# Patient Record
Sex: Female | Born: 1955 | Race: White | Hispanic: No | Smoking: Former smoker
Health system: Southern US, Community
[De-identification: ages and names within clinical notes are randomized; demographics above are authoritative.]

## PROBLEM LIST (undated history)

## (undated) DIAGNOSIS — R7303 Prediabetes: Secondary | ICD-10-CM

## (undated) DIAGNOSIS — L309 Dermatitis, unspecified: Secondary | ICD-10-CM

## (undated) DIAGNOSIS — D510 Vitamin B12 deficiency anemia due to intrinsic factor deficiency: Secondary | ICD-10-CM

## (undated) DIAGNOSIS — Z87448 Personal history of other diseases of urinary system: Secondary | ICD-10-CM

## (undated) DIAGNOSIS — R5382 Chronic fatigue, unspecified: Secondary | ICD-10-CM

## (undated) DIAGNOSIS — G8929 Other chronic pain: Secondary | ICD-10-CM

## (undated) DIAGNOSIS — K219 Gastro-esophageal reflux disease without esophagitis: Secondary | ICD-10-CM

## (undated) DIAGNOSIS — K5730 Diverticulosis of large intestine without perforation or abscess without bleeding: Secondary | ICD-10-CM

## (undated) DIAGNOSIS — M4722 Other spondylosis with radiculopathy, cervical region: Secondary | ICD-10-CM

## (undated) DIAGNOSIS — I10 Essential (primary) hypertension: Secondary | ICD-10-CM

## (undated) DIAGNOSIS — Z803 Family history of malignant neoplasm of breast: Secondary | ICD-10-CM

## (undated) DIAGNOSIS — Z8673 Personal history of transient ischemic attack (TIA), and cerebral infarction without residual deficits: Secondary | ICD-10-CM

## (undated) DIAGNOSIS — Z8719 Personal history of other diseases of the digestive system: Secondary | ICD-10-CM

## (undated) DIAGNOSIS — Z8489 Family history of other specified conditions: Secondary | ICD-10-CM

## (undated) DIAGNOSIS — M797 Fibromyalgia: Secondary | ICD-10-CM

## (undated) DIAGNOSIS — G4733 Obstructive sleep apnea (adult) (pediatric): Secondary | ICD-10-CM

## (undated) DIAGNOSIS — M51379 Other intervertebral disc degeneration, lumbosacral region without mention of lumbar back pain or lower extremity pain: Secondary | ICD-10-CM

## (undated) DIAGNOSIS — R519 Headache, unspecified: Secondary | ICD-10-CM

## (undated) DIAGNOSIS — S46219A Strain of muscle, fascia and tendon of other parts of biceps, unspecified arm, initial encounter: Secondary | ICD-10-CM

## (undated) DIAGNOSIS — M75101 Unspecified rotator cuff tear or rupture of right shoulder, not specified as traumatic: Secondary | ICD-10-CM

## (undated) DIAGNOSIS — M1990 Unspecified osteoarthritis, unspecified site: Secondary | ICD-10-CM

## (undated) DIAGNOSIS — F339 Major depressive disorder, recurrent, unspecified: Secondary | ICD-10-CM

## (undated) DIAGNOSIS — K589 Irritable bowel syndrome without diarrhea: Secondary | ICD-10-CM

## (undated) DIAGNOSIS — G43019 Migraine without aura, intractable, without status migrainosus: Secondary | ICD-10-CM

## (undated) DIAGNOSIS — M5137 Other intervertebral disc degeneration, lumbosacral region: Secondary | ICD-10-CM

## (undated) DIAGNOSIS — E782 Mixed hyperlipidemia: Secondary | ICD-10-CM

## (undated) DIAGNOSIS — M5030 Other cervical disc degeneration, unspecified cervical region: Secondary | ICD-10-CM

## (undated) DIAGNOSIS — F419 Anxiety disorder, unspecified: Secondary | ICD-10-CM

## (undated) DIAGNOSIS — M47817 Spondylosis without myelopathy or radiculopathy, lumbosacral region: Secondary | ICD-10-CM

## (undated) DIAGNOSIS — R51 Headache: Secondary | ICD-10-CM

## (undated) DIAGNOSIS — J309 Allergic rhinitis, unspecified: Secondary | ICD-10-CM

## (undated) HISTORY — DX: Family history of malignant neoplasm of breast: Z80.3

## (undated) HISTORY — DX: Migraine without aura, intractable, without status migrainosus: G43.019

## (undated) HISTORY — DX: Fibromyalgia: M79.7

## (undated) HISTORY — DX: Essential (primary) hypertension: I10

## (undated) HISTORY — PX: BREAST SURGERY: SHX581

## (undated) HISTORY — PX: RADIAL KERATOTOMY: SHX217

## (undated) HISTORY — PX: COLONOSCOPY: SHX174

## (undated) HISTORY — PX: DILATATION & CURRETTAGE/HYSTEROSCOPY WITH RESECTOCOPE: SHX5572

## (undated) HISTORY — PX: RADIAL OPTIC NEUROTOMY: SHX2282

---

## 1999-12-04 ENCOUNTER — Other Ambulatory Visit: Admission: RE | Admit: 1999-12-04 | Discharge: 1999-12-04 | Payer: Self-pay | Admitting: *Deleted

## 2000-02-04 ENCOUNTER — Other Ambulatory Visit: Admission: RE | Admit: 2000-02-04 | Discharge: 2000-02-04 | Payer: Self-pay | Admitting: *Deleted

## 2001-04-22 ENCOUNTER — Other Ambulatory Visit: Admission: RE | Admit: 2001-04-22 | Discharge: 2001-04-22 | Payer: Self-pay | Admitting: *Deleted

## 2002-05-25 ENCOUNTER — Other Ambulatory Visit: Admission: RE | Admit: 2002-05-25 | Discharge: 2002-05-25 | Payer: Self-pay | Admitting: *Deleted

## 2002-08-07 ENCOUNTER — Ambulatory Visit (HOSPITAL_BASED_OUTPATIENT_CLINIC_OR_DEPARTMENT_OTHER): Admission: RE | Admit: 2002-08-07 | Discharge: 2002-08-07 | Payer: Self-pay | Admitting: Internal Medicine

## 2003-07-20 ENCOUNTER — Other Ambulatory Visit: Admission: RE | Admit: 2003-07-20 | Discharge: 2003-07-20 | Payer: Self-pay | Admitting: *Deleted

## 2003-10-21 ENCOUNTER — Encounter: Admission: RE | Admit: 2003-10-21 | Discharge: 2003-10-21 | Payer: Self-pay | Admitting: Neurosurgery

## 2004-08-29 ENCOUNTER — Encounter: Payer: Self-pay | Admitting: Internal Medicine

## 2004-10-09 ENCOUNTER — Ambulatory Visit: Payer: Self-pay | Admitting: Internal Medicine

## 2004-10-10 ENCOUNTER — Ambulatory Visit: Payer: Self-pay | Admitting: Internal Medicine

## 2004-10-16 ENCOUNTER — Ambulatory Visit: Payer: Self-pay | Admitting: Internal Medicine

## 2004-10-29 ENCOUNTER — Ambulatory Visit: Payer: Self-pay | Admitting: Internal Medicine

## 2005-05-30 ENCOUNTER — Encounter: Payer: Self-pay | Admitting: Internal Medicine

## 2005-06-06 ENCOUNTER — Ambulatory Visit: Payer: Self-pay | Admitting: Internal Medicine

## 2005-07-15 ENCOUNTER — Ambulatory Visit: Payer: Self-pay | Admitting: Internal Medicine

## 2005-07-31 ENCOUNTER — Ambulatory Visit: Payer: Self-pay | Admitting: Internal Medicine

## 2005-09-29 DIAGNOSIS — Z87448 Personal history of other diseases of urinary system: Secondary | ICD-10-CM

## 2005-09-29 HISTORY — DX: Personal history of other diseases of urinary system: Z87.448

## 2005-10-10 ENCOUNTER — Inpatient Hospital Stay (HOSPITAL_COMMUNITY): Admission: EM | Admit: 2005-10-10 | Discharge: 2005-10-14 | Payer: Self-pay | Admitting: Emergency Medicine

## 2005-10-13 ENCOUNTER — Ambulatory Visit: Payer: Self-pay | Admitting: Internal Medicine

## 2005-10-22 ENCOUNTER — Ambulatory Visit: Payer: Self-pay | Admitting: Internal Medicine

## 2005-11-01 ENCOUNTER — Ambulatory Visit: Payer: Self-pay | Admitting: Internal Medicine

## 2005-11-11 ENCOUNTER — Encounter: Payer: Self-pay | Admitting: Internal Medicine

## 2005-12-12 ENCOUNTER — Ambulatory Visit: Payer: Self-pay | Admitting: Internal Medicine

## 2005-12-17 ENCOUNTER — Encounter: Payer: Self-pay | Admitting: Internal Medicine

## 2006-01-20 ENCOUNTER — Ambulatory Visit: Payer: Self-pay | Admitting: Internal Medicine

## 2006-01-23 ENCOUNTER — Ambulatory Visit: Payer: Self-pay | Admitting: Internal Medicine

## 2006-02-13 ENCOUNTER — Ambulatory Visit: Payer: Self-pay | Admitting: Internal Medicine

## 2006-03-19 ENCOUNTER — Ambulatory Visit: Payer: Self-pay | Admitting: Internal Medicine

## 2006-04-16 ENCOUNTER — Ambulatory Visit: Payer: Self-pay | Admitting: Internal Medicine

## 2006-06-03 ENCOUNTER — Ambulatory Visit: Payer: Self-pay | Admitting: Internal Medicine

## 2006-06-09 ENCOUNTER — Ambulatory Visit: Payer: Self-pay | Admitting: Internal Medicine

## 2006-06-11 ENCOUNTER — Ambulatory Visit: Payer: Self-pay | Admitting: Cardiology

## 2006-06-19 ENCOUNTER — Ambulatory Visit: Payer: Self-pay | Admitting: Internal Medicine

## 2006-07-24 ENCOUNTER — Ambulatory Visit: Payer: Self-pay | Admitting: Internal Medicine

## 2006-07-24 LAB — CONVERTED CEMR LAB
BUN: 8 mg/dL (ref 6–23)
CO2: 31 meq/L (ref 19–32)
Calcium: 9.6 mg/dL (ref 8.4–10.5)
Chloride: 96 meq/L (ref 96–112)
Creatinine, Ser: 0.8 mg/dL (ref 0.4–1.2)
GFR calc non Af Amer: 80 mL/min
Glomerular Filtration Rate, Af Am: 97 mL/min/{1.73_m2}
Glucose, Bld: 92 mg/dL (ref 70–99)
Potassium: 4.1 meq/L (ref 3.5–5.1)
Sodium: 135 meq/L (ref 135–145)

## 2006-08-28 ENCOUNTER — Ambulatory Visit: Payer: Self-pay | Admitting: Internal Medicine

## 2006-08-28 LAB — CONVERTED CEMR LAB
BUN: 16 mg/dL (ref 6–23)
Basophils Absolute: 0 10*3/uL (ref 0.0–0.1)
Basophils Relative: 0.3 % (ref 0.0–1.0)
CO2: 33 meq/L — ABNORMAL HIGH (ref 19–32)
Calcium: 10.8 mg/dL — ABNORMAL HIGH (ref 8.4–10.5)
Chloride: 97 meq/L (ref 96–112)
Creatinine, Ser: 1 mg/dL (ref 0.4–1.2)
Eosinophil percent: 0.8 % (ref 0.0–5.0)
GFR calc non Af Amer: 62 mL/min
Glomerular Filtration Rate, Af Am: 75 mL/min/{1.73_m2}
Glucose, Bld: 76 mg/dL (ref 70–99)
HCT: 40.2 % (ref 36.0–46.0)
Hemoglobin: 13.7 g/dL (ref 12.0–15.0)
Lymphocytes Relative: 32.2 % (ref 12.0–46.0)
MCHC: 34 g/dL (ref 30.0–36.0)
MCV: 94.5 fL (ref 78.0–100.0)
Monocytes Absolute: 0.5 10*3/uL (ref 0.2–0.7)
Monocytes Relative: 7.8 % (ref 3.0–11.0)
Neutro Abs: 4.1 10*3/uL (ref 1.4–7.7)
Neutrophils Relative %: 58.9 % (ref 43.0–77.0)
Platelets: 454 10*3/uL — ABNORMAL HIGH (ref 150–400)
Potassium: 4.5 meq/L (ref 3.5–5.1)
RBC: 4.25 M/uL (ref 3.87–5.11)
RDW: 13 % (ref 11.5–14.6)
Sodium: 141 meq/L (ref 135–145)
WBC: 6.9 10*3/uL (ref 4.5–10.5)

## 2006-10-30 ENCOUNTER — Ambulatory Visit: Payer: Self-pay | Admitting: Internal Medicine

## 2006-10-30 LAB — CONVERTED CEMR LAB
BUN: 13 mg/dL (ref 6–23)
Basophils Absolute: 0 10*3/uL (ref 0.0–0.1)
Basophils Relative: 1 % (ref 0–1)
Creatinine, Ser: 0.75 mg/dL (ref 0.40–1.20)
Eosinophils Absolute: 0.1 10*3/uL (ref 0.0–0.7)
Eosinophils Relative: 1 % (ref 0–5)
HCT: 38.4 % (ref 36.0–46.0)
Hemoglobin: 12.1 g/dL (ref 12.0–15.0)
Lymphocytes Relative: 34 % (ref 12–46)
Lymphs Abs: 2.3 10*3/uL (ref 0.7–3.3)
MCHC: 31.5 g/dL (ref 30.0–36.0)
MCV: 95.5 fL (ref 78.0–100.0)
Monocytes Absolute: 0.5 10*3/uL (ref 0.2–0.7)
Monocytes Relative: 7 % (ref 3–11)
Neutro Abs: 3.8 10*3/uL (ref 1.7–7.7)
Neutrophils Relative %: 57 % (ref 43–77)
Platelets: 370 10*3/uL (ref 150–400)
Potassium: 4.9 meq/L (ref 3.5–5.3)
RBC: 4.02 M/uL (ref 3.87–5.11)
RDW: 13.5 % (ref 11.5–14.0)
Sodium: 141 meq/L (ref 135–145)
TSH: 1.682 microintl units/mL (ref 0.350–5.50)
WBC: 6.7 10*3/uL (ref 4.0–10.5)

## 2007-01-11 ENCOUNTER — Ambulatory Visit: Payer: Self-pay | Admitting: Internal Medicine

## 2007-03-18 ENCOUNTER — Ambulatory Visit: Payer: Self-pay | Admitting: Internal Medicine

## 2007-03-22 ENCOUNTER — Encounter: Admission: RE | Admit: 2007-03-22 | Discharge: 2007-03-22 | Payer: Self-pay | Admitting: Internal Medicine

## 2007-03-29 DIAGNOSIS — IMO0001 Reserved for inherently not codable concepts without codable children: Secondary | ICD-10-CM

## 2007-03-29 DIAGNOSIS — M797 Fibromyalgia: Secondary | ICD-10-CM

## 2007-03-29 DIAGNOSIS — J309 Allergic rhinitis, unspecified: Secondary | ICD-10-CM

## 2007-03-29 HISTORY — DX: Fibromyalgia: M79.7

## 2007-03-29 HISTORY — DX: Reserved for inherently not codable concepts without codable children: IMO0001

## 2007-03-29 HISTORY — DX: Allergic rhinitis, unspecified: J30.9

## 2007-05-18 ENCOUNTER — Ambulatory Visit: Payer: Self-pay | Admitting: Internal Medicine

## 2007-05-18 DIAGNOSIS — M5030 Other cervical disc degeneration, unspecified cervical region: Secondary | ICD-10-CM

## 2007-05-18 DIAGNOSIS — D518 Other vitamin B12 deficiency anemias: Secondary | ICD-10-CM

## 2007-05-18 HISTORY — DX: Other vitamin B12 deficiency anemias: D51.8

## 2007-05-18 HISTORY — DX: Other cervical disc degeneration, unspecified cervical region: M50.30

## 2007-05-18 LAB — CONVERTED CEMR LAB
Basophils Absolute: 0 10*3/uL (ref 0.0–0.1)
Basophils Relative: 0.2 % (ref 0.0–1.0)
Eosinophils Absolute: 0.2 10*3/uL (ref 0.0–0.6)
Eosinophils Relative: 2.6 % (ref 0.0–5.0)
HCT: 36.1 % (ref 36.0–46.0)
Hemoglobin: 12.5 g/dL (ref 12.0–15.0)
INR: 0.9 (ref 0.0–1.5)
Lymphocytes Relative: 30.8 % (ref 12.0–46.0)
MCHC: 34.7 g/dL (ref 30.0–36.0)
MCV: 95 fL (ref 78.0–100.0)
Monocytes Absolute: 0.4 10*3/uL (ref 0.2–0.7)
Monocytes Relative: 6.2 % (ref 3.0–11.0)
Neutro Abs: 4.4 10*3/uL (ref 1.4–7.7)
Neutrophils Relative %: 60.2 % (ref 43.0–77.0)
Platelets: 377 10*3/uL (ref 150–400)
Prothrombin Time: 12.5 s (ref 11.6–15.2)
RBC: 3.8 M/uL — ABNORMAL LOW (ref 3.87–5.11)
RDW: 13 % (ref 11.5–14.6)
WBC: 7.2 10*3/uL (ref 4.5–10.5)
aPTT: 27 s (ref 24–37)

## 2007-05-19 ENCOUNTER — Encounter: Payer: Self-pay | Admitting: Internal Medicine

## 2007-05-21 ENCOUNTER — Encounter: Payer: Self-pay | Admitting: Internal Medicine

## 2007-06-10 ENCOUNTER — Telehealth (INDEPENDENT_AMBULATORY_CARE_PROVIDER_SITE_OTHER): Payer: Self-pay | Admitting: *Deleted

## 2007-07-19 ENCOUNTER — Telehealth: Payer: Self-pay | Admitting: Internal Medicine

## 2007-07-20 ENCOUNTER — Encounter: Payer: Self-pay | Admitting: Internal Medicine

## 2007-08-31 ENCOUNTER — Encounter: Payer: Self-pay | Admitting: Internal Medicine

## 2007-09-01 ENCOUNTER — Encounter: Payer: Self-pay | Admitting: Internal Medicine

## 2007-09-14 ENCOUNTER — Ambulatory Visit: Payer: Self-pay | Admitting: Internal Medicine

## 2007-09-14 DIAGNOSIS — G4730 Sleep apnea, unspecified: Secondary | ICD-10-CM

## 2007-09-14 DIAGNOSIS — I10 Essential (primary) hypertension: Secondary | ICD-10-CM | POA: Insufficient documentation

## 2007-09-14 DIAGNOSIS — G4700 Insomnia, unspecified: Secondary | ICD-10-CM | POA: Insufficient documentation

## 2007-09-14 HISTORY — DX: Essential (primary) hypertension: I10

## 2007-09-14 LAB — CONVERTED CEMR LAB
BUN: 11 mg/dL (ref 6–23)
Basophils Absolute: 0 10*3/uL (ref 0.0–0.1)
Basophils Relative: 0.7 % (ref 0.0–1.0)
CO2: 32 meq/L (ref 19–32)
Calcium: 11.7 mg/dL — ABNORMAL HIGH (ref 8.4–10.5)
Chloride: 97 meq/L (ref 96–112)
Cholesterol: 217 mg/dL (ref 0–200)
Creatinine, Ser: 0.7 mg/dL (ref 0.4–1.2)
Direct LDL: 122.8 mg/dL
Eosinophils Absolute: 0.2 10*3/uL (ref 0.0–0.6)
Eosinophils Relative: 3 % (ref 0.0–5.0)
Folate: 18.1 ng/mL
GFR calc Af Amer: 113 mL/min
GFR calc non Af Amer: 93 mL/min
Glucose, Bld: 93 mg/dL (ref 70–99)
HCT: 37.4 % (ref 36.0–46.0)
HDL: 71.7 mg/dL (ref >39.0)
Hemoglobin: 12.9 g/dL (ref 12.0–15.0)
Lymphocytes Relative: 40.8 % (ref 12.0–46.0)
MCHC: 34.4 g/dL (ref 30.0–36.0)
MCV: 94.8 fL (ref 78.0–100.0)
Monocytes Absolute: 0.5 10*3/uL (ref 0.2–0.7)
Monocytes Relative: 7.4 % (ref 3.0–11.0)
Neutro Abs: 3.5 10*3/uL (ref 1.4–7.7)
Neutrophils Relative %: 48.1 % (ref 43.0–77.0)
Platelets: 380 10*3/uL (ref 150–400)
Potassium: 5 meq/L (ref 3.5–5.1)
RBC: 3.94 M/uL (ref 3.87–5.11)
RDW: 12.5 % (ref 11.5–14.6)
Sodium: 140 meq/L (ref 135–145)
Total CHOL/HDL Ratio: 3
Triglycerides: 201 mg/dL (ref 0–149)
VLDL: 40 mg/dL (ref 0–40)
Vitamin B-12: 925 pg/mL — ABNORMAL HIGH (ref 211–911)
WBC: 7.1 10*3/uL (ref 4.5–10.5)

## 2007-11-03 ENCOUNTER — Telehealth: Payer: Self-pay | Admitting: Internal Medicine

## 2007-11-18 ENCOUNTER — Ambulatory Visit: Payer: Self-pay | Admitting: Internal Medicine

## 2007-11-18 DIAGNOSIS — E785 Hyperlipidemia, unspecified: Secondary | ICD-10-CM

## 2007-11-18 HISTORY — DX: Hypercalcemia: E83.52

## 2007-11-19 ENCOUNTER — Encounter: Payer: Self-pay | Admitting: Internal Medicine

## 2007-11-23 ENCOUNTER — Encounter: Payer: Self-pay | Admitting: Internal Medicine

## 2007-11-23 LAB — CONVERTED CEMR LAB
Albumin, U: DETECTED %
Alpha 1, Urine: DETECTED % — AB
Alpha 2, Urine: DETECTED % — AB
Beta, Urine: DETECTED % — AB
Free Kappa Lt Chains,Ur: 0.22 mg/dL (ref 0.04–1.51)
Free Lambda Lt Chains,Ur: <0.07 mg/dL (ref 0.08–1.01)
Gamma Globulin, Urine: DETECTED % — AB
Time: 24
Total Protein, Urine-Ur/day: 93 mg/24hr (ref 10–140)
Volume, Urine: 3000 mL

## 2007-11-25 ENCOUNTER — Encounter: Payer: Self-pay | Admitting: Internal Medicine

## 2007-11-25 LAB — CONVERTED CEMR LAB
Albumin ELP: 63.3 % (ref 55.8–66.1)
Alpha-1-Globulin: 3.1 % (ref 2.9–4.9)
Alpha-2-Globulin: 10.4 % (ref 7.1–11.8)
Beta Globulin: 6.2 % (ref 4.7–7.2)
Calcium, Total (PTH): 10.8 mg/dL — ABNORMAL HIGH (ref 8.4–10.5)
Cortisol, Plasma: 11.5 ug/dL
Gamma Globulin: 12.9 % (ref 11.1–18.8)
PTH: 46.7 pg/mL (ref 14.0–72.0)
Total Protein, Serum Electrophoresis: 8.4 g/dL — ABNORMAL HIGH (ref 6.0–8.3)

## 2007-12-16 ENCOUNTER — Encounter: Admission: RE | Admit: 2007-12-16 | Discharge: 2007-12-16 | Payer: Self-pay | Admitting: Internal Medicine

## 2007-12-21 ENCOUNTER — Telehealth: Payer: Self-pay | Admitting: Internal Medicine

## 2007-12-21 ENCOUNTER — Ambulatory Visit: Payer: Self-pay | Admitting: Internal Medicine

## 2007-12-23 ENCOUNTER — Ambulatory Visit: Payer: Self-pay | Admitting: Internal Medicine

## 2007-12-23 DIAGNOSIS — R809 Proteinuria, unspecified: Secondary | ICD-10-CM | POA: Insufficient documentation

## 2007-12-23 HISTORY — DX: Proteinuria, unspecified: R80.9

## 2007-12-23 LAB — CONVERTED CEMR LAB
Albumin ELP: 61.4 % (ref 55.8–66.1)
Alpha-1-Globulin: 3.3 % (ref 2.9–4.9)
Alpha-2-Globulin: 11.2 % (ref 7.1–11.8)
Beta Globulin: 6.2 % (ref 4.7–7.2)
Calcium: 9.8 mg/dL (ref 8.4–10.5)
Gamma Globulin: 12.6 % (ref 11.1–18.8)
Total Protein, Serum Electrophoresis: 7.5 g/dL (ref 6.0–8.3)

## 2007-12-27 ENCOUNTER — Encounter: Payer: Self-pay | Admitting: Internal Medicine

## 2007-12-27 LAB — CONVERTED CEMR LAB: Protein, Ur: 210 mg/24hr — ABNORMAL HIGH (ref 50–100)

## 2008-01-04 ENCOUNTER — Telehealth: Payer: Self-pay | Admitting: *Deleted

## 2008-01-06 ENCOUNTER — Ambulatory Visit: Payer: Self-pay | Admitting: Internal Medicine

## 2008-01-06 DIAGNOSIS — E1165 Type 2 diabetes mellitus with hyperglycemia: Secondary | ICD-10-CM

## 2008-02-03 ENCOUNTER — Ambulatory Visit: Payer: Self-pay | Admitting: Internal Medicine

## 2008-02-03 DIAGNOSIS — J45901 Unspecified asthma with (acute) exacerbation: Secondary | ICD-10-CM

## 2008-02-03 LAB — CONVERTED CEMR LAB
BUN: 7 mg/dL (ref 6–23)
CO2: 31 meq/L (ref 19–32)
Calcium: 9.8 mg/dL (ref 8.4–10.5)
Chloride: 108 meq/L (ref 96–112)
Creatinine, Ser: 0.7 mg/dL (ref 0.4–1.2)
GFR calc Af Amer: 113 mL/min
GFR calc non Af Amer: 93 mL/min
Glucose, Bld: 82 mg/dL (ref 70–99)
Hgb A1c MFr Bld: 5.4 % (ref 4.6–6.0)
IgE (Immunoglobulin E), Serum: 53.3 [IU]/mL
Potassium: 4.2 meq/L (ref 3.5–5.1)
Sodium: 142 meq/L (ref 135–145)

## 2008-02-10 ENCOUNTER — Telehealth: Payer: Self-pay | Admitting: *Deleted

## 2008-02-23 ENCOUNTER — Encounter: Payer: Self-pay | Admitting: Internal Medicine

## 2008-02-23 ENCOUNTER — Ambulatory Visit (HOSPITAL_BASED_OUTPATIENT_CLINIC_OR_DEPARTMENT_OTHER): Admission: RE | Admit: 2008-02-23 | Discharge: 2008-02-23 | Payer: Self-pay | Admitting: Internal Medicine

## 2008-03-02 ENCOUNTER — Ambulatory Visit: Payer: Self-pay | Admitting: Internal Medicine

## 2008-03-02 DIAGNOSIS — R0789 Other chest pain: Secondary | ICD-10-CM

## 2008-03-09 ENCOUNTER — Ambulatory Visit: Payer: Self-pay | Admitting: Pulmonary Disease

## 2008-03-14 ENCOUNTER — Ambulatory Visit: Payer: Self-pay

## 2008-03-14 ENCOUNTER — Encounter: Payer: Self-pay | Admitting: Internal Medicine

## 2008-03-14 ENCOUNTER — Telehealth: Payer: Self-pay | Admitting: *Deleted

## 2008-03-17 ENCOUNTER — Encounter: Payer: Self-pay | Admitting: Internal Medicine

## 2008-03-17 ENCOUNTER — Telehealth: Payer: Self-pay | Admitting: Internal Medicine

## 2008-03-29 ENCOUNTER — Ambulatory Visit: Payer: Self-pay | Admitting: Internal Medicine

## 2008-04-03 ENCOUNTER — Encounter: Payer: Self-pay | Admitting: Internal Medicine

## 2008-04-03 ENCOUNTER — Telehealth: Payer: Self-pay | Admitting: Internal Medicine

## 2008-04-28 ENCOUNTER — Ambulatory Visit: Payer: Self-pay | Admitting: Internal Medicine

## 2008-05-08 ENCOUNTER — Telehealth: Payer: Self-pay | Admitting: Internal Medicine

## 2008-05-26 ENCOUNTER — Ambulatory Visit: Payer: Self-pay | Admitting: Internal Medicine

## 2008-05-26 DIAGNOSIS — M545 Low back pain, unspecified: Secondary | ICD-10-CM

## 2008-05-26 HISTORY — DX: Low back pain, unspecified: M54.50

## 2008-07-04 ENCOUNTER — Ambulatory Visit: Payer: Self-pay | Admitting: Internal Medicine

## 2008-07-04 DIAGNOSIS — M25559 Pain in unspecified hip: Secondary | ICD-10-CM

## 2008-07-04 DIAGNOSIS — R5383 Other fatigue: Secondary | ICD-10-CM

## 2008-07-04 DIAGNOSIS — R5381 Other malaise: Secondary | ICD-10-CM | POA: Insufficient documentation

## 2008-07-04 HISTORY — DX: Pain in unspecified hip: M25.559

## 2008-07-04 HISTORY — DX: Other malaise: R53.81

## 2008-07-04 LAB — CONVERTED CEMR LAB
BUN: 12 mg/dL (ref 6–23)
Basophils Absolute: 0.1 10*3/uL (ref 0.0–0.1)
Basophils Relative: 1.6 % (ref 0.0–3.0)
Bilirubin Urine: NEGATIVE
CO2: 32 meq/L (ref 19–32)
Calcium: 10.3 mg/dL (ref 8.4–10.5)
Chloride: 106 meq/L (ref 96–112)
Creatinine, Ser: 0.8 mg/dL (ref 0.4–1.2)
Eosinophils Absolute: 0.1 10*3/uL (ref 0.0–0.7)
Eosinophils Relative: 1.7 % (ref 0.0–5.0)
GFR calc Af Amer: 96 mL/min
GFR calc non Af Amer: 80 mL/min
Glucose, Bld: 103 mg/dL — ABNORMAL HIGH (ref 70–99)
Glucose, Urine, Semiquant: NEGATIVE
HCT: 39.5 % (ref 36.0–46.0)
Hemoglobin: 13.5 g/dL (ref 12.0–15.0)
Ketones, urine, test strip: NEGATIVE
Lymphocytes Relative: 35.6 % (ref 12.0–46.0)
MCHC: 34.2 g/dL (ref 30.0–36.0)
MCV: 94.5 fL (ref 78.0–100.0)
Monocytes Absolute: 0.4 10*3/uL (ref 0.1–1.0)
Monocytes Relative: 5.5 % (ref 3.0–12.0)
Neutro Abs: 3.8 10*3/uL (ref 1.4–7.7)
Neutrophils Relative %: 55.6 % (ref 43.0–77.0)
Nitrite: NEGATIVE
Platelets: 386 10*3/uL (ref 150–400)
Potassium: 4.6 meq/L (ref 3.5–5.1)
RBC: 4.19 M/uL (ref 3.87–5.11)
RDW: 13.4 % (ref 11.5–14.6)
Sodium: 147 meq/L — ABNORMAL HIGH (ref 135–145)
Specific Gravity, Urine: 1.015
TSH: 1.29 microintl units/mL (ref 0.35–5.50)
Urobilinogen, UA: 0.2
WBC Urine, dipstick: NEGATIVE
WBC: 6.8 10*3/uL (ref 4.5–10.5)
pH: 6.5

## 2008-08-02 ENCOUNTER — Ambulatory Visit: Payer: Self-pay | Admitting: Internal Medicine

## 2008-08-02 DIAGNOSIS — J679 Hypersensitivity pneumonitis due to unspecified organic dust: Secondary | ICD-10-CM | POA: Insufficient documentation

## 2008-08-02 LAB — CONVERTED CEMR LAB: IgE (Immunoglobulin E), Serum: 59.5 intl units/mL (ref 0.0–180.0)

## 2008-08-03 ENCOUNTER — Telehealth: Payer: Self-pay | Admitting: Internal Medicine

## 2008-08-11 ENCOUNTER — Telehealth: Payer: Self-pay | Admitting: Internal Medicine

## 2008-08-30 ENCOUNTER — Ambulatory Visit: Payer: Self-pay | Admitting: Internal Medicine

## 2008-08-30 DIAGNOSIS — B379 Candidiasis, unspecified: Secondary | ICD-10-CM | POA: Insufficient documentation

## 2008-08-30 LAB — CONVERTED CEMR LAB: Hgb A1c MFr Bld: 5.6 % (ref 4.6–6.0)

## 2008-09-07 ENCOUNTER — Telehealth: Payer: Self-pay | Admitting: Internal Medicine

## 2008-09-18 ENCOUNTER — Ambulatory Visit: Payer: Self-pay | Admitting: Internal Medicine

## 2008-09-18 DIAGNOSIS — H6120 Impacted cerumen, unspecified ear: Secondary | ICD-10-CM | POA: Insufficient documentation

## 2008-10-16 ENCOUNTER — Ambulatory Visit: Payer: Self-pay | Admitting: Internal Medicine

## 2008-11-22 ENCOUNTER — Ambulatory Visit: Payer: Self-pay | Admitting: Internal Medicine

## 2008-11-23 ENCOUNTER — Encounter: Payer: Self-pay | Admitting: Internal Medicine

## 2008-11-23 LAB — CONVERTED CEMR LAB
Calcium, Total (PTH): 10.4 mg/dL (ref 8.4–10.5)
Calcium: 10.4 mg/dL (ref 8.4–10.5)
PTH: 40.6 pg/mL (ref 14.0–72.0)
Phosphorus: 3.8 mg/dL (ref 2.3–4.6)
Vit D, 25-Hydroxy: 68 ng/mL (ref 30–89)

## 2008-12-20 ENCOUNTER — Ambulatory Visit: Payer: Self-pay | Admitting: Internal Medicine

## 2008-12-20 DIAGNOSIS — M6281 Muscle weakness (generalized): Secondary | ICD-10-CM | POA: Insufficient documentation

## 2008-12-20 HISTORY — DX: Muscle weakness (generalized): M62.81

## 2009-01-24 ENCOUNTER — Ambulatory Visit: Payer: Self-pay | Admitting: Internal Medicine

## 2009-01-24 LAB — CONVERTED CEMR LAB
BUN: 15 mg/dL (ref 6–23)
CO2: 33 meq/L — ABNORMAL HIGH (ref 19–32)
Calcium: 10.2 mg/dL (ref 8.4–10.5)
Chloride: 104 meq/L (ref 96–112)
Creatinine, Ser: 0.7 mg/dL (ref 0.4–1.2)
GFR calc non Af Amer: 92.82 mL/min (ref >60)
Glucose, Bld: 69 mg/dL — ABNORMAL LOW (ref 70–99)
Hgb A1c MFr Bld: 5.8 % (ref 4.6–6.5)
Potassium: 4.6 meq/L (ref 3.5–5.1)
Sodium: 143 meq/L (ref 135–145)

## 2009-02-06 ENCOUNTER — Encounter: Admission: RE | Admit: 2009-02-06 | Discharge: 2009-02-06 | Payer: Self-pay | Admitting: Internal Medicine

## 2009-02-07 DIAGNOSIS — R9089 Other abnormal findings on diagnostic imaging of central nervous system: Secondary | ICD-10-CM | POA: Insufficient documentation

## 2009-02-07 HISTORY — DX: Other abnormal findings on diagnostic imaging of central nervous system: R90.89

## 2009-03-05 ENCOUNTER — Encounter: Payer: Self-pay | Admitting: Internal Medicine

## 2009-03-06 ENCOUNTER — Encounter: Payer: Self-pay | Admitting: Internal Medicine

## 2009-03-12 ENCOUNTER — Encounter: Payer: Self-pay | Admitting: Internal Medicine

## 2009-03-13 ENCOUNTER — Ambulatory Visit: Payer: Self-pay | Admitting: Internal Medicine

## 2009-03-13 DIAGNOSIS — R55 Syncope and collapse: Secondary | ICD-10-CM

## 2009-03-13 HISTORY — DX: Syncope and collapse: R55

## 2009-03-13 LAB — CONVERTED CEMR LAB
Free T4: 0.7 ng/dL (ref 0.6–1.6)
T3, Free: 3.6 pg/mL (ref 2.3–4.2)
TSH: 0.18 microintl units/mL — ABNORMAL LOW (ref 0.35–5.50)

## 2009-04-13 ENCOUNTER — Ambulatory Visit: Payer: Self-pay | Admitting: Internal Medicine

## 2009-04-13 DIAGNOSIS — F458 Other somatoform disorders: Secondary | ICD-10-CM

## 2009-04-13 DIAGNOSIS — D1779 Benign lipomatous neoplasm of other sites: Secondary | ICD-10-CM | POA: Insufficient documentation

## 2009-04-13 HISTORY — DX: Other somatoform disorders: F45.8

## 2009-04-13 HISTORY — DX: Benign lipomatous neoplasm of other sites: D17.79

## 2009-04-18 ENCOUNTER — Encounter: Payer: Self-pay | Admitting: Internal Medicine

## 2009-04-19 ENCOUNTER — Encounter: Admission: RE | Admit: 2009-04-19 | Discharge: 2009-05-22 | Payer: Self-pay | Admitting: Internal Medicine

## 2009-04-20 ENCOUNTER — Encounter: Payer: Self-pay | Admitting: Internal Medicine

## 2009-05-16 ENCOUNTER — Ambulatory Visit: Payer: Self-pay | Admitting: Internal Medicine

## 2009-05-16 DIAGNOSIS — E039 Hypothyroidism, unspecified: Secondary | ICD-10-CM

## 2009-05-16 HISTORY — DX: Hypothyroidism, unspecified: E03.9

## 2009-05-16 LAB — CONVERTED CEMR LAB
Free T4: 0.5 ng/dL — ABNORMAL LOW (ref 0.6–1.6)
T3, Free: 2.4 pg/mL (ref 2.3–4.2)
TSH: 2.77 microintl units/mL (ref 0.35–5.50)

## 2009-05-22 ENCOUNTER — Encounter: Payer: Self-pay | Admitting: Internal Medicine

## 2009-05-22 ENCOUNTER — Telehealth: Payer: Self-pay | Admitting: Internal Medicine

## 2009-06-15 ENCOUNTER — Telehealth: Payer: Self-pay | Admitting: *Deleted

## 2009-06-20 IMAGING — NM NM TUMOR LOCAL/TRACER DISTR WITH SPECT
1 series · 6 of 6 positions shown · non-contrast
Comparison: None.

CLINICAL DATA: Elevated parathyroid hormone.  Evaluate for parathyroid adenoma. 
 NM PARATHYROID SCINTIGRAPHY AND SPECT IMAGING:
TECHNIQUE: Following intravenous administration of radiopharmaceutical, early and 2-hour delayed planar images were obtained in the anterior projection.  Delayed triplanar SPECT images were also obtained at 2 hours.  
 Radiopharmaceutical:  24.8 mCi 3c-00m sestamibi IV

[(hospital) non circ ect combin · 3.19mm/px · 6 of 120 frames shown]
[frame 11/120  full-range]
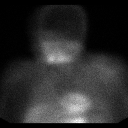
[frame 31/120  full-range]
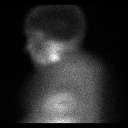
[frame 51/120  full-range]
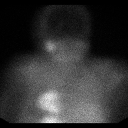
[frame 71/120  full-range]
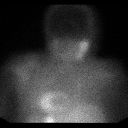
[frame 91/120  full-range]
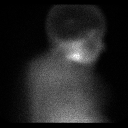
[frame 111/120  full-range]
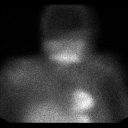

[6 of 6 positions shown; findings below may reference images not displayed]

FINDINGS: No abnormal uptake at either delayed or SPECT imaging to suggest parathyroid adenoma.
IMPRESSION: No evidence of parathyroid adenoma.

## 2009-06-21 ENCOUNTER — Ambulatory Visit: Payer: Self-pay | Admitting: Internal Medicine

## 2009-06-27 ENCOUNTER — Ambulatory Visit: Payer: Self-pay | Admitting: Internal Medicine

## 2009-06-27 DIAGNOSIS — N3010 Interstitial cystitis (chronic) without hematuria: Secondary | ICD-10-CM

## 2009-06-27 HISTORY — DX: Interstitial cystitis (chronic) without hematuria: N30.10

## 2009-06-28 ENCOUNTER — Telehealth: Payer: Self-pay | Admitting: Internal Medicine

## 2009-07-02 ENCOUNTER — Telehealth: Payer: Self-pay | Admitting: Internal Medicine

## 2009-07-03 ENCOUNTER — Encounter: Payer: Self-pay | Admitting: Internal Medicine

## 2009-07-04 ENCOUNTER — Telehealth: Payer: Self-pay | Admitting: Internal Medicine

## 2009-07-05 ENCOUNTER — Telehealth: Payer: Self-pay | Admitting: Internal Medicine

## 2009-07-23 ENCOUNTER — Encounter (INDEPENDENT_AMBULATORY_CARE_PROVIDER_SITE_OTHER): Payer: Self-pay | Admitting: *Deleted

## 2009-07-25 ENCOUNTER — Encounter (INDEPENDENT_AMBULATORY_CARE_PROVIDER_SITE_OTHER): Payer: Self-pay | Admitting: *Deleted

## 2009-08-01 ENCOUNTER — Encounter (INDEPENDENT_AMBULATORY_CARE_PROVIDER_SITE_OTHER): Payer: Self-pay | Admitting: *Deleted

## 2009-08-09 ENCOUNTER — Encounter (INDEPENDENT_AMBULATORY_CARE_PROVIDER_SITE_OTHER): Payer: Self-pay | Admitting: *Deleted

## 2009-08-14 ENCOUNTER — Ambulatory Visit: Payer: Self-pay | Admitting: Internal Medicine

## 2009-08-14 DIAGNOSIS — K589 Irritable bowel syndrome without diarrhea: Secondary | ICD-10-CM

## 2009-08-14 HISTORY — DX: Irritable bowel syndrome, unspecified: K58.9

## 2009-09-25 ENCOUNTER — Ambulatory Visit: Payer: Self-pay | Admitting: Internal Medicine

## 2009-09-27 ENCOUNTER — Ambulatory Visit: Payer: Self-pay | Admitting: Internal Medicine

## 2009-11-05 ENCOUNTER — Ambulatory Visit: Payer: Self-pay | Admitting: Internal Medicine

## 2009-11-05 LAB — CONVERTED CEMR LAB: Hgb A1c MFr Bld: 5.7 % (ref 4.6–6.5)

## 2009-11-07 ENCOUNTER — Encounter: Payer: Self-pay | Admitting: Internal Medicine

## 2009-11-07 ENCOUNTER — Encounter: Admission: RE | Admit: 2009-11-07 | Discharge: 2009-12-20 | Payer: Self-pay | Admitting: Internal Medicine

## 2009-11-12 ENCOUNTER — Telehealth: Payer: Self-pay | Admitting: Internal Medicine

## 2009-11-19 ENCOUNTER — Telehealth: Payer: Self-pay | Admitting: Internal Medicine

## 2009-11-26 ENCOUNTER — Encounter: Payer: Self-pay | Admitting: Internal Medicine

## 2009-12-05 ENCOUNTER — Encounter: Payer: Self-pay | Admitting: Internal Medicine

## 2009-12-07 ENCOUNTER — Encounter: Payer: Self-pay | Admitting: Internal Medicine

## 2009-12-14 ENCOUNTER — Telehealth: Payer: Self-pay | Admitting: Internal Medicine

## 2009-12-31 ENCOUNTER — Ambulatory Visit: Payer: Self-pay | Admitting: Family Medicine

## 2010-01-01 ENCOUNTER — Encounter: Payer: Self-pay | Admitting: Internal Medicine

## 2010-01-08 ENCOUNTER — Ambulatory Visit: Payer: Self-pay | Admitting: Internal Medicine

## 2010-01-08 LAB — CONVERTED CEMR LAB
Bilirubin Urine: NEGATIVE
Blood in Urine, dipstick: NEGATIVE
Calcium: 10 mg/dL (ref 8.4–10.5)
Cholesterol, target level: 200 mg/dL
Free T4: 0.8 ng/dL (ref 0.6–1.6)
Glucose, Urine, Semiquant: NEGATIVE
HDL goal, serum: 40 mg/dL
Ketones, urine, test strip: NEGATIVE
LDL Goal: 100 mg/dL
Magnesium: 2.1 mg/dL (ref 1.5–2.5)
Nitrite: NEGATIVE
Phosphorus: 4.3 mg/dL (ref 2.3–4.6)
Specific Gravity, Urine: 1.02
T3, Free: 2.8 pg/mL (ref 2.3–4.2)
TSH: 1.12 microintl units/mL (ref 0.35–5.50)
Urobilinogen, UA: 0.2
WBC Urine, dipstick: NEGATIVE
pH: 7

## 2010-03-04 ENCOUNTER — Encounter (INDEPENDENT_AMBULATORY_CARE_PROVIDER_SITE_OTHER): Payer: Self-pay | Admitting: Family Medicine

## 2010-03-04 ENCOUNTER — Ambulatory Visit: Payer: Self-pay | Admitting: Internal Medicine

## 2010-03-04 LAB — CONVERTED CEMR LAB
Basophils Absolute: 0.1 10*3/uL (ref 0.0–0.1)
Lymphocytes Relative: 32 % (ref 12–46)
Lymphs Abs: 2.4 10*3/uL (ref 0.7–4.0)
Neutro Abs: 4.3 10*3/uL (ref 1.7–7.7)
Neutrophils Relative %: 56 % (ref 43–77)
Platelets: 405 10*3/uL — ABNORMAL HIGH (ref 150–400)
RDW: 14.5 % (ref 11.5–15.5)
WBC: 7.6 10*3/uL (ref 4.0–10.5)

## 2010-03-12 ENCOUNTER — Ambulatory Visit: Payer: Self-pay | Admitting: Internal Medicine

## 2010-03-15 ENCOUNTER — Ambulatory Visit: Payer: Self-pay | Admitting: Internal Medicine

## 2010-03-21 ENCOUNTER — Ambulatory Visit: Payer: Self-pay | Admitting: Internal Medicine

## 2010-03-22 ENCOUNTER — Encounter: Admission: RE | Admit: 2010-03-22 | Discharge: 2010-03-22 | Payer: Self-pay | Admitting: Internal Medicine

## 2010-04-08 ENCOUNTER — Encounter: Payer: Self-pay | Admitting: Internal Medicine

## 2010-04-17 ENCOUNTER — Encounter: Payer: Self-pay | Admitting: Internal Medicine

## 2010-04-25 ENCOUNTER — Encounter: Payer: Self-pay | Admitting: Internal Medicine

## 2010-05-03 ENCOUNTER — Telehealth (INDEPENDENT_AMBULATORY_CARE_PROVIDER_SITE_OTHER): Payer: Self-pay | Admitting: *Deleted

## 2010-05-23 ENCOUNTER — Encounter: Payer: Self-pay | Admitting: Internal Medicine

## 2010-05-28 ENCOUNTER — Ambulatory Visit: Payer: Self-pay | Admitting: Internal Medicine

## 2010-05-28 DIAGNOSIS — N760 Acute vaginitis: Secondary | ICD-10-CM | POA: Insufficient documentation

## 2010-06-24 ENCOUNTER — Telehealth: Payer: Self-pay | Admitting: Internal Medicine

## 2010-07-25 ENCOUNTER — Telehealth: Payer: Self-pay | Admitting: Internal Medicine

## 2010-08-13 ENCOUNTER — Emergency Department (HOSPITAL_COMMUNITY): Admission: EM | Admit: 2010-08-13 | Discharge: 2010-08-13 | Payer: Self-pay | Admitting: Emergency Medicine

## 2010-08-14 ENCOUNTER — Telehealth (INDEPENDENT_AMBULATORY_CARE_PROVIDER_SITE_OTHER): Payer: Self-pay | Admitting: *Deleted

## 2010-08-16 ENCOUNTER — Ambulatory Visit: Payer: Self-pay | Admitting: Family Medicine

## 2010-08-16 ENCOUNTER — Inpatient Hospital Stay (HOSPITAL_COMMUNITY): Admission: EM | Admit: 2010-08-16 | Discharge: 2010-08-22 | Payer: Self-pay | Admitting: Internal Medicine

## 2010-08-16 ENCOUNTER — Ambulatory Visit: Payer: Self-pay | Admitting: Cardiology

## 2010-08-16 DIAGNOSIS — R1032 Left lower quadrant pain: Secondary | ICD-10-CM

## 2010-08-16 DIAGNOSIS — Z8719 Personal history of other diseases of the digestive system: Secondary | ICD-10-CM

## 2010-08-16 HISTORY — DX: Personal history of other diseases of the digestive system: Z87.19

## 2010-08-16 LAB — CONVERTED CEMR LAB
BUN: 8 mg/dL (ref 6–23)
Basophils Relative: 0.4 % (ref 0.0–3.0)
Chloride: 97 meq/L (ref 96–112)
Eosinophils Absolute: 0.1 10*3/uL (ref 0.0–0.7)
Eosinophils Relative: 1.3 % (ref 0.0–5.0)
GFR calc non Af Amer: 95.42 mL/min (ref >60)
Glucose, Bld: 93 mg/dL (ref 70–99)
HCT: 35.1 % — ABNORMAL LOW (ref 36.0–46.0)
Lymphocytes Relative: 21.5 % (ref 12.0–46.0)
MCV: 94.3 fL (ref 78.0–100.0)
Monocytes Absolute: 0.5 10*3/uL (ref 0.1–1.0)
Neutrophils Relative %: 70.5 % (ref 43.0–77.0)
Platelets: 426 10*3/uL — ABNORMAL HIGH (ref 150.0–400.0)
Potassium: 4.1 meq/L (ref 3.5–5.1)
RBC: 3.72 M/uL — ABNORMAL LOW (ref 3.87–5.11)
RDW: 13 % (ref 11.5–14.6)
Sodium: 137 meq/L (ref 135–145)
WBC: 7.7 10*3/uL (ref 4.5–10.5)

## 2010-08-27 ENCOUNTER — Ambulatory Visit: Payer: Self-pay | Admitting: Internal Medicine

## 2010-08-27 DIAGNOSIS — K5732 Diverticulitis of large intestine without perforation or abscess without bleeding: Secondary | ICD-10-CM

## 2010-08-27 LAB — CONVERTED CEMR LAB
Basophils Relative: 0.7 % (ref 0.0–3.0)
CO2: 32 meq/L (ref 19–32)
Chloride: 99 meq/L (ref 96–112)
Creatinine, Ser: 0.6 mg/dL (ref 0.4–1.2)
Eosinophils Absolute: 0.1 10*3/uL (ref 0.0–0.7)
Eosinophils Relative: 1.3 % (ref 0.0–5.0)
HCT: 37.2 % (ref 36.0–46.0)
Hgb A1c MFr Bld: 5.6 % (ref 4.6–6.5)
Lymphs Abs: 2.1 10*3/uL (ref 0.7–4.0)
MCHC: 33.8 g/dL (ref 30.0–36.0)
MCV: 94.3 fL (ref 78.0–100.0)
Monocytes Absolute: 0.5 10*3/uL (ref 0.1–1.0)
Neutro Abs: 5.6 10*3/uL (ref 1.4–7.7)
Potassium: 5 meq/L (ref 3.5–5.1)
RBC: 3.94 M/uL (ref 3.87–5.11)
WBC: 8.4 10*3/uL (ref 4.5–10.5)

## 2010-09-04 ENCOUNTER — Telehealth: Payer: Self-pay | Admitting: Internal Medicine

## 2010-09-06 ENCOUNTER — Ambulatory Visit: Payer: Self-pay | Admitting: Internal Medicine

## 2010-10-04 ENCOUNTER — Ambulatory Visit
Admission: RE | Admit: 2010-10-04 | Discharge: 2010-10-04 | Payer: Self-pay | Source: Home / Self Care | Attending: Internal Medicine | Admitting: Internal Medicine

## 2010-10-04 ENCOUNTER — Encounter: Payer: Self-pay | Admitting: Internal Medicine

## 2010-10-04 DIAGNOSIS — K5901 Slow transit constipation: Secondary | ICD-10-CM

## 2010-10-04 HISTORY — DX: Slow transit constipation: K59.01

## 2010-10-04 LAB — CONVERTED CEMR LAB
AST: 32 units/L (ref 0–37)
Albumin: 4.9 g/dL (ref 3.5–5.2)
Basophils Absolute: 0 10*3/uL (ref 0.0–0.1)
Basophils Relative: 1 % (ref 0–1)
Bilirubin, Direct: 0.1 mg/dL (ref 0.0–0.3)
Eosinophils Absolute: 0.1 10*3/uL (ref 0.0–0.7)
Eosinophils Relative: 1 % (ref 0–5)
HCT: 40.4 % (ref 36.0–46.0)
MCV: 93.5 fL (ref 78.0–100.0)
Neutrophils Relative %: 56 % (ref 43–77)
Platelets: 358 10*3/uL (ref 150–400)
RDW: 14 % (ref 11.5–15.5)
Total Bilirubin: 0.3 mg/dL (ref 0.3–1.2)
WBC: 8.7 10*3/uL (ref 4.0–10.5)

## 2010-10-11 ENCOUNTER — Encounter: Payer: Self-pay | Admitting: Internal Medicine

## 2010-10-20 ENCOUNTER — Encounter: Payer: Self-pay | Admitting: Internal Medicine

## 2010-10-29 NOTE — Assessment & Plan Note (Signed)
Summary: 2 MONTH ROV/NJR   Vital Signs:  Patient profile:   55 year old female Height:      60 inches Weight:      170 pounds BMI:     33.32 Temp:     98.2 degrees F oral Pulse rate:   76 / minute Resp:     14 per minute BP sitting:   144 / 90  (left arm)  Vitals Entered By: Willy Eddy, LPN (January 08, 2010 2:50 PM)  Nutrition Counseling: Patient's BMI is greater than 25 and therefore counseled on weight management options. CC: roa, Lipid Management, Hypertension Management   CC:  roa, Lipid Management, and Hypertension Management.  History of Present Illness: increased fatigue and hypersonolence has been in PT and this has helped mobility she is followed for Fibromyalgia and chronic fatique persistant UTI symptoms   Hypertension History:      She complains of peripheral edema and neurologic problems, but denies headache, chest pain, palpitations, dyspnea with exertion, orthopnea, PND, visual symptoms, syncope, and side effects from treatment.        Positive major cardiovascular risk factors include diabetes, hyperlipidemia, and hypertension.  Negative major cardiovascular risk factors include female age less than 43 years old and non-tobacco-user status.        Further assessment for target organ damage reveals no history of ASHD, stroke/TIA, or peripheral vascular disease.    Lipid Management History:      Positive NCEP/ATP III risk factors include diabetes and hypertension.  Negative NCEP/ATP III risk factors include female age less than 60 years old, HDL cholesterol greater than 60, non-tobacco-user status, no ASHD (atherosclerotic heart disease), no prior stroke/TIA, no peripheral vascular disease, and no history of aortic aneurysm.      Preventive Screening-Counseling & Management  Alcohol-Tobacco     Smoking Status: quit  Problems Prior to Update: 1)  Cystitis, Acute, Recurrent  (ICD-595.0) 2)  Acute Frontal Sinusitis  (ICD-461.1) 3)  Cystitis, Chronic  Interstitial  (ICD-595.1) 4)  Unspecified Hypothyroidism  (ICD-244.9) 5)  Lipoma of Other Specified Sites  (ICD-214.8) 6)  Bruxism  (ICD-306.8) 7)  Syncope  (ICD-780.2) 8)  Mri, Brain, Abnormal  (ICD-794.09) 9)  Muscle Weakness (GENERALIZED)  (ICD-728.87) 10)  Cerumen Impaction, Bilateral  (ICD-380.4) 11)  Candidiasis of Unspecified Site  (ICD-112.9) 12)  Unspecified Allergic Alveolitis and Pneumonitis  (ICD-495.9) 13)  Hip Pain, Left, Chronic  (ICD-719.45) 14)  Malaise and Fatigue  (ICD-780.79) 15)  Irritable Bowel Syndrome  (ICD-564.1) 16)  Low Back Pain  (ICD-724.2) 17)  Chest Pain, Atypical  (ICD-786.59) 18)  Family History of Cad Female 1st Degree Relative <50  (ICD-V17.3) 19)  Family History of Cad Female 1st Degree Relative <60  (ICD-V16.49) 20)  Extrinsic Asthma, With Exacerbation  (ICD-493.02) 21)  Diabetes Mellitus, Type II, Uncontrolled  (ICD-250.02) 22)  Proteinuria  (ICD-791.0) 23)  Hyperlipidemia  (ICD-272.4) 24)  Hypercalcemia  (ICD-275.42) 25)  Insomnia With Sleep Apnea Unspecified  (ICD-780.51) 26)  Hypertension  (ICD-401.9) 27)  Anemia, B12 Deficiency  (ICD-281.1) 28)  Degenerative Disc Disease, 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.4) 29)  Depression  (ICD-311) 30)  Fibromyalgia  (ICD-729.1) 31)  Allergic Rhinitis  (ICD-477.9)  Current Problems (verified): 1)  Cystitis, Acute, Recurrent  (ICD-595.0) 2)  Acute Frontal Sinusitis  (ICD-461.1) 3)  Cystitis, Chronic Interstitial  (ICD-595.1) 4)  Unspecified Hypothyroidism  (ICD-244.9) 5)  Lipoma of Other Specified Sites  (ICD-214.8) 6)  Bruxism  (ICD-306.8) 7)  Syncope  (ICD-780.2) 8)  Mri,  Brain, Abnormal  (ICD-794.09) 9)  Muscle Weakness (GENERALIZED)  (ICD-728.87) 10)  Cerumen Impaction, Bilateral  (ICD-380.4) 11)  Candidiasis of Unspecified Site  (ICD-112.9) 12)  Unspecified Allergic Alveolitis and Pneumonitis  (ICD-495.9) 13)  Hip Pain, Left, Chronic  (ICD-719.45) 14)  Malaise and Fatigue  (ICD-780.79) 15)  Irritable  Bowel Syndrome  (ICD-564.1) 16)  Low Back Pain  (ICD-724.2) 17)  Chest Pain, Atypical  (ICD-786.59) 18)  Family History of Cad Female 1st Degree Relative <50  (ICD-V17.3) 19)  Family History of Cad Female 1st Degree Relative <60  (ICD-V16.49) 20)  Extrinsic Asthma, With Exacerbation  (ICD-493.02) 21)  Diabetes Mellitus, Type II, Uncontrolled  (ICD-250.02) 22)  Proteinuria  (ICD-791.0) 23)  Hyperlipidemia  (ICD-272.4) 24)  Hypercalcemia  (ICD-275.42) 25)  Insomnia With Sleep Apnea Unspecified  (ICD-780.51) 26)  Hypertension  (ICD-401.9) 27)  Anemia, B12 Deficiency  (ICD-281.1) 28)  Degenerative Disc Disease, 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.4) 29)  Depression  (ICD-311) 30)  Fibromyalgia  (ICD-729.1) 31)  Allergic Rhinitis  (ICD-477.9)  Medications Prior to Update: 1)  Tizanidine Hcl 4 Mg Tabs (Tizanidine Hcl) .... One By Mouth Two Times A Day For Muscle Spasm and Pain 2)  Furosemide 40 Mg Tabs (Furosemide) .... 1/2 By Mouth Daily 3)  Nadolol 40 Mg Tabs (Nadolol) .... Once Daily 4)  Klor-Con 8 Meq  Tbcr (Potassium Chloride) .... Once Daily 5)  Ambien 10 Mg  Tabs (Zolpidem Tartrate) .... As Needed 6)  Alprazolam 0.5 Mg  Tabs (Alprazolam) .... Every 8 Hrs Prn 7)  Omeprazole 40 Mg Cpdr (Omeprazole) .Marland Kitchen.. 1 Once Daily 8)  Singulair 10 Mg  Tabs (Montelukast Sodium) .Marland Kitchen.. 1 Once Daily 9)  Citalopram Hydrobromide 40 Mg Tabs (Citalopram Hydrobromide) .... One By Mouth Daily 10)  Naltrexone Hcl 4.5 Mg Tabs (Naltrexone Hcl) .... One By Mouth Daily Per Dr Alessandra Bevels 11)  Symbicort 160-4.5 Mcg/act Aero (Budesonide-Formoterol Fumarate) .... Two Puf By Mouth Two Times A Day 12)  Rhinocort Aqua 32 Mcg/act Susp (Budesonide) .... Use As Directed 13)  Chlorzoxazone 500 Mg Tabs (Chlorzoxazone) .... One By Mouth Two Times A Day Prn 14)  Astelin 137 Mcg/spray Soln (Azelastine Hcl) .... Use As Directed 15)  Vitamin D 1000 Unit Caps (Cholecalciferol) .Marland Kitchen.. 1 Once Daily 16)  Cytomel 25 Mcg Tabs (Liothyronine Sodium) .Marland Kitchen.. 1  Once Daily 17)  Nuvigil 150 Mg Tabs (Armodafinil) .... One By Mouth Daily 18)  Im Injections-D12- 19)  Zyrtec Allergy 10 Mg Caps (Cetirizine Hcl) .... One By Mouth At Bed Time 20)  Budeprion Sr 150 Mg Xr12h-Tab (Bupropion Hcl) .... One By Mouth Daily 21)  Macrodantin 100 Mg Caps (Nitrofurantoin Macrocrystal) .... One By Mouth Daily 22)  Levaquin 500 Mg Tabs (Levofloxacin) .Marland Kitchen.. 1 Once Daily For 5 Days 23)  Diflucan 150 Mg Tabs (Fluconazole) .... One By Mouth Now, and Repeat in 5 Days 24)  Cipro 500 Mg Tabs (Ciprofloxacin Hcl) .... One By Mouth Two Times A Day X 5 Days 25)  Fluconazole 150 Mg Tabs (Fluconazole) .... One By Mouth Now and Repeat in 5 Days.  Current Medications (verified): 1)  Tizanidine Hcl 4 Mg Tabs (Tizanidine Hcl) .... One By Mouth Two Times A Day For Muscle Spasm and Pain 2)  Furosemide 40 Mg Tabs (Furosemide) .... 1/2 By Mouth Daily At T Imes 3)  Nadolol 40 Mg Tabs (Nadolol) .... Once Daily 4)  Klor-Con 8 Meq  Tbcr (Potassium Chloride) .... Once Daily 5)  Ambien 10 Mg  Tabs (Zolpidem Tartrate) .... As Needed  6)  Alprazolam 0.5 Mg  Tabs (Alprazolam) .... Every 8 Hrs Prn 7)  Omeprazole 40 Mg Cpdr (Omeprazole) .Marland Kitchen.. 1 Once Daily At Times 8)  Singulair 10 Mg  Tabs (Montelukast Sodium) .Marland Kitchen.. 1 Once Daily 9)  Citalopram Hydrobromide 40 Mg Tabs (Citalopram Hydrobromide) .... One By Mouth Daily 10)  Symbicort 160-4.5 Mcg/act Aero (Budesonide-Formoterol Fumarate) .... Two Puf By Mouth Two Times A Day 11)  Rhinocort Aqua 32 Mcg/act Susp (Budesonide) .... Use As Directed 12)  Chlorzoxazone 500 Mg Tabs (Chlorzoxazone) .... One By Mouth Two Times A Day Prn 13)  Astelin 137 Mcg/spray Soln (Azelastine Hcl) .... Use As Directed 14)  Vitamin D 1000 Unit Caps (Cholecalciferol) .Marland Kitchen.. 1 Once Daily 15)  Cytomel 25 Mcg Tabs (Liothyronine Sodium) .Marland Kitchen.. 1 Once Daily 16)  Nuvigil 150 Mg Tabs (Armodafinil) .... 1/2 Tab At Times 17)  Im Injections-D12- 18)  Zyrtec Allergy 10 Mg Caps (Cetirizine Hcl)  .... One By Mouth At Bed Time 19)  Budeprion Sr 150 Mg Xr12h-Tab (Bupropion Hcl) .... One By Mouth Daily 20)  Macrodantin 100 Mg Caps (Nitrofurantoin Macrocrystal) .... One By Mouth Daily 21)  Allegra 180 Mg Tabs (Fexofenadine Hcl) .Marland Kitchen.. 1 Once Daily 22)  Fagifilm .... Use As Directed 23)  Camila 0.35 Mg Tabs (Norethindrone) .... Use As Directed 24)  Zantac 150 Mg Tabs (Ranitidine Hcl) .... Once Daily As Needed 25)  Lactaid 3000 Unit Tabs (Lactase) .... Once Daily As Needed 26)  Claritin 10 Mg Tabs (Loratadine) .... Once Daily As Needed 27)  Imodium Multi-Symptom Relief 2-125 Mg Tabs (Loperamide-Simethicone) .... As Needed 28)  Xifaxan 550 Mg Tabs (Rifaximin) .Marland Kitchen.. 1 Two Times A Day  Allergies (verified): 1)  ! Codeine 2)  ! Vicodin 3)  ! Tetracycline  Past History:  Family History: Last updated: 03/02/2008 brother and mother Family History of CAD Female 1st degree relative <60 Family History of CAD Female 1st degree relative <50  Social History: Last updated: 09/14/2007 Retired Divorced Former Smoker Drug use-no Regular exercise-no  Risk Factors: Exercise: no (09/14/2007)  Risk Factors: Smoking Status: quit (01/08/2010)  Past medical, surgical, family and social histories (including risk factors) reviewed, and no changes noted (except as noted below).  Past Medical History: Reviewed history from 05/26/2008 and no changes required. Allergic rhinitis Depression fibromyalgia Hypertension Hyperlipidemia Low back pain  Past Surgical History: Reviewed history from 05/26/2008 and no changes required. Denies surgical history  Family History: Reviewed history from 03/02/2008 and no changes required. brother and mother Family History of CAD Female 1st degree relative <60 Family History of CAD Female 1st degree relative <50  Social History: Reviewed history from 09/14/2007 and no changes required. Retired Divorced Former Smoker Drug use-no Regular  exercise-no  Review of Systems  The patient denies anorexia, fever, weight loss, weight gain, vision loss, decreased hearing, hoarseness, chest pain, syncope, dyspnea on exertion, peripheral edema, prolonged cough, headaches, hemoptysis, abdominal pain, melena, hematochezia, severe indigestion/heartburn, hematuria, incontinence, genital sores, muscle weakness, suspicious skin lesions, transient blindness, difficulty walking, depression, unusual weight change, abnormal bleeding, enlarged lymph nodes, angioedema, and breast masses.    Physical Exam  General:  alert and overweight-appearing.   Head:  normocephalic and atraumatic.   Eyes:  pupils equal and pupils round.   Neck:  No deformities, masses, or tenderness noted. Lungs:  normal respiratory effort and no dullness.   Heart:  normal rate and regular rhythm.   Abdomen:  soft, non-tender, and normal bowel sounds.   Msk:  decreased ROM, joint  tenderness, and joint swelling.   Extremities:  1+ left pedal edema and 1+ right pedal edema.   Neurologic:  alert & oriented X3.     Impression & Recommendations:  Problem # 1:  DIABETES MELLITUS, TYPE II, UNCONTROLLED (ICD-250.02) good A1c Labs Reviewed: Creat: 0.7 (01/24/2009)    Reviewed HgBA1c results: 5.7 (11/05/2009)  5.8 (01/24/2009)  Problem # 2:  CYSTITIS, ACUTE, RECURRENT (ICD-595.0)  still has hematuria occasionlly and last culture was normal the possibility of IC since the culture was negative dr Marcy Siren Sallyanne Kuster) The following medications were removed from the medication list:    Levaquin 500 Mg Tabs (Levofloxacin) .Marland Kitchen... 1 once daily for 5 days    Cipro 500 Mg Tabs (Ciprofloxacin hcl) ..... One by mouth two times a day x 5 days Her updated medication list for this problem includes:    Macrodantin 100 Mg Caps (Nitrofurantoin macrocrystal) ..... One by mouth daily    Xifaxan 550 Mg Tabs (Rifaximin) .Marland Kitchen... 1 two times a day  Encouraged to push clear liquids, get enough rest, and  take acetaminophen as needed. To be seen in 10 days if no improvement, sooner if worse.  Orders: UA Dipstick w/o Micro (automated)  (81003) T-Culture, Urine (29562-13086)  Problem # 3:  IRRITABLE BOWEL SYNDROME (ICD-564.1)  THE gi DOC HAS PLACED HER ON XIFAXAN two times a day FOR POSSIBLE ANTIBIOTIC ASSOCIATED DIARRHEA  Problem # 4:  MALAISE AND FATIGUE (ICD-780.79) INCREASED FATIGUE  Problem # 5:  INSOMNIA WITH SLEEP APNEA UNSPECIFIED (ICD-780.51) NEEDS NUVIGIL MORE CONSISTANTLY  Problem # 6:  FIBROMYALGIA (ICD-729.1) Assessment: Deteriorated  Her updated medication list for this problem includes:    Tizanidine Hcl 4 Mg Tabs (Tizanidine hcl) ..... One by mouth two times a day for muscle spasm and pain    Chlorzoxazone 500 Mg Tabs (Chlorzoxazone) ..... One by mouth two times a day prn  Complete Medication List: 1)  Tizanidine Hcl 4 Mg Tabs (Tizanidine hcl) .... One by mouth two times a day for muscle spasm and pain 2)  Furosemide 40 Mg Tabs (Furosemide) .... 1/2 by mouth daily at t imes 3)  Nadolol 40 Mg Tabs (Nadolol) .... Once daily 4)  Klor-con 8 Meq Tbcr (Potassium chloride) .... Once daily 5)  Ambien 10 Mg Tabs (Zolpidem tartrate) .... As needed 6)  Alprazolam 0.5 Mg Tabs (Alprazolam) .... Every 8 hrs prn 7)  Omeprazole 40 Mg Cpdr (Omeprazole) .Marland Kitchen.. 1 once daily at times 8)  Singulair 10 Mg Tabs (Montelukast sodium) .Marland Kitchen.. 1 once daily 9)  Citalopram Hydrobromide 40 Mg Tabs (Citalopram hydrobromide) .... One by mouth daily 10)  Symbicort 160-4.5 Mcg/act Aero (Budesonide-formoterol fumarate) .... Two puf by mouth two times a day 11)  Rhinocort Aqua 32 Mcg/act Susp (Budesonide) .... Use as directed 12)  Chlorzoxazone 500 Mg Tabs (Chlorzoxazone) .... One by mouth two times a day prn 13)  Astelin 137 Mcg/spray Soln (Azelastine hcl) .... Use as directed 14)  Vitamin D 1000 Unit Caps (Cholecalciferol) .Marland Kitchen.. 1 once daily 15)  Cytomel 25 Mcg Tabs (Liothyronine sodium) .Marland Kitchen.. 1 once  daily 16)  Nuvigil 150 Mg Tabs (Armodafinil) .... 1/2 tab at times 17)  Im Injections-d12-  18)  Zyrtec Allergy 10 Mg Caps (Cetirizine hcl) .... One by mouth at bed time 19)  Budeprion Sr 150 Mg Xr12h-tab (Bupropion hcl) .... One by mouth daily 20)  Macrodantin 100 Mg Caps (Nitrofurantoin macrocrystal) .... One by mouth daily 21)  Allegra 180 Mg Tabs (Fexofenadine hcl) .Marland Kitchen.. 1 once  daily 22)  Fagifilm  .... Use as directed 23)  Camila 0.35 Mg Tabs (Norethindrone) .... Use as directed 24)  Zantac 150 Mg Tabs (Ranitidine hcl) .... Once daily as needed 25)  Lactaid 3000 Unit Tabs (Lactase) .... Once daily as needed 26)  Claritin 10 Mg Tabs (Loratadine) .... Once daily as needed 27)  Imodium Multi-symptom Relief 2-125 Mg Tabs (Loperamide-simethicone) .... As needed 28)  Xifaxan 550 Mg Tabs (Rifaximin) .Marland Kitchen.. 1 two times a day  Other Orders: Venipuncture (16109) TLB-TSH (Thyroid Stimulating Hormone) (84443-TSH) TLB-Calcium (82310-CA) TLB-Phosphorus (84100-PHOS) TLB-Magnesium (Mg) (83735-MG) TLB-T4 (Thyrox), Free [phone]) TLB-T3, Free (Triiodothyronine) (84481-T3FREE)  Hypertension Assessment/Plan:      The patient's hypertensive risk group is category C: Target organ damage and/or diabetes.  Today's blood pressure is 144/90.  Her blood pressure goal is < 125/75.  Lipid Assessment/Plan:      Based on NCEP/ATP III, the patient's risk factor category is "history of diabetes".  The patient's lipid goals are as follows: Total cholesterol goal is 200; LDL cholesterol goal is 100; HDL cholesterol goal is 40; Triglyceride goal is 150.  Her LDL cholesterol goal has been met.    Patient Instructions: 1)  Please schedule a follow-up appointment in 2 months.  Laboratory Results   Urine Tests  Date/Time Recieved: January 08, 2010 4:52 PM  Date/Time Reported: January 08, 2010 4:52 PM   Routine Urinalysis   Color: yellow Appearance: Clear Glucose: negative   (Normal Range: Negative) Bilirubin:  negative   (Normal Range: Negative) Ketone: negative   (Normal Range: Negative) Spec. Gravity: 1.020   (Normal Range: 1.003-1.035) Blood: negative   (Normal Range: Negative) pH: 7.0   (Normal Range: 5.0-8.0) Protein: 1+   (Normal Range: Negative) Urobilinogen: 0.2   (Normal Range: 0-1) Nitrite: negative   (Normal Range: Negative) Leukocyte Esterace: negative   (Normal Range: Negative)    Comments: Wynona Canes, CMA  January 08, 2010 4:52 PM      Appended Document: Orders Update     Clinical Lists Changes  Orders: Added new Referral order of Physical Therapy Referral (PT) - Signed

## 2010-10-29 NOTE — Progress Notes (Signed)
Summary: Pt req work in ov with Dr Lovell Sheehan asap for Diverticulitis  Phone Note Call from Patient Call back at [facility] Phone [phone]   Caller: Patient Summary of Call: Pt went to ER on Tuesday 08/13/10 and was dx with Diverticulitis. Pt has ov sch for 08/28/10 with Dr Lovell Sheehan and is req to come in to see him asap.  Does not want to see another doctor.  Initial call taken by: Lucy Antigua,  August 14, 2010 9:58 AM  Follow-up for Phone Call        we have no openings- if she feels like she needs to see s omeone earlier, she will need to see a partner Follow-up by: Willy Eddy, LPN,  August 14, 2010 10:07 AM  Additional Follow-up for Phone Call Additional follow up Details #1::        scheduled with Dr. Caryl Never this week.  Pt requested Friday. Additional Follow-up by: Lynann Beaver CMA AAMA,  August 14, 2010 10:16 AM

## 2010-10-29 NOTE — Progress Notes (Signed)
Summary: dizzy  Phone Note Call from Patient   Caller: Pt walks in Call For: Stacie Glaze MD Summary of Call: Pt walks in stating she is dizzy and wants her BP checked. BP 120/72 Pulse 64 Per Dr. Lovell Sheehan, drink one Ensure daily and push H20 ...Marland KitchenMarland KitchenMarland Kitchenmay be a little dehydrated.   Explained to pt about how to get up from supine position to avoid dizzness, and what to do when she feels the dizzines coming on. Initial call taken by: Lynann Beaver CMA AAMA,  September 04, 2010 3:59 PM  Follow-up for Phone Call        dr Lovell Sheehan is aware Follow-up by: Willy Eddy, LPN,  September 04, 2010 4:25 PM

## 2010-10-29 NOTE — Progress Notes (Signed)
Summary: test strips  Phone Note Call from Patient   Caller: Patient Call For: Stacie Glaze MD Summary of Call: pt needs test strips for free style freedom lite call into [facility] [phone] Initial call taken by: Heron Sabins,  July 25, 2010 3:16 PM    New/Updated Medications: FREESTYLE LITE TEST  STRP (GLUCOSE BLOOD) Use as directed Prescriptions: FREESTYLE LITE TEST  STRP (GLUCOSE BLOOD) Use as directed  #100 x 3   Entered by:   Willy Eddy, LPN   Authorized by:   Stacie Glaze MD   Signed by:   Willy Eddy, LPN on 16/06/9603   Method used:   Electronically to        [facility] [phone]* (retail)       [address]       [place], [place]  [address]       Ph: [phone] or [phone]       Fax: [phone]   RxID:   [id number]

## 2010-10-29 NOTE — Assessment & Plan Note (Signed)
Summary: 2 MONTH ROA//LH/PT RSC/CJR   Vital Signs:  Patient profile:   55 year old female Height:      60 inches Weight:      168 pounds BMI:     32.93 Temp:     98.2 degrees F oral Pulse rate:   76 / minute Resp:     14 per minute BP sitting:   150 / 90  (left arm)  Vitals Entered By: Willy Eddy, LPN (November 05, 2009 2:23 PM) CC: roa   CC:  roa.  History of Present Illness: Has reduced the furosemide has had increased GI "gas" and has been taking increased amounts of lactaid and gax-x the pt has increased mucles pain and would [facility] a referral to PT for joint mobilizations and strengthening Pt has symptoms of UTI with frequency and blood seen in her urine   Preventive Screening-Counseling & Management  Alcohol-Tobacco     Smoking Status: quit  Current Problems (verified): 1)  Acute Frontal Sinusitis  (ICD-461.1) 2)  Cystitis, Chronic Interstitial  (ICD-595.1) 3)  Unspecified Hypothyroidism  (ICD-244.9) 4)  Lipoma of Other Specified Sites  (ICD-214.8) 5)  Bruxism  (ICD-306.8) 6)  Syncope  (ICD-780.2) 7)  Mri, Brain, Abnormal  (ICD-794.09) 8)  Muscle Weakness (GENERALIZED)  (ICD-728.87) 9)  Cerumen Impaction, Bilateral  (ICD-380.4) 10)  Candidiasis of Unspecified Site  (ICD-112.9) 11)  Unspecified Allergic Alveolitis and Pneumonitis  (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) 12)  Hip Pain, Left, Chronic  (ICD-719.45) 13)  Malaise and Fatigue  (ICD-780.79) 14)  Irritable Bowel Syndrome  (ICD-564.1) 15)  Low Back Pain  (ICD-724.2) 16)  Chest Pain, Atypical  (ICD-786.59) 17)  Family History of Cad Female 1st Degree Relative <50  (ICD-V17.3) 18)  Family History of Cad Female 1st Degree Relative <60  (ICD-V16.49) 19)  Extrinsic Asthma, With Exacerbation  (ICD-493.02) 20)  Diabetes Mellitus, Type II, Uncontrolled  (ICD-250.02) 21)  Proteinuria  (ICD-791.0) 22)  Hyperlipidemia  (ICD-272.4) 23)  Hypercalcemia  (ICD-275.42) 24)  Insomnia With Sleep Apnea Unspecified  (ICD-780.51) 25)   Hypertension  (ICD-401.9) 26)  Anemia, B12 Deficiency  (ICD-281.1) 27)  Degenerative Disc Disease, 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.4) 28)  Depression  (ICD-311) 29)  Fibromyalgia  (ICD-729.1) 30)  Allergic Rhinitis  (ICD-477.9)  Allergies: 1)  ! Codeine 2)  ! Vicodin 3)  ! Tetracycline  Past History:  Family History: Last updated: 03/02/2008 brother and mother Family History of CAD Female 1st degree relative <60 Family History of CAD Female 1st degree relative <50  Social History: Last updated: 09/14/2007 Retired Divorced Former Smoker Drug use-no Regular exercise-no  Risk Factors: Exercise: no (09/14/2007)  Risk Factors: Smoking Status: quit (11/05/2009)  Past medical, surgical, family and social histories (including risk factors) reviewed, and no changes noted (except as noted below).  Past Medical History: Reviewed history from 05/26/2008 and no changes required. Allergic rhinitis Depression fibromyalgia Hypertension Hyperlipidemia Low back pain  Past Surgical History: Reviewed history from 05/26/2008 and no changes required. Denies surgical history  Family History: Reviewed history from 03/02/2008 and no changes required. brother and mother Family History of CAD Female 1st degree relative <60 Family History of CAD Female 1st degree relative <50  Social History: Reviewed history from 09/14/2007 and no changes required. Retired Divorced Former Smoker Drug use-no Regular exercise-no  Review of Systems  The patient denies anorexia, fever, weight loss, weight gain, vision loss, decreased hearing, hoarseness, chest pain, syncope, dyspnea on exertion, peripheral edema, prolonged cough, headaches, hemoptysis, abdominal pain, melena, hematochezia,  severe indigestion/heartburn, hematuria, incontinence, genital sores, muscle weakness, suspicious skin lesions, transient blindness, difficulty walking, depression, unusual weight change, abnormal bleeding, enlarged lymph  nodes, angioedema, and breast masses.    Physical Exam  General:  alert and overweight-appearing.   Head:  normocephalic and atraumatic.   Eyes:  pupils equal and pupils round.   Ears:  R ear normal and L ear normal.   Nose:  no external deformity and no nasal discharge.   Mouth:  pharynx pink and moist and no exudates.   Neck:  No deformities, masses, or tenderness noted. Lungs:  normal respiratory effort and no dullness.   Heart:  normal rate and regular rhythm.   Abdomen:  soft, non-tender, and normal bowel sounds.   Msk:  decreased ROM, joint tenderness, and joint swelling.   Extremities:  trace left pedal edema and trace right pedal edema.   Neurologic:  alert & oriented X3 and cranial nerves II-XII intact.     Impression & Recommendations:  Problem # 1:  CYSTITIS, ACUTE, RECURRENT (ICD-595.0)  The following medications were removed from the medication list:    Azithromycin 250 Mg Tabs (Azithromycin) .Marland Kitchen..Marland Kitchen Two by mouth now and the one by mouth daily for 4 days Her updated medication list for this problem includes:    Macrodantin 100 Mg Caps (Nitrofurantoin macrocrystal) ..... One by mouth daily macrobid was out for 10 days  Encouraged to push clear liquids, get enough rest, and take acetaminophen as needed. To be seen in 10 days if no improvement, sooner if worse.  Orders: Specimen Handling (16109) TLB-Udip w/ Micro (81001-URINE) T-Urine Culture (Spectrum Order) [phone])  Problem # 2:  DIABETES MELLITUS, TYPE II, UNCONTROLLED (ICD-250.02)  Labs Reviewed: Creat: 0.7 (01/24/2009)    Reviewed HgBA1c results: 5.8 (01/24/2009)  5.6 (08/30/2008)  Orders: TLB-A1C / Hgb A1C (Glycohemoglobin) (83036-A1C)  Problem # 3:  FIBROMYALGIA (ICD-729.1)  increased back and hip pain Her updated medication list for this problem includes:    Tizanidine Hcl 4 Mg Tabs (Tizanidine hcl) ..... One by mouth two times a day for muscle spasm and pain    Chlorzoxazone 500 Mg Tabs  (Chlorzoxazone) ..... One by mouth two times a day prn  Orders: Physical Therapy Referral (PT)  Problem # 4:  HIP PAIN, LEFT, CHRONIC (ICD-719.45) Informed consen obtained and then the joint was prepped in a sterile manor and 40 mg depo and 1/2 cc 1% lidocaine injected into the synovial space. After care discussed. Pt tolerated procedure well.  Her updated medication list for this problem includes:    Tizanidine Hcl 4 Mg Tabs (Tizanidine hcl) ..... One by mouth two times a day for muscle spasm and pain    Chlorzoxazone 500 Mg Tabs (Chlorzoxazone) ..... One by mouth two times a day prn  Orders: Physical Therapy Referral (PT) Joint Aspirate / Injection, Large (20610) Depo- Medrol 40mg  (J1030)  Discussed use of medications, application of heat or cold, and exercises.   Complete Medication List: 1)  Tizanidine Hcl 4 Mg Tabs (Tizanidine hcl) .... One by mouth two times a day for muscle spasm and pain 2)  Furosemide 40 Mg Tabs (Furosemide) .... 1/2 by mouth daily 3)  Nadolol 40 Mg Tabs (Nadolol) .... Once daily 4)  Klor-con 8 Meq Tbcr (Potassium chloride) .... Once daily 5)  Ambien 10 Mg Tabs (Zolpidem tartrate) .... As needed 6)  Alprazolam 0.5 Mg Tabs (Alprazolam) .... Every 8 hrs prn 7)  Omeprazole 40 Mg Cpdr (Omeprazole) .Marland Kitchen.. 1 once daily 8)  Singulair 10 Mg Tabs (Montelukast sodium) .Marland Kitchen.. 1 once daily 9)  Citalopram Hydrobromide 40 Mg Tabs (Citalopram hydrobromide) .... One by mouth daily 10)  Naltrexone Hcl 4.5 Mg Tabs (naltrexone Hcl)  .... One by mouth daily per dr Alessandra Bevels 11)  Symbicort 160-4.5 Mcg/act Aero (Budesonide-formoterol fumarate) .... Two puf by mouth two times a day 12)  Rhinocort Aqua 32 Mcg/act Susp (Budesonide) .... Use as directed 13)  Chlorzoxazone 500 Mg Tabs (Chlorzoxazone) .... One by mouth two times a day prn 14)  Astelin 137 Mcg/spray Soln (Azelastine hcl) .... Use as directed 15)  Vitamin D 1000 Unit Caps (Cholecalciferol) .Marland Kitchen.. 1 once daily 16)  Cytomel 25  Mcg Tabs (Liothyronine sodium) .Marland Kitchen.. 1 once daily 17)  Nuvigil 150 Mg Tabs (Armodafinil) .... One by mouth daily 18)  Im Injections-d12-  19)  Zyrtec Allergy 10 Mg Caps (Cetirizine hcl) .... One by mouth at bed time 20)  Budeprion Sr 150 Mg Xr12h-tab (Bupropion hcl) .... One by mouth daily 21)  Macrodantin 100 Mg Caps (Nitrofurantoin macrocrystal) .... One by mouth daily  Other Orders: Vit B12 1000 mcg (J3420) Admin of Therapeutic Inj  intramuscular or subcutaneous (16109)  Patient Instructions: 1)  Please schedule a follow-up appointment in 2 months.   Medication Administration  Injection # 1:    Medication: Vit B12 1000 mcg    Diagnosis: ANEMIA, B12 DEFICIENCY (ICD-281.1)    Route: IM    Site: L deltoid    Exp Date: 05/30/2011    Lot #: [id number]    Mfr: [employer]    Patient tolerated injection without complications    Given by: Willy Eddy, LPN (November 05, 2009 2:39 PM)  Orders Added: 1)  Vit B12 1000 mcg [J3420] 2)  Admin of Therapeutic Inj  intramuscular or subcutaneous [96372] 3)  Specimen Handling [99000] 4)  TLB-Udip w/ Micro [81001-URINE] 5)  T-Urine Culture (Spectrum Order) [[id number]] 6)  TLB-A1C / Hgb A1C (Glycohemoglobin) [83036-A1C] 7)  Physical Therapy Referral [PT] 8)  Est. Patient Level IV [82956] 9)  Joint Aspirate / Injection, Large [20610] 10)  Depo- Medrol 40mg  [J1030]   Appended Document: 2 MONTH ROA//LH/PT RSC/CJR  Laboratory Results   Urine Tests    Routine Urinalysis   Color: yellow Appearance: Clear Glucose: negative   (Normal Range: Negative) Bilirubin: negative   (Normal Range: Negative) Ketone: negative   (Normal Range: Negative) Spec. Gravity: 1.025   (Normal Range: 1.003-1.035) Blood: negative   (Normal Range: Negative) pH: 6.0   (Normal Range: 5.0-8.0) Protein: 1+   (Normal Range: Negative) Urobilinogen: 0.2   (Normal Range: 0-1) Nitrite: negative   (Normal Range: Negative) Leukocyte Esterace: negative   (Normal  Range: Negative)    Comments: Rita Ohara  November 05, 2009 3:36 PM

## 2010-10-29 NOTE — Progress Notes (Signed)
Summary: flagy refill  Phone Note Call from Patient Call back at Home Phone [phone] Call back at [phone] till in the am.     Caller: vm Summary of Call: Med for bacterial vaginitis - Flagyl.  Still having same symptoms.  Request refill [facility]. Initial call taken by: Rudy Jew, RN,  June 24, 2010 9:54 AM  Follow-up for Phone Call        per dr Lovell Sheehan may have flagyl 500 1 two times a day for 7 days Follow-up by: Willy Eddy, LPN,  June 24, 2010 11:45 AM  Additional Follow-up for Phone Call Additional follow up Details #1::        Phone Call Completed Additional Follow-up by: Rudy Jew, RN,  June 24, 2010 12:52 PM    New/Updated Medications: FLAGYL 500 MG TABS (METRONIDAZOLE) One two times a day for 7 days Prescriptions: FLAGYL 500 MG TABS (METRONIDAZOLE) One two times a day for 7 days  #14 x 0   Entered by:   Rudy Jew, RN   Authorized by:   Stacie Glaze MD   Signed by:   Rudy Jew, RN on 06/24/2010   Method used:   Electronically to        [facility]  [address] [phone] (retail)       [address]       [place], [place]  [address]       Ph: [phone] or [phone]       Fax: [phone]   RxID:   [id number]

## 2010-10-29 NOTE — Assessment & Plan Note (Signed)
Summary: 2 month fup//ccm   Vital Signs:  Patient profile:   55 year old female Height:      60 inches Weight:      174 pounds BMI:     34.10 Temp:     98.2 degrees F oral Resp:     14 per minute BP sitting:   142 / 84  (left arm)  Vitals Entered By: Willy Eddy, LPN (March 12, 2010 2:47 PM) CC: roa, Hypertension Management   CC:  roa and Hypertension Management.  History of Present Illness: Fibromyagial pain is increased Hx of  back pain and thoracic pain ad left hip pain    Asthma History    Initial Asthma Severity Rating:    Age range: 12+ years    Symptoms: 0-2 days/week    Nighttime Awakenings: 0-2/month    Interferes w/ normal activity: some limitations    Asthma Severity Assessment: Moderate Persistent  Hypertension History:      She denies headache, chest pain, palpitations, dyspnea with exertion, orthopnea, PND, peripheral edema, visual symptoms, neurologic problems, syncope, and side effects from treatment.        Positive major cardiovascular risk factors include diabetes, hyperlipidemia, and hypertension.  Negative major cardiovascular risk factors include female age less than 73 years old and non-tobacco-user status.        Further assessment for target organ damage reveals no history of ASHD, stroke/TIA, or peripheral vascular disease.      Preventive Screening-Counseling & Management  Alcohol-Tobacco     Smoking Status: quit  Problems Prior to Update: 1)  Cystitis, Acute, Recurrent  (ICD-595.0) 2)  Acute Frontal Sinusitis  (ICD-461.1) 3)  Cystitis, Chronic Interstitial  (ICD-595.1) 4)  Unspecified Hypothyroidism  (ICD-244.9) 5)  Lipoma of Other Specified Sites  (ICD-214.8) 6)  Bruxism  (ICD-306.8) 7)  Syncope  (ICD-780.2) 8)  Mri, Brain, Abnormal  (ICD-794.09) 9)  Muscle Weakness (GENERALIZED)  (ICD-728.87) 10)  Cerumen Impaction, Bilateral  (ICD-380.4) 11)  Candidiasis of Unspecified Site  (ICD-112.9) 12)  Unspecified Allergic Alveolitis  and Pneumonitis  (ICD-495.9) 13)  Hip Pain, Left, Chronic  (ICD-719.45) 14)  Malaise and Fatigue  (ICD-780.79) 15)  Irritable Bowel Syndrome  (ICD-564.1) 16)  Low Back Pain  (ICD-724.2) 17)  Chest Pain, Atypical  (ICD-786.59) 18)  Family History of Cad Female 1st Degree Relative <50  (ICD-V17.3) 19)  Family History of Cad Female 1st Degree Relative <60  (ICD-V16.49) 20)  Extrinsic Asthma, With Exacerbation  (ICD-493.02) 21)  Diabetes Mellitus, Type II, Uncontrolled  (ICD-250.02) 22)  Proteinuria  (ICD-791.0) 23)  Hyperlipidemia  (ICD-272.4) 24)  Hypercalcemia  (ICD-275.42) 25)  Insomnia With Sleep Apnea Unspecified  (ICD-780.51) 26)  Hypertension  (ICD-401.9) 27)  Anemia, B12 Deficiency  (ICD-281.1) 28)  Degenerative Disc Disease, 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.4) 29)  Depression  (ICD-311) 30)  Fibromyalgia  (ICD-729.1) 31)  Allergic Rhinitis  (ICD-477.9)  Current Problems (verified): 1)  Cystitis, Acute, Recurrent  (ICD-595.0) 2)  Acute Frontal Sinusitis  (ICD-461.1) 3)  Cystitis, Chronic Interstitial  (ICD-595.1) 4)  Unspecified Hypothyroidism  (ICD-244.9) 5)  Lipoma of Other Specified Sites  (ICD-214.8) 6)  Bruxism  (ICD-306.8) 7)  Syncope  (ICD-780.2) 8)  Mri, Brain, Abnormal  (ICD-794.09) 9)  Muscle Weakness (GENERALIZED)  (ICD-728.87) 10)  Cerumen Impaction, Bilateral  (ICD-380.4) 11)  Candidiasis of Unspecified Site  (ICD-112.9) 12)  Unspecified Allergic Alveolitis and Pneumonitis  (ICD-495.9) 13)  Hip Pain, Left, Chronic  (ICD-719.45) 14)  Malaise and Fatigue  (ICD-780.79) 15)  Irritable Bowel Syndrome  (ICD-564.1) 16)  Low Back Pain  (ICD-724.2) 17)  Chest Pain, Atypical  (ICD-786.59) 18)  Family History of Cad Female 1st Degree Relative <50  (ICD-V17.3) 19)  Family History of Cad Female 1st Degree Relative <60  (ICD-V16.49) 20)  Extrinsic Asthma, With Exacerbation  (ICD-493.02) 21)  Diabetes Mellitus, Type II, Uncontrolled  (ICD-250.02) 22)  Proteinuria  (ICD-791.0) 23)   Hyperlipidemia  (ICD-272.4) 24)  Hypercalcemia  (ICD-275.42) 25)  Insomnia With Sleep Apnea Unspecified  (ICD-780.51) 26)  Hypertension  (ICD-401.9) 27)  Anemia, B12 Deficiency  (ICD-281.1) 28)  Degenerative Disc Disease, 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.4) 29)  Depression  (ICD-311) 30)  Fibromyalgia  (ICD-729.1) 31)  Allergic Rhinitis  (ICD-477.9)  Medications Prior to Update: 1)  Tizanidine Hcl 4 Mg Tabs (Tizanidine Hcl) .... One By Mouth Two Times A Day For Muscle Spasm and Pain 2)  Furosemide 40 Mg Tabs (Furosemide) .... 1/2 By Mouth Daily At T Imes 3)  Nadolol 40 Mg Tabs (Nadolol) .... Once Daily 4)  Klor-Con 8 Meq  Tbcr (Potassium Chloride) .... Once Daily 5)  Ambien 10 Mg  Tabs (Zolpidem Tartrate) .... As Needed 6)  Alprazolam 0.5 Mg  Tabs (Alprazolam) .... Every 8 Hrs Prn 7)  Omeprazole 40 Mg Cpdr (Omeprazole) .Marland Kitchen.. 1 Once Daily At Times 8)  Singulair 10 Mg  Tabs (Montelukast Sodium) .Marland Kitchen.. 1 Once Daily 9)  Citalopram Hydrobromide 40 Mg Tabs (Citalopram Hydrobromide) .... One By Mouth Daily 10)  Symbicort 160-4.5 Mcg/act Aero (Budesonide-Formoterol Fumarate) .... Two Puf By Mouth Two Times A Day 11)  Rhinocort Aqua 32 Mcg/act Susp (Budesonide) .... Use As Directed 12)  Chlorzoxazone 500 Mg Tabs (Chlorzoxazone) .... One By Mouth Two Times A Day Prn 13)  Astelin 137 Mcg/spray Soln (Azelastine Hcl) .... Use As Directed 14)  Vitamin D 1000 Unit Caps (Cholecalciferol) .Marland Kitchen.. 1 Once Daily 15)  Cytomel 25 Mcg Tabs (Liothyronine Sodium) .Marland Kitchen.. 1 Once Daily 16)  Nuvigil 150 Mg Tabs (Armodafinil) .... 1/2 Tab At Times 17)  Im Injections-D12- 18)  Zyrtec Allergy 10 Mg Caps (Cetirizine Hcl) .... One By Mouth At Bed Time 19)  Budeprion Sr 150 Mg Xr12h-Tab (Bupropion Hcl) .... One By Mouth Daily 20)  Macrodantin 100 Mg Caps (Nitrofurantoin Macrocrystal) .... One By Mouth Daily 21)  Allegra 180 Mg Tabs (Fexofenadine Hcl) .Marland Kitchen.. 1 Once Daily 22)  Fagifilm .... Use As Directed 23)  Camila 0.35 Mg Tabs  (Norethindrone) .... Use As Directed 24)  Zantac 150 Mg Tabs (Ranitidine Hcl) .... Once Daily As Needed 25)  Lactaid 3000 Unit Tabs (Lactase) .... Once Daily As Needed 26)  Claritin 10 Mg Tabs (Loratadine) .... Once Daily As Needed 27)  Imodium Multi-Symptom Relief 2-125 Mg Tabs (Loperamide-Simethicone) .... As Needed 28)  Xifaxan 550 Mg Tabs (Rifaximin) .Marland Kitchen.. 1 Two Times A Day  Current Medications (verified): 1)  Tizanidine Hcl 4 Mg Tabs (Tizanidine Hcl) .... One By Mouth Two Times A Day For Muscle Spasm and Pain 2)  Furosemide 40 Mg Tabs (Furosemide) .... 1/2 By Mouth Daily At T Imes 3)  Nadolol 40 Mg Tabs (Nadolol) .... Once Daily 4)  Klor-Con 8 Meq  Tbcr (Potassium Chloride) .... Once Daily 5)  Ambien 10 Mg  Tabs (Zolpidem Tartrate) .... As Needed 6)  Alprazolam 0.5 Mg  Tabs (Alprazolam) .... Every 8 Hrs Prn 7)  Omeprazole 40 Mg Cpdr (Omeprazole) .Marland Kitchen.. 1 Once Daily At Times 8)  Singulair 10 Mg  Tabs (Montelukast Sodium) .Marland Kitchen.. 1 Once Daily  9)  Citalopram Hydrobromide 40 Mg Tabs (Citalopram Hydrobromide) .... One By Mouth Daily 10)  Symbicort 160-4.5 Mcg/act Aero (Budesonide-Formoterol Fumarate) .... Two Puf By Mouth Two Times A Day 11)  Rhinocort Aqua 32 Mcg/act Susp (Budesonide) .... Use As Directed 12)  Chlorzoxazone 500 Mg Tabs (Chlorzoxazone) .... One By Mouth Two Times A Day Prn 13)  Astelin 137 Mcg/spray Soln (Azelastine Hcl) .... Use As Directed 14)  Vitamin D 1000 Unit Caps (Cholecalciferol) .Marland Kitchen.. 1 Once Daily 15)  Cytomel 25 Mcg Tabs (Liothyronine Sodium) .Marland Kitchen.. 1 Once Daily 16)  Nuvigil 150 Mg Tabs (Armodafinil) .... 1/2 Tab At Times 17)  Im Injections-D12- 18)  Zyrtec Allergy 10 Mg Caps (Cetirizine Hcl) .... One By Mouth At Bed Time 19)  Budeprion Sr 150 Mg Xr12h-Tab (Bupropion Hcl) .... One By Mouth Daily 20)  Macrodantin 100 Mg Caps (Nitrofurantoin Macrocrystal) .... One By Mouth Daily 21)  Allegra 180 Mg Tabs (Fexofenadine Hcl) .Marland Kitchen.. 1 Once Daily 22)  Fagifilm .... Use As  Directed 23)  Camila 0.35 Mg Tabs (Norethindrone) .... Use As Directed 24)  Zantac 150 Mg Tabs (Ranitidine Hcl) .... Once Daily As Needed 25)  Lactaid 3000 Unit Tabs (Lactase) .... Once Daily As Needed 26)  Claritin 10 Mg Tabs (Loratadine) .... Once Daily As Needed 27)  Imodium Multi-Symptom Relief 2-125 Mg Tabs (Loperamide-Simethicone) .... As Needed  Allergies (verified): 1)  ! Codeine 2)  ! Vicodin 3)  ! Tetracycline  Past History:  Family History: Last updated: 03/02/2008 brother and mother Family History of CAD Female 1st degree relative <60 Family History of CAD Female 1st degree relative <50  Social History: Last updated: 09/14/2007 Retired Divorced Former Smoker Drug use-no Regular exercise-no  Risk Factors: Exercise: no (09/14/2007)  Risk Factors: Smoking Status: quit (03/12/2010)  Past medical, surgical, family and social histories (including risk factors) reviewed, and no changes noted (except as noted below).  Past Medical History: Reviewed history from 05/26/2008 and no changes required. Allergic rhinitis Depression fibromyalgia Hypertension Hyperlipidemia Low back pain  Past Surgical History: Reviewed history from 05/26/2008 and no changes required. Denies surgical history  Family History: Reviewed history from 03/02/2008 and no changes required. brother and mother Family History of CAD Female 1st degree relative <60 Family History of CAD Female 1st degree relative <50  Social History: Reviewed history from 09/14/2007 and no changes required. Retired Divorced Former Smoker Drug use-no Regular exercise-no  Review of Systems       The patient complains of dyspnea on exertion, peripheral edema, headaches, and abdominal pain.  The patient denies anorexia, fever, weight loss, weight gain, vision loss, decreased hearing, hoarseness, chest pain, syncope, prolonged cough, hemoptysis, melena, hematochezia, severe indigestion/heartburn, hematuria,  incontinence, genital sores, muscle weakness, suspicious skin lesions, transient blindness, difficulty walking, depression, unusual weight change, abnormal bleeding, enlarged lymph nodes, angioedema, and breast masses.    Physical Exam  General:  alert and overweight-appearing.   Head:  normocephalic and atraumatic.   Eyes:  pupils equal and pupils round.   Ears:  R ear normal and L ear normal.   Nose:  no external deformity and no nasal discharge.   Mouth:  pharynx pink and moist and no exudates.   Neck:  No deformities, masses, or tenderness noted. Lungs:  normal respiratory effort and no dullness.   Heart:  normal rate and regular rhythm.   Abdomen:  soft, non-tender, and normal bowel sounds.   Msk:  decreased ROM, joint tenderness, and joint swelling.   Extremities:  1+ left pedal edema and 1+ right pedal edema.   Neurologic:  alert & oriented X3.     Impression & Recommendations:  Problem # 1:  HIP PAIN, LEFT, CHRONIC (ICD-719.45)  Her updated medication list for this problem includes:    Tizanidine Hcl 4 Mg Tabs (Tizanidine hcl) ..... One by mouth two times a day for muscle spasm and pain    Chlorzoxazone 500 Mg Tabs (Chlorzoxazone) ..... One by mouth two times a day prn  Discussed use of medications, application of heat or cold, and exercises.   Orders: T-Hip Comp Left Min 2-views (73510TC) Physical Therapy Referral (PT)  Problem # 2:  FIBROMYALGIA (ICD-729.1)  Her updated medication list for this problem includes:    Tizanidine Hcl 4 Mg Tabs (Tizanidine hcl) ..... One by mouth two times a day for muscle spasm and pain    Chlorzoxazone 500 Mg Tabs (Chlorzoxazone) ..... One by mouth two times a day prn  Orders: T-Thoracic Spine 2 Views [id number]) Physical Therapy Referral (PT)  Problem # 3:  LOW BACK PAIN (ICD-724.2)  Her updated medication list for this problem includes:    Tizanidine Hcl 4 Mg Tabs (Tizanidine hcl) ..... One by mouth two times a day for muscle  spasm and pain    Chlorzoxazone 500 Mg Tabs (Chlorzoxazone) ..... One by mouth two times a day prn  Orders: T-Lumbar Spine Comp w/Bend View [phone]) Physical Therapy Referral (PT)  Discussed use of moist heat or ice, modified activities, medications, and stretching/strengthening exercises. Back care instructions given. To be seen in 2 weeks if no improvement; sooner if worsening of symptoms.   Problem # 4:  MUSCLE WEAKNESS (GENERALIZED) (ICD-728.87) hx of normal  EEG and EMgs and NCV see prior consult from [facility]  Complete Medication List: 1)  Tizanidine Hcl 4 Mg Tabs (Tizanidine hcl) .... One by mouth two times a day for muscle spasm and pain 2)  Furosemide 40 Mg Tabs (Furosemide) .... 1/2 by mouth daily at t imes 3)  Nadolol 40 Mg Tabs (Nadolol) .... Once daily 4)  Klor-con 8 Meq Tbcr (Potassium chloride) .... Once daily 5)  Ambien 10 Mg Tabs (Zolpidem tartrate) .... As needed 6)  Alprazolam 0.5 Mg Tabs (Alprazolam) .... Every 8 hrs prn 7)  Omeprazole 40 Mg Cpdr (Omeprazole) .Marland Kitchen.. 1 once daily at times 8)  Singulair 10 Mg Tabs (Montelukast sodium) .Marland Kitchen.. 1 once daily 9)  Citalopram Hydrobromide 40 Mg Tabs (Citalopram hydrobromide) .... One by mouth daily 10)  Symbicort 160-4.5 Mcg/act Aero (Budesonide-formoterol fumarate) .... Two puf by mouth two times a day 11)  Rhinocort Aqua 32 Mcg/act Susp (Budesonide) .... Use as directed 12)  Chlorzoxazone 500 Mg Tabs (Chlorzoxazone) .... One by mouth two times a day prn 13)  Astelin 137 Mcg/spray Soln (Azelastine hcl) .... Use as directed 14)  Vitamin D 1000 Unit Caps (Cholecalciferol) .Marland Kitchen.. 1 once daily 15)  Cytomel 25 Mcg Tabs (Liothyronine sodium) .Marland Kitchen.. 1 once daily 16)  Nuvigil 150 Mg Tabs (Armodafinil) .... 1/2 tab at times 17)  Im Injections-d12-  18)  Zyrtec Allergy 10 Mg Caps (Cetirizine hcl) .... One by mouth at bed time 19)  Budeprion Sr 150 Mg Xr12h-tab (Bupropion hcl) .... One by mouth daily 20)  Macrodantin 100 Mg Caps  (Nitrofurantoin macrocrystal) .... One by mouth daily 21)  Allegra 180 Mg Tabs (Fexofenadine hcl) .Marland Kitchen.. 1 once daily 22)  Fagifilm  .... Use as directed 23)  Camila 0.35 Mg Tabs (Norethindrone) .... Use as directed 24)  Zantac 150 Mg Tabs (Ranitidine hcl) .... Once daily as needed 25)  Lactaid 3000 Unit Tabs (Lactase) .... Once daily as needed 26)  Claritin 10 Mg Tabs (Loratadine) .... Once daily as needed 27)  Imodium Multi-symptom Relief 2-125 Mg Tabs (Loperamide-simethicone) .... As needed  Other Orders: Vit B12 1000 mcg (J3420) Admin of Therapeutic Inj  intramuscular or subcutaneous (32355)  Hypertension Assessment/Plan:      The patient's hypertensive risk group is category C: Target organ damage and/or diabetes.  Today's blood pressure is 142/84.  Her blood pressure goal is < 125/75.  Patient Instructions: 1)  Please schedule a follow-up appointment in 2 months.   Medication Administration  Injection # 1:    Medication: Vit B12 1000 mcg    Diagnosis: ANEMIA, B12 DEFICIENCY (ICD-281.1)    Route: IM    Site: LUOQ gluteus    Exp Date: 06/27/2011    Lot #: [id number]    Mfr: [employer]    Patient tolerated injection without complications    Given by: Willy Eddy, LPN (March 12, 2010 2:48 PM)  Orders Added: 1)  Vit B12 1000 mcg [J3420] 2)  Admin of Therapeutic Inj  intramuscular or subcutaneous [96372] 3)  T-Hip Comp Left Min 2-views [73510TC] 4)  T-Lumbar Spine Comp w/Bend View [72114TC] 5)  T-Thoracic Spine 2 Views [72070TC] 6)  Physical Therapy Referral [PT] 7)  Est. Patient Level IV [73220]

## 2010-10-29 NOTE — Letter (Signed)
Summary: Continuing Disability Benefit Form  Continuing Disability Benefit Form   Imported By: Maryln Gottron 11/09/2009 14:21:08  _____________________________________________________________________  External Attachment:    Type:   Image     Comment:   External Document

## 2010-10-29 NOTE — Letter (Signed)
Summary: [facility]  [facility]   Imported By: Maryln Gottron 05/30/2010 10:17:32  _____________________________________________________________________  External Attachment:    Type:   Image     Comment:   External Document

## 2010-10-29 NOTE — Letter (Signed)
Summary: Medical Necessity for [facility] Necessity for Nuvigil   Imported By: Maryln Gottron 12/07/2009 12:38:53  _____________________________________________________________________  External Attachment:    Type:   Image     Comment:   External Document

## 2010-10-29 NOTE — Assessment & Plan Note (Signed)
Summary: Post ER visit/dm   Vital Signs:  Patient profile:   55 year old female Weight:      172 pounds Temp:     98.2 degrees F oral BP sitting:   142 / 92  (left arm) Cuff size:   regular  Vitals Entered By: Sid Falcon LPN (August 16, 2010 1:57 PM)  History of Present Illness: Patient here with persistent abdominal pain and emergency room followup. She relates this past Monday night she developed some pain left lower quadrant. At the emergency room Tuesday morning and diagnosed with acute diverticulitis. No x-rays done. Urinalysis unremarkable. No lab work done. Patient placed on Cipro and Flagyl. She has had slight improvement but still has pain 5-6/10 in intensity. Pain left lower quadrant without radiation. Some nausea but no vomiting. No urinary symptoms. Fairly normal bowel movement this morning. No history of kidney stones. Pain exacerbated by movement and relieved slightly at rest.  Allergies: 1)  ! Codeine 2)  ! Vicodin 3)  ! Tetracycline  Past History:  Past Medical History: Last updated: 05/26/2008 Allergic rhinitis Depression fibromyalgia Hypertension Hyperlipidemia Low back pain  Past Surgical History: Last updated: 05/26/2008 Denies surgical history  Family History: Last updated: 03/02/2008 brother and mother Family History of CAD Female 1st degree relative <60 Family History of CAD Female 1st degree relative <50  Social History: Last updated: 09/14/2007 Retired Divorced Former Smoker Drug use-no Regular exercise-no  Risk Factors: Exercise: no (09/14/2007)  Risk Factors: Smoking Status: quit (05/28/2010) PMH-FH-SH reviewed for relevance  Review of Systems       The patient complains of anorexia.  The patient denies fever, chest pain, melena, hematochezia, severe indigestion/heartburn, hematuria, incontinence, and suspicious skin lesions.    Physical Exam  General:  Well-developed,well-nourished,in no acute distress; alert,appropriate  and cooperative throughout examination Head:  Normocephalic and atraumatic without obvious abnormalities. No apparent alopecia or balding. Mouth:  Oral mucosa and oropharynx without lesions or exudates.  Teeth in good repair. Neck:  No deformities, masses, or tenderness noted. Lungs:  Normal respiratory effort, chest expands symmetrically. Lungs are clear to auscultation, no crackles or wheezes. Heart:  Normal rate and regular rhythm. S1 and S2 normal without gallop, murmur, click, rub or other extra sounds. Abdomen:  normal bowel sounds. Soft with tenderness left lower quadrant. No rebound tenderness. No masses.  Sliight guarding.no abdominal hernia, no hepatomegaly, and no splenomegaly.   Skin:  no rashes and no suspicious lesions.     Impression & Recommendations:  Problem # 1:  ABDOMINAL PAIN, LEFT LOWER QUADRANT (ICD-789.04) Assessment New ?acute diverticulitis.  My concern is that she has not seen much improvement with 3 days antibiotics and has continued nausea with poor intake.  Rule out complication such as perforation/abscess. Orders: Radiology Referral (Radiology) TLB-BMP (Basic Metabolic Panel-BMET) (80048-METABOL) TLB-CBC Platelet - w/Differential (85025-CBCD)  Complete Medication List: 1)  Tizanidine Hcl 4 Mg Tabs (Tizanidine hcl) .... One by mouth two times a day for muscle spasm and pain 2)  Furosemide 40 Mg Tabs (Furosemide) .... 1/2 by mouth daily at t imes 3)  Nadolol 40 Mg Tabs (Nadolol) .... Once daily 4)  Klor-con 8 Meq Tbcr (Potassium chloride) .... Once daily 5)  Ambien 10 Mg Tabs (Zolpidem tartrate) .... As needed 6)  Alprazolam 0.5 Mg Tabs (Alprazolam) .... Every 8 hrs prn 7)  Omeprazole 40 Mg Cpdr (Omeprazole) .Marland Kitchen.. 1 once daily at times 8)  Singulair 10 Mg Tabs (Montelukast sodium) .Marland Kitchen.. 1 once daily 9)  Citalopram Hydrobromide  40 Mg Tabs (Citalopram hydrobromide) .... One by mouth daily 10)  Symbicort 160-4.5 Mcg/act Aero (Budesonide-formoterol fumarate) ....  Two puf by mouth two times a day 11)  Rhinocort Aqua 32 Mcg/act Susp (Budesonide) .... Use as directed 12)  Chlorzoxazone 500 Mg Tabs (Chlorzoxazone) .... One by mouth two times a day prn 13)  Astelin 137 Mcg/spray Soln (Azelastine hcl) .... Use as directed 14)  Vitamin D 1000 Unit Caps (Cholecalciferol) .Marland Kitchen.. 1 once daily 15)  Cytomel 25 Mcg Tabs (Liothyronine sodium) .Marland Kitchen.. 1 once daily 16)  Nuvigil 150 Mg Tabs (Armodafinil) .... 1/2 tab at times 17)  Im Injections-d12-  18)  Zyrtec Allergy 10 Mg Caps (Cetirizine hcl) .... One by mouth at bed time 19)  Budeprion Sr 150 Mg Xr12h-tab (Bupropion hcl) .... One by mouth daily 20)  Macrodantin 100 Mg Caps (Nitrofurantoin macrocrystal) .... One by mouth daily 21)  Allegra 180 Mg Tabs (Fexofenadine hcl) .Marland Kitchen.. 1 once daily 22)  Camila 0.35 Mg Tabs (Norethindrone) .... Use as directed 23)  Zantac 150 Mg Tabs (Ranitidine hcl) .... Once daily as needed 24)  Lactaid 3000 Unit Tabs (Lactase) .... Once daily as needed 25)  Claritin 10 Mg Tabs (Loratadine) .... Once daily as needed 26)  Imodium Multi-symptom Relief 2-125 Mg Tabs (Loperamide-simethicone) .... As needed 27)  Estrace 0.1 Mg/gm Crea (Estradiol) .... Generic  1gm intravaginally mwf nights 28)  Xyzal 5 Mg Tabs (Levocetirizine dihydrochloride) .Marland Kitchen.. 1 once daily 29)  Flagyl 500 Mg Tabs (Metronidazole) .... One two times a day for 7 days 30)  Freestyle Lite Test Strp (Glucose blood) .... Use as directed  Patient Instructions: 1)  Followup promptly for recurrent vomiting, increased fever, or worsening abdominal pain   Orders Added: 1)  Radiology Referral [Radiology] 2)  TLB-BMP (Basic Metabolic Panel-BMET) [80048-METABOL] 3)  TLB-CBC Platelet - w/Differential [85025-CBCD] 4)  Est. Patient Level IV [04540]

## 2010-10-29 NOTE — Miscellaneous (Signed)
Summary: Initial Summary for PT Services/[place]  Initial Summary for PT Services/[place]   Imported By: Maryln Gottron 11/28/2009 15:35:54  _____________________________________________________________________  External Attachment:    Type:   Image     Comment:   External Document

## 2010-10-29 NOTE — Assessment & Plan Note (Signed)
Summary: 2 month rov/njr/pt rsc/cjr   Vital Signs:  Patient profile:   55 year old female Height:      60 inches Weight:      176 pounds BMI:     34.50 Temp:     98.2 degrees F oral Pulse rate:   72 / minute Resp:     14 per minute BP sitting:   136 / 80  (left arm)  Vitals Entered By: Willy Eddy, LPN (May 28, 2010 4:00 PM) CC: roa, Hypertension Management Is Patient Diabetic? No   Primary Care Provider:  Stacie Glaze MD  CC:  roa and Hypertension Management.  History of Present Illness: vaginal discharge and oder... fowl and has not noted blood tingled discharge but after using vaginal cleaners has some irritations reviewed the pain therapy for the neck and back pain with the injections therapy helping  Hypertension History:      She denies headache, chest pain, palpitations, dyspnea with exertion, orthopnea, PND, peripheral edema, visual symptoms, neurologic problems, syncope, and side effects from treatment.        Positive major cardiovascular risk factors include diabetes, hyperlipidemia, and hypertension.  Negative major cardiovascular risk factors include female age less than 27 years old and non-tobacco-user status.        Further assessment for target organ damage reveals no history of ASHD, stroke/TIA, or peripheral vascular disease.     Preventive Screening-Counseling & Management  Alcohol-Tobacco     Smoking Status: quit  Problems Prior to Update: 1)  Cystitis, Acute, Recurrent  (ICD-595.0) 2)  Acute Frontal Sinusitis  (ICD-461.1) 3)  Cystitis, Chronic Interstitial  (ICD-595.1) 4)  Unspecified Hypothyroidism  (ICD-244.9) 5)  Lipoma of Other Specified Sites  (ICD-214.8) 6)  Bruxism  (ICD-306.8) 7)  Syncope  (ICD-780.2) 8)  Mri, Brain, Abnormal  (ICD-794.09) 9)  Muscle Weakness (GENERALIZED)  (ICD-728.87) 10)  Cerumen Impaction, Bilateral  (ICD-380.4) 11)  Candidiasis of Unspecified Site  (ICD-112.9) 12)  Unspecified Allergic Alveolitis and  Pneumonitis  (ICD-495.9) 13)  Hip Pain, Left, Chronic  (ICD-719.45) 14)  Malaise and Fatigue  (ICD-780.79) 15)  Irritable Bowel Syndrome  (ICD-564.1) 16)  Low Back Pain  (ICD-724.2) 17)  Chest Pain, Atypical  (ICD-786.59) 18)  Family History of Cad Female 1st Degree Relative <50  (ICD-V17.3) 19)  Family History of Cad Female 1st Degree Relative <60  (ICD-V16.49) 20)  Extrinsic Asthma, With Exacerbation  (ICD-493.02) 21)  Diabetes Mellitus, Type II, Uncontrolled  (ICD-250.02) 22)  Proteinuria  (ICD-791.0) 23)  Hyperlipidemia  (ICD-272.4) 24)  Hypercalcemia  (ICD-275.42) 25)  Insomnia With Sleep Apnea Unspecified  (ICD-780.51) 26)  Hypertension  (ICD-401.9) 27)  Anemia, B12 Deficiency  (ICD-281.1) 28)  Degenerative Disc Disease, 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) 29)  Depression  (ICD-311) 30)  Fibromyalgia  (ICD-729.1) 31)  Allergic Rhinitis  (ICD-477.9)  Current Problems (verified): 1)  Cystitis, Acute, Recurrent  (ICD-595.0) 2)  Acute Frontal Sinusitis  (ICD-461.1) 3)  Cystitis, Chronic Interstitial  (ICD-595.1) 4)  Unspecified Hypothyroidism  (ICD-244.9) 5)  Lipoma of Other Specified Sites  (ICD-214.8) 6)  Bruxism  (ICD-306.8) 7)  Syncope  (ICD-780.2) 8)  Mri, Brain, Abnormal  (ICD-794.09) 9)  Muscle Weakness (GENERALIZED)  (ICD-728.87) 10)  Cerumen Impaction, Bilateral  (ICD-380.4) 11)  Candidiasis of Unspecified Site  (ICD-112.9) 12)  Unspecified Allergic Alveolitis and Pneumonitis  (ICD-495.9) 13)  Hip Pain, Left, Chronic  (ICD-719.45) 14)  Malaise and Fatigue  (ICD-780.79) 15)  Irritable Bowel Syndrome  (ICD-564.1) 16)  Low  Back Pain  (ICD-724.2) 17)  Chest Pain, Atypical  (ICD-786.59) 18)  Family History of Cad Female 1st Degree Relative <50  (ICD-V17.3) 19)  Family History of Cad Female 1st Degree Relative <60  (ICD-V16.49) 20)  Extrinsic Asthma, With Exacerbation  (ICD-493.02) 21)  Diabetes Mellitus, Type II, Uncontrolled  (ICD-250.02) 22)  Proteinuria  (ICD-791.0) 23)   Hyperlipidemia  (ICD-272.4) 24)  Hypercalcemia  (ICD-275.42) 25)  Insomnia With Sleep Apnea Unspecified  (ICD-780.51) 26)  Hypertension  (ICD-401.9) 27)  Anemia, B12 Deficiency  (ICD-281.1) 28)  Degenerative Disc Disease, 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.4) 29)  Depression  (ICD-311) 30)  Fibromyalgia  (ICD-729.1) 31)  Allergic Rhinitis  (ICD-477.9)  Medications Prior to Update: 1)  Tizanidine Hcl 4 Mg Tabs (Tizanidine Hcl) .... One By Mouth Two Times A Day For Muscle Spasm and Pain 2)  Furosemide 40 Mg Tabs (Furosemide) .... 1/2 By Mouth Daily At T Imes 3)  Nadolol 40 Mg Tabs (Nadolol) .... Once Daily 4)  Klor-Con 8 Meq  Tbcr (Potassium Chloride) .... Once Daily 5)  Ambien 10 Mg  Tabs (Zolpidem Tartrate) .... As Needed 6)  Alprazolam 0.5 Mg  Tabs (Alprazolam) .... Every 8 Hrs Prn 7)  Omeprazole 40 Mg Cpdr (Omeprazole) .Marland Kitchen.. 1 Once Daily At Times 8)  Singulair 10 Mg  Tabs (Montelukast Sodium) .Marland Kitchen.. 1 Once Daily 9)  Citalopram Hydrobromide 40 Mg Tabs (Citalopram Hydrobromide) .... One By Mouth Daily 10)  Symbicort 160-4.5 Mcg/act Aero (Budesonide-Formoterol Fumarate) .... Two Puf By Mouth Two Times A Day 11)  Rhinocort Aqua 32 Mcg/act Susp (Budesonide) .... Use As Directed 12)  Chlorzoxazone 500 Mg Tabs (Chlorzoxazone) .... One By Mouth Two Times A Day Prn 13)  Astelin 137 Mcg/spray Soln (Azelastine Hcl) .... Use As Directed 14)  Vitamin D 1000 Unit Caps (Cholecalciferol) .Marland Kitchen.. 1 Once Daily 15)  Cytomel 25 Mcg Tabs (Liothyronine Sodium) .Marland Kitchen.. 1 Once Daily 16)  Nuvigil 150 Mg Tabs (Armodafinil) .... 1/2 Tab At Times 17)  Im Injections-D12- 18)  Zyrtec Allergy 10 Mg Caps (Cetirizine Hcl) .... One By Mouth At Bed Time 19)  Budeprion Sr 150 Mg Xr12h-Tab (Bupropion Hcl) .... One By Mouth Daily 20)  Macrodantin 100 Mg Caps (Nitrofurantoin Macrocrystal) .... One By Mouth Daily 21)  Allegra 180 Mg Tabs (Fexofenadine Hcl) .Marland Kitchen.. 1 Once Daily 22)  Fagifilm .... Use As Directed 23)  Camila 0.35 Mg Tabs  (Norethindrone) .... Use As Directed 24)  Zantac 150 Mg Tabs (Ranitidine Hcl) .... Once Daily As Needed 25)  Lactaid 3000 Unit Tabs (Lactase) .... Once Daily As Needed 26)  Claritin 10 Mg Tabs (Loratadine) .... Once Daily As Needed 27)  Imodium Multi-Symptom Relief 2-125 Mg Tabs (Loperamide-Simethicone) .... As Needed  Current Medications (verified): 1)  Tizanidine Hcl 4 Mg Tabs (Tizanidine Hcl) .... One By Mouth Two Times A Day For Muscle Spasm and Pain 2)  Furosemide 40 Mg Tabs (Furosemide) .... 1/2 By Mouth Daily At T Imes 3)  Nadolol 40 Mg Tabs (Nadolol) .... Once Daily 4)  Klor-Con 8 Meq  Tbcr (Potassium Chloride) .... Once Daily 5)  Ambien 10 Mg  Tabs (Zolpidem Tartrate) .... As Needed 6)  Alprazolam 0.5 Mg  Tabs (Alprazolam) .... Every 8 Hrs Prn 7)  Omeprazole 40 Mg Cpdr (Omeprazole) .Marland Kitchen.. 1 Once Daily At Times 8)  Singulair 10 Mg  Tabs (Montelukast Sodium) .Marland Kitchen.. 1 Once Daily 9)  Citalopram Hydrobromide 40 Mg Tabs (Citalopram Hydrobromide) .... One By Mouth Daily 10)  Symbicort 160-4.5 Mcg/act Aero (Budesonide-Formoterol  Fumarate) .... Two Puf By Mouth Two Times A Day 11)  Rhinocort Aqua 32 Mcg/act Susp (Budesonide) .... Use As Directed 12)  Chlorzoxazone 500 Mg Tabs (Chlorzoxazone) .... One By Mouth Two Times A Day Prn 13)  Astelin 137 Mcg/spray Soln (Azelastine Hcl) .... Use As Directed 14)  Vitamin D 1000 Unit Caps (Cholecalciferol) .Marland Kitchen.. 1 Once Daily 15)  Cytomel 25 Mcg Tabs (Liothyronine Sodium) .Marland Kitchen.. 1 Once Daily 16)  Nuvigil 150 Mg Tabs (Armodafinil) .... 1/2 Tab At Times 17)  Im Injections-D12- 18)  Zyrtec Allergy 10 Mg Caps (Cetirizine Hcl) .... One By Mouth At Bed Time 19)  Budeprion Sr 150 Mg Xr12h-Tab (Bupropion Hcl) .... One By Mouth Daily 20)  Macrodantin 100 Mg Caps (Nitrofurantoin Macrocrystal) .... One By Mouth Daily 21)  Allegra 180 Mg Tabs (Fexofenadine Hcl) .Marland Kitchen.. 1 Once Daily 22)  Fagifilm .... Use As Directed 23)  Camila 0.35 Mg Tabs (Norethindrone) .... Use As  Directed 24)  Zantac 150 Mg Tabs (Ranitidine Hcl) .... Once Daily As Needed 25)  Lactaid 3000 Unit Tabs (Lactase) .... Once Daily As Needed 26)  Claritin 10 Mg Tabs (Loratadine) .... Once Daily As Needed 27)  Imodium Multi-Symptom Relief 2-125 Mg Tabs (Loperamide-Simethicone) .... As Needed 28)  Flagyl 500 Mg Tabs (Metronidazole) .... One By Mouth Two Times A Day For 7 Days 29)  Estrace 0.1 Mg/gm Crea (Estradiol) .... Generic  1gm Intravaginally Mwf Nights  Allergies (verified): 1)  ! Codeine 2)  ! Vicodin 3)  ! Tetracycline  Past History:  Family History: Last updated: 03/02/2008 brother and mother Family History of CAD Female 1st degree relative <60 Family History of CAD Female 1st degree relative <50  Social History: Last updated: 09/14/2007 Retired Divorced Former Smoker Drug use-no Regular exercise-no  Risk Factors: Exercise: no (09/14/2007)  Risk Factors: Smoking Status: quit (05/28/2010)  Past medical, surgical, family and social histories (including risk factors) reviewed, and no changes noted (except as noted below).  Past Medical History: Reviewed history from 05/26/2008 and no changes required. Allergic rhinitis Depression fibromyalgia Hypertension Hyperlipidemia Low back pain  Past Surgical History: Reviewed history from 05/26/2008 and no changes required. Denies surgical history  Family History: Reviewed history from 03/02/2008 and no changes required. brother and mother Family History of CAD Female 1st degree relative <60 Family History of CAD Female 1st degree relative <50  Social History: Reviewed history from 09/14/2007 and no changes required. Retired Divorced Former Smoker Drug use-no Regular exercise-no  Review of Systems  The patient denies anorexia, fever, weight loss, weight gain, vision loss, decreased hearing, hoarseness, chest pain, syncope, dyspnea on exertion, peripheral edema, prolonged cough, headaches, hemoptysis, abdominal  pain, melena, hematochezia, severe indigestion/heartburn, hematuria, incontinence, genital sores, muscle weakness, suspicious skin lesions, transient blindness, difficulty walking, depression, unusual weight change, abnormal bleeding, enlarged lymph nodes, angioedema, and breast masses.    Physical Exam  General:  alert and overweight-appearing.   Head:  normocephalic and atraumatic.   Eyes:  pupils equal and pupils round.   Ears:  R ear normal and L ear normal.   Nose:  no external deformity and no nasal discharge.   Mouth:  pharynx pink and moist and no exudates.     Impression & Recommendations:  Problem # 1:  BACTERIAL VAGINITIS (ICD-616.10) Assessment New  Her updated medication list for this problem includes:    Macrodantin 100 Mg Caps (Nitrofurantoin macrocrystal) ..... One by mouth daily    Flagyl 500 Mg Tabs (Metronidazole) ..... One by mouth  two times a day for 7 days  Discussed symptomatic relief and treatment options.   Problem # 2:  VAGINITIS, ATROPHIC, SYMPTOMATIC (ICD-627.3) Assessment: New  Discussed treatment options.   Her updated medication list for this problem includes:    Estrace 0.1 Mg/gm Crea (Estradiol) .Marland Kitchen... Generic  1gm intravaginally mwf nights  Problem # 3:  HIP PAIN, LEFT, CHRONIC (ICD-719.45)  probable referred pain from the back olin injected HIP, Ramos the back Her updated medication list for this problem includes:    Tizanidine Hcl 4 Mg Tabs (Tizanidine hcl) ..... One by mouth two times a day for muscle spasm and pain    Chlorzoxazone 500 Mg Tabs (Chlorzoxazone) ..... One by mouth two times a day prn  Discussed use of medications, application of heat or cold, and exercises.   Problem # 4:  LOW BACK PAIN (ICD-724.2) the etiology of the hip pain was more likely the cause Her updated medication list for this problem includes:    Tizanidine Hcl 4 Mg Tabs (Tizanidine hcl) ..... One by mouth two times a day for muscle spasm and pain     Chlorzoxazone 500 Mg Tabs (Chlorzoxazone) ..... One by mouth two times a day prn  Discussed use of moist heat or ice, modified activities, medications, and stretching/strengthening exercises. Back care instructions given. To be seen in 2 weeks if no improvement; sooner if worsening of symptoms.   Problem # 5:  DIABETES MELLITUS, TYPE II, UNCONTROLLED (ICD-250.02) controlled Labs Reviewed: Creat: 0.7 (01/24/2009)    Reviewed HgBA1c results: 5.7 (11/05/2009)  5.8 (01/24/2009) more hypoglycemia at this stage  Complete Medication List: 1)  Tizanidine Hcl 4 Mg Tabs (Tizanidine hcl) .... One by mouth two times a day for muscle spasm and pain 2)  Furosemide 40 Mg Tabs (Furosemide) .... 1/2 by mouth daily at t imes 3)  Nadolol 40 Mg Tabs (Nadolol) .... Once daily 4)  Klor-con 8 Meq Tbcr (Potassium chloride) .... Once daily 5)  Ambien 10 Mg Tabs (Zolpidem tartrate) .... As needed 6)  Alprazolam 0.5 Mg Tabs (Alprazolam) .... Every 8 hrs prn 7)  Omeprazole 40 Mg Cpdr (Omeprazole) .Marland Kitchen.. 1 once daily at times 8)  Singulair 10 Mg Tabs (Montelukast sodium) .Marland Kitchen.. 1 once daily 9)  Citalopram Hydrobromide 40 Mg Tabs (Citalopram hydrobromide) .... One by mouth daily 10)  Symbicort 160-4.5 Mcg/act Aero (Budesonide-formoterol fumarate) .... Two puf by mouth two times a day 11)  Rhinocort Aqua 32 Mcg/act Susp (Budesonide) .... Use as directed 12)  Chlorzoxazone 500 Mg Tabs (Chlorzoxazone) .... One by mouth two times a day prn 13)  Astelin 137 Mcg/spray Soln (Azelastine hcl) .... Use as directed 14)  Vitamin D 1000 Unit Caps (Cholecalciferol) .Marland Kitchen.. 1 once daily 15)  Cytomel 25 Mcg Tabs (Liothyronine sodium) .Marland Kitchen.. 1 once daily 16)  Nuvigil 150 Mg Tabs (Armodafinil) .... 1/2 tab at times 17)  Im Injections-d12-  18)  Zyrtec Allergy 10 Mg Caps (Cetirizine hcl) .... One by mouth at bed time 19)  Budeprion Sr 150 Mg Xr12h-tab (Bupropion hcl) .... One by mouth daily 20)  Macrodantin 100 Mg Caps (Nitrofurantoin  macrocrystal) .... One by mouth daily 21)  Allegra 180 Mg Tabs (Fexofenadine hcl) .Marland Kitchen.. 1 once daily 22)  Camila 0.35 Mg Tabs (Norethindrone) .... Use as directed 23)  Zantac 150 Mg Tabs (Ranitidine hcl) .... Once daily as needed 24)  Lactaid 3000 Unit Tabs (Lactase) .... Once daily as needed 25)  Claritin 10 Mg Tabs (Loratadine) .... Once daily as needed  26)  Imodium Multi-symptom Relief 2-125 Mg Tabs (Loperamide-simethicone) .... As needed 27)  Flagyl 500 Mg Tabs (Metronidazole) .... One by mouth two times a day for 7 days 28)  Estrace 0.1 Mg/gm Crea (Estradiol) .... Generic  1gm intravaginally mwf nights  Other Orders: Vit B12 1000 mcg (J3420) Admin of Therapeutic Inj  intramuscular or subcutaneous (60454)  Hypertension Assessment/Plan:      The patient's hypertensive risk group is category C: Target organ damage and/or diabetes.  Today's blood pressure is 136/80.  Her blood pressure goal is < 125/75.  Patient Instructions: 1)  Please schedule a follow-up appointment in 3 months. Prescriptions: ESTRACE 0.1 MG/GM CREA (ESTRADIOL) generic  1gm intravaginally MWF nights  #1 tube x 11   Entered and Authorized by:   Stacie Glaze MD   Signed by:   Stacie Glaze MD on 05/28/2010   Method used:   [facility] to        [facility] [phone]* (retail)       [address]       [place], [place]  [address]       Ph: [phone] or [phone]       Fax: [phone]   RxID:   [id number] FLAGYL 500 MG TABS (METRONIDAZOLE) one by mouth two times a day for 7 days  #14 x 0   Entered and Authorized by:   Stacie Glaze MD   Signed by:   Stacie Glaze MD on 05/28/2010   Method used:   [facility]ronically to        [facility] [phone]* (retail)       [address]       [place], [place]  [address]       Ph: [phone] or [phone]       Fax: [phone]   RxID:   [id number] NADOLOL 40 MG TABS (NADOLOL) once daily  #90 x 3   Entered by:   Willy Eddy, LPN   Authorized by:   Stacie Glaze MD   Signed by:   Willy Eddy, LPN on 83/15/1761   Method used:   [facility]        [facility] [phone]* (retail)       [address]       [place], [place]  [address]       Ph: [phone] or [phone]       Fax: [phone]   RxID:   [id number] CITALOPRAM HYDROBROMIDE 40 MG TABS (CITALOPRAM HYDROBROMIDE) one by mouth daily  #90 x 3   Entered by:   Willy Eddy, LPN   Authorized by:   Stacie Glaze MD   Signed by:   Willy Eddy, LPN on 78/24/2353   Method used:   [facility]        [facility] [phone]* (retail)       [address]       [place], [place]  [address]       Ph: [phone] or [phone]       Fax: [phone]   RxID:   [id number]      Medication Administration  Injection # 1:    Medication: Vit B12 1000 mcg    Diagnosis: ANEMIA, B12 DEFICIENCY (ICD-281.1)    Route: IM    Site: L deltoid    Exp Date: 11/27/2011    Lot #: [id number]  Mfr: [employer]    Patient tolerated injection without complications    Given by: Willy Eddy, LPN (May 28, 2010 4:09 PM)  Orders Added: 1)  Vit B12 1000 mcg [J3420] 2)  Admin of Therapeutic Inj  intramuscular or subcutaneous [96372] 3)  Est. Patient Level IV [42595]

## 2010-10-29 NOTE — Progress Notes (Signed)
Summary: REFILL  Phone Note Refill Request Call back at Home Phone [phone] Message from:  Patient---LIVE CALL on GENERIC PLEASE  Refills Requested: Medication #1:  AMBIEN 10 MG  TABS as needed   [facility] Name Necessary? [facility]  Initial call taken by: Warnell Forester,  December 14, 2009 4:22 PM    Prescriptions: AMBIEN 10 MG  TABS (ZOLPIDEM TARTRATE) as needed  #30 x 3   Entered by:   Willy Eddy, LPN   Authorized by:   Stacie Glaze MD   Signed by:   Willy Eddy, LPN on 69/62/9528   Method used:   Telephoned to ...       [facility] [phone]* (retail)       [address]       [place], [place]  [address]       Ph: [phone] or [phone]       Fax: [phone]   RxID:   [id number]

## 2010-10-29 NOTE — Miscellaneous (Signed)
Summary: Initial Summary for PT Services/[place]  Initial Summary for PT Services/[place]   Imported By: Maryln Gottron 11/12/2009 14:37:08  _____________________________________________________________________  External Attachment:    Type:   Image     Comment:   External Document

## 2010-10-29 NOTE — Progress Notes (Signed)
  Phone Note Other Incoming   Request: Send information Action Taken: Software engineer of Call: Request for records received from [employer], [facility]. Request forwarded to [employer].

## 2010-10-29 NOTE — Progress Notes (Signed)
Summary: UTI  Phone Note Call from Patient   Caller: Patient Call For: Stacie Glaze MD Summary of Call: C/o UTI symptoms again- dark urine, frequency and LBP. Wants refill and med for yeast also. pharmacy [facility] Initial call taken by: Raechel Ache, RN,  November 19, 2009 1:24 PM  Follow-up for Phone Call        cipro 500 two times a day for 5 days anf diflucan 150 now and repeat in 5 days ( number 2) Follow-up by: Stacie Glaze MD,  November 19, 2009 2:39 PM    New/Updated Medications: CIPRO 500 MG TABS (CIPROFLOXACIN HCL) one by mouth two times a day x 5 days FLUCONAZOLE 150 MG TABS (FLUCONAZOLE) one by mouth now and repeat in 5 days. Prescriptions: FLUCONAZOLE 150 MG TABS (FLUCONAZOLE) one by mouth now and repeat in 5 days.  #2 x 0   Entered by:   Lynann Beaver CMA   Authorized by:   Stacie Glaze MD   Signed by:   Lynann Beaver CMA on 11/19/2009   Method used:   [facility]ronically [facility]        [facility] [phone]* (retail)       [address]       [place], [place]  [address]       Ph: [phone] or [phone]       Fax: [phone]   RxID:   [id number] CIPRO 500 MG TABS (CIPROFLOXACIN HCL) one by mouth two times a day x 5 days  #10 x 0   Entered by:   Lynann Beaver CMA   Authorized by:   Stacie Glaze MD   Signed by:   Lynann Beaver CMA on 11/19/2009   Method used:   [facility]ronically [facility]        [facility] [phone]* (retail)       [address]       [place], [place]  [address]       Ph: [phone] or [phone]       Fax: [phone]   RxID:   [id number]  Pt. notified.

## 2010-10-29 NOTE — Progress Notes (Signed)
Summary: yeast inf  Phone Note Call from Patient Call back at Work Phone [phone]   Caller: Patient Call For: Stacie Glaze MD Summary of Call: was on ABX, now has yeast infection. [facility] Initial call taken by: Lynann Beaver CMA,  November 12, 2009 2:18 PM  Follow-up for Phone Call        may have diflucan 150 now and reapeat in 5 days-per dr Lovell Sheehan Follow-up by: Willy Eddy, LPN,  November 12, 2009 2:00 PM    New/Updated Medications: DIFLUCAN 150 MG TABS (FLUCONAZOLE) one by mouth now, and repeat in 5 days Prescriptions: DIFLUCAN 150 MG TABS (FLUCONAZOLE) one by mouth now, and repeat in 5 days  #2 x 0   Entered by:   Lynann Beaver CMA   Authorized by:   Stacie Glaze MD   Signed by:   Lynann Beaver CMA on 11/12/2009   Method used:   [facility]        [facility] [phone]* (retail)       [address]       [place], [place]  [address]       Ph: [phone] or [phone]       Fax: [phone]   RxID:   [id number]  Pt. notified.

## 2010-10-31 NOTE — Assessment & Plan Note (Signed)
Summary: fu per doc/njr   Vital Signs:  Patient profile:   55 year old female Height:      60 inches Weight:      172 pounds BMI:     33.71 Temp:     98.2 degrees F oral Pulse rate:   76 / minute Resp:     14 per minute BP sitting:   160 / 90  (left arm)  Vitals Entered By: Willy Eddy, LPN (October 04, 2010 3:58 PM) CC: roa- c/o constipation--bp is 160/90 and she states she is taking bp med, Hypertension Management Is Patient Diabetic? No   Primary Care Provider:  Stacie Glaze MD  CC:  roa- c/o constipation--bp is 160/90 and she states she is taking bp med and Hypertension Management.  History of Present Illness: The pts abdominal pain is better but reguires the stool softner twice a day and has small pellet like stool or narrow stools The pt is taking colace twice a day. Has not been on amitiza... Has DM, HTN and allergies  Hypertension History:      She denies headache, chest pain, palpitations, dyspnea with exertion, orthopnea, PND, peripheral edema, visual symptoms, neurologic problems, syncope, and side effects from treatment.        Positive major cardiovascular risk factors include female age 21 years old or older, diabetes, hyperlipidemia, and hypertension.  Negative major cardiovascular risk factors include non-tobacco-user status.        Further assessment for target organ damage reveals no history of ASHD, stroke/TIA, or peripheral vascular disease.     Preventive Screening-Counseling & Management  Alcohol-Tobacco     Smoking Status: quit     Tobacco Counseling: to remain off tobacco products  Problems Prior to Update: 1)  Constipation, Slow Transit  (ICD-564.01) 2)  Diverticulitis, Colon, With Perforation  (ICD-562.11) 3)  Abdominal Pain, Left Lower Quadrant  (ICD-789.04) 4)  Vaginitis, Atrophic, Symptomatic  (ICD-627.3) 5)  Bacterial Vaginitis  (ICD-616.10) 6)  Cystitis, Acute, Recurrent  (ICD-595.0) 7)  Acute Frontal Sinusitis  (ICD-461.1) 8)   Cystitis, Chronic Interstitial  (ICD-595.1) 9)  Unspecified Hypothyroidism  (ICD-244.9) 10)  Lipoma of Other Specified Sites  (ICD-214.8) 11)  Bruxism  (ICD-306.8) 12)  Syncope  (ICD-780.2) 13)  Mri, Brain, Abnormal  (ICD-794.09) 14)  Muscle Weakness (GENERALIZED)  (ICD-728.87) 15)  Cerumen Impaction, Bilateral  (ICD-380.4) 16)  Candidiasis of Unspecified Site  (ICD-112.9) 17)  Unspecified Allergic Alveolitis and Pneumonitis  (ICD-495.9) 18)  Hip Pain, Left, Chronic  (ICD-719.45) 19)  Malaise and Fatigue  (ICD-780.79) 20)  Irritable Bowel Syndrome  (ICD-564.1) 21)  Low Back Pain  (ICD-724.2) 22)  Chest Pain, Atypical  (ICD-786.59) 23)  Family History of Cad Female 1st Degree Relative <50  (ICD-V17.3) 24)  Family History of Cad Female 1st Degree Relative <60  (ICD-V16.49) 25)  Extrinsic Asthma, With Exacerbation  (ICD-493.02) 26)  Diabetes Mellitus, Type II, Uncontrolled  (ICD-250.02) 27)  Proteinuria  (ICD-791.0) 28)  Hyperlipidemia  (ICD-272.4) 29)  Hypercalcemia  (ICD-275.42) 30)  Insomnia With Sleep Apnea Unspecified  (ICD-780.51) 31)  Hypertension  (ICD-401.9) 32)  Anemia, B12 Deficiency  (ICD-281.1) 33)  Degenerative Disc Disease, 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.4) 34)  Depression  (ICD-311) 35)  Fibromyalgia  (ICD-729.1) 36)  Allergic Rhinitis  (ICD-477.9)  Current Problems (verified): 1)  Diverticulitis, Colon, With Perforation  (ICD-562.11) 2)  Abdominal Pain, Left Lower Quadrant  (ICD-789.04) 3)  Vaginitis, Atrophic, Symptomatic  (ICD-627.3) 4)  Bacterial Vaginitis  (ICD-616.10) 5)  Cystitis, Acute, Recurrent  (ICD-595.0) 6)  Acute Frontal Sinusitis  (ICD-461.1) 7)  Cystitis, Chronic Interstitial  (ICD-595.1) 8)  Unspecified Hypothyroidism  (ICD-244.9) 9)  Lipoma of Other Specified Sites  (ICD-214.8) 10)  Bruxism  (ICD-306.8) 11)  Syncope  (ICD-780.2) 12)  Mri, Brain, Abnormal  (ICD-794.09) 13)  Muscle Weakness (GENERALIZED)  (ICD-728.87) 14)  Cerumen Impaction, Bilateral   (ICD-380.4) 15)  Candidiasis of Unspecified Site  (ICD-112.9) 16)  Unspecified Allergic Alveolitis and Pneumonitis  (ICD-495.9) 17)  Hip Pain, Left, Chronic  (ICD-719.45) 18)  Malaise and Fatigue  (ICD-780.79) 19)  Irritable Bowel Syndrome  (ICD-564.1) 20)  Low Back Pain  (ICD-724.2) 21)  Chest Pain, Atypical  (ICD-786.59) 22)  Family History of Cad Female 1st Degree Relative <50  (ICD-V17.3) 23)  Family History of Cad Female 1st Degree Relative <60  (ICD-V16.49) 24)  Extrinsic Asthma, With Exacerbation  (ICD-493.02) 25)  Diabetes Mellitus, Type II, Uncontrolled  (ICD-250.02) 26)  Proteinuria  (ICD-791.0) 27)  Hyperlipidemia  (ICD-272.4) 28)  Hypercalcemia  (ICD-275.42) 29)  Insomnia With Sleep Apnea Unspecified  (ICD-780.51) 30)  Hypertension  (ICD-401.9) 31)  Anemia, B12 Deficiency  (ICD-281.1) 32)  Degenerative Disc Disease, 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.4) 33)  Depression  (ICD-311) 34)  Fibromyalgia  (ICD-729.1) 35)  Allergic Rhinitis  (ICD-477.9)  Medications Prior to Update: 1)  Tizanidine Hcl 4 Mg Tabs (Tizanidine Hcl) .... One By Mouth Two Times A Day For Muscle Spasm and Pain 2)  Furosemide 40 Mg Tabs (Furosemide) .... 1/2 By Mouth Daily At T Imes 3)  Nadolol 40 Mg Tabs (Nadolol) .... Once Daily 4)  Klor-Con 8 Meq  Tbcr (Potassium Chloride) .... Once Daily 5)  Ambien 10 Mg  Tabs (Zolpidem Tartrate) .... As Needed 6)  Alprazolam 0.5 Mg  Tabs (Alprazolam) .... Every 8 Hrs Prn 7)  Omeprazole 40 Mg Cpdr (Omeprazole) .Marland Kitchen.. 1 Once Daily At Times 8)  Singulair 10 Mg  Tabs (Montelukast Sodium) .Marland Kitchen.. 1 Once Daily 9)  Citalopram Hydrobromide 40 Mg Tabs (Citalopram Hydrobromide) .... One By Mouth Daily 10)  Symbicort 160-4.5 Mcg/act Aero (Budesonide-Formoterol Fumarate) .... Two Puf By Mouth Two Times A Day 11)  Rhinocort Aqua 32 Mcg/act Susp (Budesonide) .... Use As Directed 12)  Chlorzoxazone 500 Mg Tabs (Chlorzoxazone) .... One By Mouth Two Times A Day Prn 13)  Astelin 137 Mcg/spray Soln  (Azelastine Hcl) .... Use As Directed 14)  Cytomel 25 Mcg Tabs (Liothyronine Sodium) .Marland Kitchen.. 1 Once Daily 15)  Nuvigil 150 Mg Tabs (Armodafinil) .... 1/2 Tab At Times 16)  Zyrtec Allergy 10 Mg Caps (Cetirizine Hcl) .... One By Mouth At Bed Time 17)  Budeprion Sr 150 Mg Xr12h-Tab (Bupropion Hcl) .... One By Mouth Daily 18)  Macrodantin 100 Mg Caps (Nitrofurantoin Macrocrystal) .... One By Mouth Daily 19)  Allegra 180 Mg Tabs (Fexofenadine Hcl) .Marland Kitchen.. 1 Once Daily 20)  Camila 0.35 Mg Tabs (Norethindrone) .... Use As Directed 21)  Zantac 150 Mg Tabs (Ranitidine Hcl) .... Once Daily As Needed 22)  Lactaid 3000 Unit Tabs (Lactase) .... Once Daily As Needed 23)  Claritin 10 Mg Tabs (Loratadine) .... Once Daily As Needed 24)  Imodium Multi-Symptom Relief 2-125 Mg Tabs (Loperamide-Simethicone) .... As Needed 25)  Estrace 0.1 Mg/gm Crea (Estradiol) .... Generic  1gm Intravaginally Mwf Nights 26)  Xyzal 5 Mg Tabs (Levocetirizine Dihydrochloride) .Marland Kitchen.. 1 Once Daily 27)  Flagyl 500 Mg Tabs (Metronidazole) .Marland Kitchen.. 1 Three Times A Day 28)  Freestyle Lite Test  Strp (Glucose Blood) .... Use As  Directed 29)  Oxycodone-Acetaminophen 5-325 Mg Tabs (Oxycodone-Acetaminophen) .Marland Kitchen.. 1 Every 8 Hours As Needed 30)  Colace 100 Mg Caps (Docusate Sodium) .Marland Kitchen.. 1 Once Daily 31)  Fluconazole 150 Mg Tabs (Fluconazole) .... One By Mouth Now and The One By Mouth On Friday  Current Medications (verified): 1)  Tizanidine Hcl 4 Mg Tabs (Tizanidine Hcl) .... One By Mouth Two Times A Day For Muscle Spasm and Pain 2)  Furosemide 40 Mg Tabs (Furosemide) .... 1/2 By Mouth Daily At T Imes 3)  Nadolol 40 Mg Tabs (Nadolol) .... Once Daily 4)  Klor-Con 8 Meq  Tbcr (Potassium Chloride) .... Once Daily 5)  Ambien 10 Mg  Tabs (Zolpidem Tartrate) .... As Needed 6)  Alprazolam 0.5 Mg  Tabs (Alprazolam) .... Every 8 Hrs Prn 7)  Omeprazole 40 Mg Cpdr (Omeprazole) .Marland Kitchen.. 1 Once Daily At Times 8)  Singulair 10 Mg  Tabs (Montelukast Sodium) .Marland Kitchen.. 1 Once  Daily 9)  Citalopram Hydrobromide 40 Mg Tabs (Citalopram Hydrobromide) .... One By Mouth Daily 10)  Symbicort 160-4.5 Mcg/act Aero (Budesonide-Formoterol Fumarate) .... Two Puf By Mouth Two Times A Day 11)  Rhinocort Aqua 32 Mcg/act Susp (Budesonide) .... Use As Directed 12)  Chlorzoxazone 500 Mg Tabs (Chlorzoxazone) .... One By Mouth Two Times A Day Prn 13)  Astelin 137 Mcg/spray Soln (Azelastine Hcl) .... Use As Directed 14)  Cytomel 25 Mcg Tabs (Liothyronine Sodium) .Marland Kitchen.. 1 Once Daily 15)  Zyrtec Allergy 10 Mg Caps (Cetirizine Hcl) .... One By Mouth At Bed Time 16)  Budeprion Sr 150 Mg Xr12h-Tab (Bupropion Hcl) .... One By Mouth Daily 17)  Macrodantin 100 Mg Caps (Nitrofurantoin Macrocrystal) .... One By Mouth Daily 18)  Allegra 180 Mg Tabs (Fexofenadine Hcl) .Marland Kitchen.. 1 Once Daily 19)  Camila 0.35 Mg Tabs (Norethindrone) .... Use As Directed 20)  Zantac 150 Mg Tabs (Ranitidine Hcl) .... Once Daily As Needed 21)  Lactaid 3000 Unit Tabs (Lactase) .... Once Daily As Needed 22)  Claritin 10 Mg Tabs (Loratadine) .... Once Daily As Needed 23)  Imodium Multi-Symptom Relief 2-125 Mg Tabs (Loperamide-Simethicone) .... As Needed 24)  Estrace 0.1 Mg/gm Crea (Estradiol) .... Generic  1gm Intravaginally Mwf Nights 25)  Xyzal 5 Mg Tabs (Levocetirizine Dihydrochloride) .Marland Kitchen.. 1 Once Daily 26)  Freestyle Lite Test  Strp (Glucose Blood) .... Use As Directed 27)  Amitiza 24 Mcg Caps (Lubiprostone) .... One By Mouth Two Times A Day As Neede To Keep Stools Soft 28)  Tramadol Hcl 50 Mg Tabs (Tramadol Hcl) .... One By Mouth Q 4-6 Hours As Needed Pain  Allergies (verified): 1)  ! Codeine 2)  ! Vicodin 3)  ! Tetracycline  Past History:  Family History: Last updated: 03/02/2008 brother and mother Family History of CAD Female 1st degree relative <60 Family History of CAD Female 1st degree relative <50  Social History: Last updated: 09/14/2007 Retired Divorced Former Smoker Drug use-no Regular  exercise-no  Risk Factors: Exercise: no (09/14/2007)  Risk Factors: Smoking Status: quit (10/04/2010)  Past medical, surgical, family and social histories (including risk factors) reviewed, and no changes noted (except as noted below).  Past Medical History: Reviewed history from 05/26/2008 and no changes required. Allergic rhinitis Depression fibromyalgia Hypertension Hyperlipidemia Low back pain  Past Surgical History: Reviewed history from 05/26/2008 and no changes required. Denies surgical history  Family History: Reviewed history from 03/02/2008 and no changes required. brother and mother Family History of CAD Female 1st degree relative <60 Family History of CAD Female 1st degree relative <50  Social History: Reviewed history from 09/14/2007 and no changes required. Retired Divorced Former Smoker Drug use-no Regular exercise-no  Review of Systems       The patient complains of hoarseness, prolonged cough, and abdominal pain.  The patient denies anorexia, fever, weight loss, weight gain, vision loss, decreased hearing, chest pain, syncope, dyspnea on exertion, peripheral edema, headaches, hemoptysis, melena, hematochezia, severe indigestion/heartburn, hematuria, incontinence, genital sores, muscle weakness, suspicious skin lesions, transient blindness, difficulty walking, depression, unusual weight change, abnormal bleeding, enlarged lymph nodes, angioedema, and breast masses.    Physical Exam  General:  well-developed, well-nourished, and underweight appearing.   Head:  Normocephalic and atraumatic without obvious abnormalities. No apparent alopecia or balding. Eyes:  pupils equal and pupils round.   Ears:  R ear normal and L ear normal.   Nose:  no external deformity and no nasal discharge.   Neck:  No deformities, masses, or tenderness noted. Lungs:  Normal respiratory effort, chest expands symmetrically. Lungs are clear to auscultation, no crackles or  wheezes. Heart:  Normal rate and regular rhythm. S1 and S2 normal without gallop, murmur, click, rub or other extra sounds. Msk:  decreased ROM, joint tenderness, and joint swelling.   Extremities:  1+ left pedal edema and 1+ right pedal edema.   Neurologic:  alert & oriented X3.     Impression & Recommendations:  Problem # 1:  CONSTIPATION, SLOW TRANSIT (ICD-564.01) amitiza to replace the colace The following medications were removed from the medication list:    Colace 100 Mg Caps (Docusate sodium) .Marland Kitchen... 1 once daily  Discussed dietary fiber measures and increased water intake.   Problem # 2:  LOW BACK PAIN (ICD-724.2) the pts has been out of the oxycodone  The following medications were removed from the medication list:    Oxycodone-acetaminophen 5-325 Mg Tabs (Oxycodone-acetaminophen) .Marland Kitchen... 1 every 8 hours as needed Her updated medication list for this problem includes:    Tizanidine Hcl 4 Mg Tabs (Tizanidine hcl) ..... One by mouth two times a day for muscle spasm and pain    Chlorzoxazone 500 Mg Tabs (Chlorzoxazone) ..... One by mouth two times a day prn    Tramadol Hcl 50 Mg Tabs (Tramadol hcl) ..... One by mouth q 4-6 hours as needed pain  Discussed use of moist heat or ice, modified activities, medications, and stretching/strengthening exercises. Back care instructions given. To be seen in 2 weeks if no improvement; sooner if worsening of symptoms.   Problem # 3:  HYPERTENSION (ICD-401.9)  Her updated medication list for this problem includes:    Furosemide 40 Mg Tabs (Furosemide) .Marland Kitchen... 1/2 by mouth daily at t imes    Nadolol 40 Mg Tabs (Nadolol) ..... Once daily  BP today: 160/90 Prior BP: 160/80 (08/27/2010)  Prior 10 Yr Risk Heart Disease: Not enough information (11/22/2008)  Labs Reviewed: K+: 5.0 (08/27/2010) Creat: : 0.6 (08/27/2010)   Chol: 217 (09/14/2007)   HDL: 71.7 (09/14/2007)   LDL: DEL (09/14/2007)   TG: 201 (09/14/2007)  Problem # 4:  FIBROMYALGIA  (ICD-729.1) Assessment: Deteriorated  the pt has polypharmacy and multiple evaluations but has not see a rhematologist I would recomment we seen an opinion for ther pain and to either confirm or rule out the diagnosis The following medications were removed from the medication list:    Oxycodone-acetaminophen 5-325 Mg Tabs (Oxycodone-acetaminophen) .Marland Kitchen... 1 every 8 hours as needed Her updated medication list for this problem includes:    Tizanidine Hcl 4 Mg Tabs (Tizanidine hcl) .Marland KitchenMarland KitchenMarland KitchenMarland Kitchen  One by mouth two times a day for muscle spasm and pain    Chlorzoxazone 500 Mg Tabs (Chlorzoxazone) ..... One by mouth two times a day prn    Tramadol Hcl 50 Mg Tabs (Tramadol hcl) ..... One by mouth q 4-6 hours as needed pain  Orders: Rheumatology Referral (Rheumatology)  Problem # 5:  ABDOMINAL PAIN, LEFT LOWER QUADRANT (ICD-789.04)  with question of perforation CT showed improvement in dec with IBS and diverticulosis as diagnosis would not repeat now... unless WBC elevated  Her updated medication list for this problem includes:    Amitiza 24 Mcg Caps (Lubiprostone) ..... One by mouth two times a day as neede to keep stools soft  Discussed symptom control with the patient.   Orders: TLB-CBC Platelet - w/Differential (85025-CBCD) TLB-Hepatic/Liver Function Pnl (80076-HEPATIC) Venipuncture (16109) Specimen Handling (60454)  Complete Medication List: 1)  Tizanidine Hcl 4 Mg Tabs (Tizanidine hcl) .... One by mouth two times a day for muscle spasm and pain 2)  Furosemide 40 Mg Tabs (Furosemide) .... 1/2 by mouth daily at t imes 3)  Nadolol 40 Mg Tabs (Nadolol) .... Once daily 4)  Klor-con 8 Meq Tbcr (Potassium chloride) .... Once daily 5)  Ambien 10 Mg Tabs (Zolpidem tartrate) .... As needed 6)  Alprazolam 0.5 Mg Tabs (Alprazolam) .... Every 8 hrs prn 7)  Omeprazole 40 Mg Cpdr (Omeprazole) .Marland Kitchen.. 1 once daily at times 8)  Singulair 10 Mg Tabs (Montelukast sodium) .Marland Kitchen.. 1 once daily 9)  Citalopram  Hydrobromide 40 Mg Tabs (Citalopram hydrobromide) .... One by mouth daily 10)  Symbicort 160-4.5 Mcg/act Aero (Budesonide-formoterol fumarate) .... Two puf by mouth two times a day 11)  Rhinocort Aqua 32 Mcg/act Susp (Budesonide) .... Use as directed 12)  Chlorzoxazone 500 Mg Tabs (Chlorzoxazone) .... One by mouth two times a day prn 13)  Astelin 137 Mcg/spray Soln (Azelastine hcl) .... Use as directed 14)  Cytomel 25 Mcg Tabs (Liothyronine sodium) .Marland Kitchen.. 1 once daily 15)  Zyrtec Allergy 10 Mg Caps (Cetirizine hcl) .... One by mouth at bed time 16)  Budeprion Sr 150 Mg Xr12h-tab (Bupropion hcl) .... One by mouth daily 17)  Macrodantin 100 Mg Caps (Nitrofurantoin macrocrystal) .... One by mouth daily 18)  Allegra 180 Mg Tabs (Fexofenadine hcl) .Marland Kitchen.. 1 once daily 19)  Camila 0.35 Mg Tabs (Norethindrone) .... Use as directed 20)  Zantac 150 Mg Tabs (Ranitidine hcl) .... Once daily as needed 21)  Lactaid 3000 Unit Tabs (Lactase) .... Once daily as needed 22)  Claritin 10 Mg Tabs (Loratadine) .... Once daily as needed 23)  Imodium Multi-symptom Relief 2-125 Mg Tabs (Loperamide-simethicone) .... As needed 24)  Estrace 0.1 Mg/gm Crea (Estradiol) .... Generic  1gm intravaginally mwf nights 25)  Xyzal 5 Mg Tabs (Levocetirizine dihydrochloride) .Marland Kitchen.. 1 once daily 26)  Freestyle Lite Test Strp (Glucose blood) .... Use as directed 27)  Amitiza 24 Mcg Caps (Lubiprostone) .... One by mouth two times a day as neede to keep stools soft 28)  Tramadol Hcl 50 Mg Tabs (Tramadol hcl) .... One by mouth q 4-6 hours as needed pain  Other Orders: Vit B12 1000 mcg (J3420) Admin of Therapeutic Inj  intramuscular or subcutaneous (09811)  Hypertension Assessment/Plan:      The patient's hypertensive risk group is category C: Target organ damage and/or diabetes.  Today's blood pressure is 160/90.  Her blood pressure goal is < 125/75.   Patient Instructions: 1)  Please schedule a follow-up appointment in 2 months. 2)   trial  of tramadol for pain 4 times a day 3)  stay off the providgil 4)  take the amitiza Prescriptions: TRAMADOL HCL 50 MG TABS (TRAMADOL HCL) one by mouth q 4-6 hours as needed pain  #90 x 3   Entered and Authorized by:   Stacie Glaze MD   Signed by:   Stacie Glaze MD on 10/04/2010   Method used:   Electronically to        [facility] [address] [phone]* (retail)       [address]       [place], [place]  [address]       Ph: [phone] or [phone]       Fax: [phone]   RxID:   [id number] AMITIZA 24 MCG CAPS (LUBIPROSTONE) one by mouth two times a day as neede to keep stools soft  #60 x 5   Entered and Authorized by:   Stacie Glaze MD   Signed by:   Stacie Glaze MD on 10/04/2010   Method used:   Electronically to        [facility] [address] [phone]* (retail)       [address]       [place], [place]  [address]       Ph: [phone] or [phone]       Fax: [phone]   RxID:   [id number]    Medication Administration  Injection # 1:    Medication: Vit B12 1000 mcg    Diagnosis: ANEMIA, B12 DEFICIENCY (ICD-281.1)    Route: IM    Site: L deltoid    Exp Date: 10/29/2011    Lot #: [id number]    Mfr: [employer]    Patient tolerated injection without complications    Given by: Willy Eddy, LPN (October 04, 2010 5:16 PM)  Orders Added: 1)  Est. Patient Level IV [76283] 2)  TLB-CBC Platelet - w/Differential [85025-CBCD] 3)  TLB-Hepatic/Liver Function Pnl [80076-HEPATIC] 4)  Venipuncture [15176] 5)  Specimen Handling [99000] 6)  Vit B12 1000 mcg [J3420] 7)  Admin of Therapeutic Inj  intramuscular or subcutaneous [96372] 8)  Rheumatology Referral [Rheumatology]

## 2010-10-31 NOTE — Assessment & Plan Note (Signed)
Summary: 3 month fup//ccm   Vital Signs:  Patient profile:   55 year old female Height:      60 inches Weight:      172 pounds BMI:     33.71 Temp:     98.3 degrees F oral Pulse rate:   76 / minute Resp:     14 per minute BP sitting:   160 / 80  (left arm)  Vitals Entered By: Willy Eddy, LPN (August 27, 2010 3:13 PM) CC: [facility], Hypertension Management Is Patient Diabetic? No   Primary Care Provider:  Stacie Glaze MD  CC:  [facility] and Hypert[facility] Management.  History of Present Illness: she was hospitalized for microperferation of an infected diverticuli Dr Lavera Guise saw her in the hospital No abcess seen, surgery consultation and follow up CT was to be scheduled in two weeks ( CT prior) with Dr Janee Morn ( [facility]) Asthma stable HTN  moderately elevated blood pressure  Hypertension History:      She denies headache, chest pain, palpitations, dyspnea with exertion, orthopnea, PND, peripheral edema, visual symptoms, neurologic problems, syncope, and side effects from treatment.        Positive major cardiovascular risk factors include female age 93 years old or older, diabetes, hyperlipidemia, and hypertension.  Negative major cardiovascular risk factors include non-tobacco-user status.        Further assessment for target organ damage reveals no history of ASHD, stroke/TIA, or peripheral vascular disease.     Preventive Screening-Counseling & Management  Alcohol-Tobacco     Smoking Status: quit     Tobacco Counseling: to remain off tobacco products  Problems Prior to Update: 1)  Diverticulitis, Colon, With Perforation  (ICD-562.11) 2)  Abdominal Pain, Left Lower Quadrant  (ICD-789.04) 3)  Vaginitis, Atrophic, Symptomatic  (ICD-627.3) 4)  Bacterial Vaginitis  (ICD-616.10) 5)  Cystitis, Acute, Recurrent  (ICD-595.0) 6)  Acute Frontal Sinusitis  (ICD-461.1) 7)  Cystitis, Chronic Interstitial  (ICD-595.1) 8)  Unspecified Hypothyroidism   (ICD-244.9) 9)  Lipoma of Other Specified Sites  (ICD-214.8) 10)  Bruxism  (ICD-306.8) 11)  Syncope  (ICD-780.2) 12)  Mri, Brain, Abnormal  (ICD-794.09) 13)  Muscle Weakness (GENERALIZED)  (ICD-728.87) 14)  Cerumen Impaction, Bilateral  (ICD-380.4) 15)  Candidiasis of Unspecified Site  (ICD-112.9) 16)  Unspecified Allergic Alveolitis and Pneumonitis  (ICD-495.9) 17)  Hip Pain, Left, Chronic  (ICD-719.45) 18)  Malaise and Fatigue  (ICD-780.79) 19)  Irritable Bowel Syndrome  (ICD-564.1) 20)  Low Back Pain  (ICD-724.2) 21)  Chest Pain, Atypical  (ICD-786.59) 22)  Family History of Cad Female 1st Degree Relative <50  (ICD-V17.3) 23)  Family History of Cad Female 1st Degree Relative <60  (ICD-V16.49) 24)  Extrinsic Asthma, With Exacerbation  (ICD-493.02) 25)  Diabetes Mellitus, Type II, Uncontrolled  (ICD-250.02) 26)  Proteinuria  (ICD-791.0) 27)  Hyperlipidemia  (ICD-272.4) 28)  Hypercalcemia  (ICD-275.42) 29)  Insomnia With Sleep Apnea Unspecified  (ICD-780.51) 30)  Hypertension  (ICD-401.9) 31)  Anemia, B12 Deficiency  (ICD-281.1) 32)  Degenerative Disc Disease, 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.4) 33)  Depression  (ICD-311) 34)  Fibromyalgia  (ICD-729.1) 35)  Allergic Rhinitis  (ICD-477.9)  Current Problems (verified): 1)  Abdominal Pain, Left Lower Quadrant  (ICD-789.04) 2)  Vaginitis, Atrophic, Symptomatic  (ICD-627.3) 3)  Bacterial Vaginitis  (ICD-616.10) 4)  Cystitis, Acute, Recurrent  (ICD-595.0) 5)  Acute Frontal Sinusitis  (ICD-461.1) 6)  Cystitis, Chronic Interstitial  (ICD-595.1) 7)  Unspecified Hypothyroidism  (ICD-244.9) 8)  Lipoma of  Other Specified Sites  (ICD-214.8) 9)  Bruxism  (ICD-306.8) 10)  Syncope  (ICD-780.2) 11)  Mri, Brain, Abnormal  (ICD-794.09) 12)  Muscle Weakness (GENERALIZED)  (ICD-728.87) 13)  Cerumen Impaction, Bilateral  (ICD-380.4) 14)  Candidiasis of Unspecified Site  (ICD-112.9) 15)  Unspecified Allergic Alveolitis and Pneumonitis  (ICD-495.9) 16)   Hip Pain, Left, Chronic  (ICD-719.45) 17)  Malaise and Fatigue  (ICD-780.79) 18)  Irritable Bowel Syndrome  (ICD-564.1) 19)  Low Back Pain  (ICD-724.2) 20)  Chest Pain, Atypical  (ICD-786.59) 21)  Family History of Cad Female 1st Degree Relative <50  (ICD-V17.3) 22)  Family History of Cad Female 1st Degree Relative <60  (ICD-V16.49) 23)  Extrinsic Asthma, With Exacerbation  (ICD-493.02) 24)  Diabetes Mellitus, Type II, Uncontrolled  (ICD-250.02) 25)  Proteinuria  (ICD-791.0) 26)  Hyperlipidemia  (ICD-272.4) 27)  Hypercalcemia  (ICD-275.42) 28)  Insomnia With Sleep Apnea Unspecified  (ICD-780.51) 29)  Hypertension  (ICD-401.9) 30)  Anemia, B12 Deficiency  (ICD-281.1) 31)  Degenerative Disc Disease, 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.4) 32)  Depression  (ICD-311) 33)  Fibromyalgia  (ICD-729.1) 34)  Allergic Rhinitis  (ICD-477.9)  Medications Prior to Update: 1)  Tizanidine Hcl 4 Mg Tabs (Tizanidine Hcl) .... One By Mouth Two Times A Day For Muscle Spasm and Pain 2)  Furosemide 40 Mg Tabs (Furosemide) .... 1/2 By Mouth Daily At T Imes 3)  Nadolol 40 Mg Tabs (Nadolol) .... Once Daily 4)  Klor-Con 8 Meq  Tbcr (Potassium Chloride) .... Once Daily 5)  Ambien 10 Mg  Tabs (Zolpidem Tartrate) .... As Needed 6)  Alprazolam 0.5 Mg  Tabs (Alprazolam) .... Every 8 Hrs Prn 7)  Omeprazole 40 Mg Cpdr (Omeprazole) .Marland Kitchen.. 1 Once Daily At Times 8)  Singulair 10 Mg  Tabs (Montelukast Sodium) .Marland Kitchen.. 1 Once Daily 9)  Citalopram Hydrobromide 40 Mg Tabs (Citalopram Hydrobromide) .... One By Mouth Daily 10)  Symbicort 160-4.5 Mcg/act Aero (Budesonide-Formoterol Fumarate) .... Two Puf By Mouth Two Times A Day 11)  Rhinocort Aqua 32 Mcg/act Susp (Budesonide) .... Use As Directed 12)  Chlorzoxazone 500 Mg Tabs (Chlorzoxazone) .... One By Mouth Two Times A Day Prn 13)  Astelin 137 Mcg/spray Soln (Azelastine Hcl) .... Use As Directed 14)  Vitamin D 1000 Unit Caps (Cholecalciferol) .Marland Kitchen.. 1 Once Daily 15)  Cytomel 25 Mcg Tabs  (Liothyronine Sodium) .Marland Kitchen.. 1 Once Daily 16)  Nuvigil 150 Mg Tabs (Armodafinil) .... 1/2 Tab At Times 17)  Im Injections-D12- 18)  Zyrtec Allergy 10 Mg Caps (Cetirizine Hcl) .... One By Mouth At Bed Time 19)  Budeprion Sr 150 Mg Xr12h-Tab (Bupropion Hcl) .... One By Mouth Daily 20)  Macrodantin 100 Mg Caps (Nitrofurantoin Macrocrystal) .... One By Mouth Daily 21)  Allegra 180 Mg Tabs (Fexofenadine Hcl) .Marland Kitchen.. 1 Once Daily 22)  Camila 0.35 Mg Tabs (Norethindrone) .... Use As Directed 23)  Zantac 150 Mg Tabs (Ranitidine Hcl) .... Once Daily As Needed 24)  Lactaid 3000 Unit Tabs (Lactase) .... Once Daily As Needed 25)  Claritin 10 Mg Tabs (Loratadine) .... Once Daily As Needed 26)  Imodium Multi-Symptom Relief 2-125 Mg Tabs (Loperamide-Simethicone) .... As Needed 27)  Estrace 0.1 Mg/gm Crea (Estradiol) .... Generic  1gm Intravaginally Mwf Nights 28)  Xyzal 5 Mg Tabs (Levocetirizine Dihydrochloride) .Marland Kitchen.. 1 Once Daily 29)  Flagyl 500 Mg Tabs (Metronidazole) .... One Two Times A Day For 7 Days 30)  Freestyle Lite Test  Strp (Glucose Blood) .... Use As Directed  Current Medications (verified): 1)  Tizanidine Hcl 4  Mg Tabs (Tizanidine Hcl) .... One By Mouth Two Times A Day For Muscle Spasm and Pain 2)  Furosemide 40 Mg Tabs (Furosemide) .... 1/2 By Mouth Daily At T Imes 3)  Nadolol 40 Mg Tabs (Nadolol) .... Once Daily 4)  Klor-Con 8 Meq  Tbcr (Potassium Chloride) .... Once Daily 5)  Ambien 10 Mg  Tabs (Zolpidem Tartrate) .... As Needed 6)  Alprazolam 0.5 Mg  Tabs (Alprazolam) .... Every 8 Hrs Prn 7)  Omeprazole 40 Mg Cpdr (Omeprazole) .Marland Kitchen.. 1 Once Daily At Times 8)  Singulair 10 Mg  Tabs (Montelukast Sodium) .Marland Kitchen.. 1 Once Daily 9)  Citalopram Hydrobromide 40 Mg Tabs (Citalopram Hydrobromide) .... One By Mouth Daily 10)  Symbicort 160-4.5 Mcg/act Aero (Budesonide-Formoterol Fumarate) .... Two Puf By Mouth Two Times A Day 11)  Rhinocort Aqua 32 Mcg/act Susp (Budesonide) .... Use As Directed 12)   Chlorzoxazone 500 Mg Tabs (Chlorzoxazone) .... One By Mouth Two Times A Day Prn 13)  Astelin 137 Mcg/spray Soln (Azelastine Hcl) .... Use As Directed 14)  Cytomel 25 Mcg Tabs (Liothyronine Sodium) .Marland Kitchen.. 1 Once Daily 15)  Nuvigil 150 Mg Tabs (Armodafinil) .... 1/2 Tab At Times 16)  Zyrtec Allergy 10 Mg Caps (Cetirizine Hcl) .... One By Mouth At Bed Time 17)  Budeprion Sr 150 Mg Xr12h-Tab (Bupropion Hcl) .... One By Mouth Daily 18)  Macrodantin 100 Mg Caps (Nitrofurantoin Macrocrystal) .... One By Mouth Daily 19)  Allegra 180 Mg Tabs (Fexofenadine Hcl) .Marland Kitchen.. 1 Once Daily 20)  Camila 0.35 Mg Tabs (Norethindrone) .... Use As Directed 21)  Zantac 150 Mg Tabs (Ranitidine Hcl) .... Once Daily As Needed 22)  Lactaid 3000 Unit Tabs (Lactase) .... Once Daily As Needed 23)  Claritin 10 Mg Tabs (Loratadine) .... Once Daily As Needed 24)  Imodium Multi-Symptom Relief 2-125 Mg Tabs (Loperamide-Simethicone) .... As Needed 25)  Estrace 0.1 Mg/gm Crea (Estradiol) .... Generic  1gm Intravaginally Mwf Nights 26)  Xyzal 5 Mg Tabs (Levocetirizine Dihydrochloride) .Marland Kitchen.. 1 Once Daily 27)  Flagyl 500 Mg Tabs (Metronidazole) .Marland Kitchen.. 1 Three Times A Day 28)  Freestyle Lite Test  Strp (Glucose Blood) .... Use As Directed 29)  Oxycodone-Acetaminophen 5-325 Mg Tabs (Oxycodone-Acetaminophen) .Marland Kitchen.. 1 Every 8 Hours As Needed 30)  Colace 100 Mg Caps (Docusate Sodium) .Marland Kitchen.. 1 Once Daily 31)  Fluconazole 150 Mg Tabs (Fluconazole) .... One By Mouth Now and The One By Mouth On Friday  Allergies (verified): 1)  ! Codeine 2)  ! Vicodin 3)  ! Tetracycline  Past History:  Family History: Last updated: 03/02/2008 brother and mother Family History of CAD Female 1st degree relative <60 Family History of CAD Female 1st degree relative <50  Social History: Last updated: 09/14/2007 Retired Divorced Former Smoker Drug use-no Regular exercise-no  Risk Factors: Exercise: no (09/14/2007)  Risk Factors: Smoking Status: quit  (08/27/2010)  Past medical, surgical, family and social histories (including risk factors) reviewed, and no changes noted (except as noted below).  Past Medical History: Reviewed history from 05/26/2008 and no changes required. Allergic rhinitis Depression fibromyalgia Hypertension Hyperlipidemia Low back pain  Past Surgical History: Reviewed history from 05/26/2008 and no changes required. Denies surgical history  Family History: Reviewed history from 03/02/2008 and no changes required. brother and mother Family History of CAD Female 1st degree relative <60 Family History of CAD Female 1st degree relative <50  Social History: Reviewed history from 09/14/2007 and no changes required. Retired Divorced Former Smoker Drug use-no Regular exercise-no  Review of Systems  The patient  denies anorexia, fever, weight loss, weight gain, vision loss, decreased hearing, hoarseness, chest pain, syncope, dyspnea on exertion, peripheral edema, prolonged cough, headaches, hemoptysis, abdominal pain, melena, hematochezia, severe indigestion/heartburn, hematuria, incontinence, genital sores, muscle weakness, suspicious skin lesions, transient blindness, difficulty walking, depression, unusual weight change, abnormal bleeding, enlarged lymph nodes, angioedema, and breast masses.    Physical Exam  General:  Well-developed,well-nourished,in no acute distress; alert,appropriate and cooperative throughout examination Head:  Normocephalic and atraumatic without obvious abnormalities. No apparent alopecia or balding. Eyes:  pupils equal and pupils round.   Ears:  R ear normal and L ear normal.   Nose:  no external deformity and no nasal discharge.   Mouth:  Oral mucosa and oropharynx without lesions or exudates.  Teeth in good repair. Neck:  No deformities, masses, or tenderness noted. Lungs:  Normal respiratory effort, chest expands symmetrically. Lungs are clear to auscultation, no crackles or  wheezes. Heart:  Normal rate and regular rhythm. S1 and S2 normal without gallop, murmur, click, rub or other extra sounds. Abdomen:  soft, normal bowel sounds, and no masses.     Impression & Recommendations:  Problem # 1:  DIVERTICULITIS, COLON, WITH PERFORATION (ICD-562.11) Assessment Improved still on antibiotics until friday Dec 2 CT showed microperforations without abcess and was hospitalized fro IV antibiotic and observations surgical consultation recomened repeat CT in tow weeks  Labs Reviewed: Hgb: 12.0 (08/16/2010)   Hct: 35.1 (08/16/2010)   WBC: 7.7 (08/16/2010)  Orders: Venipuncture (04540) Specimen Handling (98119) TLB-CBC Platelet - w/Differential (85025-CBCD) Radiology Referral (Radiology)  Problem # 2:  CYSTITIS, ACUTE, RECURRENT (ICD-595.0) Assessment: Improved  The following medications were removed from the medication list:    Macrodantin 100 Mg Caps (Nitrofurantoin macrocrystal) .Marland Kitchen... 1 once daily continues Her updated medication list for this problem includes:    Macrodantin 100 Mg Caps (Nitrofurantoin macrocrystal) ..... One by mouth daily    Flagyl 500 Mg Tabs (Metronidazole) .Marland Kitchen... 1 three times a day  Encouraged to push clear liquids, get enough rest, and take acetaminophen as needed. To be seen in 10 days if no improvement, sooner if worse.  Problem # 3:  DIABETES MELLITUS, TYPE II, UNCONTROLLED (ICD-250.02) Assessment: Unchanged  Labs Reviewed: Creat: 0.7 (08/16/2010)    Reviewed HgBA1c results: 5.7 (11/05/2009)  5.8 (01/24/2009)  Orders: TLB-A1C / Hgb A1C (Glycohemoglobin) (83036-A1C) TLB-BMP (Basic Metabolic Panel-BMET) (80048-METABOL)  Problem # 4:  CANDIDIASIS OF UNSPECIFIED SITE (ICD-112.9)  Complete Medication List: 1)  Tizanidine Hcl 4 Mg Tabs (Tizanidine hcl) .... One by mouth two times a day for muscle spasm and pain 2)  Furosemide 40 Mg Tabs (Furosemide) .... 1/2 by mouth daily at t imes 3)  Nadolol 40 Mg Tabs (Nadolol) .... Once  daily 4)  Klor-con 8 Meq Tbcr (Potassium chloride) .... Once daily 5)  Ambien 10 Mg Tabs (Zolpidem tartrate) .... As needed 6)  Alprazolam 0.5 Mg Tabs (Alprazolam) .... Every 8 hrs prn 7)  Omeprazole 40 Mg Cpdr (Omeprazole) .Marland Kitchen.. 1 once daily at times 8)  Singulair 10 Mg Tabs (Montelukast sodium) .Marland Kitchen.. 1 once daily 9)  Citalopram Hydrobromide 40 Mg Tabs (Citalopram hydrobromide) .... One by mouth daily 10)  Symbicort 160-4.5 Mcg/act Aero (Budesonide-formoterol fumarate) .... Two puf by mouth two times a day 11)  Rhinocort Aqua 32 Mcg/act Susp (Budesonide) .... Use as directed 12)  Chlorzoxazone 500 Mg Tabs (Chlorzoxazone) .... One by mouth two times a day prn 13)  Astelin 137 Mcg/spray Soln (Azelastine hcl) .... Use as directed 14)  Cytomel  25 Mcg Tabs (Liothyronine sodium) .Marland Kitchen.. 1 once daily 15)  Nuvigil 150 Mg Tabs (Armodafinil) .... 1/2 tab at times 16)  Zyrtec Allergy 10 Mg Caps (Cetirizine hcl) .... One by mouth at bed time 17)  Budeprion Sr 150 Mg Xr12h-tab (Bupropion hcl) .... One by mouth daily 18)  Macrodantin 100 Mg Caps (Nitrofurantoin macrocrystal) .... One by mouth daily 19)  Allegra 180 Mg Tabs (Fexofenadine hcl) .Marland Kitchen.. 1 once daily 20)  Camila 0.35 Mg Tabs (Norethindrone) .... Use as directed 21)  Zantac 150 Mg Tabs (Ranitidine hcl) .... Once daily as needed 22)  Lactaid 3000 Unit Tabs (Lactase) .... Once daily as needed 23)  Claritin 10 Mg Tabs (Loratadine) .... Once daily as needed 24)  Imodium Multi-symptom Relief 2-125 Mg Tabs (Loperamide-simethicone) .... As needed 25)  Estrace 0.1 Mg/gm Crea (Estradiol) .... Generic  1gm intravaginally mwf nights 26)  Xyzal 5 Mg Tabs (Levocetirizine dihydrochloride) .Marland Kitchen.. 1 once daily 27)  Flagyl 500 Mg Tabs (Metronidazole) .Marland Kitchen.. 1 three times a day 28)  Freestyle Lite Test Strp (Glucose blood) .... Use as directed 29)  Oxycodone-acetaminophen 5-325 Mg Tabs (Oxycodone-acetaminophen) .Marland Kitchen.. 1 every 8 hours as needed 30)  Colace 100 Mg Caps  (Docusate sodium) .Marland Kitchen.. 1 once daily 31)  Fluconazole 150 Mg Tabs (Fluconazole) .... One by mouth now and the one by mouth on friday  Hypertension Assessment/Plan:      The patient's hypertensive risk group is category C: Target organ damage and/or diabetes.  Today's blood pressure is 160/80.  Her blood pressure goal is < 125/75.  Patient Instructions: 1)  Please schedule a follow-up appointment in 1 month. Prescriptions: FLUCONAZOLE 150 MG TABS (FLUCONAZOLE) one by mouth now and the one by mouth on friday  #2 x 0   Entered and Authorized by:   Stacie Glaze MD   Signed by:   Stacie Glaze MD on 08/27/2010   Method used:   Electronically to        [facility]  [address] [phone]* (retail)       [address]       [place], [place]  [address]       Ph: [phone] or [phone]       Fax: [phone]   RxID:   [id number] MACRODANTIN 100 MG CAPS (NITROFURANTOIN MACROCRYSTAL) one by mouth daily  #30 x 11   Entered and Authorized by:   Stacie Glaze MD   Signed by:   Stacie Glaze MD on 08/27/2010   Method used:   Electronically to        [facility] [address] [phone]* (retail)       [address]       [place], [place]  [address]       Ph: [phone] or [phone]       Fax: [phone]   RxID:   [id number]    Orders Added: 1)  Venipuncture [42706] 2)  Specimen Handling [99000] 3)  TLB-A1C / Hgb A1C (Glycohemoglobin) [83036-A1C] 4)  TLB-BMP (Basic Metabolic Panel-BMET) [80048-METABOL] 5)  TLB-CBC Platelet - w/Differential [85025-CBCD] 6)  Radiology Referral [Radiology] 7)  Est. Patient Level IV [23762]

## 2010-10-31 NOTE — Medication Information (Signed)
Summary: PA and Approval for Amitiza  PA and Approval for Amitiza   Imported By: Maryln Gottron 10/15/2010 15:18:27  _____________________________________________________________________  External Attachment:    Type:   Image     Comment:   External Document

## 2010-11-01 NOTE — Miscellaneous (Signed)
Summary: Renewal for PT Services/[place]  Renewal for PT Services/[place]   Imported By: Maryln Gottron 12/12/2009 10:22:42  _____________________________________________________________________  External Attachment:    Type:   Image     Comment:   External Document

## 2010-11-01 NOTE — Letter (Signed)
Summary: [facility]  [facility]   Imported By: Sherian Rein 06/05/2010 13:27:38  _____________________________________________________________________  External Attachment:    Type:   Image     Comment:   External Document

## 2010-11-01 NOTE — Op Note (Signed)
Summary: [facility]  [facility]   Imported By: Sherian Rein 06/05/2010 13:26:36  _____________________________________________________________________  External Attachment:    Type:   Image     Comment:   External Document

## 2010-11-08 ENCOUNTER — Inpatient Hospital Stay (INDEPENDENT_AMBULATORY_CARE_PROVIDER_SITE_OTHER)
Admission: RE | Admit: 2010-11-08 | Discharge: 2010-11-08 | Disposition: A | Discharge status: Home / Self Care | Payer: 59 | Source: Ambulatory Visit | Attending: Family Medicine | Admitting: Family Medicine

## 2010-11-08 ENCOUNTER — Emergency Department (HOSPITAL_COMMUNITY): Payer: 59

## 2010-11-08 ENCOUNTER — Telehealth: Payer: Self-pay | Admitting: *Deleted

## 2010-11-08 ENCOUNTER — Emergency Department (HOSPITAL_COMMUNITY)
Admission: EM | Admit: 2010-11-08 | Discharge: 2010-11-09 | Disposition: A | Discharge status: Home / Self Care | Payer: 59 | Attending: Emergency Medicine | Admitting: Emergency Medicine

## 2010-11-08 DIAGNOSIS — Z79899 Other long term (current) drug therapy: Secondary | ICD-10-CM | POA: Insufficient documentation

## 2010-11-08 DIAGNOSIS — R5382 Chronic fatigue, unspecified: Secondary | ICD-10-CM | POA: Insufficient documentation

## 2010-11-08 DIAGNOSIS — R141 Gas pain: Secondary | ICD-10-CM | POA: Insufficient documentation

## 2010-11-08 DIAGNOSIS — I10 Essential (primary) hypertension: Secondary | ICD-10-CM | POA: Insufficient documentation

## 2010-11-08 DIAGNOSIS — R142 Eructation: Secondary | ICD-10-CM | POA: Insufficient documentation

## 2010-11-08 DIAGNOSIS — H5710 Ocular pain, unspecified eye: Secondary | ICD-10-CM | POA: Insufficient documentation

## 2010-11-08 DIAGNOSIS — G9332 Myalgic encephalomyelitis/chronic fatigue syndrome: Secondary | ICD-10-CM | POA: Insufficient documentation

## 2010-11-08 DIAGNOSIS — R1032 Left lower quadrant pain: Secondary | ICD-10-CM

## 2010-11-08 DIAGNOSIS — H53149 Visual discomfort, unspecified: Secondary | ICD-10-CM | POA: Insufficient documentation

## 2010-11-08 DIAGNOSIS — M1990 Unspecified osteoarthritis, unspecified site: Secondary | ICD-10-CM | POA: Insufficient documentation

## 2010-11-08 DIAGNOSIS — E119 Type 2 diabetes mellitus without complications: Secondary | ICD-10-CM | POA: Insufficient documentation

## 2010-11-08 DIAGNOSIS — IMO0001 Reserved for inherently not codable concepts without codable children: Secondary | ICD-10-CM | POA: Insufficient documentation

## 2010-11-08 DIAGNOSIS — R51 Headache: Secondary | ICD-10-CM | POA: Insufficient documentation

## 2010-11-08 DIAGNOSIS — R109 Unspecified abdominal pain: Secondary | ICD-10-CM | POA: Insufficient documentation

## 2010-11-08 DIAGNOSIS — R110 Nausea: Secondary | ICD-10-CM | POA: Insufficient documentation

## 2010-11-08 DIAGNOSIS — K5712 Diverticulitis of small intestine without perforation or abscess without bleeding: Secondary | ICD-10-CM

## 2010-11-08 DIAGNOSIS — K5732 Diverticulitis of large intestine without perforation or abscess without bleeding: Secondary | ICD-10-CM

## 2010-11-08 LAB — COMPREHENSIVE METABOLIC PANEL
ALT: 22 U/L (ref 0–35)
AST: 22 U/L (ref 0–37)
Albumin: 4.3 g/dL (ref 3.5–5.2)
Calcium: 9.9 mg/dL (ref 8.4–10.5)
Chloride: 101 mEq/L (ref 96–112)
Creatinine, Ser: 0.63 mg/dL (ref 0.4–1.2)
GFR calc Af Amer: >60 mL/min (ref >60)
Sodium: 138 mEq/L (ref 135–145)

## 2010-11-08 LAB — URINALYSIS, ROUTINE W REFLEX MICROSCOPIC
Hgb urine dipstick: NEGATIVE
Specific Gravity, Urine: 1.005 (ref 1.005–1.030)
Urine Glucose, Fasting: NEGATIVE mg/dL

## 2010-11-08 LAB — URINE MICROSCOPIC-ADD ON

## 2010-11-08 LAB — CBC
MCH: 29.4 pg (ref 26.0–34.0)
Platelets: 319 10*3/uL (ref 150–400)
RBC: 4.25 MIL/uL (ref 3.87–5.11)

## 2010-11-08 LAB — DIFFERENTIAL
Basophils Absolute: 0 10*3/uL (ref 0.0–0.1)
Eosinophils Relative: 1 % (ref 0–5)
Lymphocytes Relative: 30 % (ref 12–46)
Lymphs Abs: 2.3 10*3/uL (ref 0.7–4.0)
Neutro Abs: 4.7 10*3/uL (ref 1.7–7.7)
Neutrophils Relative %: 63 % (ref 43–77)

## 2010-11-08 LAB — GLUCOSE, CAPILLARY: Glucose-Capillary: 75 mg/dL (ref 70–99)

## 2010-11-08 MED ORDER — CIPROFLOXACIN HCL 500 MG PO TABS: 500.0000 mg | ORAL_TABLET | Freq: Two times a day (BID) | ORAL | Status: AC

## 2010-11-08 MED ORDER — METRONIDAZOLE 250 MG PO TABS: 250.0000 mg | ORAL_TABLET | Freq: Three times a day (TID) | ORAL | Status: AC

## 2010-11-08 NOTE — Telephone Encounter (Signed)
Pt was in the hospital in November with Diverticulitis.  She is now having symptoms of swollen abdomen, diarrhea,  No fever or nausea.  Taking Amitiza every night .  Most BMs are watery and diarrhea.

## 2010-11-08 NOTE — Telephone Encounter (Signed)
Pt notified to pick up meds

## 2010-11-08 NOTE — Telephone Encounter (Signed)
:   Cipro 500 twice a day for 7 days and Flagyl 250 4 times a day for 7 days if the symptoms worsen she should present over the weekend that the emergency room.   Meds have been sent to the pharmacy of record

## 2010-11-29 ENCOUNTER — Encounter: Payer: Self-pay | Admitting: Internal Medicine

## 2010-12-02 ENCOUNTER — Ambulatory Visit: Payer: 59 | Admitting: Internal Medicine

## 2010-12-02 ENCOUNTER — Encounter: Payer: Self-pay | Admitting: Internal Medicine

## 2010-12-02 VITALS — BP 122/74 | HR 64 | Temp 98.5°F | Wt 169.0 lb

## 2010-12-02 DIAGNOSIS — M6281 Muscle weakness (generalized): Secondary | ICD-10-CM

## 2010-12-02 DIAGNOSIS — F339 Major depressive disorder, recurrent, unspecified: Secondary | ICD-10-CM

## 2010-12-02 DIAGNOSIS — E538 Deficiency of other specified B group vitamins: Secondary | ICD-10-CM

## 2010-12-02 DIAGNOSIS — D518 Other vitamin B12 deficiency anemias: Secondary | ICD-10-CM

## 2010-12-02 HISTORY — DX: Major depressive disorder, recurrent, unspecified: F33.9

## 2010-12-02 MED ORDER — VILAZODONE HCL 40 MG PO TABS: 1.0000 | ORAL_TABLET | Freq: Every day | ORAL | Status: DC

## 2010-12-02 MED ORDER — CYANOCOBALAMIN 1000 MCG/ML IJ SOLN
1000.0000 ug | INTRAMUSCULAR | Status: DC
  Administered 2010-12-02: 1000 ug via INTRAMUSCULAR

## 2010-12-02 NOTE — Assessment & Plan Note (Signed)
Long standing resistant depression Vibryd trial   Stop the citalopram, go to every other day on the welbutrin for one week then off tritrate to 40 over three weeks

## 2010-12-02 NOTE — Patient Instructions (Addendum)
Stop the citalopram today start the viibryd  10 mg daily in the morning for one week during the first week you may take the Wellbutrin every other day but after the first week stop the Wellbutrin and go to the 20 mg of viibryd.  Stop nuvigil.  after the third week increase the dose to 40 mg we will see you back

## 2010-12-02 NOTE — Progress Notes (Signed)
  Subjective:    Patient ID: Kristin Pope, female    DOB: 1956/09/25, 55 y.o.   MRN: [id number]  HPI In creased fibromyalgia pain. Having severe cramps in the night and was seen in the ER and found to have low potassium. Increased pain in fronts of the legs. Feels very depressed  But is not suicidal.     Review of Systems     Objective:   Physical Exam        Assessment & Plan:

## 2010-12-03 LAB — BASIC METABOLIC PANEL
Calcium: 9.9 mg/dL (ref 8.4–10.5)
GFR: 96.96 mL/min (ref >60.00)
Sodium: 138 mEq/L (ref 135–145)

## 2010-12-10 LAB — CBC
HCT: 28.8 % — ABNORMAL LOW (ref 36.0–46.0)
HCT: 30.9 % — ABNORMAL LOW (ref 36.0–46.0)
HCT: 32.8 % — ABNORMAL LOW (ref 36.0–46.0)
HCT: 33.4 % — ABNORMAL LOW (ref 36.0–46.0)
Hemoglobin: 10.1 g/dL — ABNORMAL LOW (ref 12.0–15.0)
Hemoglobin: 9.4 g/dL — ABNORMAL LOW (ref 12.0–15.0)
MCH: 30.1 pg (ref 26.0–34.0)
MCH: 30.8 pg (ref 26.0–34.0)
MCH: 31.2 pg (ref 26.0–34.0)
MCHC: 32.6 g/dL (ref 30.0–36.0)
MCHC: 32.7 g/dL (ref 30.0–36.0)
MCHC: 33.2 g/dL (ref 30.0–36.0)
MCV: 95.4 fL (ref 78.0–100.0)
MCV: 96.2 fL (ref 78.0–100.0)
MCV: 96.2 fL (ref 78.0–100.0)
Platelets: 412 10*3/uL — ABNORMAL HIGH (ref 150–400)
Platelets: 413 10*3/uL — ABNORMAL HIGH (ref 150–400)
Platelets: 414 10*3/uL — ABNORMAL HIGH (ref 150–400)
Platelets: 453 10*3/uL — ABNORMAL HIGH (ref 150–400)
RBC: 3.17 MIL/uL — ABNORMAL LOW (ref 3.87–5.11)
RBC: 3.19 MIL/uL — ABNORMAL LOW (ref 3.87–5.11)
RBC: 3.24 MIL/uL — ABNORMAL LOW (ref 3.87–5.11)
RBC: 3.41 MIL/uL — ABNORMAL LOW (ref 3.87–5.11)
RDW: 13 % (ref 11.5–15.5)
RDW: 13.1 % (ref 11.5–15.5)
RDW: 13.3 % (ref 11.5–15.5)
WBC: 7.3 10*3/uL (ref 4.0–10.5)
WBC: 7.7 10*3/uL (ref 4.0–10.5)
WBC: 7.9 10*3/uL (ref 4.0–10.5)
WBC: 7.9 10*3/uL (ref 4.0–10.5)

## 2010-12-10 LAB — BASIC METABOLIC PANEL
BUN: <1 mg/dL — ABNORMAL LOW (ref 6–23)
BUN: <1 mg/dL — ABNORMAL LOW (ref 6–23)
CO2: 26 mEq/L (ref 19–32)
CO2: 28 mEq/L (ref 19–32)
CO2: 30 mEq/L (ref 19–32)
Calcium: 8.7 mg/dL (ref 8.4–10.5)
Calcium: 8.8 mg/dL (ref 8.4–10.5)
Calcium: 8.8 mg/dL (ref 8.4–10.5)
Chloride: 105 mEq/L (ref 96–112)
Chloride: 108 mEq/L (ref 96–112)
Creatinine, Ser: 0.67 mg/dL (ref 0.4–1.2)
Creatinine, Ser: 0.68 mg/dL (ref 0.4–1.2)
GFR calc Af Amer: >60 mL/min (ref >60)
GFR calc Af Amer: >60 mL/min (ref >60)
GFR calc Af Amer: >60 mL/min (ref >60)
GFR calc non Af Amer: >60 mL/min (ref >60)
GFR calc non Af Amer: >60 mL/min (ref >60)
Glucose, Bld: 131 mg/dL — ABNORMAL HIGH (ref 70–99)
Glucose, Bld: 92 mg/dL (ref 70–99)
Potassium: 2.7 mEq/L — CL (ref 3.5–5.1)
Potassium: 3.5 mEq/L (ref 3.5–5.1)
Potassium: 3.7 mEq/L (ref 3.5–5.1)
Sodium: 139 mEq/L (ref 135–145)
Sodium: 140 mEq/L (ref 135–145)
Sodium: 141 mEq/L (ref 135–145)
Sodium: 142 mEq/L (ref 135–145)
Sodium: 142 mEq/L (ref 135–145)

## 2010-12-10 LAB — GLUCOSE, CAPILLARY
Glucose-Capillary: 101 mg/dL — ABNORMAL HIGH (ref 70–99)
Glucose-Capillary: 108 mg/dL — ABNORMAL HIGH (ref 70–99)
Glucose-Capillary: 113 mg/dL — ABNORMAL HIGH (ref 70–99)
Glucose-Capillary: 122 mg/dL — ABNORMAL HIGH (ref 70–99)
Glucose-Capillary: 127 mg/dL — ABNORMAL HIGH (ref 70–99)
Glucose-Capillary: 127 mg/dL — ABNORMAL HIGH (ref 70–99)
Glucose-Capillary: 129 mg/dL — ABNORMAL HIGH (ref 70–99)
Glucose-Capillary: 146 mg/dL — ABNORMAL HIGH (ref 70–99)
Glucose-Capillary: 69 mg/dL — ABNORMAL LOW (ref 70–99)
Glucose-Capillary: 75 mg/dL (ref 70–99)
Glucose-Capillary: 79 mg/dL (ref 70–99)
Glucose-Capillary: 84 mg/dL (ref 70–99)
Glucose-Capillary: 85 mg/dL (ref 70–99)
Glucose-Capillary: 94 mg/dL (ref 70–99)

## 2010-12-10 LAB — URINALYSIS, ROUTINE W REFLEX MICROSCOPIC
Bilirubin Urine: NEGATIVE
Glucose, UA: NEGATIVE mg/dL
Hgb urine dipstick: NEGATIVE
Ketones, ur: 15 mg/dL — AB
Leukocytes, UA: NEGATIVE
Nitrite: NEGATIVE
Protein, ur: NEGATIVE mg/dL
Specific Gravity, Urine: 1.016 (ref 1.005–1.030)
Urobilinogen, UA: 1 mg/dL (ref 0.0–1.0)
pH: 7 (ref 5.0–8.0)

## 2010-12-10 LAB — TYPE AND SCREEN: Antibody Screen: NEGATIVE

## 2010-12-10 LAB — ABO/RH: ABO/RH(D): A NEG

## 2010-12-10 LAB — URINE MICROSCOPIC-ADD ON

## 2011-01-08 ENCOUNTER — Ambulatory Visit: Payer: 59 | Admitting: Internal Medicine

## 2011-01-10 ENCOUNTER — Ambulatory Visit: Payer: 59 | Admitting: Internal Medicine

## 2011-01-20 ENCOUNTER — Telehealth: Payer: Self-pay | Admitting: *Deleted

## 2011-01-20 NOTE — Telephone Encounter (Signed)
Next appt is not until the 83 th of May, and wants to take a small portion of a pill of Nuvigil. She is fatigued and having difficulty functioning.  Has GYN surgery coming up next week.  She is off the Buproprion, Citalapram and Nuvigil now.

## 2011-01-21 ENCOUNTER — Ambulatory Visit (HOSPITAL_BASED_OUTPATIENT_CLINIC_OR_DEPARTMENT_OTHER): Payer: 59 | Admitting: Licensed Clinical Social Worker

## 2011-01-21 DIAGNOSIS — F332 Major depressive disorder, recurrent severe without psychotic features: Secondary | ICD-10-CM

## 2011-01-21 NOTE — Telephone Encounter (Signed)
Per dr Turner Daniels does not want her to take that before surgery and pt informed

## 2011-01-24 ENCOUNTER — Encounter (HOSPITAL_COMMUNITY)
Admission: RE | Admit: 2011-01-24 | Discharge: 2011-01-24 | Disposition: A | Discharge status: Home / Self Care | Payer: 59 | Source: Ambulatory Visit | Attending: Obstetrics & Gynecology | Admitting: Obstetrics & Gynecology

## 2011-01-24 LAB — CBC
Platelets: 290 10*3/uL (ref 150–400)
RDW: 13.8 % (ref 11.5–15.5)
WBC: 7.2 10*3/uL (ref 4.0–10.5)

## 2011-01-24 LAB — COMPREHENSIVE METABOLIC PANEL
ALT: 14 U/L (ref 0–35)
Albumin: 4 g/dL (ref 3.5–5.2)
Alkaline Phosphatase: 76 U/L (ref 39–117)
Potassium: 3.8 mEq/L (ref 3.5–5.1)
Sodium: 139 mEq/L (ref 135–145)
Total Protein: 6.9 g/dL (ref 6.0–8.3)

## 2011-01-27 ENCOUNTER — Telehealth: Payer: Self-pay | Admitting: *Deleted

## 2011-01-27 MED ORDER — AZITHROMYCIN 250 MG PO TABS: 250.0000 mg | ORAL_TABLET | Freq: Every day | ORAL | Status: AC

## 2011-01-27 NOTE — Telephone Encounter (Signed)
Pt. Notified.

## 2011-01-27 NOTE — Telephone Encounter (Signed)
Pt. Is having hysteroscopy, D &C, and removal of polyps Wed.  Has developed a sinus infection and the GYN MD advised she call Dr. Lovell Sheehan and get an antibiotic.

## 2011-01-27 NOTE — Telephone Encounter (Signed)
Per dr jenkins-may have z pack 

## 2011-01-31 ENCOUNTER — Ambulatory Visit (INDEPENDENT_AMBULATORY_CARE_PROVIDER_SITE_OTHER): Payer: 59 | Admitting: Internal Medicine

## 2011-01-31 DIAGNOSIS — E538 Deficiency of other specified B group vitamins: Secondary | ICD-10-CM

## 2011-01-31 MED ORDER — CYANOCOBALAMIN 1000 MCG/ML IJ SOLN
1000.0000 ug | Freq: Once | INTRAMUSCULAR | Status: AC
  Administered 2011-01-31: 1000 ug via INTRAMUSCULAR

## 2011-02-03 ENCOUNTER — Ambulatory Visit (HOSPITAL_COMMUNITY)
Admission: RE | Admit: 2011-02-03 | Discharge: 2011-02-03 | Disposition: A | Discharge status: Home / Self Care | Payer: 59 | Source: Ambulatory Visit | Attending: Obstetrics & Gynecology | Admitting: Obstetrics & Gynecology

## 2011-02-03 ENCOUNTER — Other Ambulatory Visit: Payer: Self-pay | Admitting: Obstetrics & Gynecology

## 2011-02-03 DIAGNOSIS — N950 Postmenopausal bleeding: Secondary | ICD-10-CM | POA: Insufficient documentation

## 2011-02-03 DIAGNOSIS — Z01812 Encounter for preprocedural laboratory examination: Secondary | ICD-10-CM | POA: Insufficient documentation

## 2011-02-03 DIAGNOSIS — N840 Polyp of corpus uteri: Secondary | ICD-10-CM | POA: Insufficient documentation

## 2011-02-03 DIAGNOSIS — Z01818 Encounter for other preprocedural examination: Secondary | ICD-10-CM | POA: Insufficient documentation

## 2011-02-03 LAB — URINALYSIS, DIPSTICK ONLY
Bilirubin Urine: NEGATIVE
Glucose, UA: NEGATIVE mg/dL
Ketones, ur: NEGATIVE mg/dL
Leukocytes, UA: NEGATIVE
pH: 5.5 (ref 5.0–8.0)

## 2011-02-07 ENCOUNTER — Encounter (HOSPITAL_COMMUNITY): Payer: 59 | Admitting: Licensed Clinical Social Worker

## 2011-02-11 NOTE — Procedures (Signed)
NAMESINCLAIRE, Kristin Pope                ACCOUNT NO.:  [id number]   MEDICAL RECORD NO.:  [id number]          PATIENT TYPE:  OUT   LOCATION:  SLEEP CENTER                 FACILITY[facility]  [facility]   PHYSICIAN:  Barbaraann Share, MD,FCCPDATE OF BIRTH:  04/09[birth year]1955   DATE OF STUDY:  02/23/2008                            NOCTURNAL POLYSOMNOGRAM   REFERRING PHYSICIAN:  Stacie Glaze, MD   INDICATION FOR THE STUDY:  Hypersomnia with sleep apnea.   EPWORTH SCORE:  13.   SLEEP ARCHITECTURE:  Patient had a total sleep time of 171 minutes with  no slow-wave sleep or REM noted.  Sleep onset latency was mildly  prolonged at 31 minutes, and sleep efficiency was very poor at 47%.   RESPIRATORY DATA:  The patient was found to have 3 apneas and 35  hypopneas for an apnea-hypopnea index of 13 events per hour.  She was  also noted to have 16 respiratory effort-related arousals, giving her a  respiratory disturbance index of 19 events per hour.  The events were  positional and there was moderate snoring noted throughout.   OXYGEN DATA:  There was O2 desaturation as low as 86% with the patient's  obstructive events.   CARDIAC DATA:  No clinically significant arrhythmias were noted.   MOVEMENT-PARASOMNIA:  Patient was found to have 20 leg jerks but none  with arousal or awakening.  No abnormal behaviors were noted.   IMPRESSION/RECOMMENDATION:  Mild obstructive sleep apnea/hypopnea  syndrome with an apnea-hypopnea index of 13 events/hour, and a  respiratory disturbance index of 19 events per hour.  There was O2  desaturation as low as 86%.  It should be noted the patient never  achieved slow-wave sleep or REM, and therefore her degree of sleep apnea  may be underestimated.  Treatment for this degree of sleep apnea can  include weight loss alone if applicable, upper airway surgery, oral  appliance, and also CPAP.  Clinical correlation is suggested.      Barbaraann Share, MD,FCCP  Diplomate, American Board of  Sleep  Medicine  Electronically Signed    [facility]  D:  03/09/2008 09:01:42  T:  03/09/2008 09:58:47  Job:  [id number]

## 2011-02-12 ENCOUNTER — Other Ambulatory Visit: Payer: Self-pay | Admitting: Internal Medicine

## 2011-02-12 ENCOUNTER — Ambulatory Visit (INDEPENDENT_AMBULATORY_CARE_PROVIDER_SITE_OTHER): Payer: 59 | Admitting: Internal Medicine

## 2011-02-12 ENCOUNTER — Encounter: Payer: Self-pay | Admitting: Internal Medicine

## 2011-02-12 DIAGNOSIS — K5901 Slow transit constipation: Secondary | ICD-10-CM

## 2011-02-12 DIAGNOSIS — G4730 Sleep apnea, unspecified: Secondary | ICD-10-CM

## 2011-02-12 DIAGNOSIS — N3010 Interstitial cystitis (chronic) without hematuria: Secondary | ICD-10-CM

## 2011-02-12 DIAGNOSIS — IMO0001 Reserved for inherently not codable concepts without codable children: Secondary | ICD-10-CM

## 2011-02-12 DIAGNOSIS — F339 Major depressive disorder, recurrent, unspecified: Secondary | ICD-10-CM

## 2011-02-12 MED ORDER — POTASSIUM CHLORIDE ER 8 MEQ PO TBCR: 8.0000 meq | EXTENDED_RELEASE_TABLET | Freq: Every day | ORAL | Status: DC

## 2011-02-12 MED ORDER — NADOLOL 40 MG PO TABS: 40.0000 mg | ORAL_TABLET | Freq: Every day | ORAL | Status: DC

## 2011-02-12 MED ORDER — CHLORZOXAZONE 500 MG PO TABS: 500.0000 mg | ORAL_TABLET | Freq: Two times a day (BID) | ORAL | Status: DC | PRN

## 2011-02-12 MED ORDER — OMEPRAZOLE 20 MG PO CPDR: 20.0000 mg | DELAYED_RELEASE_CAPSULE | Freq: Every day | ORAL | Status: DC

## 2011-02-12 MED ORDER — ZOLPIDEM TARTRATE 10 MG PO TABS: 10.0000 mg | ORAL_TABLET | Freq: Every evening | ORAL | Status: DC | PRN

## 2011-02-12 MED ORDER — ALPRAZOLAM 0.5 MG PO TABS: 0.5000 mg | ORAL_TABLET | Freq: Three times a day (TID) | ORAL | Status: DC | PRN

## 2011-02-12 MED ORDER — TIZANIDINE HCL 4 MG PO TABS: 4.0000 mg | ORAL_TABLET | Freq: Two times a day (BID) | ORAL | Status: DC | PRN

## 2011-02-12 MED ORDER — VILAZODONE HCL 40 MG PO TABS: 1.0000 | ORAL_TABLET | Freq: Two times a day (BID) | ORAL | Status: DC

## 2011-02-12 MED ORDER — NITROFURANTOIN MACROCRYSTAL 100 MG PO CAPS: 100.0000 mg | ORAL_CAPSULE | Freq: Every day | ORAL | Status: DC

## 2011-02-12 NOTE — Progress Notes (Signed)
  Subjective:    Patient ID: Kristin Pope, female    DOB: 08-30-1956, 55 y.o.   MRN: [id number]  HPI The pt has been on Viibryd with good mood elevation with less energy and mild persistent anxiety She has a history of fibromyalgia with chronic musculoskeletal pain.  She has a history of asthma with possible hypersensitivity reaction to fungus.  She has seen Dr. Alessandra Bevels  in the past for allergy testing, fungal antibodies and multiple homeopathic assessments She has healed a sleep study in the past due to inability to fall asleep possibly due to all of her medications but she certainly meets risk criteria for sleep apnea and obstructive to refer her to Dr. Carney Harder for evaluate   Review of Systems  Constitutional: Positive for activity change, appetite change and fatigue.  HENT: Positive for ear pain, congestion, rhinorrhea and postnasal drip.   Eyes: Negative.   Respiratory: Positive for shortness of breath. Negative for wheezing and stridor.   Cardiovascular: Negative.   Gastrointestinal: Negative.   Genitourinary: Positive for frequency and menstrual problem.  Musculoskeletal: Positive for myalgias, back pain and joint swelling.  Neurological: Positive for weakness.   Past Medical History  Diagnosis Date  . Allergy   . Depression   . Fibromyalgia   . Hypertension   . Hyperlipidemia   . Low back pain    No past surgical history on file.  reports that she quit smoking about 26 years ago. She does not have any smokeless tobacco history on file. She reports that she does not drink alcohol or use illicit drugs. family history includes Brain cancer in her paternal grandfather; Breast cancer in her maternal aunt, maternal grandmother, and sister; Coronary artery disease in an unspecified family member; Heart attack in her mother; Heart disease in her brother and mother; Lung cancer in her sister; and Stroke in her maternal grandfather. Allergies  Allergen Reactions  . Codeine       REACTION: Insomnia  . Hydrocodone-Acetaminophen     REACTION: Nausea Vomiting Fluid retention  . Tetracycline     REACTION: Nausea Vomiting       Objective:   Physical Exam    Blood pressure 130/74, pulse 76, temperature 98.2 F (36.8 C), resp. rate 16, height 4\' 11"  (1.499 m), weight 169 lb (76.658 kg). Patient is an obese white female in no apparent distress blood pressure stable HEENT shows arcus senilis but pupils equal round reactive to light and accommodation neck is supple lung fields are clear to auscultation and percussion with slight end expiratory wheezing that could be upper respiratory and etiology.  Heart examination showed regular rate with a 1/6 systolic murmur abdomen was soft protuberant with tenderness in midepigastrium.  Extremities examination of the trace edema neurological examination she was alert oriented x3 equal grips    Assessment & Plan:  Increase the viibryd to 40 BID for severe depression and fibromyalgia monitor in 23 months.  She can eliminate as many medications as possible I recommend that she stay off hormone therapy at this time we'll refer to Dr. Marcelyn Bruins for sleep apnea or

## 2011-02-14 NOTE — H&P (Signed)
Kristin Pope, Kristin Pope                ACCOUNT NO.:  [id number]   MEDICAL RECORD NO.:  [id number]          PATIENT TYPE:  INP   LOCATION:  1843                         FACILITY:  [facility]   PHYSICIAN:  Corwin Levins, M.D. LHCDATE OF BIRTH:  September 18, 1956   DATE OF ADMISSION:  10/09/2005  DATE OF DISCHARGE:                                HISTORY & PHYSICAL   CHIEF COMPLAINT:  Fever and urinary tract infection symptoms for the last  several days.   HISTORY OF PRESENT ILLNESS:  Kristin Pope is a 55 year old white female with  increasing urinary symptoms for the last week, starting very minor, but then  becoming much more difficult over the last several days and finally at about  5:30 p.m. on October 09, 2005, had marked difficulty with hesitancy, urinary  color change, diffuse abdominal pain, fever, chills, flank pain, nausea and  vomiting.  There is also increasing abdominal distention and bloating, to  where she feels like she is constipated, but glycerine p.o. is no help.  She  was brought by EMS to the emergency room where the initial evaluation shows  an abnormal urinalysis including elevated white blood cells.  She is  therefore for admission for probable pyelonephritis treatment, as well as  other evaluations.   PAST MEDICAL HISTORY:  1.  History of C-spine DDD/spondylosis.  2.  Hypercholesterolemia.  3.  Chronic fatigue with fibromyalgia.  4.  Hypertension.  5.  Obesity.   PAST SURGICAL HISTORY:  None.   ALLERGIES:  IBUPROFEN, CODEINE, TETRACYCLINE.   CURRENT MEDICATIONS:  1.  Bupropion ER 20 mg b.i.d.  2.  Ambien 10 mg q.h.s.  3.  Lexapro 10 mg p.o. daily.  4.  Micardis 40 mg p.o. daily.  5.  Xanax 0.5 mg t.i.d. p.r.n.  6.  Flexeril 10 mg q.h.s.  7.  Cymbalta 60 mg p.o. daily.  8.  Camilla 0.35, one p.o. daily.  9.  Corzide 40/5 mg, one p.o. daily.  10. Crestor unclear dose, one daily.  11. Parafon Forte 500 mg t.i.d.   SOCIAL HISTORY:  Alcohol:  Occasional.  Tobacco:   None.  Lives alone.   FAMILY HISTORY:  Heart disease and breast cancer.   REVIEW OF SYSTEMS:  Otherwise noncontributory except that she feels that she  is constipated as above.  She also mentions some increased uterine thickness  in October 2006, per GYN.  Told to follow up after her next menstrual  period, but has not had menses since that time.   PHYSICAL EXAMINATION:  GENERAL:  She is mildly ill-appearing.  VITAL SIGNS:  Temperature 102 degrees, heart rate 92, respirations 18, blood  pressure 117/60, O2 saturation 98%.  HEENT:  Sclerae clear.  Tympanic membranes clear.  Pharynx benign.  NECK:  No lymphadenopathy, jugular venous distention or thyromegaly.  CHEST:  No rales or rhonchi.  CARDIOVASCULAR:  A regular rate and rhythm.  ABDOMEN:  Soft, diffusely mildly tender.  Positive bilateral flank  tenderness.  No organomegaly, no masses.  EXTREMITIES:  No edema.   LABORATORY DATA:  Urinalysis was too numerous to count white blood  cells,  many bacteria, positive leukocyte esterase and proteinuria.  Hemoglobin  11.6, white blood cell count 16.3 with a left shift.  Sodium 120, potassium  3.1, chloride 87, bicarbonate 25, BUN 26, creatinine 2.5, glucose 114.   ASSESSMENT/PLAN:  1.  Acute pyelonephritis:  Admit for blood and urine cultures, IV Cipro, and      follow clinically.  2.  Abdominal pain and distention, probably ileus related to number one:      Will check abdominal films and rule out obstruction.  3.  Hyponatremia, secondary to number one, most likely with p.o.  problems,      possibly exacerbated by Corzide.  Will probably be __________.  4.  Hypokalemia, most likely secondary to nausea and vomiting:  Will replace      IV.  5.  Renal insufficiency, overall moderate and acute, most likely secondary      to number one:  Treatment as above.  Follow labs.  6.  Hypertension:  Continue home medications.  7.  Hypercholesteremia:  Continue home medications.  8.  Fibromyalgia:   Continue home medications.  9.  Patient's report of increased uterine thickness recently:  Will check a      trans-vaginal ultrasound.  Most likely needs a GYN followup s an      outpatient.  10. Anxiety, moderate to severe:  Continue medications as needed.           ______________________________  Corwin Levins, M.D. [facility]     JWJ/MEDQ  D:  10/10/2005  T:  10/10/2005  Job:  [id number]   cc:   Stacie Glaze, M.D. [facility]  [address] [place]  [place] [address]

## 2011-02-14 NOTE — Discharge Summary (Signed)
Kristin Pope, Kristin Pope                ACCOUNT NO.:  [id number]   MEDICAL RECORD NO.:  [id number]          PATIENT TYPE:  INP   LOCATION:  5727                         FACILITY:  [facility]   PHYSICIAN:  Rene Paci, M.D. LHCDATE OF BIRTH:  [birth year]   DATE OF ADMISSION:  10/09/2005  DATE OF DISCHARGE:  10/14/2005                                 DISCHARGE SUMMARY   DISCHARGE DIAGNOSES:  1.  Urinary tract infection/pyelonephritis with Escherichia coli bacteremia.  2.  Hypertension.  3.  Mild ileus.  4.  Mild hyponatremia.  5.  Hypokalemia.  6.  Anemia.   HISTORY OF PRESENT ILLNESS:  The patient is a 55 year old female who was  admitted with increasing urinary symptoms for last week prior to admission.  The patient noted increasing abdominal distension and bloating and was  brought to the emergency room by EMS.  Admitted for probable pyelonephritis.   PAST MEDICAL HISTORY:  1.  History of C-spine degenerative disc disease/spondylosis.  2.  Hypercholesterolemia.  3.  Chronic fatigue with fibromyalgia.  4.  Hypertension.  5.  Obesity.   HISTORY OF COURSE AND HOSPITALIZATION:  1.  UTI/pyelonephritis with E. coli bacteremia.  The patient was admitted      and underwent urine culture.  Urine culture grew E. coli which was Cipro      sensitive.  In addition, blood cultures grew E. coli as well.  The      patient was maintained on IV Cipro which was changed to p.o. Cipro.  She      is currently afebrile.  2.  Mild ileus.  The patient was noted to have abdominal distension and KUB      was performed which showed mild ileus but no obstruction or free air.      The patient was placed on clear liquids and her diet was advanced.  The      patient is currently tolerating p.o.  3.  Mild hyponatremia.  Plan to hold hydrochlorothiazide secondary to      hyponatremia until follow up with Dr. Darryll Capers.  Other BP      medications as prior to admission.  4.  Anemia.  The patient was noted to  have stable hemoglobin and hematocrit      which was normocytic.  Hemoglobin is 9.6, hematocrit 28.  The patient      will need outpatient further evaluation for anemia.   MEDICATIONS AT DISCHARGE:  1.  Chlorzoxazone 500 mg p.o. t.i.d.  2.  Lexapro 20 mg p.o. daily.  3.  Cipro 500 mg p.o. b.i.d. for seven additional days.  4.  Alprazolam 0.5 mg one to two tabs p.o. daily.  5.  Corzide 40/5 one tab p.o. daily.  6.  Ambien 10 mg one-half tab p.o. at bedtime.  7.  Bupropion ER 200 mg p.o. b.i.d.  8.  Cyclobenzaprine 10 mg p.o. daily.  9.  Ibuprofen 800 mg p.o. as needed.  10. Camilla 0.35 mg p.o. daily.  11. Micardis 40 mg p.o. daily.   LABORATORIES AT DISCHARGE:  Sodium 130, potassium 3.6, BUN 10, creatinine  1.1.  Hemoglobin 9.6, hematocrit.   FOLLOWUP:  The patient is instructed to follow up with Dr. Darryll Capers in 1-  2 weeks.  She is also instructed to call Dr. Lovell Sheehan should she develop  blood in the urine, nausea, vomiting or fever over 101.      Melissa S. Peggyann Juba, NP      Rene Paci, M.D. [facility]  Electronically Signed    MSO/MEDQ  D:  10/14/2005  T:  10/14/2005  Job:  [id number]   cc:   Stacie Glaze, M.D. [facility]  [address] [place]  [place] [address]

## 2011-02-20 ENCOUNTER — Encounter: Payer: Self-pay | Admitting: Pulmonary Disease

## 2011-02-26 ENCOUNTER — Ambulatory Visit (HOSPITAL_COMMUNITY): Payer: 59 | Admitting: Physician Assistant

## 2011-02-26 DIAGNOSIS — F339 Major depressive disorder, recurrent, unspecified: Secondary | ICD-10-CM

## 2011-02-26 NOTE — Op Note (Signed)
  NAMELANDI, BISCARDI               ACCOUNT NO.:  [id number]  MEDICAL RECORD NO.:  [id number]           PATIENT TYPE:  O  LOCATION:  [facility]                          FACILITY:  [facility]  PHYSICIAN:  Genia Del, M.D.DATE OF BIRTH:  1956-04-01  DATE OF PROCEDURE: DATE OF DISCHARGE:                              OPERATIVE REPORT   PREOPERATIVE DIAGNOSIS:  Postmenopausal bleeding with endometrial polyp on endometrial biopsy.  POSTOPERATIVE DIAGNOSIS:  Postmenopausal bleeding with endometrial polyp on endometrial biopsy with small polyp close to right ostium.  PROCEDURE:  Hysteroscopy, resection, dilatation, and curettage.  SURGEON:  Genia Del, MD  ASSISTANTS:  None.  ANESTHESIOLOGIST:  Brayton Caves, MD  PROCEDURE IN DETAIL:  Under general anesthesia with laryngeal mask, the patient is in lithotomy position.  She was prepped with Betadine on the suprapubic vulvar and vaginal areas and draped as usual.  The bladderwas catheterized.  We performed a vaginal exam revealing a retroverted uterus, normal volume, no adnexal mass.  The speculum was introduced in the vagina.  The anterior lip of the cervix was grasped with a tenaculum.  A paracervical block was done with Nesacaine 1%, a total of 20 mL at 4 and 8 o'clock.  We dilated the cervix with Hegar dilators up to #23 without difficulty.  The hysterometry is 8 cm.  We then proceeded with a diagnostic hysteroscopy revealing a very small less than 1 cm polyp at the right ostium on the posterior wall.  We visualized both ostia and no other pathology was noted inside the uterus.  We removed the diagnostic hysteroscope and continue with an endometrial curettage. We used a sharp curette and curettaged all intrauterine walls going more specifically towards the right ostium.  We then verified again with the diagnostic hysteroscope and noted that the polyp was still present.  We tried with a clamp and then with the small endocervical  rectangular curette, but the small polyp resisted removal.  We therefore decided to proceed with an operative hysteroscope for resection with single loop. We dilated the cervix further up to a #33 without difficulty.  We then introduced the resectoscope, resect the small polyp, and remove the specimen.  We sent the curettage and resection specimen together.  We have good hemostasis.  We remove all instruments.  The patient received Ancef 1 g IV before induction.  The fluid deficit was 310 mL.  The estimated blood loss was minimal.  No complications occurred, and the patient was brought to recovery room in good stable status.     Genia Del, M.D.     ML/MEDQ  D:  02/03/2011  T:  02/04/2011  Job:  [id number]  Electronically Signed by Genia Del M.D. on 02/26/2011 05:04:19 PM

## 2011-03-03 ENCOUNTER — Encounter: Payer: Self-pay | Admitting: Pulmonary Disease

## 2011-03-03 ENCOUNTER — Ambulatory Visit (INDEPENDENT_AMBULATORY_CARE_PROVIDER_SITE_OTHER): Payer: 59 | Admitting: Pulmonary Disease

## 2011-03-03 DIAGNOSIS — G4733 Obstructive sleep apnea (adult) (pediatric): Secondary | ICD-10-CM

## 2011-03-03 DIAGNOSIS — G4700 Insomnia, unspecified: Secondary | ICD-10-CM

## 2011-03-03 DIAGNOSIS — G4721 Circadian rhythm sleep disorder, delayed sleep phase type: Secondary | ICD-10-CM

## 2011-03-03 HISTORY — DX: Insomnia, unspecified: G47.00

## 2011-03-03 HISTORY — DX: Circadian rhythm sleep disorder, delayed sleep phase type: G47.21

## 2011-03-03 HISTORY — DX: Obstructive sleep apnea (adult) (pediatric): G47.33

## 2011-03-03 NOTE — Progress Notes (Signed)
  Subjective:    Patient ID: Kristin Pope, female    DOB: 04-29-1956, 55 y.o.   MRN: [id number]  HPI The pt is a 54y/o female who I have been asked to see for multiple sleep issues.  She has a diagnosis of mild osa in 2009, with AHI 13/hr.  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  She typically goes to bed btw 2-4am, and takes [place] and "muscle relaxant" to help with sleep.  She has frequent awakenings during the night, and gets up btw 4-7pm to start her day.  She does not feel rested upon arising.  She admits that she does snore, but unsure if having apneas.  She complains of sleepiness during her waking hours, but also takes meds that can cause this.  She notes difficulty getting back to sleep once she awakens due to "mind racing".    She also admits to multiple issues with chronic pain which interfere with sleep.  Her epworth score today is high at 17.   Review of Systems  Constitutional: Positive for appetite change. Negative for fever and unexpected weight change.  HENT: Positive for congestion and dental problem. Negative for ear pain, nosebleeds, sore throat, rhinorrhea, sneezing, trouble swallowing, postnasal drip and sinus pressure.   Eyes: Negative for redness and itching.  Respiratory: Positive for shortness of breath. Negative for cough, chest tightness and wheezing.   Cardiovascular: Negative for palpitations and leg swelling.  Gastrointestinal: Positive for abdominal pain. Negative for nausea and vomiting.  Genitourinary: Negative for dysuria.  Musculoskeletal: Positive for joint swelling.  Skin: Negative for rash.  Neurological: Negative for headaches.  Hematological: Does not bruise/bleed easily.  Psychiatric/Behavioral: Positive for dysphoric mood. The patient is nervous/anxious.        Objective:   Physical Exam Constitutional:  Well developed, no acute distress  HENT:  Nares patent without discharge  Oropharynx  without exudate, palate and uvula are moderately elongated.   Eyes:  Perrla, eomi, no scleral icterus  Neck:  No JVD, no TMG  Cardiovascular:  Normal rate, regular rhythm, no rubs or gallops.  No murmurs        Intact distal pulses  Pulmonary :  Normal breath sounds, no stridor or respiratory distress   No rales, rhonchi, or wheezing  Abdominal:  Soft, nondistended, bowel sounds present.  No tenderness noted.   Musculoskeletal:  +1 lower extremity edema noted.  Lymph Nodes:  No cervical lymphadenopathy noted  Skin:  No cyanosis noted  Neurologic:  Appears sleepy, ?slurred speech, moves all 4 extremities without obvious deficit.         Assessment & Plan:

## 2011-03-03 NOTE — Patient Instructions (Signed)
Will work on establishing more of a normal sleep schedule first, then work on insomnia, then see if sleep apnea is really an issue for you.  Establish a bedtime of 2am and you must get up at 7am every am without fail.  Please expose yourself as much as possible to morning sunlight.  No napping during day of any kind.  After doing this for a week, would advance bedtime to 130am, then to 1am a week after that. Do not stay in bed if you cannot initiate sleep within .  Leave bedroom and go to family room to watch tv or read under low lights.  No eating or drinking.  No computers after 10pm at night. Take melatonin 1mg  each night at 10pm Do not ever watch tv in bed or read in bed. followup with me in 4 weeks.

## 2011-03-07 ENCOUNTER — Encounter (HOSPITAL_COMMUNITY): Payer: 59 | Admitting: Licensed Clinical Social Worker

## 2011-03-10 ENCOUNTER — Other Ambulatory Visit: Payer: Self-pay | Admitting: Internal Medicine

## 2011-03-13 ENCOUNTER — Encounter: Payer: Self-pay | Admitting: Pulmonary Disease

## 2011-03-13 NOTE — Assessment & Plan Note (Addendum)
The pt either truly has delayed sleep phase syndrome that dates back to childhood and part of her genetic makeup, or has self induced sleep schedule issues over the many yrs that she is unwilling or unable to change.  I have explained that her sleep hours are unnatural, and often associated with fragmentation and awakenings.  I also think her meds and ?chronic pain are contributing to her sleepiness and sleep disruption as well.  Successful treatment of this will require great sacrifices on her part, and I have outlined some behavioral techniques that she needs to work on.  I also suspect there is underlying psychopathology underlying her  sleep issues as well, and would recommend referral to a behavioral specialist.

## 2011-03-13 NOTE — Assessment & Plan Note (Signed)
On top of all of her other sleep issues, she also has insomnia.  She has difficulty initiating sleep once she awakens during the day.  A lot of this is related to the time of day she is trying to sleep, but she also notes "unable to turn off brain".  I think she needs to address her sleep schedule problem first, then see if her insomnia improves.  I think she is going to need treatment with a behavioral specialist of some type in order to get thru this.

## 2011-03-13 NOTE — Assessment & Plan Note (Signed)
The pt has a history of mild osa, and may have worsened over the years since??  Regardless, this is the least of her sleep issues currently.  She will not be able to tolerate cpap or a dental appliance until she is able to get control of her sleep hygiene, sleep schecdule, and insomnia.  I have encouraged her to work aggressively on weight loss.

## 2011-03-19 ENCOUNTER — Encounter (HOSPITAL_COMMUNITY): Payer: 59 | Admitting: Psychiatry

## 2011-03-20 ENCOUNTER — Encounter (INDEPENDENT_AMBULATORY_CARE_PROVIDER_SITE_OTHER): Payer: 59 | Admitting: Physician Assistant

## 2011-03-20 DIAGNOSIS — F339 Major depressive disorder, recurrent, unspecified: Secondary | ICD-10-CM

## 2011-03-21 ENCOUNTER — Encounter: Payer: Self-pay | Admitting: Pulmonary Disease

## 2011-03-24 ENCOUNTER — Ambulatory Visit: Payer: 59 | Admitting: Pulmonary Disease

## 2011-03-25 ENCOUNTER — Encounter (HOSPITAL_BASED_OUTPATIENT_CLINIC_OR_DEPARTMENT_OTHER): Payer: 59 | Admitting: Licensed Clinical Social Worker

## 2011-03-25 DIAGNOSIS — F332 Major depressive disorder, recurrent severe without psychotic features: Secondary | ICD-10-CM

## 2011-03-31 ENCOUNTER — Encounter (HOSPITAL_COMMUNITY): Payer: 59 | Admitting: Licensed Clinical Social Worker

## 2011-04-04 ENCOUNTER — Ambulatory Visit (INDEPENDENT_AMBULATORY_CARE_PROVIDER_SITE_OTHER): Payer: 59 | Admitting: Internal Medicine

## 2011-04-04 ENCOUNTER — Encounter: Payer: Self-pay | Admitting: Internal Medicine

## 2011-04-04 VITALS — BP 150/90 | HR 88 | Temp 98.2°F | Resp 16 | Ht 59.0 in | Wt 170.0 lb

## 2011-04-04 DIAGNOSIS — M797 Fibromyalgia: Secondary | ICD-10-CM

## 2011-04-04 DIAGNOSIS — D518 Other vitamin B12 deficiency anemias: Secondary | ICD-10-CM

## 2011-04-04 DIAGNOSIS — IMO0001 Reserved for inherently not codable concepts without codable children: Secondary | ICD-10-CM

## 2011-04-04 DIAGNOSIS — R928 Other abnormal and inconclusive findings on diagnostic imaging of breast: Secondary | ICD-10-CM

## 2011-04-04 DIAGNOSIS — D519 Vitamin B12 deficiency anemia, unspecified: Secondary | ICD-10-CM

## 2011-04-04 MED ORDER — CYANOCOBALAMIN 1000 MCG/ML IJ SOLN: 1000.0000 ug | Freq: Once | INTRAMUSCULAR | Status: DC

## 2011-04-04 MED ORDER — NADOLOL 40 MG PO TABS: 40.0000 mg | ORAL_TABLET | Freq: Every day | ORAL | Status: DC

## 2011-04-04 MED ORDER — CYANOCOBALAMIN 1000 MCG/ML IJ SOLN
1000.0000 ug | Freq: Once | INTRAMUSCULAR | Status: AC
  Administered 2011-04-04: 1000 ug via INTRAMUSCULAR

## 2011-04-04 MED ORDER — POTASSIUM CHLORIDE ER 8 MEQ PO TBCR: 8.0000 meq | EXTENDED_RELEASE_TABLET | Freq: Every day | ORAL | Status: DC

## 2011-04-04 NOTE — Progress Notes (Signed)
Subjective:    Patient ID: Kristin Pope, female    DOB: 10-22-1955, 55 y.o.   MRN: [id number]  HPIPt   See post ER visit 2/10 for colitis Had Admission in Nov 2011 for colitis with perforation and had frequent pain in Feb The pt has noted improvement The pts fibromyalgia is stable but still as functioning issues The viibryd has helped The pt is due mamography    Review of Systems  Constitutional: Positive for fever and fatigue. Negative for activity change and appetite change.  HENT: Negative for ear pain, congestion, neck pain, postnasal drip and sinus pressure.   Eyes: Negative for redness and visual disturbance.  Respiratory: Negative for cough, shortness of breath and wheezing.   Gastrointestinal: Positive for abdominal pain and abdominal distention.  Genitourinary: Negative for dysuria, frequency and menstrual problem.  Musculoskeletal: Positive for back pain. Negative for myalgias, joint swelling and arthralgias.  Skin: Negative for rash and wound.  Neurological: Negative for dizziness, weakness and headaches.  Hematological: Negative for adenopathy. Does not bruise/bleed easily.  Psychiatric/Behavioral: Negative for sleep disturbance and decreased concentration.   Past Medical History  Diagnosis Date  . Allergy   . Depression   . Fibromyalgia   . Hypertension   . Hyperlipidemia   . Low back pain    Past Surgical History  Procedure Date  . Hysteroscopy 01/2011    reports that she quit smoking about 26 years ago. She does not have any smokeless tobacco history on file. She reports that she does not drink alcohol or use illicit drugs. family history includes Asthma in her mother; Brain cancer in her paternal grandfather; Breast cancer in her maternal aunt, maternal grandmother, and sister; Coronary artery disease in her brother; Heart attack in her mother; Heart disease in her mother; Lung cancer in her paternal grandfather and sister; Rheum arthritis in her maternal  grandmother; and Stroke in her maternal grandfather. Allergies  Allergen Reactions  . Codeine     REACTION: Insomnia  . Hydrocodone-Acetaminophen     REACTION: Nausea Vomiting Fluid retention  . Tetracycline     REACTION: Nausea Vomiting       Objective:   Physical Exam  Constitutional: She is oriented to person, place, and time. She appears well-developed and well-nourished. No distress.  HENT:  Head: Normocephalic and atraumatic.  Right Ear: External ear normal.  Left Ear: External ear normal.  Nose: Nose normal.  Mouth/Throat: Oropharynx is clear and moist.  Eyes: Conjunctivae and EOM are normal. Pupils are equal, round, and reactive to light.  Neck: Normal range of motion. Neck supple. No JVD present. No tracheal deviation present. No thyromegaly present.  Cardiovascular: Normal rate, regular rhythm, normal heart sounds and intact distal pulses.   No murmur heard. Pulmonary/Chest: Effort normal and breath sounds normal. She has no wheezes. She exhibits no tenderness.  Abdominal: She exhibits no mass. There is tenderness. There is no rebound and no guarding.  Musculoskeletal: Normal range of motion. She exhibits no edema and no tenderness.  Lymphadenopathy:    She has no cervical adenopathy.  Neurological: She is alert and oriented to person, place, and time. She has normal reflexes. No cranial nerve deficit.  Skin: Skin is warm and dry. She is not diaphoretic.  Psychiatric: She has a normal mood and affect. Her behavior is normal.          Assessment & Plan:  Patient is due to breast specific gamma imaging and an order will be placed for the  [facility].  Fibromyalgia is stable with decreased functioning levels The viibryd has helped mood    hypertension stable No evidence of recurrent colitis

## 2011-04-08 ENCOUNTER — Ambulatory Visit (HOSPITAL_COMMUNITY): Payer: 59 | Admitting: Physician Assistant

## 2011-04-08 ENCOUNTER — Encounter (HOSPITAL_COMMUNITY): Payer: 59 | Admitting: Physician Assistant

## 2011-04-09 ENCOUNTER — Ambulatory Visit: Payer: 59 | Admitting: Internal Medicine

## 2011-04-10 ENCOUNTER — Other Ambulatory Visit: Payer: Self-pay | Admitting: *Deleted

## 2011-04-10 MED ORDER — ALPRAZOLAM 0.5 MG PO TABS: 0.5000 mg | ORAL_TABLET | Freq: Three times a day (TID) | ORAL | Status: DC | PRN

## 2011-04-11 ENCOUNTER — Other Ambulatory Visit: Payer: Self-pay | Admitting: Internal Medicine

## 2011-04-11 DIAGNOSIS — M549 Dorsalgia, unspecified: Secondary | ICD-10-CM

## 2011-04-16 ENCOUNTER — Encounter (HOSPITAL_COMMUNITY): Payer: 59 | Admitting: Licensed Clinical Social Worker

## 2011-04-18 ENCOUNTER — Encounter (HOSPITAL_BASED_OUTPATIENT_CLINIC_OR_DEPARTMENT_OTHER): Payer: 59 | Admitting: Licensed Clinical Social Worker

## 2011-04-18 DIAGNOSIS — F332 Major depressive disorder, recurrent severe without psychotic features: Secondary | ICD-10-CM

## 2011-04-30 ENCOUNTER — Encounter: Payer: Self-pay | Admitting: Internal Medicine

## 2011-05-02 ENCOUNTER — Encounter: Payer: Self-pay | Admitting: Internal Medicine

## 2011-05-08 ENCOUNTER — Telehealth: Payer: Self-pay | Admitting: Internal Medicine

## 2011-05-08 NOTE — Telephone Encounter (Signed)
Pt is requesting a UA. She is coming in tomorrow 05/09/11 at 12:00 for a b12 and was interested in having it done then.

## 2011-05-08 NOTE — Telephone Encounter (Signed)
That will be fine. 

## 2011-05-09 ENCOUNTER — Ambulatory Visit (INDEPENDENT_AMBULATORY_CARE_PROVIDER_SITE_OTHER): Payer: 59 | Admitting: Internal Medicine

## 2011-05-09 ENCOUNTER — Other Ambulatory Visit: Payer: 59

## 2011-05-09 ENCOUNTER — Other Ambulatory Visit: Payer: 59 | Admitting: Internal Medicine

## 2011-05-09 DIAGNOSIS — D519 Vitamin B12 deficiency anemia, unspecified: Secondary | ICD-10-CM

## 2011-05-09 DIAGNOSIS — N390 Urinary tract infection, site not specified: Secondary | ICD-10-CM

## 2011-05-09 DIAGNOSIS — D518 Other vitamin B12 deficiency anemias: Secondary | ICD-10-CM

## 2011-05-09 LAB — POCT URINALYSIS DIPSTICK
Ketones, UA: NEGATIVE
Spec Grav, UA: 1.015

## 2011-05-09 MED ORDER — CYANOCOBALAMIN 1000 MCG/ML IJ SOLN
1000.0000 ug | INTRAMUSCULAR | Status: DC
  Administered 2011-05-09: 1000 ug via INTRAMUSCULAR

## 2011-05-11 LAB — URINE CULTURE

## 2011-06-11 ENCOUNTER — Telehealth: Payer: Self-pay | Admitting: Internal Medicine

## 2011-06-11 ENCOUNTER — Encounter: Payer: Self-pay | Admitting: Internal Medicine

## 2011-06-11 ENCOUNTER — Ambulatory Visit (INDEPENDENT_AMBULATORY_CARE_PROVIDER_SITE_OTHER): Payer: 59 | Admitting: Internal Medicine

## 2011-06-11 VITALS — BP 140/90 | HR 76 | Temp 98.2°F | Resp 16 | Ht 59.0 in | Wt 172.0 lb

## 2011-06-11 DIAGNOSIS — D518 Other vitamin B12 deficiency anemias: Secondary | ICD-10-CM

## 2011-06-11 DIAGNOSIS — IMO0001 Reserved for inherently not codable concepts without codable children: Secondary | ICD-10-CM

## 2011-06-11 DIAGNOSIS — D519 Vitamin B12 deficiency anemia, unspecified: Secondary | ICD-10-CM

## 2011-06-11 DIAGNOSIS — M797 Fibromyalgia: Secondary | ICD-10-CM

## 2011-06-11 DIAGNOSIS — F329 Major depressive disorder, single episode, unspecified: Secondary | ICD-10-CM

## 2011-06-11 MED ORDER — CYANOCOBALAMIN 1000 MCG/ML IJ SOLN
1000.0000 ug | INTRAMUSCULAR | Status: DC
  Administered 2011-06-11: 1000 ug via INTRAMUSCULAR

## 2011-06-11 MED ORDER — L-METHYLFOLATE 7.5 MG PO TABS: 1.0000 | ORAL_TABLET | Freq: Every day | ORAL | Status: DC

## 2011-06-11 MED ORDER — ESTAZOLAM 2 MG PO TABS: 2.0000 mg | ORAL_TABLET | Freq: Every day | ORAL | Status: DC

## 2011-06-11 NOTE — Telephone Encounter (Signed)
Just left ov- dr Lovell Sheehan can discuss at next [facility]

## 2011-06-11 NOTE — Telephone Encounter (Signed)
Pt had questions re: Sensa and any of the other weight loss meds. Pt is req that Dr Lovell Sheehan research this and let her know what he thinks about them and if she would be a good candidate for any of them.

## 2011-06-29 NOTE — Progress Notes (Signed)
  Subjective:    Patient ID: Kristin Pope, female    DOB: [birth year], 55 y.o.   MRN: [id number]  HPI  This is one of many visits for this complicated 55 year old white female with hypertension hyperlipidemia gastroesophageal reflux depression probable fibromyalgia type pain.  She states her functioning status has not been well she's had increased pain decreased ability to concentrate.  She has moderate to severe muscle ache and pain. She has not been able to function or to find employment she remains functionally disabled. Diabetes is uncontrolled and her CBGs have ranged from 120-140 Her blood pressure is stable  Review of Systems  Constitutional: Negative for activity change, appetite change and fatigue.  HENT: Negative for ear pain, congestion, neck pain, postnasal drip and sinus pressure.   Eyes: Negative for redness and visual disturbance.  Respiratory: Negative for cough, shortness of breath and wheezing.   Gastrointestinal: Negative for abdominal pain and abdominal distention.  Genitourinary: Negative for dysuria, frequency and menstrual problem.  Musculoskeletal: Negative for myalgias, joint swelling and arthralgias.  Skin: Negative for rash and wound.  Neurological: Negative for dizziness, weakness and headaches.  Hematological: Negative for adenopathy. Does not bruise/bleed easily.  Psychiatric/Behavioral: Negative for sleep disturbance and decreased concentration.       Objective:   Physical Exam  Nursing note and vitals reviewed. Constitutional: She appears distressed.       In moderate distress  HENT:  Head: Normocephalic and atraumatic.  Eyes: Conjunctivae are normal. Pupils are equal, round, and reactive to light.  Neck: Normal range of motion. Neck supple.  Cardiovascular: Normal rate and regular rhythm.   Pulmonary/Chest: Effort normal and breath sounds normal.  Abdominal: Soft. Bowel sounds are normal.  Musculoskeletal: She exhibits edema and tenderness.       Trace edema of lower extremities with pressure point tenderness in all 9. Areas Functional assessment moderate to severe functional impairment          Assessment & Plan:  Continued moderate to severe functional impairment from her fibromyalgia other complicating disease conditions of diabetes poorly controlled hypertension hyperlipidemia and gastroesophageal reflux Review of medications refill as needed samples of her antidepressant were given she has reponded to this partially s but still has some functional depression as well

## 2011-06-30 ENCOUNTER — Telehealth: Payer: Self-pay | Admitting: Internal Medicine

## 2011-06-30 NOTE — Telephone Encounter (Signed)
Generic Prosom 2 mg  was sent elactronically to pharmacy. It is a class 2 drug and it must be faxed back to [facility]. Thanks.

## 2011-06-30 NOTE — Telephone Encounter (Signed)
Called into pharmacy

## 2011-07-24 ENCOUNTER — Telehealth: Payer: Self-pay

## 2011-07-24 NOTE — Telephone Encounter (Signed)
Pt called and stated that this time last year she was in the hospital for peritonitis and diverticulitis.  Pt states she is having bowel problems as well as she has some BP concerns and kidney concerns.  Pt states she has a follow up appt scheduled for Dec. 12, 2012 but would like to know if she could be worked in sooner. Pls advise.

## 2011-07-24 NOTE — Telephone Encounter (Signed)
She can have on of the open visit on 11-12 thanks -will you please call and schedule?

## 2011-08-11 ENCOUNTER — Other Ambulatory Visit: Payer: Self-pay | Admitting: Internal Medicine

## 2011-08-11 ENCOUNTER — Encounter: Payer: Self-pay | Admitting: Internal Medicine

## 2011-08-11 ENCOUNTER — Ambulatory Visit (INDEPENDENT_AMBULATORY_CARE_PROVIDER_SITE_OTHER): Payer: 59 | Admitting: Internal Medicine

## 2011-08-11 VITALS — BP 144/80 | HR 80 | Temp 98.1°F | Resp 16 | Ht 59.0 in | Wt 174.0 lb

## 2011-08-11 DIAGNOSIS — F988 Other specified behavioral and emotional disorders with onset usually occurring in childhood and adolescence: Secondary | ICD-10-CM

## 2011-08-11 DIAGNOSIS — D518 Other vitamin B12 deficiency anemias: Secondary | ICD-10-CM

## 2011-08-11 DIAGNOSIS — R5381 Other malaise: Secondary | ICD-10-CM

## 2011-08-11 DIAGNOSIS — R5383 Other fatigue: Secondary | ICD-10-CM

## 2011-08-11 DIAGNOSIS — F339 Major depressive disorder, recurrent, unspecified: Secondary | ICD-10-CM

## 2011-08-11 DIAGNOSIS — F19982 Other psychoactive substance use, unspecified with psychoactive substance-induced sleep disorder: Secondary | ICD-10-CM

## 2011-08-11 DIAGNOSIS — E039 Hypothyroidism, unspecified: Secondary | ICD-10-CM

## 2011-08-11 DIAGNOSIS — Z23 Encounter for immunization: Secondary | ICD-10-CM

## 2011-08-11 DIAGNOSIS — D519 Vitamin B12 deficiency anemia, unspecified: Secondary | ICD-10-CM

## 2011-08-11 MED ORDER — CYANOCOBALAMIN 1000 MCG/ML IJ SOLN: 1000.0000 ug | Freq: Once | INTRAMUSCULAR | Status: DC

## 2011-08-11 MED ORDER — AMPHETAMINE-DEXTROAMPHETAMINE 20 MG PO TABS: 20.0000 mg | ORAL_TABLET | Freq: Every day | ORAL | Status: DC

## 2011-08-11 NOTE — Patient Instructions (Signed)
Take the adderal in the morning and stop if you feel to agitated or angry

## 2011-08-11 NOTE — Progress Notes (Signed)
Subjective:    Patient ID: Kristin Pope, female    DOB: Jul 01, 1956, 55 y.o.   MRN: [id number]  HPI hypersonolent Patient complains of excessive daytime somnolence with a history of fibromyalgia moderate obesity and no history of sleep apnea although she stays alone and has no one to observe the night she snores or not ... She has not had a sleep study She is on multiple medications that may cause central apnea as well as has the physiology for obstructive apnea. Her fibromyalgia is stable but is still rated as severe in terms of its effect on her daily functioning  Review of Systems  Constitutional: Positive for activity change. Negative for appetite change and fatigue.  HENT: Positive for neck stiffness. Negative for ear pain, congestion, neck pain, postnasal drip and sinus pressure.   Eyes: Negative for redness and visual disturbance.  Respiratory: Negative for cough, shortness of breath and wheezing.   Gastrointestinal: Negative for abdominal pain and abdominal distention.  Genitourinary: Negative for dysuria, frequency and menstrual problem.  Musculoskeletal: Positive for myalgias, back pain, joint swelling, arthralgias and gait problem.  Skin: Negative for rash and wound.  Neurological: Negative for dizziness, weakness and headaches.  Hematological: Negative for adenopathy. Does not bruise/bleed easily.  Psychiatric/Behavioral: Negative for sleep disturbance and decreased concentration.   Past Medical History  Diagnosis Date  . Allergy   . Depression   . Fibromyalgia   . Hypertension   . Hyperlipidemia   . Low back pain    Past Surgical History  Procedure Date  . Hysteroscopy 01/2011    reports that she quit smoking about 26 years ago. She does not have any smokeless tobacco history on file. She reports that she does not drink alcohol or use illicit drugs. family history includes Asthma in her mother; Brain cancer in her paternal grandfather; Breast cancer in her  maternal aunt, maternal grandmother, and sister; Coronary artery disease in her brother; Heart attack in her mother; Heart disease in her mother; Lung cancer in her paternal grandfather and sister; Rheum arthritis in her maternal grandmother; and Stroke in her maternal grandfather. Allergies  Allergen Reactions  . Codeine     REACTION: Insomnia  . Hydrocodone-Acetaminophen     REACTION: Nausea Vomiting Fluid retention  . Tetracycline     REACTION: Nausea Vomiting        Objective:   Physical Exam  Nursing note and vitals reviewed. Constitutional: She is oriented to person, place, and time. She appears well-developed and well-nourished. No distress.  HENT:  Head: Normocephalic and atraumatic.  Right Ear: External ear normal.  Left Ear: External ear normal.  Nose: Nose normal.  Mouth/Throat: Oropharynx is clear and moist.  Eyes: Conjunctivae and EOM are normal. Pupils are equal, round, and reactive to light.  Neck: Normal range of motion. Neck supple. No JVD present. No tracheal deviation present. No thyromegaly present.  Cardiovascular: Normal rate, regular rhythm, normal heart sounds and intact distal pulses.   No murmur heard. Pulmonary/Chest: Effort normal and breath sounds normal. She has no wheezes. She exhibits no tenderness.  Abdominal: Soft. Bowel sounds are normal.  Musculoskeletal: She exhibits edema and tenderness.  Lymphadenopathy:    She has no cervical adenopathy.  Neurological: She is alert and oriented to person, place, and time. She has normal reflexes. No cranial nerve deficit.  Skin: Skin is warm and dry. She is not diaphoretic.  Psychiatric: She has a normal mood and affect. Her behavior is normal.  Assessment & Plan:  Add ADD drug for the hypersonolence Refer for sleep study Continued pain management for fibromyalgia pain Urge weight loss Stable hypertension

## 2011-08-13 ENCOUNTER — Other Ambulatory Visit: Payer: Self-pay | Admitting: *Deleted

## 2011-08-13 MED ORDER — ALPRAZOLAM 0.5 MG PO TABS: 0.5000 mg | ORAL_TABLET | Freq: Three times a day (TID) | ORAL | Status: DC | PRN

## 2011-08-15 ENCOUNTER — Other Ambulatory Visit: Payer: Self-pay | Admitting: *Deleted

## 2011-09-10 ENCOUNTER — Encounter: Payer: Self-pay | Admitting: Internal Medicine

## 2011-09-10 ENCOUNTER — Other Ambulatory Visit: Payer: Self-pay | Admitting: Internal Medicine

## 2011-09-10 ENCOUNTER — Ambulatory Visit (INDEPENDENT_AMBULATORY_CARE_PROVIDER_SITE_OTHER): Payer: 59 | Admitting: Internal Medicine

## 2011-09-10 VITALS — BP 130/80 | HR 76 | Temp 98.3°F | Resp 16 | Ht 59.0 in | Wt 178.0 lb

## 2011-09-10 DIAGNOSIS — IMO0001 Reserved for inherently not codable concepts without codable children: Secondary | ICD-10-CM

## 2011-09-10 DIAGNOSIS — N390 Urinary tract infection, site not specified: Secondary | ICD-10-CM

## 2011-09-10 DIAGNOSIS — F329 Major depressive disorder, single episode, unspecified: Secondary | ICD-10-CM

## 2011-09-10 DIAGNOSIS — M797 Fibromyalgia: Secondary | ICD-10-CM

## 2011-09-10 DIAGNOSIS — D518 Other vitamin B12 deficiency anemias: Secondary | ICD-10-CM

## 2011-09-10 DIAGNOSIS — D519 Vitamin B12 deficiency anemia, unspecified: Secondary | ICD-10-CM

## 2011-09-10 DIAGNOSIS — M62838 Other muscle spasm: Secondary | ICD-10-CM

## 2011-09-10 MED ORDER — DULOXETINE HCL 60 MG PO CPEP: 60.0000 mg | ORAL_CAPSULE | Freq: Every day | ORAL | Status: DC

## 2011-09-10 MED ORDER — CYANOCOBALAMIN 1000 MCG/ML IJ SOLN
1000.0000 ug | INTRAMUSCULAR | Status: AC
  Administered 2011-09-10: 1000 ug via INTRAMUSCULAR

## 2011-09-10 MED ORDER — CHLORZOXAZONE 500 MG PO TABS: 500.0000 mg | ORAL_TABLET | Freq: Four times a day (QID) | ORAL | Status: DC | PRN

## 2011-09-10 MED ORDER — NITROFURANTOIN MACROCRYSTAL 100 MG PO CAPS: 100.0000 mg | ORAL_CAPSULE | Freq: Every day | ORAL | Status: DC

## 2011-09-10 MED ORDER — L-METHYLFOLATE-B6-B12 3-35-2 MG PO TABS: 1.0000 | ORAL_TABLET | Freq: Every day | ORAL | Status: DC

## 2011-09-10 NOTE — Patient Instructions (Addendum)
The patient is instructed to continue all medications as prescribed. Schedule followup with check out clerk upon leaving the clinic Stop the viibryd End start Cymbalta 60 mg one by mouth daily Do not start the Adderall until after you've been on Cymbalta for at least 4 weeks.

## 2011-09-10 NOTE — Progress Notes (Signed)
Subjective:    Patient ID: Kristin Pope, female    DOB: 06/14/1956, 55 y.o.   MRN: [id number]  HPI Increased anxiety and anger with the use of the viibryd Hx of fibromyalgia and depression Has used Cymbalta, pristiq and Wellbutrin and celexa together. She states that she has increased stress because her mother has been diagnosed with breast mass. She also continues to have significant back pain She denies any suicidal or homicidal ideation. GERD and constipation are stable with multiple medications     Review of Systems  Constitutional: Negative for activity change, appetite change and fatigue.  HENT: Negative for ear pain, congestion, neck pain, postnasal drip and sinus pressure.   Eyes: Negative for redness and visual disturbance.  Respiratory: Negative for cough, shortness of breath and wheezing.   Gastrointestinal: Negative for abdominal pain and abdominal distention.  Genitourinary: Negative for dysuria, frequency and menstrual problem.  Musculoskeletal: Negative for myalgias, joint swelling and arthralgias.  Skin: Negative for rash and wound.  Neurological: Negative for dizziness, weakness and headaches.  Hematological: Negative for adenopathy. Does not bruise/bleed easily.  Psychiatric/Behavioral: Negative for sleep disturbance and decreased concentration.   Past Medical History  Diagnosis Date  . Allergy   . Depression   . Fibromyalgia   . Hypertension   . Hyperlipidemia   . Low back pain     History   Social History  . Marital Status: Divorced    Spouse Name: N/A    Number of Children: N/A  . Years of Education: N/A   Occupational History  . retired    Social History Main Topics  . Smoking status: Former Smoker    Quit date: 09/29/1984  . Smokeless tobacco: Not on file  . Alcohol Use: No  . Drug Use: No  . Sexually Active: Not Currently   Other Topics Concern  . Not on file   Social History Narrative  . No narrative on file    Past Surgical  History  Procedure Date  . Hysteroscopy 01/2011    Family History  Problem Relation Age of Onset  . Coronary artery disease Brother   . Heart disease Mother   . Heart attack Mother   . Breast cancer Sister   . Lung cancer Sister   . Breast cancer Maternal Aunt   . Breast cancer Maternal Grandmother   . Stroke Maternal Grandfather   . Brain cancer Paternal Grandfather   . Asthma Mother   . Lung cancer Paternal Grandfather   . Rheum arthritis Maternal Grandmother     Allergies  Allergen Reactions  . Codeine     REACTION: Insomnia  . Hydrocodone-Acetaminophen     REACTION: Nausea Vomiting Fluid retention  . Tetracycline     REACTION: Nausea Vomiting    Current Outpatient Prescriptions on File Prior to Visit  Medication Sig Dispense Refill  . ALPRAZolam (XANAX) 0.5 MG tablet Take 1 tablet (0.5 mg total) by mouth every 8 (eight) hours as needed.  90 tablet  3  . amphetamine-dextroamphetamine (ADDERALL, 20MG ,) 20 MG tablet Take 1 tablet (20 mg total) by mouth daily.  30 tablet  0  . azelastine (ASTELIN) 137 MCG/SPRAY nasal spray Place 1 spray into the nose 2 (two) times daily as needed. Use in each nostril as directed      . budesonide-formoterol (SYMBICORT) 160-4.5 MCG/ACT inhaler Inhale 2 puffs into the lungs 2 (two) times daily as needed.       . cetirizine (ZYRTEC) 10 MG tablet Take 10  mg by mouth daily as needed.       . fexofenadine (ALLEGRA) 180 MG tablet Take 180 mg by mouth daily as needed.       . furosemide (LASIX) 40 MG tablet 1/2 daily prn       . glucose blood (FREESTYLE LITE) test strip 1 each by Other route as needed. Use as instructed       . lactase (LACTAID) 3000 UNITS tablet Take 1 tablet by mouth daily as needed.        Marland Kitchen levocetirizine (XYZAL) 5 MG tablet Take 5 mg by mouth daily.        Marland Kitchen loperamide (IMODIUM) 2 MG capsule Take 2 mg by mouth as needed.        . loratadine (CLARITIN) 10 MG tablet Take 10 mg by mouth daily as needed.       . lubiprostone  (AMITIZA) 24 MCG capsule Take 24 mcg by mouth 2 (two) times daily as needed.        . montelukast (SINGULAIR) 10 MG tablet Take 10 mg by mouth daily as needed.       . nadolol (CORGARD) 40 MG tablet Take 1 tablet (40 mg total) by mouth daily.  90 tablet  3  . omeprazole (PRILOSEC) 20 MG capsule Take 1 capsule (20 mg total) by mouth daily.  90 capsule  3  . potassium chloride (KLOR-CON) 8 MEQ tablet Take 1 tablet (8 mEq total) by mouth daily.  90 tablet  1  . ranitidine (ZANTAC) 150 MG tablet Take 150 mg by mouth daily as needed.         Current Facility-Administered Medications on File Prior to Visit  Medication Dose Route Frequency Provider Last Rate Last Dose  . cyanocobalamin ((VITAMIN B-12)) injection 1,000 mcg  1,000 mcg Intramuscular Once Carrie Mew        BP 130/80  Pulse 76  Temp 98.3 F (36.8 C)  Resp 16  Ht 4\' 11"  (1.499 m)  Wt 178 lb (80.74 kg)  BMI 35.95 kg/m2       Objective:   Physical Exam  Nursing note and vitals reviewed. Constitutional: She is oriented to person, place, and time. She appears well-developed and well-nourished. No distress.  HENT:  Head: Normocephalic and atraumatic.  Right Ear: External ear normal.  Left Ear: External ear normal.  Nose: Nose normal.  Mouth/Throat: Oropharynx is clear and moist.  Eyes: Conjunctivae and EOM are normal. Pupils are equal, round, and reactive to light.  Neck: Normal range of motion. Neck supple. No JVD present. No tracheal deviation present. No thyromegaly present.  Cardiovascular: Normal rate, regular rhythm, normal heart sounds and intact distal pulses.   No murmur heard. Pulmonary/Chest: Effort normal and breath sounds normal. She has no wheezes. She exhibits no tenderness.  Abdominal: Soft. Bowel sounds are normal.  Musculoskeletal: Normal range of motion. She exhibits no edema and no tenderness.  Lymphadenopathy:    She has no cervical adenopathy.  Neurological: She is alert and oriented to person,  place, and time. She has normal reflexes. No cranial nerve deficit.  Skin: Skin is warm and dry. She is not diaphoretic.  Psychiatric:       Tearful and agitated          Assessment & Plan:  THE PT HAS FIBROMYALGIA AND CHRONIC PAIN OR STABLE ON HER CURRENT MEDICATIONS.  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 We discussed changing her medication  to Cymbalta and believes that she should immediately stop the glyburide the tumor and the teeth.  Her chronic pain is stable on her current medications the resultant constipation for the use of pain medications is treated by the combination of fiber and amitiza  We discussed multiple muscle relaxants and decided that the Parafon Forte would be the only muscle relaxants that she uses.  We discussed her chronic insomnia and did not wish to change medications unclear we saw the effect of the Cymbalta

## 2011-10-10 ENCOUNTER — Telehealth: Payer: Self-pay | Admitting: *Deleted

## 2011-10-10 NOTE — Telephone Encounter (Signed)
This came in at 4pm on friday

## 2011-10-10 NOTE — Telephone Encounter (Signed)
Pt thinks the Cymbalta is making her muscles stiff and sore.  Wants samples of a new antidepressant to try.  Also, thinks frequent UTIs are coming from the Cymbalta.

## 2011-10-10 NOTE — Telephone Encounter (Signed)
Please advise 

## 2011-10-13 NOTE — Telephone Encounter (Signed)
LMTCB

## 2011-10-13 NOTE — Telephone Encounter (Signed)
ok 

## 2011-10-13 NOTE — Telephone Encounter (Signed)
Doubtfull that these represent side effects from the cymbalta. Tell her to give it more time most of these medications take 6 weeks or more to reach full effectiveness

## 2011-10-16 ENCOUNTER — Telehealth: Payer: Self-pay | Admitting: *Deleted

## 2011-10-16 NOTE — Telephone Encounter (Signed)
Pt was advised to give the Cymbalta about 6 weeks before making decisions on remaining on it.

## 2011-10-27 ENCOUNTER — Other Ambulatory Visit: Payer: Self-pay | Admitting: *Deleted

## 2011-10-27 MED ORDER — ESTAZOLAM 2 MG PO TABS: 2.0000 mg | ORAL_TABLET | Freq: Every day | ORAL | Status: DC

## 2011-11-12 ENCOUNTER — Ambulatory Visit (INDEPENDENT_AMBULATORY_CARE_PROVIDER_SITE_OTHER): Payer: 59 | Admitting: Internal Medicine

## 2011-11-12 ENCOUNTER — Encounter: Payer: Self-pay | Admitting: Internal Medicine

## 2011-11-12 VITALS — BP 140/80 | HR 72 | Temp 98.2°F | Resp 16 | Ht 59.0 in | Wt 166.0 lb

## 2011-11-12 DIAGNOSIS — M6281 Muscle weakness (generalized): Secondary | ICD-10-CM

## 2011-11-12 DIAGNOSIS — E162 Hypoglycemia, unspecified: Secondary | ICD-10-CM

## 2011-11-12 DIAGNOSIS — IMO0001 Reserved for inherently not codable concepts without codable children: Secondary | ICD-10-CM

## 2011-11-12 DIAGNOSIS — I10 Essential (primary) hypertension: Secondary | ICD-10-CM

## 2011-11-12 DIAGNOSIS — R5383 Other fatigue: Secondary | ICD-10-CM

## 2011-11-12 DIAGNOSIS — E785 Hyperlipidemia, unspecified: Secondary | ICD-10-CM

## 2011-11-12 DIAGNOSIS — F339 Major depressive disorder, recurrent, unspecified: Secondary | ICD-10-CM

## 2011-11-12 DIAGNOSIS — R079 Chest pain, unspecified: Secondary | ICD-10-CM

## 2011-11-12 DIAGNOSIS — K5900 Constipation, unspecified: Secondary | ICD-10-CM

## 2011-11-12 DIAGNOSIS — R5381 Other malaise: Secondary | ICD-10-CM

## 2011-11-12 DIAGNOSIS — F419 Anxiety disorder, unspecified: Secondary | ICD-10-CM

## 2011-11-12 DIAGNOSIS — F411 Generalized anxiety disorder: Secondary | ICD-10-CM

## 2011-11-12 MED ORDER — DIAZEPAM 10 MG PO TABS: 10.0000 mg | ORAL_TABLET | Freq: Three times a day (TID) | ORAL | Status: AC | PRN

## 2011-11-12 MED ORDER — DEPLIN 7.5 MG PO TABS: 1.0000 | ORAL_TABLET | Freq: Every day | ORAL | Status: DC

## 2011-11-12 NOTE — Progress Notes (Signed)
Subjective:    Patient ID: Kristin Pope, female    DOB: 05/27/1956, 56 y.o.   MRN: [id number]  HPI Patient is a 56 year old female who presents for followup of hypertension hyperlipidemia depression and fibromyalgia she states that she has had a rough course over the past few months.  She has increasing somnolence has been spending greater than 12-14 hours in bed. She states that she has had increased headaches noted that her blood pressure seemed to be elevated and occasionally she has taken an extra blood pressure medicine to keep a blood pressure check she is felt increased sinus pressure earache sore throat and adenopathy.  She also complains of chest pain. Midsternal. It is not associated with shortness of breath. She is requesting increase parafon forte. She also reported increased pain in back, neck shouders and arms Increased pain with the reduction in musclerelaxants.       Review of Systems  Constitutional: Positive for activity change, appetite change and fatigue.  HENT: Positive for neck stiffness. Negative for ear pain, congestion, neck pain, postnasal drip and sinus pressure.   Eyes: Positive for redness. Negative for visual disturbance.  Respiratory: Positive for chest tightness. Negative for cough, shortness of breath and wheezing.   Cardiovascular: Positive for chest pain.  Gastrointestinal: Positive for nausea, constipation and abdominal distention. Negative for abdominal pain.  Genitourinary: Negative for dysuria, frequency and menstrual problem.  Musculoskeletal: Positive for myalgias, back pain, joint swelling and arthralgias.  Skin: Negative for rash and wound.  Neurological: Negative for dizziness, weakness and headaches.  Hematological: Negative for adenopathy. Does not bruise/bleed easily.  Psychiatric/Behavioral: Negative for sleep disturbance and decreased concentration.       Past Medical History  Diagnosis Date  . Allergy   . Depression   .  Fibromyalgia   . Hypertension   . Hyperlipidemia   . Low back pain     History   Social History  . Marital Status: Divorced    Spouse Name: N/A    Number of Children: N/A  . Years of Education: N/A   Occupational History  . retired    Social History Main Topics  . Smoking status: Former Smoker    Quit date: 09/29/1984  . Smokeless tobacco: Not on file  . Alcohol Use: No  . Drug Use: No  . Sexually Active: Not Currently   Other Topics Concern  . Not on file   Social History Narrative  . No narrative on file    Past Surgical History  Procedure Date  . Hysteroscopy 01/2011    Family History  Problem Relation Age of Onset  . Coronary artery disease Brother   . Heart disease Mother   . Heart attack Mother   . Breast cancer Sister   . Lung cancer Sister   . Breast cancer Maternal Aunt   . Breast cancer Maternal Grandmother   . Stroke Maternal Grandfather   . Brain cancer Paternal Grandfather   . Asthma Mother   . Lung cancer Paternal Grandfather   . Rheum arthritis Maternal Grandmother     Allergies  Allergen Reactions  . Codeine     REACTION: Insomnia  . Hydrocodone-Acetaminophen     REACTION: Nausea Vomiting Fluid retention  . Tetracycline     REACTION: Nausea Vomiting    Current Outpatient Prescriptions on File Prior to Visit  Medication Sig Dispense Refill  . ALPRAZolam (XANAX) 0.5 MG tablet Take 1 tablet (0.5 mg total) by mouth every 8 (eight) hours as  needed.  90 tablet  3  . amphetamine-dextroamphetamine (ADDERALL, 20MG ,) 20 MG tablet Take 1 tablet (20 mg total) by mouth daily.  30 tablet  0  . azelastine (ASTELIN) 137 MCG/SPRAY nasal spray Place 1 spray into the nose 2 (two) times daily as needed. Use in each nostril as directed      . budesonide-formoterol (SYMBICORT) 160-4.5 MCG/ACT inhaler Inhale 2 puffs into the lungs 2 (two) times daily as needed.       . cetirizine (ZYRTEC) 10 MG tablet Take 10 mg by mouth daily as needed.       .  chlorzoxazone (PARAFON) 500 MG tablet Take 1 tablet (500 mg total) by mouth 4 (four) times daily as needed for muscle spasms.  180 tablet  2  . DULoxetine (CYMBALTA) 60 MG capsule Take 1 capsule (60 mg total) by mouth daily.  30 capsule  2  . estazolam (PROSOM) 2 MG tablet Take 1 tablet (2 mg total) by mouth at bedtime.  30 tablet  3  . fexofenadine (ALLEGRA) 180 MG tablet Take 180 mg by mouth daily as needed.       . furosemide (LASIX) 40 MG tablet 1/2 daily prn       . glucose blood (FREESTYLE LITE) test strip 1 each by Other route as needed. Use as instructed       . l-methylfolate-B6-B12 (METANX) 3-35-2 MG TABS Take 1 tablet by mouth daily.  60 tablet  11  . lactase (LACTAID) 3000 UNITS tablet Take 1 tablet by mouth daily as needed.        Marland Kitchen levocetirizine (XYZAL) 5 MG tablet Take 5 mg by mouth daily.        Marland Kitchen loperamide (IMODIUM) 2 MG capsule Take 2 mg by mouth as needed.        . loratadine (CLARITIN) 10 MG tablet Take 10 mg by mouth daily as needed.       . lubiprostone (AMITIZA) 24 MCG capsule Take 24 mcg by mouth 2 (two) times daily as needed.        . montelukast (SINGULAIR) 10 MG tablet Take 10 mg by mouth daily as needed.       . nadolol (CORGARD) 40 MG tablet Take 1 tablet (40 mg total) by mouth daily.  90 tablet  3  . nitrofurantoin (MACRODANTIN) 100 MG capsule Take 1 capsule (100 mg total) by mouth at bedtime.  90 capsule  1  . omeprazole (PRILOSEC) 20 MG capsule Take 1 capsule (20 mg total) by mouth daily.  90 capsule  3  . potassium chloride (KLOR-CON) 8 MEQ tablet Take 1 tablet (8 mEq total) by mouth daily.  90 tablet  1  . ranitidine (ZANTAC) 150 MG tablet Take 150 mg by mouth daily as needed.        . Vilazodone HCl 40 MG TABS Take 1 tablet by mouth daily.         Current Facility-Administered Medications on File Prior to Visit  Medication Dose Route Frequency Provider Last Rate Last Dose  . cyanocobalamin ((VITAMIN B-12)) injection 1,000 mcg  1,000 mcg Intramuscular Once  Carrie Mew, MD      . cyanocobalamin ((VITAMIN B-12)) injection 1,000 mcg  1,000 mcg Intramuscular Q30 days Carrie Mew, MD   1,000 mcg at 09/10/11 1412    BP 140/80  Pulse 72  Temp 98.2 F (36.8 C)  Resp 16  Ht 4\' 11"  (1.499 m)  Wt 166 lb (75.297 kg)  BMI 33.53  kg/m2    Objective:   Physical Exam  Nursing note and vitals reviewed. Constitutional: She is oriented to person, place, and time. She appears well-developed and well-nourished. No distress.  HENT:  Head: Normocephalic and atraumatic.  Right Ear: External ear normal.  Left Ear: External ear normal.  Nose: Nose normal.  Mouth/Throat: Oropharynx is clear and moist.  Eyes: Conjunctivae and EOM are normal. Pupils are equal, round, and reactive to light.  Neck: Normal range of motion. Neck supple. No JVD present. No tracheal deviation present. No thyromegaly present.  Cardiovascular: Normal rate, regular rhythm, normal heart sounds and intact distal pulses.   No murmur heard. Pulmonary/Chest: Effort normal and breath sounds normal. She has no wheezes. She exhibits no tenderness.  Abdominal: Soft. Bowel sounds are normal.  Musculoskeletal: She exhibits edema and tenderness.       Right shoulder: She exhibits tenderness.       Left shoulder: She exhibits tenderness.       Right elbow: tenderness found.       Left elbow: tenderness found.       Right hip: She exhibits tenderness.       Left hip: She exhibits tenderness.       Right knee: tenderness found.       Left knee: tenderness found.  Lymphadenopathy:    She has no cervical adenopathy.  Neurological: She is alert and oriented to person, place, and time. She has normal reflexes. No cranial nerve deficit.  Skin: Skin is warm and dry. She is not diaphoretic.  Psychiatric: She has a normal mood and affect. Her behavior is normal.          Assessment & Plan:  Patient has chronic constipation for which we have prescribed, to use it she will take  that twice a day samples were given to a compliance.  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  Her fibromyalgia has also flared and we discussed her need to be active stretching and mobile.  For monitoring of health purposes we will get a CBC differential a basic metabolic panel and a lipid panel.  She will follow back in 2 months time

## 2011-11-12 NOTE — Patient Instructions (Signed)
The patient is instructed to continue all medications as prescribed. Schedule followup with check out clerk upon leaving the clinic  

## 2011-11-13 LAB — CARDIAC PANEL
CK-MB: 0.6 ng/mL (ref 0.3–4.0)
Total CK: 34 U/L (ref 7–177)

## 2011-11-13 LAB — LDL CHOLESTEROL, DIRECT: Direct LDL: 111 mg/dL

## 2011-11-13 LAB — HEMOGLOBIN A1C: Hgb A1c MFr Bld: 5.6 % (ref 4.6–6.5)

## 2011-11-13 LAB — LIPID PANEL: Cholesterol: 201 mg/dL — ABNORMAL HIGH (ref 0–200)

## 2011-12-29 ENCOUNTER — Encounter: Payer: Self-pay | Admitting: Internal Medicine

## 2011-12-29 ENCOUNTER — Ambulatory Visit (INDEPENDENT_AMBULATORY_CARE_PROVIDER_SITE_OTHER): Payer: Medicaid Other | Admitting: Internal Medicine

## 2011-12-29 VITALS — BP 154/90 | HR 72 | Temp 98.3°F | Resp 16 | Ht 59.0 in | Wt 171.0 lb

## 2011-12-29 DIAGNOSIS — M62838 Other muscle spasm: Secondary | ICD-10-CM

## 2011-12-29 DIAGNOSIS — R5383 Other fatigue: Secondary | ICD-10-CM

## 2011-12-29 DIAGNOSIS — F419 Anxiety disorder, unspecified: Secondary | ICD-10-CM

## 2011-12-29 DIAGNOSIS — IMO0001 Reserved for inherently not codable concepts without codable children: Secondary | ICD-10-CM

## 2011-12-29 DIAGNOSIS — M797 Fibromyalgia: Secondary | ICD-10-CM

## 2011-12-29 DIAGNOSIS — G4710 Hypersomnia, unspecified: Secondary | ICD-10-CM

## 2011-12-29 DIAGNOSIS — D519 Vitamin B12 deficiency anemia, unspecified: Secondary | ICD-10-CM

## 2011-12-29 DIAGNOSIS — F341 Dysthymic disorder: Secondary | ICD-10-CM

## 2011-12-29 DIAGNOSIS — D518 Other vitamin B12 deficiency anemias: Secondary | ICD-10-CM

## 2011-12-29 DIAGNOSIS — E538 Deficiency of other specified B group vitamins: Secondary | ICD-10-CM

## 2011-12-29 MED ORDER — BUSPIRONE HCL 10 MG PO TABS: 10.0000 mg | ORAL_TABLET | Freq: Three times a day (TID) | ORAL | Status: DC

## 2011-12-29 MED ORDER — FA-VITAMIN B-6-VITAMIN B-12 2.2-25-0.5 MG PO TABS: 1.0000 | ORAL_TABLET | Freq: Two times a day (BID) | ORAL | Status: DC

## 2011-12-29 MED ORDER — BACLOFEN 10 MG PO TABS: 10.0000 mg | ORAL_TABLET | Freq: Three times a day (TID) | ORAL | Status: DC

## 2011-12-29 MED ORDER — ARMODAFINIL 150 MG PO TABS: 150.0000 mg | ORAL_TABLET | Freq: Every day | ORAL | Status: DC

## 2011-12-29 NOTE — Progress Notes (Signed)
Subjective:    Patient ID: Kristin Pope, female    DOB: [birth year], 56 y.o.   MRN: [id number]  HPI  Changed the  Medications for the depression to the Cymbalta with poor control of the depression and the fibromyalgia pain. The pts muscle relaxant has been changed to Zanaflex which is not on the preferred list The pt's alprazolam was changed to the diazepam which is not on the medicaid list. The pt has been on the Zanaflex.Marland Kitchen and we changed to the parafon but it is not on the formulary and she has been out of this class of medications Has been  on diazepam without muscle relaxants  She has been on providgil in the past for excessive daytime somnolence. She sleeps 18 hours a day.  Review of Systems  Constitutional: Positive for fatigue. Negative for activity change and appetite change.  HENT: Negative for ear pain, congestion, neck pain, postnasal drip and sinus pressure.   Eyes: Negative for redness and visual disturbance.  Respiratory: Negative for cough, shortness of breath and wheezing.   Gastrointestinal: Negative for abdominal pain and abdominal distention.  Genitourinary: Negative for dysuria, frequency and menstrual problem.  Musculoskeletal: Positive for myalgias, back pain and arthralgias. Negative for joint swelling.  Skin: Negative for rash and wound.  Neurological: Positive for weakness. Negative for dizziness and headaches.  Hematological: Negative for adenopathy. Does not bruise/bleed easily.  Psychiatric/Behavioral: Positive for decreased concentration. Negative for sleep disturbance.   Past Medical History  Diagnosis Date  . Allergy   . Depression   . Fibromyalgia   . Hypertension   . Hyperlipidemia   . Low back pain     History   Social History  . Marital Status: Divorced    Spouse Name: N/A    Number of Children: N/A  . Years of Education: N/A   Occupational History  . retired    Social History Main Topics  . Smoking status: Former Smoker    Quit  date: 09/29/1984  . Smokeless tobacco: Not on file  . Alcohol Use: No  . Drug Use: No  . Sexually Active: Not Currently   Other Topics Concern  . Not on file   Social History Narrative  . No narrative on file    Past Surgical History  Procedure Date  . Hysteroscopy 01/2011    Family History  Problem Relation Age of Onset  . Coronary artery disease Brother   . Heart disease Mother   . Heart attack Mother   . Breast cancer Sister   . Lung cancer Sister   . Breast cancer Maternal Aunt   . Breast cancer Maternal Grandmother   . Stroke Maternal Grandfather   . Brain cancer Paternal Grandfather   . Asthma Mother   . Lung cancer Paternal Grandfather   . Rheum arthritis Maternal Grandmother     Allergies  Allergen Reactions  . Codeine     REACTION: Insomnia  . Hydrocodone-Acetaminophen     REACTION: Nausea Vomiting Fluid retention  . Tetracycline     REACTION: Nausea Vomiting    Current Outpatient Prescriptions on File Prior to Visit  Medication Sig Dispense Refill  . azelastine (ASTELIN) 137 MCG/SPRAY nasal spray Place 1 spray into the nose 2 (two) times daily as needed. Use in each nostril as directed      . budesonide-formoterol (SYMBICORT) 160-4.5 MCG/ACT inhaler Inhale 2 puffs into the lungs 2 (two) times daily as needed.       . cetirizine (ZYRTEC) 10  MG tablet Take 10 mg by mouth daily as needed.       . DULoxetine (CYMBALTA) 60 MG capsule Take 1 capsule (60 mg total) by mouth daily.  30 capsule  2  . estazolam (PROSOM) 2 MG tablet Take 1 tablet (2 mg total) by mouth at bedtime.  30 tablet  3  . fexofenadine (ALLEGRA) 180 MG tablet Take 180 mg by mouth daily as needed.       . furosemide (LASIX) 40 MG tablet 1/2 daily prn       . glucose blood (FREESTYLE LITE) test strip 1 each by Other route as needed. Use as instructed       . lactase (LACTAID) 3000 UNITS tablet Take 1 tablet by mouth daily as needed.        Marland Kitchen levocetirizine (XYZAL) 5 MG tablet Take 5 mg by  mouth daily.        Marland Kitchen loperamide (IMODIUM) 2 MG capsule Take 2 mg by mouth as needed.        . loratadine (CLARITIN) 10 MG tablet Take 10 mg by mouth daily as needed.       . lubiprostone (AMITIZA) 24 MCG capsule Take 24 mcg by mouth 2 (two) times daily as needed.        . montelukast (SINGULAIR) 10 MG tablet Take 10 mg by mouth daily as needed.       . nadolol (CORGARD) 40 MG tablet Take 1 tablet (40 mg total) by mouth daily.  90 tablet  3  . nitrofurantoin (MACRODANTIN) 100 MG capsule Take 1 capsule (100 mg total) by mouth at bedtime.  90 capsule  1  . omeprazole (PRILOSEC) 20 MG capsule Take 1 capsule (20 mg total) by mouth daily.  90 capsule  3  . potassium chloride (KLOR-CON) 8 MEQ tablet Take 1 tablet (8 mEq total) by mouth daily.  90 tablet  1  . ranitidine (ZANTAC) 150 MG tablet Take 150 mg by mouth daily as needed.        Marland Kitchen DISCONTD: estazolam (PROSOM) 2 MG tablet Take 1 tablet (2 mg total) by mouth at bedtime.  30 tablet  3  . amphetamine-dextroamphetamine (ADDERALL, 20MG ,) 20 MG tablet Take 1 tablet (20 mg total) by mouth daily.  30 tablet  0  . L-Methylfolate (DEPLIN) 7.5 MG TABS Take 1 tablet (7.5 mg total) by mouth daily.  30 each  6   Current Facility-Administered Medications on File Prior to Visit  Medication Dose Route Frequency Provider Last Rate Last Dose  . cyanocobalamin ((VITAMIN B-12)) injection 1,000 mcg  1,000 mcg Intramuscular Once Stacie Glaze, MD      . cyanocobalamin ((VITAMIN B-12)) injection 1,000 mcg  1,000 mcg Intramuscular Q30 days Stacie Glaze, MD   1,000 mcg at 09/10/11 1412    BP 154/90  Pulse 72  Temp 98.3 F (36.8 C)  Resp 16  Ht 4\' 11"  (1.499 m)  Wt 171 lb (77.565 kg)  BMI 34.54 kg/m2       Objective:   Physical Exam  Nursing note and vitals reviewed. Constitutional: She appears well-developed and well-nourished.  Eyes: Conjunctivae are normal. Pupils are equal, round, and reactive to light.  Musculoskeletal: Normal range of motion. She  exhibits edema and tenderness.          Assessment & Plan:  Excessive daytime somnolence. We will try nuvidgil  150 mg by mouth daily as a trial and continue the Cymbalta on the current dose schedule.  For anxiety we will use BuSpar We will avoid the use of the anxiety Olympics such as Xanax and Valium due to the exudative capacity.  For muscle relaxants we will use baclofen  Her current stage of fibromyalgia is moderate to severe with severe functional limitations due to the hypersomnolence that is complicated by her medication regime therefore we have made significant changes in her meds today

## 2011-12-29 NOTE — Patient Instructions (Addendum)
We will try individual for the daytime somnolence this replaces Adderall We will replace all muscle relaxants with baclofen We will replace all antianxiety medications With BuSpar  We will continue on the Cymbalta for now with samples

## 2011-12-30 MED ORDER — CYANOCOBALAMIN 1000 MCG/ML IJ SOLN: 1000.0000 ug | INTRAMUSCULAR | Status: AC

## 2011-12-30 NOTE — Progress Notes (Signed)
Addended by: Willy Eddy on: 12/30/2011 11:03 AM   Modules accepted: Orders

## 2012-01-05 ENCOUNTER — Telehealth: Payer: Self-pay | Admitting: Family Medicine

## 2012-01-05 NOTE — Telephone Encounter (Signed)
Tim [name], this pt just went on Medicaid. Used to be on [facility]. Not sure why girls at front did not catch this. Also not sure how long we've been seeing her with Medicaid. What shall we do now? It's not [place] [facility], but I don't show that she has any other type of insurance at all. Thanks!

## 2012-01-05 NOTE — Telephone Encounter (Signed)
Tried to talk with pt and her cell phone was not functioning properly- pt instructetd to call tomorrow- dont know who ordered injection

## 2012-01-05 NOTE — Telephone Encounter (Signed)
Pulled from Triage vmail. Patient has appt tomorrow for spinal injections. Medicaid will not pay without a referral. Please enter a referral. Also, pt has multiple questions about her Diazepam, and other meds. Please call. Thanks.

## 2012-01-06 ENCOUNTER — Telehealth: Payer: Self-pay | Admitting: *Deleted

## 2012-01-06 NOTE — Telephone Encounter (Signed)
Stop baclofen until Thursday but stay on buspar- call us Thursday and let us know if it helped

## 2012-01-06 NOTE — Telephone Encounter (Signed)
Bonnye, please call the pt back.

## 2012-01-06 NOTE — Telephone Encounter (Signed)
Pt informed

## 2012-01-06 NOTE — Telephone Encounter (Signed)
Pt states she was started on baclofen and buspar on 4-1--since then she states she is confused and has lost days and weeks- she states she discontinued it and sx are better, but now she is asking what she can take in itsplace

## 2012-01-08 ENCOUNTER — Telehealth: Payer: Self-pay | Admitting: *Deleted

## 2012-01-08 MED ORDER — TIZANIDINE HCL 2 MG PO CAPS: 2.0000 mg | ORAL_CAPSULE | Freq: Two times a day (BID) | ORAL | Status: DC

## 2012-01-08 NOTE — Telephone Encounter (Signed)
Pt informed--will [facility] to [facility] [place]

## 2012-01-08 NOTE — Telephone Encounter (Signed)
Pt called today and states she feels better since stopping the baclofen ,but now needs another muscle relaxer- per dr Lovell Sheehan may have tizanidine2 bid

## 2012-01-14 ENCOUNTER — Ambulatory Visit: Payer: 59 | Admitting: Internal Medicine

## 2012-01-20 ENCOUNTER — Encounter: Payer: Self-pay | Admitting: Internal Medicine

## 2012-01-20 ENCOUNTER — Ambulatory Visit (INDEPENDENT_AMBULATORY_CARE_PROVIDER_SITE_OTHER): Payer: Medicaid Other | Admitting: Internal Medicine

## 2012-01-20 VITALS — BP 140/80 | HR 72 | Temp 98.3°F | Resp 16 | Ht 59.0 in | Wt 172.0 lb

## 2012-01-20 DIAGNOSIS — E785 Hyperlipidemia, unspecified: Secondary | ICD-10-CM

## 2012-01-20 DIAGNOSIS — M62838 Other muscle spasm: Secondary | ICD-10-CM

## 2012-01-20 DIAGNOSIS — F341 Dysthymic disorder: Secondary | ICD-10-CM

## 2012-01-20 DIAGNOSIS — F329 Major depressive disorder, single episode, unspecified: Secondary | ICD-10-CM

## 2012-01-20 DIAGNOSIS — K589 Irritable bowel syndrome without diarrhea: Secondary | ICD-10-CM

## 2012-01-20 MED ORDER — OMEGA-3 FATTY ACIDS 1000 MG PO CAPS: 2.0000 g | ORAL_CAPSULE | Freq: Every day | ORAL | Status: AC

## 2012-01-20 MED ORDER — ESTAZOLAM 2 MG PO TABS: 2.0000 mg | ORAL_TABLET | Freq: Every day | ORAL | Status: DC

## 2012-01-20 MED ORDER — DULOXETINE HCL 60 MG PO CPEP: 60.0000 mg | ORAL_CAPSULE | Freq: Every day | ORAL | Status: DC

## 2012-01-20 MED ORDER — LUBIPROSTONE 24 MCG PO CAPS: 24.0000 ug | ORAL_CAPSULE | Freq: Every day | ORAL | Status: DC

## 2012-01-20 MED ORDER — BUSPIRONE HCL 15 MG PO TABS: 15.0000 mg | ORAL_TABLET | Freq: Three times a day (TID) | ORAL | Status: AC

## 2012-01-20 MED ORDER — TIZANIDINE HCL 4 MG PO TABS: 4.0000 mg | ORAL_TABLET | Freq: Three times a day (TID) | ORAL | Status: DC | PRN

## 2012-01-20 NOTE — Progress Notes (Signed)
Subjective:    Patient ID: Kristin Pope, female    DOB: 02-09-1956, 56 y.o.   MRN: [id number]  HPI Very concerned about transfer to another clinic due to her insurance change. Patient has recently been approved for Medicaid and will require her to temporarily changed clinics.  She is followed by office for multiple complex interactions and complaints including fibromyalgia hypertension hyperlipidemia depression and chronic pain.  We will assist her in her medication management and told to affect a transfer can occur and make sure that her records get sent to the office without delay.  We will always remain available for consultation services and when she becomes too old she will which certainly would offer our services again if desired.  Her active medical problems include 1 fibromyalgia with pain daytime hypersomnolence.  She is on a combination of medications for this including a muscle relaxants Cymbalta BuSpar.  2 irritable bowel syndrome for which she is more constipation prone and she is on amitiza and Imodium.  3 anxiety with depression for which she takes Cymbalta and BuSpar  4. insomnia for which she takes ProSom which is also part of the fibromyalgia syndrome  5 hypertension for which she takes Lasix and Corgard  6 chronic GERD for which she takes Prilosec alternating with Zantac 7 asthmatic bronchitis and chronic allergies for which she takes Singulair and uses a when necessary inhaler as well as Claritin. 8 degenerative disc disease for which she sees Dr. Ethelene Hal and Dr.  Shon Baton at [facility]    Review of Systems  Constitutional: Positive for fatigue. Negative for activity change and appetite change.  HENT: Negative for ear pain, congestion, neck pain, postnasal drip and sinus pressure.   Eyes: Negative for redness and visual disturbance.  Respiratory: Negative for cough, shortness of breath and wheezing.   Gastrointestinal: Negative for abdominal pain and  abdominal distention.  Genitourinary: Negative for dysuria, frequency and menstrual problem.  Musculoskeletal: Positive for myalgias, back pain and arthralgias. Negative for joint swelling.  Skin: Negative for rash and wound.  Neurological: Positive for weakness. Negative for dizziness and headaches.  Hematological: Negative for adenopathy. Does not bruise/bleed easily.  Psychiatric/Behavioral: Positive for decreased concentration. Negative for sleep disturbance.   Past Medical History  Diagnosis Date  . Allergy   . Depression   . Fibromyalgia   . Hypertension   . Hyperlipidemia   . Low back pain     History   Social History  . Marital Status: Divorced    Spouse Name: N/A    Number of Children: N/A  . Years of Education: N/A   Occupational History  . retired    Social History Main Topics  . Smoking status: Former Smoker    Quit date: 09/29/1984  . Smokeless tobacco: Not on file  . Alcohol Use: No  . Drug Use: No  . Sexually Active: Not Currently   Other Topics Concern  . Not on file   Social History Narrative  . No narrative on file    Past Surgical History  Procedure Date  . Hysteroscopy 01/2011    Family History  Problem Relation Age of Onset  . Coronary artery disease Brother   . Heart disease Mother   . Heart attack Mother   . Breast cancer Sister   . Lung cancer Sister   . Breast cancer Maternal Aunt   . Breast cancer Maternal Grandmother   . Stroke Maternal Grandfather   . Brain cancer Paternal Grandfather   .  Asthma Mother   . Lung cancer Paternal Grandfather   . Rheum arthritis Maternal Grandmother     Allergies  Allergen Reactions  . Codeine     REACTION: Insomnia  . Hydrocodone-Acetaminophen     REACTION: Nausea Vomiting Fluid retention  . Tetracycline     REACTION: Nausea Vomiting    Current Outpatient Prescriptions on File Prior to Visit  Medication Sig Dispense Refill  . Armodafinil 150 MG tablet Take 1 tablet (150 mg total)  by mouth daily.  30 tablet  5  . azelastine (ASTELIN) 137 MCG/SPRAY nasal spray Place 1 spray into the nose 2 (two) times daily as needed. Use in each nostril as directed      . budesonide-formoterol (SYMBICORT) 160-4.5 MCG/ACT inhaler Inhale 2 puffs into the lungs 2 (two) times daily as needed.       . cetirizine (ZYRTEC) 10 MG tablet Take 10 mg by mouth daily as needed.       . DULoxetine (CYMBALTA) 60 MG capsule Take 1 capsule (60 mg total) by mouth daily.  30 capsule  2  . estazolam (PROSOM) 2 MG tablet Take 1 tablet (2 mg total) by mouth at bedtime.  30 tablet  3  . fexofenadine (ALLEGRA) 180 MG tablet Take 180 mg by mouth daily as needed.       . Folic Acid-Vit B6-Vit B12 (FA-VITAMIN B-6-VITAMIN B-12) 2.2-25-0.5 MG TABS Take 1 tablet by mouth 2 (two) times daily.  60 each  11  . furosemide (LASIX) 40 MG tablet 1/2 daily prn       . glucose blood (FREESTYLE LITE) test strip 1 each by Other route as needed. Use as instructed       . lactase (LACTAID) 3000 UNITS tablet Take 1 tablet by mouth daily as needed.        . loperamide (IMODIUM) 2 MG capsule Take 2 mg by mouth as needed.        . loratadine (CLARITIN) 10 MG tablet Take 10 mg by mouth daily as needed.       . lubiprostone (AMITIZA) 24 MCG capsule Take 24 mcg by mouth 2 (two) times daily as needed.        . montelukast (SINGULAIR) 10 MG tablet Take 10 mg by mouth daily as needed.       . nadolol (CORGARD) 40 MG tablet Take 1 tablet (40 mg total) by mouth daily.  90 tablet  3  . nitrofurantoin (MACRODANTIN) 100 MG capsule Take 1 capsule (100 mg total) by mouth at bedtime.  90 capsule  1  . omeprazole (PRILOSEC) 20 MG capsule Take 1 capsule (20 mg total) by mouth daily.  90 capsule  3  . potassium chloride (KLOR-CON) 8 MEQ tablet Take 1 tablet (8 mEq total) by mouth daily.  90 tablet  1  . ranitidine (ZANTAC) 150 MG tablet Take 150 mg by mouth daily as needed.         Current Facility-Administered Medications on File Prior to Visit    Medication Dose Route Frequency Provider Last Rate Last Dose  . cyanocobalamin ((VITAMIN B-12)) injection 1,000 mcg  1,000 mcg Intramuscular Once Stacie Glaze, MD      . cyanocobalamin ((VITAMIN B-12)) injection 1,000 mcg  1,000 mcg Intramuscular Q30 days Stacie Glaze, MD   1,000 mcg at 09/10/11 1412    BP 140/80  Pulse 72  Temp 98.3 F (36.8 C)  Resp 16  Ht 4\' 11"  (1.499 m)  Wt 172 lb (  78.019 kg)  BMI 34.74 kg/m2       Objective:   Physical Exam  Nursing note and vitals reviewed. Constitutional: She appears well-developed and well-nourished. No distress.  HENT:  Head: Normocephalic and atraumatic.  Right Ear: External ear normal.  Left Ear: External ear normal.  Nose: Nose normal.  Mouth/Throat: Oropharynx is clear and moist.  Eyes: Conjunctivae and EOM are normal. Pupils are equal, round, and reactive to light.  Neck: Normal range of motion. Neck supple. No JVD present. No tracheal deviation present. No thyromegaly present.  Cardiovascular: Normal rate and regular rhythm.   Murmur heard. Pulmonary/Chest: Effort normal and breath sounds normal. She has no wheezes. She exhibits no tenderness.  Abdominal: Soft. Bowel sounds are normal.  Musculoskeletal: She exhibits no edema and no tenderness.  Lymphadenopathy:    She has no cervical adenopathy.  Neurological: She has normal reflexes. No cranial nerve deficit.  Skin: She is not diaphoretic.  Psychiatric: She has a normal mood and affect.          Assessment & Plan:  Reviewed all prescriptions and gave necessary prescriptions Prepared  record for transition of care We reviewed the blood work from her last office visit with her and discussed appropriate interventions.  Her A1c was normal at 5.6 therefore she does not have diabetes but I still urged her to work on weight reduction and exercise. Her lipids were moderately well controlled with an elevated HDL and an LDL C. of 110 we'll recommend omega 3  supplementation or fish oil 2 g daily this also might help with joint pain.  Blood pressure stable on current medications  samples of medications given

## 2012-01-20 NOTE — Patient Instructions (Signed)
The patient is instructed to continue all medications as prescribed. Schedule followup with check out clerk upon leaving the clinic  

## 2012-02-18 ENCOUNTER — Other Ambulatory Visit: Payer: Self-pay | Admitting: Internal Medicine

## 2012-03-03 ENCOUNTER — Ambulatory Visit: Payer: Medicaid Other | Admitting: Internal Medicine

## 2012-04-20 ENCOUNTER — Encounter (HOSPITAL_COMMUNITY): Payer: Self-pay | Admitting: Emergency Medicine

## 2012-04-20 ENCOUNTER — Emergency Department (HOSPITAL_COMMUNITY)
Admission: EM | Admit: 2012-04-20 | Discharge: 2012-04-20 | Disposition: A | Discharge status: Home / Self Care | Payer: Self-pay | Source: Home / Self Care | Attending: Emergency Medicine | Admitting: Emergency Medicine

## 2012-04-20 DIAGNOSIS — N390 Urinary tract infection, site not specified: Secondary | ICD-10-CM

## 2012-04-20 DIAGNOSIS — B9689 Other specified bacterial agents as the cause of diseases classified elsewhere: Secondary | ICD-10-CM

## 2012-04-20 DIAGNOSIS — A499 Bacterial infection, unspecified: Secondary | ICD-10-CM

## 2012-04-20 DIAGNOSIS — N760 Acute vaginitis: Secondary | ICD-10-CM

## 2012-04-20 LAB — POCT URINALYSIS DIP (DEVICE)
Ketones, ur: NEGATIVE mg/dL
Leukocytes, UA: NEGATIVE
Nitrite: NEGATIVE

## 2012-04-20 LAB — WET PREP, GENITAL
Clue Cells Wet Prep HPF POC: NONE SEEN
Trich, Wet Prep: NONE SEEN
WBC, Wet Prep HPF POC: NONE SEEN

## 2012-04-20 MED ORDER — FLUCONAZOLE 150 MG PO TABS: 150.0000 mg | ORAL_TABLET | Freq: Once | ORAL | Status: AC

## 2012-04-20 MED ORDER — CIPROFLOXACIN HCL 500 MG PO TABS: 500.0000 mg | ORAL_TABLET | Freq: Two times a day (BID) | ORAL | Status: AC

## 2012-04-20 MED ORDER — METRONIDAZOLE 500 MG PO TABS: 500.0000 mg | ORAL_TABLET | Freq: Two times a day (BID) | ORAL | Status: AC

## 2012-04-20 NOTE — ED Notes (Signed)
UTI syx, vaginal d/c for some time

## 2012-04-20 NOTE — ED Provider Notes (Signed)
Chief Complaint  Patient presents with  . Urinary Tract Infection    History of Present Illness:   Kristin Pope is a 56 year old female who has multiple, chronic medical problems and is on a host of medications. She's had chronic vaginal discharge but denies any odor or itching. She does have some aching in her pelvic area. She also has a history of cervical polyps and uterine polyps. She is postmenopausal. She also has had a history of frequent recurring urinary tract infections and she is on Macrobid daily for prophylaxis. For the past week she's had some urgency, bladder pain and pressure, and may have seen some blood in her urine. She's felt feverish but had no nausea or vomiting.  Review of Systems:  Other than noted above, the patient denies any of the following symptoms: General:  No fevers, chills, sweats, aches, or fatigue. GI:  No abdominal pain, back pain, nausea, vomiting, diarrhea, or constipation. GU:  No dysuria, frequency, urgency, hematuria, or incontinence. GYN:  No discharge, itching, vulvar pain or lesions, pelvic pain, or abnormal vaginal bleeding.  PMFSH:  Past medical history, family history, social history, meds, and allergies were reviewed.  Physical Exam:   Vital signs:  BP 156/94  Pulse 68  Temp 98.1 F (36.7 C) (Oral)  Resp 18  SpO2 95% Gen:  Alert, oriented, in no distress. Lungs:  Clear to auscultation, no wheezes, rales or rhonchi. Heart:  Regular rhythm, no gallop or murmer. Abdomen:  Flat and soft. There was slight generalized pain to palpation. No localized pain to palpation. No guarding, or rebound.  No hepato-splenomegaly or mass.  Bowel sounds were normally active.  No hernia. Pelvic exam:  External genitalia were unremarkable. Vaginal and cervical mucosa were atrophic. There was a moderate amount of whitish malodorous discharge. No cervical motion pain. Uterus was midposition normal in size and shape and nontender. No adnexal masses or tenderness. Back:  No  CVA tenderness.  Skin:  Clear, warm and dry.  Labs:   Results for orders placed during the hospital encounter of 04/20/12  POCT URINALYSIS DIP (DEVICE)      Component Value Range   Glucose, UA NEGATIVE  NEGATIVE mg/dL   Bilirubin Urine SMALL (*) NEGATIVE   Ketones, ur NEGATIVE  NEGATIVE mg/dL   Specific Gravity, Urine 1.025  1.005 - 1.030   Hgb urine dipstick SMALL (*) NEGATIVE   pH 5.5  5.0 - 8.0   Protein, ur >=300 (*) NEGATIVE mg/dL   Urobilinogen, UA 0.2  0.0 - 1.0 mg/dL   Nitrite NEGATIVE  NEGATIVE   Leukocytes, UA NEGATIVE  NEGATIVE     Other Labs Obtained at Urgent Care Center:  A urine culture was obtained.  GC and Chlamydia DNA probes were obtained as well as a wet prep. Results are pending at this time and we will call about any positive results.  Assessment: The primary encounter diagnosis was UTI (lower urinary tract infection). A diagnosis of Bacterial vaginosis was also pertinent to this visit.   Plan:   1.  The following meds were prescribed:   New Prescriptions   CIPROFLOXACIN (CIPRO) 500 MG TABLET    Take 1 tablet (500 mg total) by mouth every 12 (twelve) hours.   FLUCONAZOLE (DIFLUCAN) 150 MG TABLET    Take 1 tablet (150 mg total) by mouth once.   METRONIDAZOLE (FLAGYL) 500 MG TABLET    Take 1 tablet (500 mg total) by mouth 2 (two) times daily.   2.  The patient was  instructed in symptomatic care and handouts were given. 3.  The patient was told to return if becoming worse in any way, if no better in 3 or 4 days, and given some red flag symptoms that would indicate earlier return. 4.  The patient was told to avoid intercourse for 10 days, get extra fluids, and return for a follow up with her primary care doctor at the completion of treatment for a repeat UA and culture.     Reuben Likes, MD 04/20/12 [id number]

## 2012-04-21 LAB — GC/CHLAMYDIA PROBE AMP, GENITAL
Chlamydia, DNA Probe: NEGATIVE
GC Probe Amp, Genital: NEGATIVE

## 2012-04-21 LAB — URINE CULTURE: Colony Count: NO GROWTH

## 2012-05-04 ENCOUNTER — Telehealth (HOSPITAL_COMMUNITY): Payer: Self-pay | Admitting: *Deleted

## 2012-05-04 NOTE — ED Notes (Signed)
Pt. called and said she needs a refill of her medication because she has a suppressed immune system.  States she has an appointment with her PCP on 8/12 and her OB-GYN on 8/14 but is c/o vaginal discharge and urgency.  I told her I would call back.  Discussed with Dr. Lorenz Coaster and her said OK to refill Metronidazole 500 mg. 1 po BID x 7 days # 14- no refills but not the Cipro without being rechecked.  I called pt. and gave her this information. Pt. Instructed to drink plenty of water and 1 glass of cranberry juice/day.  Pt. wants Rx. called to [facility] on [address]  Rx. called to pharmacist @ [phone]. Vassie Moselle 05/04/2012

## 2012-05-14 ENCOUNTER — Other Ambulatory Visit: Payer: Self-pay | Admitting: Obstetrics and Gynecology

## 2012-06-29 ENCOUNTER — Other Ambulatory Visit: Payer: Self-pay | Admitting: Internal Medicine

## 2012-07-15 ENCOUNTER — Other Ambulatory Visit: Payer: Self-pay | Admitting: Internal Medicine

## 2012-12-17 ENCOUNTER — Other Ambulatory Visit: Payer: Self-pay | Admitting: Internal Medicine

## 2013-03-02 ENCOUNTER — Other Ambulatory Visit: Payer: Self-pay | Admitting: Obstetrics and Gynecology

## 2013-04-04 ENCOUNTER — Other Ambulatory Visit: Payer: Self-pay | Admitting: Internal Medicine

## 2015-07-02 NOTE — Patient Instructions (Addendum)
Kristin Pope  07/02/2015   Your procedure is scheduled on: Tuesday 07/17/2015  Report to [facility] Main  Entrance take [place]  elevators to 3rd floor to  [place] at  0830 AM.  Call this number if you have problems the morning of surgery [phone]   Remember: ONLY 1 PERSON MAY GO WITH YOU TO SHORT STAY TO GET  READY MORNING OF [place].  Do not eat food or drink liquids :After Midnight.     Take these medicines the morning of surgery with A SIP OF WATER: NADOLOL ([id number]), CYMBALTA, PRILOSEC, USE SYMBICORT  INHALER IF NEEDED, USE ASTELIN NASAL SPRAY IF NEEDED,AND USE SINGULAIR  AND BRING INHALERS WITH YOU TO HOSPITAL MORNING OF SURGERY! And Clonazepam  IF YOU USE CPAP, BRING YOUR MASK AND TUBING WITH YOU MORNING OF SURGERY!   DO NOT TAKE ANY DIABETIC MEDICATIONS DAY OF YOUR SURGERY                               You may not have any metal on your body including hair pins and              piercings  Do not wear jewelry, make-up, lotions, powders or perfumes, deodorant             Do not wear nail polish.  Do not shave  48 hours prior to surgery.              Men may shave face and neck.   Do not bring valuables to the hospital. [place].  Contacts, dentures or bridgework may not be worn into surgery.  Leave suitcase in the car. After surgery it may be brought to your room.     Patients discharged the day of surgery will not be allowed to drive home.  Name and phone number of your driver:  Special Instructions: N/A              Please read over the following fact sheets you were given: _____________________________________________________________________             [facility] - Preparing for Surgery Before surgery, you can play an important role.  Because skin is not sterile, your skin needs to be as free of germs as possible.  You can reduce the number of germs on your skin by washing  with CHG (chlorahexidine gluconate) soap before surgery.  CHG is an antiseptic cleaner which kills germs and bonds with the skin to continue killing germs even after washing. Please DO NOT use if you have an allergy to CHG or antibacterial soaps.  If your skin becomes reddened/irritated stop using the CHG and inform your nurse when you arrive at Short Stay. Do not shave (including legs and underarms) for at least 48 hours prior to the first CHG shower.  You may shave your face/neck. Please follow these instructions carefully:  1.  Shower with CHG Soap the night before surgery and the  morning of Surgery.  2.  If you choose to wash your hair, wash your hair first as usual with your  normal  shampoo.  3.  After you shampoo, rinse your hair and body thoroughly to remove the  shampoo.  4.  Use CHG as you would any other liquid soap.  You can apply chg directly  to the skin and wash                       Gently with a scrungie or clean washcloth.  5.  Apply the CHG Soap to your body ONLY FROM THE NECK DOWN.   Do not use on face/ open                           Wound or open sores. Avoid contact with eyes, ears mouth and genitals (private parts).                       Wash face,  Genitals (private parts) with your normal soap.             6.  Wash thoroughly, paying special attention to the area where your surgery  will be performed.  7.  Thoroughly rinse your body with warm water from the neck down.  8.  DO NOT shower/wash with your normal soap after using and rinsing off  the CHG Soap.                9.  Pat yourself dry with a clean towel.            10.  Wear clean pajamas.            11.  Place clean sheets on your bed the night of your first shower and do not  sleep with pets. Day of Surgery : Do not apply any lotions/deodorants the morning of surgery.  Please wear clean clothes to the hospital/surgery center.  FAILURE TO FOLLOW THESE INSTRUCTIONS MAY RESULT IN THE  CANCELLATION OF YOUR SURGERY PATIENT SIGNATURE_________________________________  NURSE SIGNATURE__________________________________  ________________________________________________________________________   Kristin Pope  An incentive spirometer is a tool that can help keep your lungs clear and active. This tool measures how well you are filling your lungs with each breath. Taking long deep breaths may help reverse or decrease the chance of developing breathing (pulmonary) problems (especially infection) following:  A long period of time when you are unable to move or be active. BEFORE THE PROCEDURE   If the spirometer includes an indicator to show your best effort, your nurse or respiratory therapist will set it to a desired goal.  If possible, sit up straight or lean slightly forward. Try not to slouch.  Hold the incentive spirometer in an upright position. INSTRUCTIONS FOR USE   Sit on the edge of your bed if possible, or sit up as far as you can in bed or on a chair.  Hold the incentive spirometer in an upright position.  Breathe out normally.  Place the mouthpiece in your mouth and seal your lips tightly around it.  Breathe in slowly and as deeply as possible, raising the piston or the ball toward the top of the column.  Hold your breath for 3-5 seconds or for as long as possible. Allow the piston or ball to fall to the bottom of the column.  Remove the mouthpiece from your mouth and breathe out normally.  Rest for a few seconds and repeat Steps 1 through 7 at least 10 times every 1-2 hours when you are awake. Take your time and take a few normal breaths between deep breaths.  The spirometer may include an indicator to show  your best effort. Use the indicator as a goal to work toward during each repetition.  After each set of 10 deep breaths, practice coughing to be sure your lungs are clear. If you have an incision (the cut made at the time of surgery), support  your incision when coughing by placing a pillow or rolled up towels firmly against it. Once you are able to get out of bed, walk around indoors and cough well. You may stop using the incentive spirometer when instructed by your caregiver.  RISKS AND COMPLICATIONS  Take your time so you do not get dizzy or light-headed.  If you are in pain, you may need to take or ask for pain medication before doing incentive spirometry. It is harder to take a deep breath if you are having pain. AFTER USE  Rest and breathe slowly and easily.  It can be helpful to keep track of a log of your progress. Your caregiver can provide you with a simple table to help with this. If you are using the spirometer at home, follow these instructions: [place] IF:   You are having difficultly using the spirometer.  You have trouble using the spirometer as often as instructed.  Your pain medication is not giving enough relief while using the spirometer.  You develop fever of 100.5 F (38.1 C) or higher. SEEK IMMEDIATE MEDICAL CARE IF:   You cough up bloody sputum that had not been present before.  You develop fever of 102 F (38.9 C) or greater.  You develop worsening pain at or near the incision site. MAKE SURE YOU:   Understand these instructions.  Will watch your condition.  Will get help right away if you are not doing well or get worse. Document Released: 01/26/2007 Document Revised: 12/08/2011 Document Reviewed: 03/29/2007 Exit[employer] Patient Information 2014 [employer], [place].   ________________________________________________________________________  WHAT IS A BLOOD TRANSFUSION? Blood Transfusion Information  A transfusion is the replacement of blood or some of its parts. Blood is made up of multiple cells which provide different functions.  Red blood cells carry oxygen and are used for blood loss replacement.  White blood cells fight against infection.  Platelets control  bleeding.  Plasma helps clot blood.  Other blood products are available for specialized needs, such as hemophilia or other clotting disorders. BEFORE THE TRANSFUSION  Who gives blood for transfusions?   Healthy volunteers who are fully evaluated to make sure their blood is safe. This is blood bank blood. Transfusion therapy is the safest it has ever been in the practice of medicine. Before blood is taken from a donor, a complete history is taken to make sure that person has no history of diseases nor engages in risky social behavior (examples are intravenous drug use or sexual activity with multiple partners). The donor's travel history is screened to minimize risk of transmitting infections, such as malaria. The donated blood is tested for signs of infectious diseases, such as HIV and hepatitis. The blood is then tested to be sure it is compatible with you in order to minimize the chance of a transfusion reaction. If you or a relative donates blood, this is often done in anticipation of surgery and is not appropriate for emergency situations. It takes many days to process the donated blood. RISKS AND COMPLICATIONS Although transfusion therapy is very safe and saves many lives, the main dangers of transfusion include:   Getting an infectious disease.  Developing a transfusion reaction. This is an allergic reaction to  something in the blood you were given. Every precaution is taken to prevent this. The decision to have a blood transfusion has been considered carefully by your caregiver before blood is given. Blood is not given unless the benefits outweigh the risks. AFTER THE TRANSFUSION  Right after receiving a blood transfusion, you will usually feel much better and more energetic. This is especially true if your red blood cells have gotten low (anemic). The transfusion raises the level of the red blood cells which carry oxygen, and this usually causes an energy increase.  The nurse  administering the transfusion will monitor you carefully for complications. HOME CARE INSTRUCTIONS  No special instructions are needed after a transfusion. You may find your energy is better. Speak with your caregiver about any limitations on activity for underlying diseases you may have. SEEK MEDICAL CARE IF:   Your condition is not improving after your transfusion.  You develop redness or irritation at the intravenous (IV) site. SEEK IMMEDIATE MEDICAL CARE IF:  Any of the following symptoms occur over the next 12 hours:  Shaking chills.  You have a temperature by mouth above 102 F (38.9 C), not controlled by medicine.  Chest, back, or muscle pain.  People around you feel you are not acting correctly or are confused.  Shortness of breath or difficulty breathing.  Dizziness and fainting.  You get a rash or develop hives.  You have a decrease in urine output.  Your urine turns a dark color or changes to pink, red, or brown. Any of the following symptoms occur over the next 10 days:  You have a temperature by mouth above 102 F (38.9 C), not controlled by medicine.  Shortness of breath.  Weakness after normal activity.  The white part of the eye turns yellow (jaundice).  You have a decrease in the amount of urine or are urinating less often.  Your urine turns a dark color or changes to pink, red, or brown. Document Released: 09/12/2000 Document Revised: 12/08/2011 Document Reviewed: 05/01/2008 [facility] Patient Information 2014 [place], [place].  _______________________________________________________________________

## 2015-07-04 ENCOUNTER — Encounter (HOSPITAL_COMMUNITY)
Admission: RE | Admit: 2015-07-04 | Discharge: 2015-07-04 | Disposition: A | Discharge status: Home / Self Care | Payer: Medicaid Other | Source: Ambulatory Visit | Attending: Orthopedic Surgery | Admitting: Orthopedic Surgery

## 2015-07-04 ENCOUNTER — Encounter (HOSPITAL_COMMUNITY): Payer: Self-pay

## 2015-07-04 DIAGNOSIS — M1612 Unilateral primary osteoarthritis, left hip: Secondary | ICD-10-CM | POA: Insufficient documentation

## 2015-07-04 DIAGNOSIS — Z01818 Encounter for other preprocedural examination: Secondary | ICD-10-CM | POA: Diagnosis present

## 2015-07-04 HISTORY — DX: Headache, unspecified: R51.9

## 2015-07-04 HISTORY — DX: Headache: R51

## 2015-07-04 HISTORY — DX: Unspecified osteoarthritis, unspecified site: M19.90

## 2015-07-04 HISTORY — DX: Chronic fatigue, unspecified: R53.82

## 2015-07-04 HISTORY — DX: Family history of other specified conditions: Z84.89

## 2015-07-04 LAB — URINE MICROSCOPIC-ADD ON

## 2015-07-04 LAB — URINALYSIS, ROUTINE W REFLEX MICROSCOPIC
Glucose, UA: NEGATIVE mg/dL
HGB URINE DIPSTICK: NEGATIVE
Ketones, ur: NEGATIVE mg/dL
NITRITE: NEGATIVE
PROTEIN: NEGATIVE mg/dL
SPECIFIC GRAVITY, URINE: 1.025 (ref 1.005–1.030)
UROBILINOGEN UA: 0.2 mg/dL (ref 0.0–1.0)
pH: 5.5 (ref 5.0–8.0)

## 2015-07-04 LAB — CBC
HEMATOCRIT: 39.1 % (ref 36.0–46.0)
HEMOGLOBIN: 12.6 g/dL (ref 12.0–15.0)
MCH: 31.2 pg (ref 26.0–34.0)
MCHC: 32.2 g/dL (ref 30.0–36.0)
MCV: 96.8 fL (ref 78.0–100.0)
PLATELETS: 359 10*3/uL (ref 150–400)
RBC: 4.04 MIL/uL (ref 3.87–5.11)
RDW: 14.1 % (ref 11.5–15.5)
WBC: 7 10*3/uL (ref 4.0–10.5)

## 2015-07-04 LAB — BASIC METABOLIC PANEL
ANION GAP: 8 (ref 5–15)
BUN: 15 mg/dL (ref 6–20)
CHLORIDE: 100 mmol/L — AB (ref 101–111)
CO2: 29 mmol/L (ref 22–32)
CREATININE: 0.66 mg/dL (ref 0.44–1.00)
Calcium: 9.7 mg/dL (ref 8.9–10.3)
GFR calc non Af Amer: >60 mL/min (ref >60)
Glucose, Bld: 105 mg/dL — ABNORMAL HIGH (ref 65–99)
POTASSIUM: 5.1 mmol/L (ref 3.5–5.1)
SODIUM: 137 mmol/L (ref 135–145)

## 2015-07-04 LAB — SURGICAL PCR SCREEN
MRSA, PCR: NEGATIVE
Staphylococcus aureus: NEGATIVE

## 2015-07-04 LAB — APTT: aPTT: 28 seconds (ref 24–37)

## 2015-07-04 LAB — PROTIME-INR
INR: 0.96 (ref 0.00–1.49)
Prothrombin Time: 13 seconds (ref 11.6–15.2)

## 2015-07-13 NOTE — H&P (Signed)
TOTAL HIP ADMISSION H&P  Patient is admitted for left total hip arthroplasty, anterior approach.  Subjective:  Chief Complaint:      Left hip primary OA / pain  HPI: Kristin Pope, 59 y.o. female, has a history of pain and functional disability in the left hip(s) due to arthritis and patient has failed non-surgical conservative treatments for greater than 12 weeks to include NSAID's and/or analgesics, corticosteriod injections and activity modification.  Onset of symptoms was gradual starting years ago with gradually worsening course since that time.The patient noted no past surgery on the left hip(s).  Patient currently rates pain in the left hip at 10 out of 10 with activity. Patient has worsening of pain with activity and weight bearing, trendelenberg gait, pain that interfers with activities of daily living and pain with passive range of motion. Patient has evidence of periarticular osteophytes and joint space narrowing by imaging studies. This condition presents safety issues increasing the risk of falls.  There is no current active infection.   Risks, benefits and expectations were discussed with the patient.  Risks including but not limited to the risk of anesthesia, blood clots, nerve damage, blood vessel damage, failure of the prosthesis, infection and up to and including death.  Patient understand the risks, benefits and expectations and wishes to proceed with surgery.   PCP: Marijean Bravo, MD  D/C Plans:      Home with HHPT/SNF  Post-op Meds:       No Rx given  Tranexamic Acid:      To be given - IV   Decadron:      Is to be given  FYI:     ASA post-op  Norco post-op  Dilaudid ok per pt     Patient Active Problem List   Diagnosis Date Noted  . 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 Insomnia 03/03/2011  . OSA (obstructive sleep apnea) 03/03/2011  . Depression, major, recurrent ([place]) 12/02/2010  . CONSTIPATION, SLOW TRANSIT 10/04/2010  . DIVERTICULITIS, COLON, WITH  PERFORATION 08/27/2010  . ABDOMINAL PAIN, LEFT LOWER QUADRANT 08/16/2010  . BACTERIAL VAGINITIS 05/28/2010  . IRRITABLE BOWEL SYNDROME 08/14/2009  . CYSTITIS, CHRONIC INTERSTITIAL 06/27/2009  . UNSPECIFIED HYPOTHYROIDISM 05/16/2009  . LIPOMA OF OTHER SPECIFIED SITES 04/13/2009  . BRUXISM 04/13/2009  . SYNCOPE 03/13/2009  . MRI, BRAIN, ABNORMAL 02/07/2009  . MUSCLE WEAKNESS (GENERALIZED) 12/20/2008  . CERUMEN IMPACTION, BILATERAL 09/18/2008  . CANDIDIASIS OF UNSPECIFIED SITE 08/30/2008  . UNSPECIFIED ALLERGIC ALVEOLITIS AND PNEUMONITIS 08/02/2008  . HIP PAIN, LEFT, CHRONIC 07/04/2008  . MALAISE AND FATIGUE 07/04/2008  . LOW BACK PAIN 05/26/2008  . CHEST PAIN, ATYPICAL 03/02/2008  . EXTRINSIC ASTHMA, WITH EXACERBATION 02/03/2008  . DIABETES MELLITUS, TYPE II, UNCONTROLLED 01/06/2008  . PROTEINURIA 12/23/2007  . HYPERLIPIDEMIA 11/18/2007  . HYPERCALCEMIA 11/18/2007  . HYPERTENSION 09/14/2007  . ANEMIA, B12 DEFICIENCY 05/18/2007  . 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 ALLERGIC RHINITIS 03/29/2007  . FIBROMYALGIA 03/29/2007   Past Medical History  Diagnosis Date  . Allergy   . Depression   . Fibromyalgia   . Hypertension   . Hyperlipidemia   . Low back pain   . Family history of adverse reaction to anesthesia     sister has problems waking up  . Vaginitis     atropic-ongoing abnormal vaginal bleeding-had ultrasound and biopsy in last couple months  . Chronic fatigue   . Headache     constant headaches  . Arthritis  neck and spine, with bone spurs  . Torn rotator cuff     right shoulder    Past Surgical History  Procedure Laterality Date  . Hysteroscopy  01/2011  . Radial optic neurotomy      twice in lumbar area of back-every 6 months  . Colonoscopy w/ polypectomy    . Breast surgery      breast biopsy-benign  . Dilation and curettage of uterus      No prescriptions prior to admission   Allergies  Allergen Reactions  . Codeine      REACTION: Insomnia  . Glutethimides     UNKNOWN  . Hydrocodone-Acetaminophen     REACTION: Nausea Vomiting Fluid retention  . Lactose Intolerance (Gi)     UNKNOWN  . Lipitor [Atorvastatin]     PAIN  . Tetracycline     REACTION: Nausea Vomiting  . Tramadol     UNKNOWN    Social History  Substance Use Topics  . Smoking status: Former Smoker    Quit date: 09/29/1984  . Smokeless tobacco: Not on file  . Alcohol Use: Yes     Comment: wine occassionally    Family History  Problem Relation Age of Onset  . Coronary artery disease Brother   . Heart disease Mother   . Heart attack Mother   . Breast cancer Sister   . Lung cancer Sister   . Breast cancer Maternal Aunt   . Breast cancer Maternal Grandmother   . Stroke Maternal Grandfather   . Brain cancer Paternal Grandfather   . Asthma Mother   . Lung cancer Paternal Grandfather   . Rheum arthritis Maternal Grandmother      Review of Systems  Constitutional: Positive for malaise/fatigue.  Eyes: Negative.   Respiratory: Negative.   Cardiovascular: Negative.   Gastrointestinal: Negative.   Genitourinary: Negative.   Musculoskeletal: Positive for back pain and joint pain.  Skin: Negative.   Neurological: Positive for headaches.  Endo/Heme/Allergies: Positive for environmental allergies.  Psychiatric/Behavioral: Positive for depression. The patient has insomnia.     Objective:  Physical Exam  Constitutional: She is oriented to person, place, and time. She appears well-developed and well-nourished.  HENT:  Head: Normocephalic.  Eyes: Pupils are equal, round, and reactive to light.  Neck: Neck supple. No JVD present. No tracheal deviation present. No thyromegaly present.  Cardiovascular: Normal rate, regular rhythm, normal heart sounds and intact distal pulses.   Respiratory: Effort normal and breath sounds normal. No stridor. No respiratory distress. She has no wheezes.  GI: Soft. There is no tenderness. There is no  guarding.  Musculoskeletal:       Left hip: She exhibits decreased range of motion, decreased strength, tenderness and bony tenderness. She exhibits no swelling, no deformity and no laceration.  Lymphadenopathy:    She has no cervical adenopathy.  Neurological: She is alert and oriented to person, place, and time. A sensory deficit (bilateral LE and right UE ) is present.  Skin: Skin is warm and dry.  Psychiatric: She has a normal mood and affect.      Labs:  Estimated body mass index is 34.72 kg/(m^2) as calculated from the following:   Height as of 01/20/12: 4\' 11"  (1.499 m).   Weight as of 01/20/12: 78.019 kg (172 lb).   Imaging Review Plain radiographs demonstrate severe degenerative joint disease of the left hip(s). The bone quality appears to be good for age and reported activity level.  Assessment/Plan:  End stage arthritis, left  hip(s)  The patient history, physical examination, clinical judgement of the provider and imaging studies are consistent with end stage degenerative joint disease of the left hip(s) and total hip arthroplasty is deemed medically necessary. The treatment options including medical management, injection therapy, arthroscopy and arthroplasty were discussed at length. The risks and benefits of total hip arthroplasty were presented and reviewed. The risks due to aseptic loosening, infection, stiffness, dislocation/subluxation,  thromboembolic complications and other imponderables were discussed.  The patient acknowledged the explanation, agreed to proceed with the plan and consent was signed. Patient is being admitted for inpatient treatment for surgery, pain control, PT, OT, prophylactic antibiotics, VTE prophylaxis, progressive ambulation and ADL's and discharge planning.The patient is planning to be discharged home with home health services vs SNF.      [place] Pugh Calayah Guadarrama   PA-C  07/13/2015, 9:14 PM

## 2015-07-17 ENCOUNTER — Inpatient Hospital Stay (HOSPITAL_COMMUNITY): Payer: Medicaid Other

## 2015-07-17 ENCOUNTER — Inpatient Hospital Stay (HOSPITAL_COMMUNITY): Payer: Medicaid Other | Admitting: Certified Registered Nurse Anesthetist

## 2015-07-17 ENCOUNTER — Encounter (HOSPITAL_COMMUNITY)
Admission: RE | Disposition: A | Discharge status: Skilled Nursing Facility | Payer: Self-pay | Source: Ambulatory Visit | Attending: Orthopedic Surgery

## 2015-07-17 ENCOUNTER — Inpatient Hospital Stay (HOSPITAL_COMMUNITY)
Admission: RE | Admit: 2015-07-17 | Discharge: 2015-07-19 | DRG: 470 | Disposition: A | Discharge status: Skilled Nursing Facility | Payer: Medicaid Other | Source: Ambulatory Visit | Attending: Orthopedic Surgery | Admitting: Orthopedic Surgery

## 2015-07-17 ENCOUNTER — Encounter (HOSPITAL_COMMUNITY): Payer: Self-pay

## 2015-07-17 DIAGNOSIS — M797 Fibromyalgia: Secondary | ICD-10-CM | POA: Diagnosis present

## 2015-07-17 DIAGNOSIS — Z79899 Other long term (current) drug therapy: Secondary | ICD-10-CM | POA: Diagnosis not present

## 2015-07-17 DIAGNOSIS — Z96649 Presence of unspecified artificial hip joint: Secondary | ICD-10-CM

## 2015-07-17 DIAGNOSIS — Z87891 Personal history of nicotine dependence: Secondary | ICD-10-CM | POA: Diagnosis not present

## 2015-07-17 DIAGNOSIS — Z01812 Encounter for preprocedural laboratory examination: Secondary | ICD-10-CM | POA: Diagnosis not present

## 2015-07-17 DIAGNOSIS — I10 Essential (primary) hypertension: Secondary | ICD-10-CM | POA: Diagnosis present

## 2015-07-17 DIAGNOSIS — Z6834 Body mass index (BMI) 34.0-34.9, adult: Secondary | ICD-10-CM

## 2015-07-17 DIAGNOSIS — M25552 Pain in left hip: Secondary | ICD-10-CM | POA: Diagnosis present

## 2015-07-17 DIAGNOSIS — M62838 Other muscle spasm: Secondary | ICD-10-CM

## 2015-07-17 DIAGNOSIS — E669 Obesity, unspecified: Secondary | ICD-10-CM | POA: Diagnosis present

## 2015-07-17 DIAGNOSIS — E039 Hypothyroidism, unspecified: Secondary | ICD-10-CM | POA: Diagnosis present

## 2015-07-17 DIAGNOSIS — E785 Hyperlipidemia, unspecified: Secondary | ICD-10-CM | POA: Diagnosis present

## 2015-07-17 DIAGNOSIS — M1612 Unilateral primary osteoarthritis, left hip: Secondary | ICD-10-CM | POA: Diagnosis present

## 2015-07-17 HISTORY — PX: TOTAL HIP ARTHROPLASTY: SHX124

## 2015-07-17 HISTORY — DX: Presence of unspecified artificial hip joint: Z96.649

## 2015-07-17 LAB — GLUCOSE, CAPILLARY
Glucose-Capillary: 104 mg/dL — ABNORMAL HIGH (ref 65–99)
Glucose-Capillary: 94 mg/dL (ref 65–99)

## 2015-07-17 LAB — ABO/RH: ABO/RH(D): A NEG

## 2015-07-17 LAB — TYPE AND SCREEN
ABO/RH(D): A NEG
Antibody Screen: NEGATIVE

## 2015-07-17 SURGERY — ARTHROPLASTY, HIP, TOTAL, ANTERIOR APPROACH
Anesthesia: Spinal | Site: Hip | Laterality: Left

## 2015-07-17 MED ORDER — BISACODYL 10 MG RE SUPP: 10.0000 mg | Freq: Every day | RECTAL | Status: DC | PRN

## 2015-07-17 MED ORDER — HYDROMORPHONE HCL 1 MG/ML IJ SOLN
INTRAMUSCULAR | Status: AC
  Filled 2015-07-17: qty 1

## 2015-07-17 MED ORDER — ONDANSETRON HCL 4 MG/2ML IJ SOLN
INTRAMUSCULAR | Status: AC
  Filled 2015-07-17: qty 2

## 2015-07-17 MED ORDER — ARMODAFINIL 150 MG PO TABS
1.0000 | ORAL_TABLET | Freq: Every day | ORAL | Status: DC
  Filled 2015-07-17 (×2): qty 1

## 2015-07-17 MED ORDER — LUBIPROSTONE 24 MCG PO CAPS
24.0000 ug | ORAL_CAPSULE | Freq: Every day | ORAL | Status: DC
  Administered 2015-07-18 – 2015-07-19 (×2): 24 ug via ORAL
  Filled 2015-07-17 (×3): qty 1

## 2015-07-17 MED ORDER — FAMOTIDINE 20 MG PO TABS
20.0000 mg | ORAL_TABLET | Freq: Every day | ORAL | Status: DC | PRN
  Filled 2015-07-17: qty 1

## 2015-07-17 MED ORDER — LIDOCAINE HCL (CARDIAC) 20 MG/ML IV SOLN
INTRAVENOUS | Status: AC
  Filled 2015-07-17: qty 5

## 2015-07-17 MED ORDER — TRANEXAMIC ACID 1000 MG/10ML IV SOLN
1000.0000 mg | Freq: Once | INTRAVENOUS | Status: AC
  Administered 2015-07-17: 1000 mg via INTRAVENOUS
  Filled 2015-07-17: qty 10

## 2015-07-17 MED ORDER — SODIUM CHLORIDE 0.9 % IV SOLN
100.0000 mL/h | INTRAVENOUS | Status: DC
  Administered 2015-07-17 – 2015-07-18 (×2): 100 mL/h via INTRAVENOUS
  Filled 2015-07-17 (×7): qty 1000

## 2015-07-17 MED ORDER — METOCLOPRAMIDE HCL 10 MG PO TABS: 5.0000 mg | ORAL_TABLET | Freq: Three times a day (TID) | ORAL | Status: DC | PRN

## 2015-07-17 MED ORDER — FENTANYL CITRATE (PF) 100 MCG/2ML IJ SOLN
INTRAMUSCULAR | Status: AC
  Filled 2015-07-17: qty 2

## 2015-07-17 MED ORDER — MEPERIDINE HCL 50 MG/ML IJ SOLN: 6.2500 mg | INTRAMUSCULAR | Status: DC | PRN

## 2015-07-17 MED ORDER — BUPIVACAINE HCL (PF) 0.75 % IJ SOLN
INTRAMUSCULAR | Status: DC | PRN
  Administered 2015-07-17: 1.2 mL via INTRATHECAL

## 2015-07-17 MED ORDER — PROPOFOL 10 MG/ML IV BOLUS
INTRAVENOUS | Status: DC | PRN
  Administered 2015-07-17: 50 mg via INTRAVENOUS
  Administered 2015-07-17: 20 mg via INTRAVENOUS
  Administered 2015-07-17: 30 mg via INTRAVENOUS
  Administered 2015-07-17 (×3): 50 mg via INTRAVENOUS

## 2015-07-17 MED ORDER — DEXAMETHASONE SODIUM PHOSPHATE 10 MG/ML IJ SOLN
10.0000 mg | Freq: Once | INTRAMUSCULAR | Status: AC
  Administered 2015-07-18: 10 mg via INTRAVENOUS
  Filled 2015-07-17: qty 1

## 2015-07-17 MED ORDER — ONDANSETRON HCL 4 MG/2ML IJ SOLN: 4.0000 mg | Freq: Four times a day (QID) | INTRAMUSCULAR | Status: DC | PRN

## 2015-07-17 MED ORDER — MIDAZOLAM HCL 2 MG/2ML IJ SOLN
INTRAMUSCULAR | Status: AC
  Filled 2015-07-17: qty 4

## 2015-07-17 MED ORDER — HYDROCODONE-ACETAMINOPHEN 5-325 MG PO TABS
0.5000 | ORAL_TABLET | ORAL | Status: DC
  Administered 2015-07-17 – 2015-07-18 (×4): 1 via ORAL
  Filled 2015-07-17 (×10): qty 1

## 2015-07-17 MED ORDER — DIPHENHYDRAMINE HCL 25 MG PO CAPS: 25.0000 mg | ORAL_CAPSULE | Freq: Four times a day (QID) | ORAL | Status: DC | PRN

## 2015-07-17 MED ORDER — PROPOFOL 10 MG/ML IV BOLUS
INTRAVENOUS | Status: AC
  Filled 2015-07-17: qty 20

## 2015-07-17 MED ORDER — CEFAZOLIN SODIUM-DEXTROSE 2-3 GM-% IV SOLR
2.0000 g | Freq: Four times a day (QID) | INTRAVENOUS | Status: AC
  Administered 2015-07-17 (×2): 2 g via INTRAVENOUS
  Filled 2015-07-17 (×2): qty 50

## 2015-07-17 MED ORDER — ONDANSETRON HCL 4 MG PO TABS: 4.0000 mg | ORAL_TABLET | Freq: Four times a day (QID) | ORAL | Status: DC | PRN

## 2015-07-17 MED ORDER — BUDESONIDE-FORMOTEROL FUMARATE 160-4.5 MCG/ACT IN AERO: 2.0000 | INHALATION_SPRAY | Freq: Two times a day (BID) | RESPIRATORY_TRACT | Status: DC | PRN

## 2015-07-17 MED ORDER — CELECOXIB 200 MG PO CAPS
200.0000 mg | ORAL_CAPSULE | Freq: Two times a day (BID) | ORAL | Status: DC
  Administered 2015-07-17 – 2015-07-19 (×4): 200 mg via ORAL
  Filled 2015-07-17 (×5): qty 1

## 2015-07-17 MED ORDER — MONTELUKAST SODIUM 10 MG PO TABS
10.0000 mg | ORAL_TABLET | Freq: Every day | ORAL | Status: DC
  Administered 2015-07-18 – 2015-07-19 (×2): 10 mg via ORAL
  Filled 2015-07-17 (×2): qty 1

## 2015-07-17 MED ORDER — CHLORHEXIDINE GLUCONATE 4 % EX LIQD: 60.0000 mL | Freq: Once | CUTANEOUS | Status: DC

## 2015-07-17 MED ORDER — DEXAMETHASONE SODIUM PHOSPHATE 10 MG/ML IJ SOLN
INTRAMUSCULAR | Status: AC
  Filled 2015-07-17: qty 1

## 2015-07-17 MED ORDER — ONDANSETRON HCL 4 MG/2ML IJ SOLN
INTRAMUSCULAR | Status: DC | PRN
  Administered 2015-07-17: 4 mg via INTRAVENOUS

## 2015-07-17 MED ORDER — DULOXETINE HCL 60 MG PO CPEP
60.0000 mg | ORAL_CAPSULE | Freq: Every day | ORAL | Status: DC
  Administered 2015-07-18 – 2015-07-19 (×2): 60 mg via ORAL
  Filled 2015-07-17 (×2): qty 1

## 2015-07-17 MED ORDER — OMEPRAZOLE 20 MG PO CPDR
40.0000 mg | DELAYED_RELEASE_CAPSULE | Freq: Every day | ORAL | Status: DC
  Administered 2015-07-18 (×2): 40 mg via ORAL
  Filled 2015-07-17 (×4): qty 2

## 2015-07-17 MED ORDER — POTASSIUM CHLORIDE ER 10 MEQ PO TBCR
10.0000 meq | EXTENDED_RELEASE_TABLET | Freq: Every day | ORAL | Status: DC
  Administered 2015-07-18 – 2015-07-19 (×2): 10 meq via ORAL
  Filled 2015-07-17 (×4): qty 1

## 2015-07-17 MED ORDER — FENTANYL CITRATE (PF) 100 MCG/2ML IJ SOLN
25.0000 ug | INTRAMUSCULAR | Status: DC | PRN
  Administered 2015-07-17: 50 ug via INTRAVENOUS

## 2015-07-17 MED ORDER — CEFAZOLIN SODIUM-DEXTROSE 2-3 GM-% IV SOLR
INTRAVENOUS | Status: AC
  Filled 2015-07-17: qty 50

## 2015-07-17 MED ORDER — PROMETHAZINE HCL 25 MG/ML IJ SOLN: 6.2500 mg | INTRAMUSCULAR | Status: DC | PRN

## 2015-07-17 MED ORDER — SODIUM CHLORIDE 0.9 % IR SOLN
Status: DC | PRN
  Administered 2015-07-17: 1000 mL

## 2015-07-17 MED ORDER — HYDROMORPHONE HCL 1 MG/ML IJ SOLN
0.5000 mg | INTRAMUSCULAR | Status: DC | PRN
  Administered 2015-07-17: 0.5 mg via INTRAVENOUS
  Administered 2015-07-17 – 2015-07-18 (×3): 1 mg via INTRAVENOUS
  Filled 2015-07-17 (×4): qty 1

## 2015-07-17 MED ORDER — LIDOCAINE HCL (CARDIAC) 20 MG/ML IV SOLN
INTRAVENOUS | Status: DC | PRN
  Administered 2015-07-17: 50 mg via INTRAVENOUS

## 2015-07-17 MED ORDER — LACTATED RINGERS IV SOLN: INTRAVENOUS | Status: DC

## 2015-07-17 MED ORDER — ALUM & MAG HYDROXIDE-SIMETH 200-200-20 MG/5ML PO SUSP
30.0000 mL | ORAL | Status: DC | PRN
  Administered 2015-07-18: 30 mL via ORAL
  Filled 2015-07-17: qty 30

## 2015-07-17 MED ORDER — DEXAMETHASONE SODIUM PHOSPHATE 10 MG/ML IJ SOLN
10.0000 mg | Freq: Once | INTRAMUSCULAR | Status: AC
  Administered 2015-07-17: 10 mg via INTRAVENOUS

## 2015-07-17 MED ORDER — METHOCARBAMOL 1000 MG/10ML IJ SOLN
500.0000 mg | Freq: Four times a day (QID) | INTRAVENOUS | Status: DC | PRN
  Administered 2015-07-17: 500 mg via INTRAVENOUS
  Filled 2015-07-17 (×2): qty 5

## 2015-07-17 MED ORDER — CLONAZEPAM 1 MG PO TABS
1.0000 mg | ORAL_TABLET | Freq: Four times a day (QID) | ORAL | Status: DC
  Administered 2015-07-17 – 2015-07-19 (×8): 1 mg via ORAL
  Filled 2015-07-17 (×8): qty 1

## 2015-07-17 MED ORDER — METOCLOPRAMIDE HCL 5 MG/ML IJ SOLN: 5.0000 mg | Freq: Three times a day (TID) | INTRAMUSCULAR | Status: DC | PRN

## 2015-07-17 MED ORDER — NON FORMULARY: 40.0000 mg | Freq: Every day | Status: DC

## 2015-07-17 MED ORDER — LACTASE 3000 UNITS PO TABS
1.0000 | ORAL_TABLET | Freq: Every day | ORAL | Status: DC
  Administered 2015-07-18: 3000 [IU] via ORAL
  Filled 2015-07-17 (×4): qty 1

## 2015-07-17 MED ORDER — MAGNESIUM CITRATE PO SOLN: 1.0000 | Freq: Once | ORAL | Status: DC | PRN

## 2015-07-17 MED ORDER — FUROSEMIDE 20 MG PO TABS
20.0000 mg | ORAL_TABLET | ORAL | Status: DC
  Administered 2015-07-18: 20 mg via ORAL
  Filled 2015-07-17: qty 1

## 2015-07-17 MED ORDER — PROPOFOL 500 MG/50ML IV EMUL
INTRAVENOUS | Status: DC | PRN
  Administered 2015-07-17: 100 ug/kg/min via INTRAVENOUS

## 2015-07-17 MED ORDER — POLYETHYLENE GLYCOL 3350 17 G PO PACK
17.0000 g | PACK | Freq: Two times a day (BID) | ORAL | Status: DC
  Administered 2015-07-18 – 2015-07-19 (×2): 17 g via ORAL

## 2015-07-17 MED ORDER — ZOLPIDEM TARTRATE 5 MG PO TABS
5.0000 mg | ORAL_TABLET | Freq: Every evening | ORAL | Status: DC | PRN
  Administered 2015-07-17: 5 mg via ORAL
  Filled 2015-07-17: qty 1

## 2015-07-17 MED ORDER — CEFAZOLIN SODIUM-DEXTROSE 2-3 GM-% IV SOLR
2.0000 g | INTRAVENOUS | Status: AC
  Administered 2015-07-17: 2 g via INTRAVENOUS

## 2015-07-17 MED ORDER — LORATADINE 10 MG PO TABS
10.0000 mg | ORAL_TABLET | Freq: Every day | ORAL | Status: DC
  Administered 2015-07-18 – 2015-07-19 (×2): 10 mg via ORAL
  Filled 2015-07-17 (×3): qty 1

## 2015-07-17 MED ORDER — AZELASTINE HCL 0.1 % NA SOLN: 1.0000 | Freq: Two times a day (BID) | NASAL | Status: DC | PRN

## 2015-07-17 MED ORDER — ASPIRIN EC 325 MG PO TBEC
325.0000 mg | DELAYED_RELEASE_TABLET | Freq: Two times a day (BID) | ORAL | Status: DC
  Administered 2015-07-18 – 2015-07-19 (×3): 325 mg via ORAL
  Filled 2015-07-17 (×5): qty 1

## 2015-07-17 MED ORDER — ACETAMINOPHEN 10 MG/ML IV SOLN
INTRAVENOUS | Status: AC
  Filled 2015-07-17: qty 100

## 2015-07-17 MED ORDER — NADOLOL 40 MG PO TABS
40.0000 mg | ORAL_TABLET | Freq: Every day | ORAL | Status: DC
  Administered 2015-07-18 – 2015-07-19 (×2): 40 mg via ORAL
  Filled 2015-07-17 (×2): qty 1

## 2015-07-17 MED ORDER — FERROUS SULFATE 325 (65 FE) MG PO TABS
325.0000 mg | ORAL_TABLET | Freq: Three times a day (TID) | ORAL | Status: DC
  Administered 2015-07-18 – 2015-07-19 (×5): 325 mg via ORAL
  Filled 2015-07-17 (×8): qty 1

## 2015-07-17 MED ORDER — DOCUSATE SODIUM 100 MG PO CAPS
100.0000 mg | ORAL_CAPSULE | Freq: Two times a day (BID) | ORAL | Status: DC
  Administered 2015-07-18 – 2015-07-19 (×3): 100 mg via ORAL

## 2015-07-17 MED ORDER — MIDAZOLAM HCL 5 MG/5ML IJ SOLN
INTRAMUSCULAR | Status: DC | PRN
  Administered 2015-07-17 (×2): 2 mg via INTRAVENOUS

## 2015-07-17 MED ORDER — MENTHOL 3 MG MT LOZG: 1.0000 | LOZENGE | OROMUCOSAL | Status: DC | PRN

## 2015-07-17 MED ORDER — DOXEPIN HCL 50 MG PO CAPS
50.0000 mg | ORAL_CAPSULE | Freq: Every evening | ORAL | Status: DC | PRN
  Administered 2015-07-17: 50 mg via ORAL
  Filled 2015-07-17 (×2): qty 1

## 2015-07-17 MED ORDER — PHENOL 1.4 % MT LIQD: 1.0000 | OROMUCOSAL | Status: DC | PRN

## 2015-07-17 MED ORDER — NITROFURANTOIN MACROCRYSTAL 100 MG PO CAPS
100.0000 mg | ORAL_CAPSULE | Freq: Every day | ORAL | Status: DC
  Administered 2015-07-17 – 2015-07-18 (×2): 100 mg via ORAL
  Filled 2015-07-17 (×3): qty 1

## 2015-07-17 MED ORDER — ACETAMINOPHEN 10 MG/ML IV SOLN: 1000.0000 mg | Freq: Four times a day (QID) | INTRAVENOUS | Status: DC

## 2015-07-17 MED ORDER — FENTANYL CITRATE (PF) 100 MCG/2ML IJ SOLN
INTRAMUSCULAR | Status: DC | PRN
  Administered 2015-07-17: 100 ug via INTRAVENOUS

## 2015-07-17 MED ORDER — METHOCARBAMOL 500 MG PO TABS
500.0000 mg | ORAL_TABLET | Freq: Four times a day (QID) | ORAL | Status: DC | PRN
Start: 2015-07-17 — End: 2015-07-19
  Administered 2015-07-17: 500 mg via ORAL
  Filled 2015-07-17: qty 1

## 2015-07-17 MED ORDER — LACTATED RINGERS IV SOLN
INTRAVENOUS | Status: DC
Start: 2015-07-17 — End: 2015-07-17
  Administered 2015-07-17: 12:00:00 via INTRAVENOUS
  Administered 2015-07-17: 1000 mL via INTRAVENOUS

## 2015-07-17 MED ORDER — FENTANYL CITRATE (PF) 100 MCG/2ML IJ SOLN
INTRAMUSCULAR | Status: AC
  Filled 2015-07-17: qty 4

## 2015-07-17 MED ORDER — HYDROMORPHONE HCL 1 MG/ML IJ SOLN
0.5000 mg | INTRAMUSCULAR | Status: DC | PRN
  Administered 2015-07-17 (×3): 0.5 mg via INTRAVENOUS

## 2015-07-17 SURGICAL SUPPLY — 46 items
BAG DECANTER FOR FLEXI CONT (MISCELLANEOUS) IMPLANT
BAG SPEC THK2 15X12 ZIP CLS (MISCELLANEOUS)
BAG ZIPLOCK 12X15 (MISCELLANEOUS) IMPLANT
CAPT HIP TOTAL 2 ×2 IMPLANT
COVER PERINEAL POST (MISCELLANEOUS) ×3 IMPLANT
DRAPE C-ARM 42X120 X-RAY (DRAPES) ×1 IMPLANT
DRAPE STERI IOBAN 125X83 (DRAPES) ×1 IMPLANT
DRAPE U-SHAPE 47X51 STRL (DRAPES) ×9 IMPLANT
DRSG AQUACEL AG ADV 3.5X10 (GAUZE/BANDAGES/DRESSINGS) ×3 IMPLANT
DURAPREP 26ML APPLICATOR (WOUND CARE) ×3 IMPLANT
ELECT BLADE TIP CTD 4 INCH (ELECTRODE) ×1 IMPLANT
ELECT PENCIL ROCKER SW 15FT (MISCELLANEOUS) IMPLANT
ELECT REM PT RETURN 15FT ADLT (MISCELLANEOUS) ×2 IMPLANT
ELECT REM PT RETURN 9FT ADLT (ELECTROSURGICAL) ×3
ELECTRODE REM PT RTRN 9FT ADLT (ELECTROSURGICAL) ×1 IMPLANT
FACESHIELD WRAPAROUND (MASK) ×12 IMPLANT
FACESHIELD WRAPAROUND OR TEAM (MASK) ×4 IMPLANT
GLOVE BIOGEL M STRL SZ7.5 (GLOVE) ×4 IMPLANT
GLOVE BIOGEL PI IND STRL 7.5 (GLOVE) ×1 IMPLANT
GLOVE BIOGEL PI IND STRL 8.5 (GLOVE) ×1 IMPLANT
GLOVE BIOGEL PI INDICATOR 7.5 (GLOVE) ×4
GLOVE BIOGEL PI INDICATOR 8.5 (GLOVE)
GLOVE ECLIPSE 8.0 STRL XLNG CF (GLOVE) ×2 IMPLANT
GLOVE ORTHO TXT STRL SZ7.5 (GLOVE) ×3 IMPLANT
GOWN SPEC L3 XXLG W/TWL (GOWN DISPOSABLE) ×1 IMPLANT
GOWN STRL REUS W/TWL LRG LVL3 (GOWN DISPOSABLE) ×5 IMPLANT
HOLDER FOLEY CATH W/STRAP (MISCELLANEOUS) ×3 IMPLANT
KIT BASIN OR (CUSTOM PROCEDURE TRAY) ×1 IMPLANT
LIQUID BAND (GAUZE/BANDAGES/DRESSINGS) ×3 IMPLANT
NDL SAFETY ECLIPSE 18X1.5 (NEEDLE) IMPLANT
NEEDLE HYPO 18GX1.5 SHARP (NEEDLE)
PACK TOTAL JOINT (CUSTOM PROCEDURE TRAY) ×1 IMPLANT
PEN SKIN MARKING BROAD (MISCELLANEOUS) ×1 IMPLANT
SAW OSC TIP CART 19.5X105X1.3 (SAW) ×3 IMPLANT
SUT MNCRL AB 4-0 PS2 18 (SUTURE) ×3 IMPLANT
SUT VIC AB 1 CT1 36 (SUTURE) ×9 IMPLANT
SUT VIC AB 2-0 CT1 27 (SUTURE) ×6
SUT VIC AB 2-0 CT1 TAPERPNT 27 (SUTURE) ×2 IMPLANT
SUT VLOC 180 0 24IN GS25 (SUTURE) ×3 IMPLANT
SYR 50ML LL SCALE MARK (SYRINGE) IMPLANT
TOWEL OR 17X26 10 PK STRL BLUE (TOWEL DISPOSABLE) ×1 IMPLANT
TOWEL OR NON WOVEN STRL DISP B (DISPOSABLE) IMPLANT
TRAY FOLEY W/METER SILVER 14FR (SET/KITS/TRAYS/PACK) ×3 IMPLANT
TRAY FOLEY W/METER SILVER 16FR (SET/KITS/TRAYS/PACK) ×1 IMPLANT
WATER STERILE IRR 1500ML POUR (IV SOLUTION) ×3 IMPLANT
YANKAUER SUCT BULB TIP 10FT TU (MISCELLANEOUS) ×1 IMPLANT

## 2015-07-17 NOTE — Op Note (Signed)
NAME:  Kristin Pope                ACCOUNT NO.: [id number]      MEDICAL RECORD NO.: [id number]      FACILITY:  [facility]      PHYSICIAN:  Paralee Cancel D  DATE OF BIRTH:  08-21-1956     DATE OF PROCEDURE:  07/17/2015                                 OPERATIVE REPORT         PREOPERATIVE DIAGNOSIS: Left  hip osteoarthritis.      POSTOPERATIVE DIAGNOSIS:  Left hip osteoarthritis.      PROCEDURE:  Left total hip replacement through an anterior approach   utilizing DePuy THR system, component size 23mm pinnacle cup, a size 32+4 neutral   Altrex liner, a size 0 Hi Tri Lock stem with a 32+1 delta ceramic   ball.      SURGEON:  Pietro Cassis. Alvan Dame, M.D.      ASSISTANT:  Nehemiah Massed, PA-C     ANESTHESIA:  Spinal.      SPECIMENS:  None.      COMPLICATIONS:  None.      BLOOD LOSS:  200 cc     DRAINS:  None.      INDICATION OF THE PROCEDURE:  Kristin Pope is a 59 y.o. female who had   presented to office for evaluation of left hip pain.  Radiographs revealed   progressive degenerative changes with bone-on-bone   articulation to the  hip joint.  The patient had painful limited range of   motion significantly affecting their overall quality of life.  The patient was failing to    respond to conservative measures, and at this point was ready   to proceed with more definitive measures.  The patient has noted progressive   degenerative changes in his hip, progressive problems and dysfunction   with regarding the hip prior to surgery.  Consent was obtained for   benefit of pain relief.  Specific risk of infection, DVT, component   failure, dislocation, need for revision surgery, as well discussion of   the anterior versus posterior approach were reviewed.  Consent was   obtained for benefit of anterior pain relief through an anterior   approach.      PROCEDURE IN DETAIL:  The patient was brought to operative theater.   Once adequate anesthesia, preoperative  antibiotics, 2gm of Ancef, 1 gm of Tranexamic Acid, and 10 mg of Decadron administered.   The patient was positioned supine on the OSI Hanna table.  Once adequate   padding of boney process was carried out, we had predraped out the hip, and  used fluoroscopy to confirm orientation of the pelvis and position.      The left hip was then prepped and draped from proximal iliac crest to   mid thigh with shower curtain technique.      Time-out was performed identifying the patient, planned procedure, and   extremity.     An incision was then made 2 cm distal and lateral to the   anterior superior iliac spine extending over the orientation of the   tensor fascia lata muscle and sharp dissection was carried down to the   fascia of the muscle and protractor placed in the soft tissues.      The fascia was  then incised.  The muscle belly was identified and swept   laterally and retractor placed along the superior neck.  Following   cauterization of the circumflex vessels and removing some pericapsular   fat, a second cobra retractor was placed on the inferior neck.  A third   retractor was placed on the anterior acetabulum after elevating the   anterior rectus.  A L-capsulotomy was along the line of the   superior neck to the trochanteric fossa, then extended proximally and   distally.  Tag sutures were placed and the retractors were then placed   intracapsular.  We then identified the trochanteric fossa and   orientation of my neck cut, confirmed this radiographically   and then made a neck osteotomy with the femur on traction.  The femoral   head was removed without difficulty or complication.  Traction was let   off and retractors were placed posterior and anterior around the   acetabulum.      The labrum and foveal tissue were debrided.  I began reaming with a 37mm   reamer and reamed up to 55mm reamer with good bony bed preparation and a 55mm   cup was chosen.  The final 81mm Pinnacle cup  was then impacted under fluoroscopy  to confirm the depth of penetration and orientation with respect to   abduction.  A screw was placed followed by the hole eliminator.  The final   32+4 neutral Altrex liner was impacted with good visualized rim fit.  The cup was positioned anatomically within the acetabular portion of the pelvis.      At this point, the femur was rolled at 80 degrees.  Further capsule was   released off the inferior aspect of the femoral neck.  I then   released the superior capsule proximally.  The hook was placed laterally   along the femur and elevated manually and held in position with the bed   hook.  The leg was then extended and adducted with the leg rolled to 100   degrees of external rotation.  Once the proximal femur was fully   exposed, I used a box osteotome to set orientation.  I then began   broaching with the starting chili pepper broach and passed this by hand and then broached up to just the 0 broach.  With the 0 broach in place I chose a high offset neck and did a trial reduction.  The offset was appropriate, leg lengths   appeared to be equal, confirmed radiographically.   Given these findings, I went ahead and dislocated the hip, repositioned all   retractors and positioned the right hip in the extended and abducted position.  The final 0 Hi  Tri Lock stem was   chosen and it was impacted down to the level of neck cut.  Based on this   and the trial reduction, a 32+1 delta ceramic ball was chosen and   impacted onto a clean and dry trunnion, and the hip was reduced.  The   hip had been irrigated throughout the case again at this point.  I did   reapproximate the superior capsular leaflet to the anterior leaflet   using #1 Vicryl.  The fascia of the   tensor fascia lata muscle was then reapproximated using #1 Vicryl and #0 V-lock sutures.  The   remaining wound was closed with 2-0 Vicryl and running 4-0 Monocryl.   The hip was cleaned, dried, and dressed  sterilely using  Dermabond and   Aquacel dressing.  She was then brought   to recovery room in stable condition tolerating the procedure well.    Nehemiah Massed, PA-C was present for the entirety of the case involved from   preoperative positioning, perioperative retractor management, general   facilitation of the case, as well as primary wound closure as assistant.            Pietro Cassis Alvan Dame, M.D.        07/17/2015 12:03 PM

## 2015-07-17 NOTE — Interval H&P Note (Signed)
History and Physical Interval Note:  07/17/2015 9:44 AM  Kristin Pope  has presented today for surgery, with the diagnosis of OA LEFT HIP  The various methods of treatment have been discussed with the patient and family. After consideration of risks, benefits and other options for treatment, the patient has consented to  Procedure(s): LEFT TOTAL HIP ARTHROPLASTY ANTERIOR APPROACH (Left) as a surgical intervention .  The patient's history has been reviewed, patient examined, no change in status, stable for surgery.  I have reviewed the patient's chart and labs.  Questions were answered to the patient's satisfaction.     [name]

## 2015-07-17 NOTE — Discharge Instructions (Signed)

## 2015-07-17 NOTE — Anesthesia Procedure Notes (Signed)
Spinal Patient location during procedure: pre-op Start time: 07/17/2015 10:50 AM End time: 07/17/2015 11:52 AM Staffing Anesthesiologist: Montez Hageman Resident/CRNA: Lupita Raider F Performed by: resident/CRNA  Preanesthetic Checklist Completed: patient identified, site marked, surgical consent, pre-op evaluation, timeout performed, IV checked, risks and benefits discussed and monitors and equipment checked Spinal Block Patient position: sitting Prep: Betadine Patient monitoring: heart rate, cardiac monitor, continuous pulse ox and blood pressure Approach: midline Location: L3-4 Injection technique: single-shot Needle Needle type: Quincke  Needle gauge: 22 G Needle length: 5 cm Assessment Sensory level: T4 Additional Notes Sterile prep and drape. SAB placed with clear+ swirl CSF return. Injected Bupivicaine 0.75% 1.63ml  Spinocan Spinal Needle Tray Lot# [id number] Exp 2016-11-26 No paresthesias noted.

## 2015-07-17 NOTE — Plan of Care (Signed)
Problem: Consults Goal: Diagnosis- Total Joint Replacement Left anterior hip     

## 2015-07-17 NOTE — Progress Notes (Signed)
States completed Cipro as per ordered by Dr Alvan Dame

## 2015-07-17 NOTE — Transfer of Care (Signed)
Immediate Anesthesia Transfer of Care Note  Patient: Kristin Pope  Procedure(s) Performed: Procedure(s): LEFT TOTAL HIP ARTHROPLASTY ANTERIOR APPROACH (Left)  Patient Location: PACU  Anesthesia Type:MAC  Level of Consciousness: awake, alert  and oriented  Airway & Oxygen Therapy: Patient Spontanous Breathing and Patient connected to face mask oxygen  Post-op Assessment: Report given to RN and Post -op Vital signs reviewed and stable  Post vital signs: Reviewed and stable  Last Vitals:  Filed Vitals:   07/17/15 0851  BP: 142/67  Pulse: 62  Temp: 36.8 C  Resp: 16    Complications: No apparent anesthesia complications

## 2015-07-17 NOTE — Anesthesia Preprocedure Evaluation (Signed)
Anesthesia Evaluation  Patient identified by MRN, date of birth, ID band Patient awake    Reviewed: Allergy & Precautions, NPO status , Patient's Chart, lab work & pertinent test results  Airway Mallampati: II  TM Distance: >3 FB Neck ROM: Full    Dental no notable dental hx.    Pulmonary former smoker,    Pulmonary exam normal breath sounds clear to auscultation       Cardiovascular hypertension, Pt. on medications Normal cardiovascular exam Rhythm:Regular Rate:Normal     Neuro/Psych negative neurological ROS  negative psych ROS   GI/Hepatic negative GI ROS, Neg liver ROS,   Endo/Other  negative endocrine ROS  Renal/GU negative Renal ROS  negative genitourinary   Musculoskeletal  (+) Fibromyalgia -  Abdominal   Peds negative pediatric ROS (+)  Hematology negative hematology ROS (+)   Anesthesia Other Findings   Reproductive/Obstetrics negative OB ROS                             Anesthesia Physical Anesthesia Plan  ASA: II  Anesthesia Plan: Spinal   Post-op Pain Management:    Induction:   Airway Management Planned: Simple Face Mask  Additional Equipment:   Intra-op Plan:   Post-operative Plan:   Informed Consent: I have reviewed the patients History and Physical, chart, labs and discussed the procedure including the risks, benefits and alternatives for the proposed anesthesia with the patient or authorized representative who has indicated his/her understanding and acceptance.   Dental advisory given  Plan Discussed with: CRNA  Anesthesia Plan Comments:         Anesthesia Quick Evaluation

## 2015-07-17 NOTE — Anesthesia Postprocedure Evaluation (Signed)
  Anesthesia Post-op Note  Patient: Kristin Pope  Procedure(s) Performed: Procedure(s) (LRB): LEFT TOTAL HIP ARTHROPLASTY ANTERIOR APPROACH (Left)  Patient Location: PACU  Anesthesia Type: Spinal  Level of Consciousness: awake and alert   Airway and Oxygen Therapy: Patient Spontanous Breathing  Post-op Pain: mild  Post-op Assessment: Post-op Vital signs reviewed, Patient's Cardiovascular Status Stable, Respiratory Function Stable, Patent Airway and No signs of Nausea or vomiting  Last Vitals:  Filed Vitals:   07/17/15 1400  BP:   Pulse: 72  Temp:   Resp: 11    Post-op Vital Signs: stable   Complications: No apparent anesthesia complications

## 2015-07-18 LAB — BASIC METABOLIC PANEL
Anion gap: 10 (ref 5–15)
BUN: 10 mg/dL (ref 6–20)
CHLORIDE: 101 mmol/L (ref 101–111)
CO2: 27 mmol/L (ref 22–32)
Calcium: 9.4 mg/dL (ref 8.9–10.3)
Creatinine, Ser: 0.62 mg/dL (ref 0.44–1.00)
GFR calc non Af Amer: >60 mL/min (ref >60)
Glucose, Bld: 134 mg/dL — ABNORMAL HIGH (ref 65–99)
POTASSIUM: 4.4 mmol/L (ref 3.5–5.1)
Sodium: 138 mmol/L (ref 135–145)

## 2015-07-18 LAB — CBC
HEMATOCRIT: 33.2 % — AB (ref 36.0–46.0)
HEMOGLOBIN: 10.8 g/dL — AB (ref 12.0–15.0)
MCH: 31.8 pg (ref 26.0–34.0)
MCHC: 32.5 g/dL (ref 30.0–36.0)
MCV: 97.6 fL (ref 78.0–100.0)
Platelets: 312 10*3/uL (ref 150–400)
RBC: 3.4 MIL/uL — ABNORMAL LOW (ref 3.87–5.11)
RDW: 14.1 % (ref 11.5–15.5)
WBC: 10.2 10*3/uL (ref 4.0–10.5)

## 2015-07-18 MED ORDER — ARMODAFINIL 150 MG PO TABS: 1.0000 | ORAL_TABLET | Freq: Every day | ORAL | Status: DC | PRN

## 2015-07-18 MED ORDER — HYDROMORPHONE HCL 2 MG PO TABS
2.0000 mg | ORAL_TABLET | ORAL | Status: DC | PRN
  Administered 2015-07-18 – 2015-07-19 (×6): 2 mg via ORAL
  Filled 2015-07-18 (×6): qty 1

## 2015-07-18 NOTE — Care Management Note (Signed)
Case Management Note  Patient Details  Name: IZZABELL KLASEN MRN: [id number] Date of Birth: 1956/04/06  Subjective/Objective:                   LEFT TOTAL HIP ARTHROPLASTY ANTERIOR APPROACH (Left) Action/Plan:  Discharge planning Expected Discharge Date:                  Expected Discharge Plan:  [place]  In-House Referral:     Discharge planning Services  CM Consult  Post Acute Care Choice:    Choice offered to:     DME Arranged:    DME Agency:     HH Arranged:    [place] Agency:     Status of Service:  Completed, signed off  Medicare Important Message Given:    Date Medicare IM Given:    Medicare IM give by:    Date Additional Medicare IM Given:    Additional Medicare Important Message give by:     If discussed at [place] of Stay Meetings, dates discussed:    Additional Comments: Cm notes pt to go to SNF for rehab; CSW aware and arranging.  No other CM needs were communicated. Dellie Catholic, RN 07/18/2015, 11:42 AM

## 2015-07-18 NOTE — Evaluation (Signed)
Physical Therapy Evaluation Patient Details Name: Kristin Pope MRN: [id number] DOB: 11-Sep-1956 Today's Date: 07/18/2015   History of Present Illness  s/p L DA THA  Clinical Impression  Pt s/p L THR presents with decreased L LE strength/ROM and post op pain limiting functional mobility.  Pt would benefit from follow up rehab at SNF level to maximize IND and safety prior to return home ALONE.    Follow Up Recommendations SNF    Equipment Recommendations  None recommended by PT    Recommendations for Other Services OT consult     Precautions / Restrictions Precautions Precautions: Fall Restrictions Weight Bearing Restrictions: No LLE Weight Bearing: Weight bearing as tolerated      Mobility  Bed Mobility Overal bed mobility: Needs Assistance Bed Mobility: Supine to Sit     Supine to sit: Min assist;HOB elevated Sit to supine: Min assist   General bed mobility comments: assist for LLE  Transfers Overall transfer level: Needs assistance Equipment used: Rolling walker (2 wheeled) Transfers: Sit to/from Stand Sit to Stand: Min assist         General transfer comment: assist to rise and steady.  Cues for UE/LE placement  Ambulation/Gait Ambulation/Gait assistance: Mod assist Ambulation Distance (Feet): 16 Feet Assistive device: Rolling walker (2 wheeled) Gait Pattern/deviations: Step-to pattern;Decreased step length - right;Decreased step length - left;Shuffle;Trunk flexed     General Gait Details: cues for sequence, posture and position from RW.  Physical assist for balance, support, RW management and to advance L LE  Stairs            Wheelchair Mobility    Modified Rankin (Stroke Patients Only)       Balance                                             Pertinent Vitals/Pain Pain Assessment: 0-10 Pain Score: 8  Pain Location: L hip Pain Descriptors / Indicators: Aching;Sore Pain Intervention(s): Limited activity within  patient's tolerance;Monitored during session;Premedicated before session;Ice applied    Home Living Family/patient expects to be discharged to:: Skilled nursing facility Living Arrangements: Alone               Additional Comments: pt has a shower chair    Prior Function Level of Independence: Independent               Hand Dominance   Dominant Hand: Right    Extremity/Trunk Assessment   Upper Extremity Assessment: Overall WFL for tasks assessed           Lower Extremity Assessment: LLE deficits/detail   LLE Deficits / Details: Strength at hip 2/5 with AAROM at hip to 80 flex and 15 abd     Communication   Communication: No difficulties  Cognition Arousal/Alertness: Awake/alert Behavior During Therapy: WFL for tasks assessed/performed Overall Cognitive Status: Within Functional Limits for tasks assessed                      General Comments      Exercises Total Joint Exercises Ankle Circles/Pumps: AROM;Both;15 reps;Supine Quad Sets: AROM;Both;10 reps;Supine Heel Slides: AAROM;15 reps;Supine;Left Hip ABduction/ADduction: AAROM;Left;10 reps;Supine      Assessment/Plan    PT Assessment Patient needs continued PT services  PT Diagnosis Difficulty walking   PT Problem List Decreased strength;Decreased range of motion;Decreased activity tolerance;Decreased mobility;Decreased knowledge of use of  DME;Pain  PT Treatment Interventions DME instruction;Gait training;Stair training;Functional mobility training;Therapeutic activities;Therapeutic exercise;Patient/family education   PT Goals (Current goals can be found in the Care Plan section) Acute Rehab PT Goals Patient Stated Goal: decreased pain; independence PT Goal Formulation: With patient Time For Goal Achievement: 07/23/15 Potential to Achieve Goals: Good    Frequency 7X/week   Barriers to discharge        Co-evaluation               End of Session Equipment Utilized During  Treatment: Gait belt Activity Tolerance: Patient limited by pain;Patient limited by fatigue Patient left: in chair;with call bell/phone within reach;with nursing/sitter in room Nurse Communication: Mobility status         Time: 1030-1101 PT Time Calculation (min) (ACUTE ONLY): 31 min   Charges:   PT Evaluation $Initial PT Evaluation Tier I: 1 Procedure PT Treatments $Therapeutic Exercise: 8-22 mins   PT G Codes:        Kristin Pope 08/05/2015, 12:36 PM

## 2015-07-18 NOTE — Evaluation (Signed)
Occupational Therapy Evaluation Patient Details Name: Kristin Pope MRN: [id number] DOB: Oct 15, 1955 Today's Date: 07/18/2015    History of Present Illness s/p L DA THA   Clinical Impression   This 59 year old female was admitted for the above surgery. She will benefit from skilled OT to increase safety and independence with adls.  She was independent prior to admission and now needs up to mod A for adls.  Goals in acute are for min guard to min A level    Follow Up Recommendations  SNF    Equipment Recommendations  3 in 1 bedside comode    Recommendations for Other Services       Precautions / Restrictions Precautions Precautions: Fall Restrictions LLE Weight Bearing: Weight bearing as tolerated      Mobility Bed Mobility Overal bed mobility: Needs Assistance Bed Mobility: Supine to Sit;Sit to Supine     Supine to sit: Min assist Sit to supine: Min assist   General bed mobility comments: assist for LLE  Transfers Overall transfer level: Needs assistance Equipment used: Rolling walker (2 wheeled) Transfers: Sit to/from Stand Sit to Stand: Min assist         General transfer comment: assist to rise and steady.  Cues for UE/LE placement    Balance                                            ADL Overall ADL's : Needs assistance/impaired     Grooming: Wash/dry hands;Set up;Sitting   Upper Body Bathing: Set up;Sitting   Lower Body Bathing: Minimal assistance;With adaptive equipment;Sit to/from stand   Upper Body Dressing : Sitting;Minimal assistance   Lower Body Dressing: Moderate assistance;Sit to/from stand;With adaptive equipment                 General ADL Comments: issued AE and performed ADL from EOB, sit to stand. Donned underwear. Pt is unable to lift LLE and was too painful to cross R underneath to assist.  Pt has R RCT and back issues      Vision     Perception     Praxis      Pertinent Vitals/Pain Pain  Assessment: 0-10 Pain Score: 8  Pain Location: L hip Pain Descriptors / Indicators: Aching Pain Intervention(s): Limited activity within patient's tolerance;Monitored during session;Premedicated before session;Repositioned     Hand Dominance Right   Extremity/Trunk Assessment Upper Extremity Assessment Upper Extremity Assessment: Overall WFL for tasks assessed           Communication Communication Communication: No difficulties   Cognition Arousal/Alertness: Awake/alert Behavior During Therapy: WFL for tasks assessed/performed Overall Cognitive Status: Within Functional Limits for tasks assessed                     General Comments       Exercises       Shoulder Instructions      Home Living Family/patient expects to be discharged to:: Skilled nursing facility Living Arrangements: Alone                               Additional Comments: pt has a shower chair      Prior Functioning/Environment Level of Independence: Independent             OT Diagnosis: Acute pain;Generalized weakness  OT Problem List: Decreased strength;Decreased activity tolerance;Decreased knowledge of use of DME or AE;Pain   OT Treatment/Interventions: Self-care/ADL training;DME and/or AE instruction;Patient/family education    OT Goals(Current goals can be found in the care plan section) Acute Rehab OT Goals Patient Stated Goal: decreased pain; independence OT Goal Formulation: With patient Time For Goal Achievement: 07/25/15 Potential to Achieve Goals: Good ADL Goals Pt Will Transfer to Toilet: with min guard assist;ambulating;bedside commode Pt Will Perform Toileting - Clothing Manipulation and hygiene: with min guard assist;sit to/from stand Additional ADL Goal #1: pt will perform LB dressing with min A with AE, sit to stand  OT Frequency: Min 2X/week   Barriers to D/C:            Co-evaluation              End of Session    Activity  Tolerance: No increased pain Patient left: in bed;with call bell/phone within reach   Time: 0922-0954 OT Time Calculation (min): 32 min Charges:  OT General Charges $OT Visit: 1 Procedure OT Evaluation $Initial OT Evaluation Tier I: 1 Procedure OT Treatments $Self Care/Home Management : 8-22 mins G-Codes:    Teondre Jarosz 08-14-15, 10:20 AM Lesle Chris, OTR/L [phone] 08-14-15

## 2015-07-18 NOTE — Clinical Social Work Placement (Signed)
   CLINICAL SOCIAL WORK PLACEMENT  NOTE  Date:  07/18/2015  Patient Details  Name: Kristin Pope MRN: [id number] Date of Birth: [birth year]  Clinical Social Work is seeking post-discharge placement for this patient at the [place] level of care (*CSW will initial, date and re-position this form in  chart as items are completed):  Yes   Patient/family provided with [place] Work Department's list of facilities offering this level of care within the geographic area requested by the patient (or if unable, by the patient's family).  Yes   Patient/family informed of their freedom to choose among providers that offer the needed level of care, that participate in Medicare, Medicaid or managed care program needed by the patient, have an available bed and are willing to accept the patient.  Yes   Patient/family informed of [facility]'s ownership interest in [facility] and [facility], as well as of the fact that they are under no obligation to receive care at these facilities.  PASRR submitted to EDS on 07/18/15     PASRR number received on 07/18/15     Existing PASRR number confirmed on       FL2 transmitted to all facilities in geographic area requested by pt/family on 07/18/15     FL2 transmitted to all facilities within larger geographic area on       Patient informed that his/her managed care company has contracts with or will negotiate with certain facilities, including the following:        Yes   Patient/family informed of bed offers received.  Patient chooses bed at       Physician recommends and patient chooses bed at      Patient to be transferred to   on  .  Patient to be transferred to facility by       Patient family notified on   of transfer.  Name of family member notified:        PHYSICIAN       Additional Comment:    _______________________________________________ Luretha Rued, [place] 07/18/2015, 2:17 PM

## 2015-07-18 NOTE — Progress Notes (Signed)
Utilization review completed.  

## 2015-07-18 NOTE — Clinical Social Work Note (Signed)
Clinical Social Work Assessment  Patient Details  Name: Kristin Pope MRN: [id number] Date of Birth: [birth year]  Date of referral:  07/18/15               Reason for consult:  Facility Placement, Discharge Planning                Permission sought to share information with:  Facility Art therapist granted to share information::  Yes, Verbal Permission Granted  Name::        Agency::     Relationship::     Contact Information:     Housing/Transportation Living arrangements for the past 2 months:  Single Family Home Source of Information:  Patient Patient Interpreter Needed:  None Criminal Activity/Legal Involvement Pertinent to Current Situation/Hospitalization:  No - Comment as needed Significant Relationships:  Parents Lives with:  Self Do you feel safe going back to the place where you live?   (ST Rehab is needed.) Need for family participation in patient care:  No (Coment)  Care giving concerns:  Pt's care cannot be managed at home following hospital d/c.   Social Worker assessment / plan:  Pt hospitalized on 07/17/15 for pre planned left total hip arthroplasty. PT has recommended ST Rehab following hospital d/c. Pt is in agreement with this plan. SNF search has been initiated and bed offers provided. CSW will meet with pt again this afternoon to continue assisting with d/c planning.  Employment status:  Unemployed Forensic scientist:  Medicaid In [place] PT Recommendations:  [place] / Referral to community resources:  [place]  Patient/Family's Response to care: Pt feels ST rehab is needed.  Patient/Family's Understanding of and Emotional Response to Diagnosis, Current Treatment, and Prognosis:  Pt is aware of her medical status. She is motivated to work with therapy. Emotional Assessment Appearance:  Appears stated age Attitude/Demeanor/Rapport:  Other (cooperative) Affect (typically observed):  Calm,  Pleasant Orientation:  Oriented to Self, Oriented to Place, Oriented to  Time, Oriented to Situation Alcohol / Substance use:  Not Applicable Psych involvement (Current and /or in the community):  No (Comment)  Discharge Needs  Concerns to be addressed:  Discharge Planning Concerns Readmission within the last 30 days:  No Current discharge risk:  None Barriers to Discharge:  No Barriers Identified   Luretha Rued, [place] 07/18/2015, 2:09 PM

## 2015-07-18 NOTE — Progress Notes (Signed)
     Subjective: 1 Day Post-Op Procedure(s) (LRB): LEFT TOTAL HIP ARTHROPLASTY ANTERIOR APPROACH (Left)   Patient reports pain as severe, pain not well controlled on Norco.  Changed to Dilaudid since she says she can't do Oxycodone. She states that her pain is not only her hip, but also from her back.  Explained that we will treat the pain with regards to the hp and the recovery, but afterwards she will need to return to her pain medicine doctor for further analgesic medications.  States that she understands. No events throughout the night. Planning on SN upon discharge.   Objective:   VITALS:   Filed Vitals:   07/18/15 0604  BP: 105/45  Pulse: 81  Temp: 98.9 F (37.2 C)  Resp: 16    Dorsiflexion/Plantar flexion intact Incision: dressing C/D/I No cellulitis present Compartment soft  LABS  Recent Labs  07/18/15 0415  HGB 10.8*  HCT 33.2*  WBC 10.2  PLT 312     Recent Labs  07/18/15 0415  NA 138  K 4.4  BUN 10  CREATININE 0.62  GLUCOSE 134*     Assessment/Plan: 1 Day Post-Op Procedure(s) (LRB): LEFT TOTAL HIP ARTHROPLASTY ANTERIOR APPROACH (Left) Foley cath d/c'ed Advance diet Up with therapy D/C IV fluids Discharge to SNF eventually, when ready  Obese (BMI 30-39.9) Estimated body mass index is 34.49 kg/(m^2) as calculated from the following:   Height as of this encounter: 4\' 10"  (1.473 m).   Weight as of this encounter: 74.844 kg (165 lb). Patient also counseled that weight may inhibit the healing process Patient counseled that losing weight will help with future health issues      Kristin Pope   PAC  07/18/2015, 9:31 AM

## 2015-07-18 NOTE — Progress Notes (Signed)
Physical Therapy Treatment Patient Details Name: Kristin Pope MRN: [id number] DOB: [birth year] Today's Date: 07-24-15    History of Present Illness s/p L DA THA    PT Comments    Pt progressing steadily with mobility - continues ltd by pain/fatigue  Follow Up Recommendations  SNF     Equipment Recommendations  None recommended by PT    Recommendations for Other Services OT consult     Precautions / Restrictions Precautions Precautions: Fall Restrictions Weight Bearing Restrictions: No LLE Weight Bearing: Weight bearing as tolerated    Mobility  Bed Mobility Overal bed mobility: Needs Assistance Bed Mobility: Sit to Supine       Sit to supine: Min assist   General bed mobility comments: assist for LLE  Transfers Overall transfer level: Needs assistance Equipment used: Rolling walker (2 wheeled) Transfers: Sit to/from Stand Sit to Stand: Min assist         General transfer comment: assist to rise and steady.  Cues for UE/LE placement  Ambulation/Gait Ambulation/Gait assistance: Min assist Ambulation Distance (Feet): 20 Feet (twice) Assistive device: Rolling walker (2 wheeled) Gait Pattern/deviations: Step-to pattern;Step-through pattern;Decreased step length - right;Decreased step length - left;Shuffle;Trunk flexed Gait velocity: decr   General Gait Details: cues for sequence, posture and position from RW.  Physical assist for balance, support, RW management and to advance L LE   Stairs            Wheelchair Mobility    Modified Rankin (Stroke Patients Only)       Balance                                    Cognition Arousal/Alertness: Awake/alert Behavior During Therapy: WFL for tasks assessed/performed Overall Cognitive Status: Within Functional Limits for tasks assessed                      Exercises      General Comments        Pertinent Vitals/Pain Pain Assessment: 0-10 Pain Score: 8  Pain  Location: L hip Pain Descriptors / Indicators: Aching;Sore Pain Intervention(s): Limited activity within patient's tolerance;Monitored during session;Premedicated before session;Ice applied    Home Living                      Prior Function            PT Goals (current goals can now be found in the care plan section) Acute Rehab PT Goals Patient Stated Goal: decreased pain; independence PT Goal Formulation: With patient Time For Goal Achievement: 07/23/15 Potential to Achieve Goals: Good Progress towards PT goals: Progressing toward goals    Frequency  7X/week    PT Plan Current plan remains appropriate    Co-evaluation             End of Session Equipment Utilized During Treatment: Gait belt Activity Tolerance: Patient limited by pain;Patient limited by fatigue Patient left: in bed;with call bell/phone within reach     Time: 1312-1337 PT Time Calculation (min) (ACUTE ONLY): 25 min  Charges:  $Gait Training: 23-37 mins                    G Codes:      Treveon Bourcier 24-Jul-2015, 4:32 PM

## 2015-07-19 DIAGNOSIS — E669 Obesity, unspecified: Secondary | ICD-10-CM

## 2015-07-19 HISTORY — DX: Obesity, unspecified: E66.9

## 2015-07-19 LAB — BASIC METABOLIC PANEL
Anion gap: 6 (ref 5–15)
BUN: 14 mg/dL (ref 6–20)
CHLORIDE: 104 mmol/L (ref 101–111)
CO2: 31 mmol/L (ref 22–32)
CREATININE: 0.63 mg/dL (ref 0.44–1.00)
Calcium: 9.3 mg/dL (ref 8.9–10.3)
GFR calc Af Amer: >60 mL/min (ref >60)
GFR calc non Af Amer: >60 mL/min (ref >60)
GLUCOSE: 111 mg/dL — AB (ref 65–99)
Potassium: 4.2 mmol/L (ref 3.5–5.1)
SODIUM: 141 mmol/L (ref 135–145)

## 2015-07-19 LAB — CBC
HCT: 30.7 % — ABNORMAL LOW (ref 36.0–46.0)
Hemoglobin: 9.9 g/dL — ABNORMAL LOW (ref 12.0–15.0)
MCH: 31.6 pg (ref 26.0–34.0)
MCHC: 32.2 g/dL (ref 30.0–36.0)
MCV: 98.1 fL (ref 78.0–100.0)
Platelets: 310 10*3/uL (ref 150–400)
RBC: 3.13 MIL/uL — ABNORMAL LOW (ref 3.87–5.11)
RDW: 14.2 % (ref 11.5–15.5)
WBC: 9 10*3/uL (ref 4.0–10.5)

## 2015-07-19 MED ORDER — POLYETHYLENE GLYCOL 3350 17 G PO PACK: 17.0000 g | PACK | Freq: Two times a day (BID) | ORAL | Status: DC

## 2015-07-19 MED ORDER — CLONAZEPAM 1 MG PO TABS: 1.0000 mg | ORAL_TABLET | Freq: Four times a day (QID) | ORAL | Status: DC

## 2015-07-19 MED ORDER — HYDROMORPHONE HCL 2 MG PO TABS: 2.0000 mg | ORAL_TABLET | ORAL | Status: DC | PRN

## 2015-07-19 MED ORDER — DOCUSATE SODIUM 100 MG PO CAPS: 100.0000 mg | ORAL_CAPSULE | Freq: Two times a day (BID) | ORAL | Status: DC

## 2015-07-19 MED ORDER — ASPIRIN 325 MG PO TBEC: 325.0000 mg | DELAYED_RELEASE_TABLET | Freq: Two times a day (BID) | ORAL | Status: DC

## 2015-07-19 MED ORDER — FERROUS SULFATE 325 (65 FE) MG PO TABS: 325.0000 mg | ORAL_TABLET | Freq: Three times a day (TID) | ORAL | Status: DC

## 2015-07-19 NOTE — Progress Notes (Signed)
Discharged from floor via w/c, tranporting to [facility] via private auto. Belongings & mother with pt. No changes in assessment. Gregoire Bennis, [employer]

## 2015-07-19 NOTE — Progress Notes (Signed)
Physical Therapy Treatment Patient Details Name: ZAHRIYAH JOO MRN: [id number] DOB: 28-Jun-1956 Today's Date: 07/19/2015    History of Present Illness s/p L DA THA    PT Comments    POD # 2 pm session.  Assisted pt OOB to amb to BR then back to bed due to pain leval and fatigue.  Applied ICE to L hip.   Follow Up Recommendations  SNF ([facility])     Equipment Recommendations       Recommendations for Other Services       Precautions / Restrictions Precautions Precautions: Fall Restrictions Weight Bearing Restrictions: No LLE Weight Bearing: Weight bearing as tolerated    Mobility  Bed Mobility Overal bed mobility: Needs Assistance Bed Mobility: Supine to Sit     Supine to sit: Min assist;HOB elevated     General bed mobility comments: assist for LLE plus increased time  Transfers Overall transfer level: Needs assistance Equipment used: Rolling walker (2 wheeled) Transfers: Sit to/from Stand Sit to Stand: Min assist         General transfer comment: assist to rise and steady.  Cues for UE/LE placement  Ambulation/Gait Ambulation/Gait assistance: Min assist Ambulation Distance (Feet): 220 Feet Assistive device: Rolling walker (2 wheeled) Gait Pattern/deviations: Step-to pattern;Decreased stance time - left;Trunk flexed Gait velocity: decr   General Gait Details: cues for sequence, posture and position from RW.  Physical assist for balance, support, RW management and to advance L LE.  increased time   Stairs            Wheelchair Mobility    Modified Rankin (Stroke Patients Only)       Balance                                    Cognition Arousal/Alertness: Awake/alert Behavior During Therapy: WFL for tasks assessed/performed Overall Cognitive Status: Within Functional Limits for tasks assessed                      Exercises      General Comments        Pertinent Vitals/Pain Pain Assessment:  0-10 Pain Score: 5  Pain Location: L hip Pain Descriptors / Indicators: Sore;Tightness Pain Intervention(s): Monitored during session;Premedicated before session;Repositioned;Ice applied    Home Living                      Prior Function            PT Goals (current goals can now be found in the care plan section) Progress towards PT goals: Progressing toward goals    Frequency  7X/week    PT Plan Current plan remains appropriate    Co-evaluation             End of Session Equipment Utilized During Treatment: Gait belt Activity Tolerance: No increased pain;Patient limited by fatigue Patient left: in chair;with call bell/phone within reach     Time: 1353-1414 PT Time Calculation (min) (ACUTE ONLY): 21 min  Charges:  $Gait Training: 8-22 mins                     G Codes:      Rica Koyanagi  PTA [facility]  Acute  Rehab Pager      [phone]

## 2015-07-19 NOTE — Progress Notes (Signed)
     Subjective: 2 Days Post-Op Procedure(s) (LRB): LEFT TOTAL HIP ARTHROPLASTY ANTERIOR APPROACH (Left)   Patient reports pain as mild, pain better controlled. No events throughout the night.  Discussed with Dr. Alvan Dame regarding having the other hip replaced.   Objective:   VITALS:   Filed Vitals:   07/19/15 0536  BP: 137/74  Pulse: 75  Temp: 98.8 F (37.1 C)  Resp: 14    Dorsiflexion/Plantar flexion intact Incision: dressing C/D/I No cellulitis present Compartment soft  LABS  Recent Labs  07/18/15 0415 07/19/15 0450  HGB 10.8* 9.9*  HCT 33.2* 30.7*  WBC 10.2 9.0  PLT 312 310     Recent Labs  07/18/15 0415 07/19/15 0450  NA 138 141  K 4.4 4.2  BUN 10 14  CREATININE 0.62 0.63  GLUCOSE 134* 111*     Assessment/Plan: 2 Days Post-Op Procedure(s) (LRB): LEFT TOTAL HIP ARTHROPLASTY ANTERIOR APPROACH (Left) Up with therapy Discharge to SNF when ready Follow up in 2 weeks at [facility]. Follow up with OLIN,Alsie Younes D in 2 weeks.  Contact information:  [facility] [address] [place] [phone]    Obese (BMI 30-39.9) Estimated body mass index is 34.49 kg/(m^2) as calculated from the following:   Height as of this encounter: 4\' 10"  (1.473 m).   Weight as of this encounter: 74.844 kg (165 lb). Patient also counseled that weight may inhibit the healing process Patient counseled that losing weight will help with future health issues      West Pugh. Shaniquia Brafford   PAC  07/19/2015, 9:47 AM

## 2015-07-19 NOTE — Discharge Summary (Signed)
Physician Discharge Summary  Patient ID: Kristin Pope MRN: [id number] DOB/AGE: October 11, 1955 59 y.o.  Admit date: 07/17/2015 Discharge date:   07/19/2015  Procedures:  Procedure(s) (LRB): LEFT TOTAL HIP ARTHROPLASTY ANTERIOR APPROACH (Left)  Attending Physician:  Dr. Paralee Cancel   Admission Diagnoses:   Left hip primary OA / pain  Discharge Diagnoses:  Principal Problem:   S/P left THA, AA Active Problems:   Obese  Past Medical History  Diagnosis Date  . Allergy   . Depression   . Fibromyalgia   . Hypertension   . Hyperlipidemia   . Low back pain   . Family history of adverse reaction to anesthesia     sister has problems waking up  . Vaginitis     atropic-ongoing abnormal vaginal bleeding-had ultrasound and biopsy in last couple months  . Chronic fatigue   . Headache     constant headaches  . Arthritis     neck and spine, with bone spurs  . Torn rotator cuff     right shoulder    HPI:    Kristin Pope, 59 y.o. female, has a history of pain and functional disability in the left hip(s) due to arthritis and patient has failed non-surgical conservative treatments for greater than 12 weeks to include NSAID's and/or analgesics, corticosteriod injections and activity modification. Onset of symptoms was gradual starting years ago with gradually worsening course since that time.The patient noted no past surgery on the left hip(s). Patient currently rates pain in the left hip at 10 out of 10 with activity. Patient has worsening of pain with activity and weight bearing, trendelenberg gait, pain that interfers with activities of daily living and pain with passive range of motion. Patient has evidence of periarticular osteophytes and joint space narrowing by imaging studies. This condition presents safety issues increasing the risk of falls. There is no current active infection. Risks, benefits and expectations were discussed with the patient. Risks including but not  limited to the risk of anesthesia, blood clots, nerve damage, blood vessel damage, failure of the prosthesis, infection and up to and including death. Patient understand the risks, benefits and expectations and wishes to proceed with surgery.   PCP: Marijean Bravo, MD   Discharged Condition: good  Hospital Course:  Patient underwent the above stated procedure on 07/17/2015. Patient tolerated the procedure well and brought to the recovery room in good condition and subsequently to the floor.  POD #1 BP: 105/45 ; Pulse: 81 ; Temp: 98.9 F (37.2 C) ; Resp: 16 Patient reports pain as severe, pain not well controlled on Norco. Changed to Dilaudid since she says she can't do Oxycodone. She states that her pain is not only her hip, but also from her back. Explained that we will treat the pain with regards to the hp and the recovery, but afterwards she will need to return to her pain medicine doctor for further analgesic medications. States that she understands. No events throughout the night. Planning on SN upon discharge. Dorsiflexion/plantar flexion intact, incision: dressing C/D/I, no cellulitis present and compartment soft.   LABS  Basename    HGB  10.8  HCT  33.2   POD #2  BP: 137/74 ; Pulse: 75 ; Temp: 98.8 F (37.1 C) ; Resp: 14 Patient reports pain as mild, pain better controlled. No events throughout the night. Discussed with Dr. Alvan Dame regarding having the other hip replaced.  Dorsiflexion/plantar flexion intact, incision: dressing C/D/I, no cellulitis present and compartment soft.  LABS  Basename    HGB  9.9  HCT  30.7     Discharge Exam: General appearance: alert, cooperative and no distress Extremities: Homans sign is negative, no sign of DVT, no edema, redness or tenderness in the calves or thighs and no ulcers, gangrene or trophic changes  Disposition:     Skilled nursing facility with follow up in 2 weeks   Follow-up Information    Follow up with [name],  MD. Schedule an appointment as soon as possible for a visit in 2 weeks.   Specialty:  Orthopedic Surgery   Contact information:   [address] [place] [address] [phone]           Discharge Instructions    Call MD / Call 911    Complete by:  As directed   If you experience chest pain or shortness of breath, CALL 911 and be transported to the hospital emergency room.  If you develope a fever above 101 F, pus (white drainage) or increased drainage or redness at the wound, or calf pain, call your surgeon's office.     Change dressing    Complete by:  As directed   Maintain surgical dressing until follow up in the clinic. If the edges start to pull up, may reinforce with tape. If the dressing is no longer working, may remove and cover with gauze and tape, but must keep the area dry and clean.  Call with any questions or concerns.     Constipation Prevention    Complete by:  As directed   Drink plenty of fluids.  Prune juice may be helpful.  You may use a stool softener, such as Colace (over the counter) 100 mg twice a day.  Use MiraLax (over the counter) for constipation as needed.     Diet - low sodium heart healthy    Complete by:  As directed      Discharge instructions    Complete by:  As directed   Maintain surgical dressing until follow up in the clinic. If the edges start to pull up, may reinforce with tape. If the dressing is no longer working, may remove and cover with gauze and tape, but must keep the area dry and clean.  Follow up in 2 weeks at [facility]. Call with any questions or concerns.     Increase activity slowly as tolerated    Complete by:  As directed   Weight bearing as tolerated with assist device (walker, cane, etc) as directed, use it as long as suggested by your surgeon or therapist, typically at least 4-6 weeks.     TED hose    Complete by:  As directed   Use stockings (TED hose) for 2 weeks on both leg(s).  You may remove  them at night for sleeping.             Medication List    STOP taking these medications        HYDROcodone-acetaminophen 10-325 MG tablet  Commonly known as:  NORCO      TAKE these medications        acetaminophen 500 MG tablet  Commonly known as:  TYLENOL  Take 500 mg by mouth every 6 (six) hours as needed for mild pain.     ALPHA LIPOIC ACID PO  Take 1 tablet by mouth daily.     aspirin 325 MG EC tablet  Take 1 tablet (325 mg total) by mouth 2 (two)  times daily.     azelastine 0.1 % nasal spray  Commonly known as:  ASTELIN  Place 1 spray into the nose 2 (two) times daily as needed. Use in each nostril as directed     budesonide-formoterol 160-4.5 MCG/ACT inhaler  Commonly known as:  SYMBICORT  Inhale 2 puffs into the lungs 2 (two) times daily as needed (FOR WHEEZING).     cholecalciferol 1000 UNITS tablet  Commonly known as:  VITAMIN D  Take 1,000 Units by mouth daily.     clonazePAM 1 MG tablet  Commonly known as:  KLONOPIN  Take 1 tablet (1 mg total) by mouth 4 (four) times daily.     conjugated estrogens vaginal cream  Commonly known as:  PREMARIN  Place 1 Applicatorful vaginally 2 (two) times a week.     docusate sodium 100 MG capsule  Commonly known as:  COLACE  Take 1 capsule (100 mg total) by mouth 2 (two) times daily.     doxepin 50 MG capsule  Commonly known as:  SINEQUAN  Take 50 mg by mouth 2 (two) times daily as needed (for sleep).     DULoxetine 60 MG capsule  Commonly known as:  CYMBALTA  Take 1 capsule (60 mg total) by mouth daily.     estazolam 2 MG tablet  Commonly known as:  PROSOM  Take 1 tablet (2 mg total) by mouth at bedtime.     FA-Vitamin B-6-Vitamin B-12 2.2-25-0.5 MG Tabs  Take 1 tablet by mouth 2 (two) times daily.     ferrous sulfate 325 (65 FE) MG tablet  Take 1 tablet (325 mg total) by mouth 3 (three) times daily after meals.     fexofenadine 180 MG tablet  Commonly known as:  ALLEGRA  Take 180 mg by mouth daily  as needed for allergies.     furosemide 20 MG tablet  Commonly known as:  LASIX  Take 20 mg by mouth every Monday, Wednesday, and Friday.     hydrocortisone cream 1 %  Apply 1 application topically as directed.     HYDROmorphone 2 MG tablet  Commonly known as:  DILAUDID  Take 1-2 tablets (2-4 mg total) by mouth every 4 (four) hours as needed for severe pain.     lactase 3000 UNITS tablet  Commonly known as:  LACTAID  Take 1 tablet by mouth daily.     loperamide 2 MG capsule  Commonly known as:  IMODIUM  Take 2 mg by mouth as needed for diarrhea or loose stools.     lubiprostone 24 MCG capsule  Commonly known as:  AMITIZA  Take 1 capsule (24 mcg total) by mouth daily with breakfast.     medroxyPROGESTERone 400 MG/ML Susp injection  Commonly known as:  DEPO-PROVERA  Inject 400 mg into the muscle once.     montelukast 10 MG tablet  Commonly known as:  SINGULAIR  Take 10 mg by mouth daily.     nadolol 40 MG tablet  Commonly known as:  CORGARD  TAKE 1 TABLET EVERY DAY     nitrofurantoin 100 MG capsule  Commonly known as:  MACRODANTIN  Take 100 mg by mouth at bedtime.     NUVIGIL 150 MG tablet  Generic drug:  Armodafinil  TAKE 1 TABLET BY MOUTH EVERY DAY     omeprazole 40 MG capsule  Commonly known as:  PRILOSEC  Take 40 mg by mouth daily.     OVER THE COUNTER MEDICATION  Take 1  tablet by mouth daily. NATURES PLUS ENERGY     polyethylene glycol packet  Commonly known as:  MIRALAX / GLYCOLAX  Take 17 g by mouth 2 (two) times daily.     potassium chloride 8 MEQ tablet  Commonly known as:  KLOR-CON  1 tablet daily     pseudoephedrine-acetaminophen 30-500 MG Tabs tablet  Commonly known as:  TYLENOL SINUS  Take 1 tablet by mouth every 4 (four) hours as needed (FOR COLD).     ranitidine 150 MG tablet  Commonly known as:  ZANTAC  Take 150 mg by mouth daily as needed for heartburn.     tiZANidine 4 MG tablet  Commonly known as:  ZANAFLEX  Take 1 tablet (4 mg  total) by mouth every 8 (eight) hours as needed.     VITAMIN B 12 PO  Take 5,000 mg by mouth daily.     Vitamin D (Ergocalciferol) 50000 UNITS Caps capsule  Commonly known as:  DRISDOL  Take 50,000 Units by mouth every 7 (seven) days.     zolpidem 10 MG tablet  Commonly known as:  AMBIEN  Take 10 mg by mouth at bedtime as needed for sleep.         Signed: West Pugh. Ryhanna Dunsmore   PA-C  07/19/2015, 11:29 AM

## 2015-07-19 NOTE — Clinical Social Work Placement (Signed)
   CLINICAL SOCIAL WORK PLACEMENT  NOTE  Date:  07/19/2015  Patient Details  Name: Kristin Pope MRN: [id number] Date of Birth: 09/30/1955  Clinical Social Work is seeking post-discharge placement for this patient at the [place] level of care (*CSW will initial, date and re-position this form in  chart as items are completed):  Yes   Patient/family provided with [facility] Work Department's list of facilities offering this level of care within the geographic area requested by the patient (or if unable, by the patient's family).  Yes   Patient/family informed of their freedom to choose among providers that offer the needed level of care, that participate in Medicare, Medicaid or managed care program needed by the patient, have an available bed and are willing to accept the patient.  Yes   Patient/family informed of [facility]'s ownership interest in [facility] and [facility], as well as of the fact that they are under no obligation to receive care at these facilities.  PASRR submitted to EDS on 07/18/15     PASRR number received on 07/18/15     Existing PASRR number confirmed on       FL2 transmitted to all facilities in geographic area requested by pt/family on 07/18/15     FL2 transmitted to all facilities within larger geographic area on       Patient informed that his/her managed care company has contracts with or will negotiate with certain facilities, including the following:        Yes   Patient/family informed of bed offers received.  Patient chooses bed at [facility]     Physician recommends and patient chooses bed at      Patient to be transferred to [facility] on 07/19/15.  Patient to be transferred to facility by [facility]     Patient family notified on 07/19/15 of transfer.  Name of family member notified:  Pt called family directly.     PHYSICIAN       Additional Comment: Pt  is in agreement with d/c to [facility] today. [facility] provide a 30 day LOG. PT approved transport by car. NSG reviewed d/c summary, scripts, avs. Scripts included in d/c packet. D/c summary sent to SNF prior to d/c for review. D/c packet provided to pt prior to d/c.   _______________________________________________ Luretha Rued, [place] 07/19/2015, 2:56 PM

## 2015-07-19 NOTE — Progress Notes (Signed)
Physical Therapy Treatment Patient Details Name: Kristin Pope MRN: [id number] DOB: 07/07/1956 Today's Date: 07/19/2015    History of Present Illness s/p L DA THA    PT Comments    POD # 2 am session.  Assisted OOB to bathroom then in hallway.  Very slow gait with difficulty advancing L LE due to pain.  Positioned in recliner and applied ICE.  Pt progressing slowly and will need ST Rehab at SNF.   Follow Up Recommendations  SNF ([facility])     Equipment Recommendations       Recommendations for Other Services       Precautions / Restrictions Precautions Precautions: Fall Restrictions Weight Bearing Restrictions: No LLE Weight Bearing: Weight bearing as tolerated    Mobility  Bed Mobility Overal bed mobility: Needs Assistance Bed Mobility: Supine to Sit     Supine to sit: Min assist;HOB elevated     General bed mobility comments: assist for LLE plus increased time  Transfers Overall transfer level: Needs assistance Equipment used: Rolling walker (2 wheeled) Transfers: Sit to/from Stand Sit to Stand: Min assist         General transfer comment: assist to rise and steady.  Cues for UE/LE placement  Ambulation/Gait Ambulation/Gait assistance: Min assist Ambulation Distance (Feet): 23 Feet Assistive device: Rolling walker (2 wheeled) Gait Pattern/deviations: Step-to pattern;Decreased stance time - left;Trunk flexed Gait velocity: decr   General Gait Details: cues for sequence, posture and position from RW.  Physical assist for balance, support, RW management and to advance L LE.  increased time   Stairs            Wheelchair Mobility    Modified Rankin (Stroke Patients Only)       Balance                                    Cognition Arousal/Alertness: Awake/alert Behavior During Therapy: WFL for tasks assessed/performed Overall Cognitive Status: Within Functional Limits for tasks assessed                       Exercises      General Comments        Pertinent Vitals/Pain Pain Assessment: 0-10 Pain Score: 5  Pain Location: L hip Pain Descriptors / Indicators: Sore;Tightness Pain Intervention(s): Monitored during session;Premedicated before session;Repositioned;Ice applied    Home Living                      Prior Function            PT Goals (current goals can now be found in the care plan section) Progress towards PT goals: Progressing toward goals    Frequency  7X/week    PT Plan Current plan remains appropriate    Co-evaluation             End of Session Equipment Utilized During Treatment: Gait belt Activity Tolerance: No increased pain;Patient limited by fatigue Patient left: in chair;with call bell/phone within reach     Time: 0900-0925 PT Time Calculation (min) (ACUTE ONLY): 25 min  Charges:  $Gait Training: 8-22 mins $Therapeutic Activity: 8-22 mins                    G Codes:      Rica Koyanagi  PTA [facility]  Acute  Rehab Pager      [phone]

## 2015-07-19 NOTE — Plan of Care (Signed)
Problem: Discharge Progression Outcomes Goal: Anticoagulant follow-up in place Outcome: Not Applicable Date Met:  09/44/61 asa Goal: Ambulates safely using assistive device Outcome: Adequate for Discharge To SNF for rehab

## 2015-07-23 ENCOUNTER — Non-Acute Institutional Stay (SKILLED_NURSING_FACILITY): Payer: Medicaid Other | Admitting: Internal Medicine

## 2015-07-23 ENCOUNTER — Encounter: Payer: Self-pay | Admitting: Internal Medicine

## 2015-07-23 DIAGNOSIS — G8929 Other chronic pain: Secondary | ICD-10-CM | POA: Insufficient documentation

## 2015-07-23 DIAGNOSIS — K219 Gastro-esophageal reflux disease without esophagitis: Secondary | ICD-10-CM

## 2015-07-23 DIAGNOSIS — Z96649 Presence of unspecified artificial hip joint: Secondary | ICD-10-CM

## 2015-07-23 DIAGNOSIS — I10 Essential (primary) hypertension: Secondary | ICD-10-CM

## 2015-07-23 DIAGNOSIS — F339 Major depressive disorder, recurrent, unspecified: Secondary | ICD-10-CM

## 2015-07-23 DIAGNOSIS — Z9660 Presence of unspecified orthopedic joint implant: Secondary | ICD-10-CM

## 2015-07-23 HISTORY — DX: Gastro-esophageal reflux disease without esophagitis: K21.9

## 2015-07-23 NOTE — Assessment & Plan Note (Signed)
Reported conversation that ortho will tx hip pain but chronic back pain needs to be handled by pain clinic; SNF - agree

## 2015-07-23 NOTE — Assessment & Plan Note (Signed)
SNF - dstable, cont nadalol and lasix

## 2015-07-23 NOTE — Assessment & Plan Note (Signed)
SNF - cont 40 mg daily prilosec with zantac 150 mg prn

## 2015-07-23 NOTE — Progress Notes (Signed)
MRN: [id number] Name: CODIE HAINER  Sex: female Age: 59 y.o. DOB: 02/08/1956  [place] #: [name] Facility/Room:129 Level Of Care: SNF Provider: Inocencio Homes D Emergency Contacts: Extended Emergency Contact Information Primary Emergency Contact: Leviner,Joyce Address: [address]          [place], [place] [address] [place] Phone: [phone] Relation: Mother  Code Status:   Allergies: Morphine and related; Codeine; Glutethimides; Lactose intolerance (gi); Lipitor; Tetracycline; Tramadol; and Hydrocodone-acetaminophen  Chief Complaint  Patient presents with  . New Admit To SNF    HPI: Patient is 59 y.o. female with depression, HTN,HLD, LBP and arthritis who was admitted to hospital from 10/18-20 for total L hip arthroplasty. Pt is admitted to SNF for  OT/PT. While at SNF pt will be followed for GERD, tx with prilosec, HTN, tx with nadalol and lasix, and depression , tx with cymbalata.  Past Medical History  Diagnosis Date  . Allergy   . Depression   . Fibromyalgia   . Hypertension   . Hyperlipidemia   . Low back pain   . Family history of adverse reaction to anesthesia     sister has problems waking up  . Vaginitis     atropic-ongoing abnormal vaginal bleeding-had ultrasound and biopsy in last couple months  . Chronic fatigue   . Headache     constant headaches  . Arthritis     neck and spine, with bone spurs  . Torn rotator cuff     right shoulder    Past Surgical History  Procedure Laterality Date  . Hysteroscopy  01/2011  . Radial optic neurotomy      twice in lumbar area of back-every 6 months  . Colonoscopy w/ polypectomy    . Breast surgery      breast biopsy-benign  . Dilation and curettage of uterus    . Total hip arthroplasty Left 07/17/2015    Procedure: LEFT TOTAL HIP ARTHROPLASTY ANTERIOR APPROACH;  Surgeon: Paralee Cancel, MD;  Location: [facility];  Service: Orthopedics;  Laterality: Left;      Medication List        This list is accurate as of: 07/23/15 11:59 PM.  Always use your most recent med list.               acetaminophen 500 MG tablet  Commonly known as:  TYLENOL  Take 500 mg by mouth every 6 (six) hours as needed for mild pain.     ALPHA LIPOIC ACID PO  Take 1 tablet by mouth daily.     aspirin 325 MG EC tablet  Take 1 tablet (325 mg total) by mouth 2 (two) times daily.     azelastine 0.1 % nasal spray  Commonly known as:  ASTELIN  Place 1 spray into the nose 2 (two) times daily as needed. Use in each nostril as directed     budesonide-formoterol 160-4.5 MCG/ACT inhaler  Commonly known as:  SYMBICORT  Inhale 2 puffs into the lungs 2 (two) times daily as needed (FOR WHEEZING).     cholecalciferol 1000 UNITS tablet  Commonly known as:  VITAMIN D  Take 1,000 Units by mouth daily.     clonazePAM 1 MG tablet  Commonly known as:  KLONOPIN  Take 1 tablet (1 mg total) by mouth 4 (four) times daily.     conjugated estrogens vaginal cream  Commonly known as:  PREMARIN  Place 1 Applicatorful vaginally 2 (two) times a week.  docusate sodium 100 MG capsule  Commonly known as:  COLACE  Take 1 capsule (100 mg total) by mouth 2 (two) times daily.     doxepin 50 MG capsule  Commonly known as:  SINEQUAN  Take 50 mg by mouth 2 (two) times daily as needed (for sleep).     DULoxetine 60 MG capsule  Commonly known as:  CYMBALTA  Take 1 capsule (60 mg total) by mouth daily.     estazolam 2 MG tablet  Commonly known as:  PROSOM  Take 1 tablet (2 mg total) by mouth at bedtime.     FA-Vitamin B-6-Vitamin B-12 2.2-25-0.5 MG Tabs  Take 1 tablet by mouth 2 (two) times daily.     ferrous sulfate 325 (65 FE) MG tablet  Take 1 tablet (325 mg total) by mouth 3 (three) times daily after meals.     fexofenadine 180 MG tablet  Commonly known as:  ALLEGRA  Take 180 mg by mouth daily as needed for allergies.     furosemide 20 MG tablet  Commonly known as:  LASIX  Take 20 mg by mouth every  Monday, Wednesday, and Friday.     hydrocortisone cream 1 %  Apply 1 application topically as directed.     HYDROmorphone 2 MG tablet  Commonly known as:  DILAUDID  Take 1-2 tablets (2-4 mg total) by mouth every 4 (four) hours as needed for severe pain.     lactase 3000 UNITS tablet  Commonly known as:  LACTAID  Take 1 tablet by mouth daily.     loperamide 2 MG capsule  Commonly known as:  IMODIUM  Take 2 mg by mouth as needed for diarrhea or loose stools.     lubiprostone 24 MCG capsule  Commonly known as:  AMITIZA  Take 1 capsule (24 mcg total) by mouth daily with breakfast.     medroxyPROGESTERone 400 MG/ML Susp injection  Commonly known as:  DEPO-PROVERA  Inject 400 mg into the muscle once.     montelukast 10 MG tablet  Commonly known as:  SINGULAIR  Take 10 mg by mouth daily.     nadolol 40 MG tablet  Commonly known as:  CORGARD  TAKE 1 TABLET EVERY DAY     nitrofurantoin 100 MG capsule  Commonly known as:  MACRODANTIN  Take 100 mg by mouth at bedtime.     NUVIGIL 150 MG tablet  Generic drug:  Armodafinil  TAKE 1 TABLET BY MOUTH EVERY DAY     omeprazole 40 MG capsule  Commonly known as:  PRILOSEC  Take 40 mg by mouth daily.     OVER THE COUNTER MEDICATION  Take 1 tablet by mouth daily. NATURES PLUS ENERGY     polyethylene glycol packet  Commonly known as:  MIRALAX / GLYCOLAX  Take 17 g by mouth 2 (two) times daily.     potassium chloride 8 MEQ tablet  Commonly known as:  KLOR-CON  1 tablet daily     pseudoephedrine-acetaminophen 30-500 MG Tabs tablet  Commonly known as:  TYLENOL SINUS  Take 1 tablet by mouth every 4 (four) hours as needed (FOR COLD).     ranitidine 150 MG tablet  Commonly known as:  ZANTAC  Take 150 mg by mouth daily as needed for heartburn.     tiZANidine 4 MG tablet  Commonly known as:  ZANAFLEX  Take 1 tablet (4 mg total) by mouth every 8 (eight) hours as needed.     VITAMIN B 12  PO  Take 5,000 mg by mouth daily.      Vitamin D (Ergocalciferol) 50000 UNITS Caps capsule  Commonly known as:  DRISDOL  Take 50,000 Units by mouth every 7 (seven) days.     zolpidem 10 MG tablet  Commonly known as:  AMBIEN  Take 10 mg by mouth at bedtime as needed for sleep.        No orders of the defined types were placed in this encounter.    Immunization History  Administered Date(s) Administered  . Influenza Split 08/11/2011  . Influenza Whole 09/29/2004, 08/14/2009  . Pneumococcal Polysaccharide-23 10/16/2008    Social History  Substance Use Topics  . Smoking status: Former Smoker    Quit date: 09/29/1984  . Smokeless tobacco: Not on file  . Alcohol Use: Yes     Comment: wine occassionally    Family history is  + asthma and HD  Review of Systems  DATA OBTAINED: from patient, nurse GENERAL:  no fevers, fatigue, appetite changes SKIN: No itching, rash or wounds EYES: No eye pain, redness, discharge EARS: No earache, tinnitus, change in hearing NOSE: No congestion, drainage or bleeding  MOUTH/THROAT: No mouth or tooth pain, No sore throat RESPIRATORY: No cough, wheezing, SOB CARDIAC: No chest pain, palpitations, lower extremity edema  GI: No abdominal pain, No N/V/D or constipation, No heartburn or reflux  GU: No dysuria, frequency or urgency, or incontinence  MUSCULOSKELETAL: + unrelieved bone/joint pain,chronic NEUROLOGIC: No headache, dizziness or focal weakness PSYCHIATRIC: No c/o anxiety or sadness   Filed Vitals:   07/23/15 1223  BP: 139/74  Pulse: 77  Temp: 98.8 F (37.1 C)  Resp: 16    SpO2 Readings from Last 1 Encounters:  07/19/15 97%        Physical Exam  GENERAL APPEARANCE: Alert, conversant,  No acute distress.  SKIN: No diaphoresis rash HEAD: Normocephalic, atraumatic  EYES: Conjunctiva/lids clear. Pupils round, reactive. EOMs intact.  EARS: External exam WNL, canals clear. Hearing grossly normal.  NOSE: No deformity or discharge.  MOUTH/THROAT: Lips w/o lesions   RESPIRATORY: Breathing is even, unlabored. Lung sounds are clear   CARDIOVASCULAR: Heart RRR no murmurs, rubs or gallops. B non pitting peripheral edema.   GASTROINTESTINAL: Abdomen is soft, non-tender, not distended w/ normal bowel sounds. GENITOURINARY: Bladder non tender, not distended  MUSCULOSKELETAL: No abnormal joints or musculature NEUROLOGIC:  Cranial nerves 2-12 grossly intact. Moves all extremities  PSYCHIATRIC: Mood and affect appropriate to situation, no behavioral issues  Patient Active Problem List   Diagnosis Date Noted  . GERD (gastroesophageal reflux disease) 07/23/2015  . Chronic pain 07/23/2015  . Obese 07/19/2015  . S/P left THA, AA 07/17/2015  . 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 Insomnia 03/03/2011  . OSA (obstructive sleep apnea) 03/03/2011  . Depression, major, recurrent ([place]) 12/02/2010  . CONSTIPATION, SLOW TRANSIT 10/04/2010  . DIVERTICULITIS, COLON, WITH PERFORATION 08/27/2010  . ABDOMINAL PAIN, LEFT LOWER QUADRANT 08/16/2010  . BACTERIAL VAGINITIS 05/28/2010  . IRRITABLE BOWEL SYNDROME 08/14/2009  . CYSTITIS, CHRONIC INTERSTITIAL 06/27/2009  . UNSPECIFIED HYPOTHYROIDISM 05/16/2009  . LIPOMA OF OTHER SPECIFIED SITES 04/13/2009  . BRUXISM 04/13/2009  . SYNCOPE 03/13/2009  . MRI, BRAIN, ABNORMAL 02/07/2009  . MUSCLE WEAKNESS (GENERALIZED) 12/20/2008  . CERUMEN IMPACTION, BILATERAL 09/18/2008  . CANDIDIASIS OF UNSPECIFIED SITE 08/30/2008  . UNSPECIFIED ALLERGIC ALVEOLITIS AND PNEUMONITIS 08/02/2008  . HIP PAIN, LEFT, CHRONIC 07/04/2008  . MALAISE AND FATIGUE 07/04/2008  . LOW BACK PAIN 05/26/2008  . CHEST PAIN, ATYPICAL  03/02/2008  . EXTRINSIC ASTHMA, WITH EXACERBATION 02/03/2008  . DIABETES MELLITUS, TYPE II, UNCONTROLLED 01/06/2008  . PROTEINURIA 12/23/2007  . HYPERLIPIDEMIA 11/18/2007  . HYPERCALCEMIA 11/18/2007  . Essential hypertension 09/14/2007  . ANEMIA, B12 DEFICIENCY 05/18/2007  . DEGENERATIVE DISC DISEASE, CERVICAL SPINE  05/18/2007  . ALLERGIC RHINITIS 03/29/2007  . FIBROMYALGIA 03/29/2007    CBC    Component Value Date/Time   WBC 9.0 07/19/2015 0450   RBC 3.13* 07/19/2015 0450   HGB 9.9* 07/19/2015 0450   HCT 30.7* 07/19/2015 0450   PLT 310 07/19/2015 0450   MCV 98.1 07/19/2015 0450   LYMPHSABS 2.3 11/08/2010 2132   MONOABS 0.5 11/08/2010 2132   EOSABS 0.1 11/08/2010 2132   BASOSABS 0.0 11/08/2010 2132    CMP     Component Value Date/Time   NA 141 07/19/2015 0450   K 4.2 07/19/2015 0450   CL 104 07/19/2015 0450   CO2 31 07/19/2015 0450   GLUCOSE 111* 07/19/2015 0450   GLUCOSE 76 08/28/2006 1454   BUN 14 07/19/2015 0450   CREATININE 0.63 07/19/2015 0450   CALCIUM 9.3 07/19/2015 0450   CALCIUM 10.4 11/23/2008 0000   PROT 6.9 01/24/2011 1228   ALBUMIN 4.0 01/24/2011 1228   AST 19 01/24/2011 1228   ALT 14 01/24/2011 1228   ALKPHOS 76 01/24/2011 1228   BILITOT 0.6 01/24/2011 1228   GFRNONAA >60 07/19/2015 0450   GFRAA >60 07/19/2015 0450    Lab Results  Component Value Date   HGBA1C 5.6 11/12/2011     No results found.  Not all labs, radiology exams or other studies done during hospitalization come through on my EPIC note; however they are reviewed by me.    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; SNF - admitted for OT/PT; ASA 325 mg BID for 30 days, assumed from meds on med list, not stated in d/c sumary  Depression, major, recurrent SNF - cont cymbalta and doxepin  Essential hypertension SNF - dstable, cont nadalol and lasix  GERD (gastroesophageal reflux disease) SNF - cont 40 mg daily prilosec with zantac 150 mg prn  Chronic pain Reported conversation that ortho will tx hip pain but chronic back pain needs to be handled by pain clinic; SNF - agree   Time spent > 35 min;> 50% of time with patient was spent reviewing records, labs, tests and studies, counseling and developing plan of care  Hennie Duos, MD

## 2015-07-23 NOTE — Assessment & Plan Note (Signed)
SNF - cont cymbalta and doxepin

## 2015-07-23 NOTE — Assessment & Plan Note (Addendum)
2/2 endstage arthritis; SNF - admitted for OT/PT; ASA 325 mg BID for 30 days, as prophylaxis, assumed from meds on med list, not stated in d/c sumary

## 2015-07-30 ENCOUNTER — Encounter: Payer: Self-pay | Admitting: Internal Medicine

## 2015-08-02 ENCOUNTER — Encounter: Payer: Self-pay | Admitting: Internal Medicine

## 2015-08-02 ENCOUNTER — Non-Acute Institutional Stay (SKILLED_NURSING_FACILITY): Payer: Medicaid Other | Admitting: Internal Medicine

## 2015-08-02 DIAGNOSIS — F339 Major depressive disorder, recurrent, unspecified: Secondary | ICD-10-CM

## 2015-08-02 DIAGNOSIS — Z96649 Presence of unspecified artificial hip joint: Secondary | ICD-10-CM

## 2015-08-02 DIAGNOSIS — I10 Essential (primary) hypertension: Secondary | ICD-10-CM | POA: Diagnosis not present

## 2015-08-02 DIAGNOSIS — G8929 Other chronic pain: Secondary | ICD-10-CM

## 2015-08-02 DIAGNOSIS — K219 Gastro-esophageal reflux disease without esophagitis: Secondary | ICD-10-CM | POA: Diagnosis not present

## 2015-08-02 DIAGNOSIS — Z9660 Presence of unspecified orthopedic joint implant: Secondary | ICD-10-CM

## 2015-08-02 NOTE — Progress Notes (Signed)
MRN: [id number] Name: Kristin Pope  Sex: female Age: 59 y.o. DOB: 04-12-1956  [place] #: [name] Facility/Room: 129 Level Of Care: SNF Provider: Inocencio Homes D Emergency Contacts: Extended Emergency Contact Information Primary Emergency Contact: Leviner,Joyce Address: [address]          [place], [place] [address] [place] Phone: [phone] Relation: Mother  Code Status:   Allergies: Morphine and related; Codeine; Glutethimides; Lactose intolerance (gi); Lipitor; Tetracycline; Tramadol; and Hydrocodone-acetaminophen  Chief Complaint  Patient presents with  . Discharge Note    HPI: Patient is 59 y.o. female with depression, HTN,HLD, LBP and arthritis who was admitted to hospital from 10/18-20 for total L hip arthroplasty. Pt is admitted to SNF forOT/PT. Pt is now ready to be d/c to home.  Past Medical History  Diagnosis Date  . Allergy   . Depression   . Fibromyalgia   . Hypertension   . Hyperlipidemia   . Low back pain   . Family history of adverse reaction to anesthesia     sister has problems waking up  . Vaginitis     atropic-ongoing abnormal vaginal bleeding-had ultrasound and biopsy in last couple months  . Chronic fatigue   . Headache     constant headaches  . Arthritis     neck and spine, with bone spurs  . Torn rotator cuff     right shoulder    Past Surgical History  Procedure Laterality Date  . Hysteroscopy  01/2011  . Radial optic neurotomy      twice in lumbar area of back-every 6 months  . Colonoscopy w/ polypectomy    . Breast surgery      breast biopsy-benign  . Dilation and curettage of uterus    . Total hip arthroplasty Left 07/17/2015    Procedure: LEFT TOTAL HIP ARTHROPLASTY ANTERIOR APPROACH;  Surgeon: Paralee Cancel, MD;  Location: [facility];  Service: Orthopedics;  Laterality: Left;      Medication List       This list is accurate as of: 08/02/15  2:27 PM.  Always use your most recent med list.                acetaminophen 500 MG tablet  Commonly known as:  TYLENOL  Take 500 mg by mouth every 6 (six) hours as needed for mild pain.     ALPHA LIPOIC ACID PO  Take 1 tablet by mouth daily.     aspirin 325 MG EC tablet  Take 1 tablet (325 mg total) by mouth 2 (two) times daily.     azelastine 0.1 % nasal spray  Commonly known as:  ASTELIN  Place 1 spray into the nose 2 (two) times daily as needed. Use in each nostril as directed     budesonide-formoterol 160-4.5 MCG/ACT inhaler  Commonly known as:  SYMBICORT  Inhale 2 puffs into the lungs 2 (two) times daily as needed (FOR WHEEZING).     cholecalciferol 1000 UNITS tablet  Commonly known as:  VITAMIN D  Take 1,000 Units by mouth daily.     clonazePAM 1 MG tablet  Commonly known as:  KLONOPIN  Take 1 tablet (1 mg total) by mouth 4 (four) times daily.     conjugated estrogens vaginal cream  Commonly known as:  PREMARIN  Place 1 Applicatorful vaginally 2 (two) times a week.     docusate sodium 100 MG capsule  Commonly known as:  COLACE  Take 1 capsule (100 mg  total) by mouth 2 (two) times daily.     doxepin 50 MG capsule  Commonly known as:  SINEQUAN  Take 50 mg by mouth 2 (two) times daily as needed (for sleep).     DULoxetine 60 MG capsule  Commonly known as:  CYMBALTA  Take 1 capsule (60 mg total) by mouth daily.     estazolam 2 MG tablet  Commonly known as:  PROSOM  Take 1 tablet (2 mg total) by mouth at bedtime.     FA-Vitamin B-6-Vitamin B-12 2.2-25-0.5 MG Tabs  Take 1 tablet by mouth 2 (two) times daily.     ferrous sulfate 325 (65 FE) MG tablet  Take 1 tablet (325 mg total) by mouth 3 (three) times daily after meals.     fexofenadine 180 MG tablet  Commonly known as:  ALLEGRA  Take 180 mg by mouth daily as needed for allergies.     furosemide 20 MG tablet  Commonly known as:  LASIX  Take 20 mg by mouth every Monday, Wednesday, and Friday.     hydrocortisone cream 1 %  Apply 1 application topically as  directed.     HYDROmorphone 2 MG tablet  Commonly known as:  DILAUDID  Take 1-2 tablets (2-4 mg total) by mouth every 4 (four) hours as needed for severe pain.     lactase 3000 UNITS tablet  Commonly known as:  LACTAID  Take 1 tablet by mouth daily.     loperamide 2 MG capsule  Commonly known as:  IMODIUM  Take 2 mg by mouth as needed for diarrhea or loose stools.     lubiprostone 24 MCG capsule  Commonly known as:  AMITIZA  Take 1 capsule (24 mcg total) by mouth daily with breakfast.     medroxyPROGESTERone 400 MG/ML Susp injection  Commonly known as:  DEPO-PROVERA  Inject 400 mg into the muscle once.     montelukast 10 MG tablet  Commonly known as:  SINGULAIR  Take 10 mg by mouth daily.     nadolol 40 MG tablet  Commonly known as:  CORGARD  TAKE 1 TABLET EVERY DAY     nitrofurantoin 100 MG capsule  Commonly known as:  MACRODANTIN  Take 100 mg by mouth at bedtime.     NUVIGIL 150 MG tablet  Generic drug:  Armodafinil  TAKE 1 TABLET BY MOUTH EVERY DAY     omeprazole 40 MG capsule  Commonly known as:  PRILOSEC  Take 40 mg by mouth daily.     OVER THE COUNTER MEDICATION  Take 1 tablet by mouth daily. NATURES PLUS ENERGY     polyethylene glycol packet  Commonly known as:  MIRALAX / GLYCOLAX  Take 17 g by mouth 2 (two) times daily.     potassium chloride 8 MEQ tablet  Commonly known as:  KLOR-CON  1 tablet daily     pseudoephedrine-acetaminophen 30-500 MG Tabs tablet  Commonly known as:  TYLENOL SINUS  Take 1 tablet by mouth every 4 (four) hours as needed (FOR COLD).     ranitidine 150 MG tablet  Commonly known as:  ZANTAC  Take 150 mg by mouth daily as needed for heartburn.     tiZANidine 4 MG tablet  Commonly known as:  ZANAFLEX  Take 1 tablet (4 mg total) by mouth every 8 (eight) hours as needed.     VITAMIN B 12 PO  Take 5,000 mg by mouth daily.     Vitamin D (Ergocalciferol) 50000 UNITS  Caps capsule  Commonly known as:  DRISDOL  Take 50,000 Units  by mouth every 7 (seven) days.     zolpidem 10 MG tablet  Commonly known as:  AMBIEN  Take 10 mg by mouth at bedtime as needed for sleep.        No orders of the defined types were placed in this encounter.    Immunization History  Administered Date(s) Administered  . Influenza Split 08/11/2011  . Influenza Whole 09/29/2004, 08/14/2009  . PPD Test 07/19/2015  . Pneumococcal Polysaccharide-23 10/16/2008    Social History  Substance Use Topics  . Smoking status: Former Smoker    Quit date: 09/29/1984  . Smokeless tobacco: Not on file  . Alcohol Use: Yes     Comment: wine occassionally    Filed Vitals:   08/02/15 1421  BP: 118/66  Pulse: 78  Temp: 97.6 F (36.4 C)  Resp: 18    Physical Exam  GENERAL APPEARANCE: Alert, conversant. No acute distress.  HEENT: Unremarkable. RESPIRATORY: Breathing is even, unlabored. Lung sounds are clear   CARDIOVASCULAR: Heart RRR no murmurs, rubs or gallops. No peripheral edema.  GASTROINTESTINAL: Abdomen is soft, non-tender, not distended w/ normal bowel sounds.  NEUROLOGIC: Cranial nerves 2-12 grossly intact. Moves all extremities  Patient Active Problem List   Diagnosis Date Noted  . GERD (gastroesophageal reflux disease) 07/23/2015  . Chronic pain 07/23/2015  . Obese 07/19/2015  . S/P left THA, AA 07/17/2015  . 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 Insomnia 03/03/2011  . OSA (obstructive sleep apnea) 03/03/2011  . Depression, major, recurrent ([place]) 12/02/2010  . CONSTIPATION, SLOW TRANSIT 10/04/2010  . DIVERTICULITIS, COLON, WITH PERFORATION 08/27/2010  . ABDOMINAL PAIN, LEFT LOWER QUADRANT 08/16/2010  . BACTERIAL VAGINITIS 05/28/2010  . IRRITABLE BOWEL SYNDROME 08/14/2009  . CYSTITIS, CHRONIC INTERSTITIAL 06/27/2009  . UNSPECIFIED HYPOTHYROIDISM 05/16/2009  . LIPOMA OF OTHER SPECIFIED SITES 04/13/2009  . BRUXISM 04/13/2009  . SYNCOPE 03/13/2009  . MRI, BRAIN, ABNORMAL 02/07/2009  . MUSCLE WEAKNESS (GENERALIZED)  12/20/2008  . CERUMEN IMPACTION, BILATERAL 09/18/2008  . CANDIDIASIS OF UNSPECIFIED SITE 08/30/2008  . UNSPECIFIED ALLERGIC ALVEOLITIS AND PNEUMONITIS 08/02/2008  . HIP PAIN, LEFT, CHRONIC 07/04/2008  . MALAISE AND FATIGUE 07/04/2008  . LOW BACK PAIN 05/26/2008  . CHEST PAIN, ATYPICAL 03/02/2008  . EXTRINSIC ASTHMA, WITH EXACERBATION 02/03/2008  . DIABETES MELLITUS, TYPE II, UNCONTROLLED 01/06/2008  . PROTEINURIA 12/23/2007  . HYPERLIPIDEMIA 11/18/2007  . HYPERCALCEMIA 11/18/2007  . Essential hypertension 09/14/2007  . ANEMIA, B12 DEFICIENCY 05/18/2007  . 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 ALLERGIC RHINITIS 03/29/2007  . FIBROMYALGIA 03/29/2007    CBC    Component Value Date/Time   WBC 9.0 07/19/2015 0450   RBC 3.13* 07/19/2015 0450   HGB 9.9* 07/19/2015 0450   HCT 30.7* 07/19/2015 0450   PLT 310 07/19/2015 0450   MCV 98.1 07/19/2015 0450   LYMPHSABS 2.3 11/08/2010 2132   MONOABS 0.5 11/08/2010 2132   EOSABS 0.1 11/08/2010 2132   BASOSABS 0.0 11/08/2010 2132    CMP     Component Value Date/Time   NA 141 07/19/2015 0450   K 4.2 07/19/2015 0450   CL 104 07/19/2015 0450   CO2 31 07/19/2015 0450   GLUCOSE 111* 07/19/2015 0450   GLUCOSE 76 08/28/2006 1454   BUN 14 07/19/2015 0450   CREATININE 0.63 07/19/2015 0450   CALCIUM 9.3 07/19/2015 0450   CALCIUM 10.4 11/23/2008 0000   PROT 6.9 01/24/2011 1228   ALBUMIN 4.0 01/24/2011  1228   AST 19 01/24/2011 1228   ALT 14 01/24/2011 1228   ALKPHOS 76 01/24/2011 1228   BILITOT 0.6 01/24/2011 1228   GFRNONAA >60 07/19/2015 0450   GFRAA >60 07/19/2015 0450    Assessment and Plan  Pt is being d/c to home. Rx's have been written. For control substances , diluadid 2 mg q 4 prn was written for 1 week, klonopin 1 mg QID for 2 weeks and ambien 10 mg qHS prn  2 week supply.   Time spent 45 min Hennie Duos, MD

## 2015-08-10 NOTE — Patient Instructions (Addendum)
YOUR PROCEDURE IS SCHEDULED ON :  08/21/15  REPORT TO [facility] MAIN ENTRANCE FOLLOW SIGNS TO EAST ELEVATOR - GO TO 3rd FLOOR CHECK IN AT 3 EAST NURSES STATION (SHORT STAY) AT:  10:00 AM  CALL THIS NUMBER IF YOU HAVE PROBLEMS THE MORNING OF SURGERY [phone]  REMEMBER:ONLY 1 PER PERSON MAY GO TO SHORT STAY WITH YOU TO GET READY THE MORNING OF YOUR SURGERY  DO NOT EAT FOOD  AFTER MIDNIGHT  MAY HAVE CLEAR LIQUIDS UNTIL 7:00 AM  TAKE THESE MEDICINES THE MORNING OF SURGERY:  MAY TAKE CLONAZEPAM / SYMBICORT / DILAUDID IF NEEDED  CLEAR LIQUID DIET  Foods Allowed                                                                     Foods Excluded  Coffee and tea, regular and decaf                             liquids that you cannot  Plain Jell-O in any flavor                                             see through such as: Fruit ices (not with fruit pulp)                                     milk, soups, orange juice  Iced Popsicles                                                All solid food Carbonated beverages, regular and diet                                    Cranberry, grape and apple juices Sports drinks like Gatorade Lightly seasoned clear broth or consume(fat free) Sugar, honey syrup   _____________________________________________________________________    YOU MAY NOT HAVE ANY METAL ON YOUR BODY INCLUDING HAIR PINS AND PIERCING'S. DO NOT WEAR JEWELRY, MAKEUP, LOTIONS, POWDERS OR PERFUMES. DO NOT WEAR NAIL POLISH. DO NOT SHAVE 48 HRS PRIOR TO SURGERY. MEN MAY SHAVE FACE AND NECK.  DO NOT [place]. [facility] IS NOT RESPONSIBLE FOR VALUABLES.  CONTACTS, DENTURES OR PARTIALS MAY NOT BE WORN TO SURGERY. LEAVE SUITCASE IN CAR. CAN BE BROUGHT TO ROOM AFTER SURGERY.  PATIENTS DISCHARGED THE DAY OF SURGERY WILL NOT BE ALLOWED TO DRIVE HOME.  PLEASE READ OVER THE FOLLOWING INSTRUCTION  SHEETS _________________________________________________________________________________                                          [facility] - PREPARING FOR SURGERY  Before surgery, you can play an important  role.  Because skin is not sterile, your skin needs to be as free of germs as possible.  You can reduce the number of germs on your skin by washing with CHG (chlorahexidine gluconate) soap before surgery.  CHG is an antiseptic cleaner which kills germs and bonds with the skin to continue killing germs even after washing. Please DO NOT use if you have an allergy to CHG or antibacterial soaps.  If your skin becomes reddened/irritated stop using the CHG and inform your nurse when you arrive at Short Stay. Do not shave (including legs and underarms) for at least 48 hours prior to the first CHG shower.  You may shave your face. Please follow these instructions carefully:   1.  Shower with CHG Soap the night before surgery and the  morning of Surgery.   2.  If you choose to wash your hair, wash your hair first as usual with your  normal  Shampoo.   3.  After you shampoo, rinse your hair and body thoroughly to remove the  shampoo.                                         4.  Use CHG as you would any other liquid soap.  You can apply chg directly  to the skin and wash . Gently wash with scrungie or clean wascloth    5.  Apply the CHG Soap to your body ONLY FROM THE NECK DOWN.   Do not use on open                           Wound or open sores. Avoid contact with eyes, ears mouth and genitals (private parts).                        Genitals (private parts) with your normal soap.              6.  Wash thoroughly, paying special attention to the area where your surgery  will be performed.   7.  Thoroughly rinse your body with warm water from the neck down.   8.  DO NOT shower/wash with your normal soap after using and rinsing off  the CHG Soap .                9.  Pat yourself dry with a clean  towel.             10.  Wear clean night clothes to bed after shower             11.  Place clean sheets on your bed the night of your first shower and do not  sleep with pets.  Day of Surgery : Do not apply any lotions/deodorants the morning of surgery.  Please wear clean clothes to the hospital/surgery center.  FAILURE TO FOLLOW THESE INSTRUCTIONS MAY RESULT IN THE CANCELLATION OF YOUR SURGERY    PATIENT SIGNATURE_________________________________  ______________________________________________________________________     Kristin Pope  An incentive spirometer is a tool that can help keep your lungs clear and active. This tool measures how well you are filling your lungs with each breath. Taking long deep breaths may help reverse or decrease the chance of developing breathing (pulmonary) problems (especially infection) following:  A long period of time when you are unable  to move or be active. BEFORE THE PROCEDURE   If the spirometer includes an indicator to show your best effort, your nurse or respiratory therapist will set it to a desired goal.  If possible, sit up straight or lean slightly forward. Try not to slouch.  Hold the incentive spirometer in an upright position. INSTRUCTIONS FOR USE   Sit on the edge of your bed if possible, or sit up as far as you can in bed or on a chair.  Hold the incentive spirometer in an upright position.  Breathe out normally.  Place the mouthpiece in your mouth and seal your lips tightly around it.  Breathe in slowly and as deeply as possible, raising the piston or the ball toward the top of the column.  Hold your breath for 3-5 seconds or for as long as possible. Allow the piston or ball to fall to the bottom of the column.  Remove the mouthpiece from your mouth and breathe out normally.  Rest for a few seconds and repeat Steps 1 through 7 at least 10 times every 1-2 hours when you are awake. Take your time and take a few  normal breaths between deep breaths.  The spirometer may include an indicator to show your best effort. Use the indicator as a goal to work toward during each repetition.  After each set of 10 deep breaths, practice coughing to be sure your lungs are clear. If you have an incision (the cut made at the time of surgery), support your incision when coughing by placing a pillow or rolled up towels firmly against it. Once you are able to get out of bed, walk around indoors and cough well. You may stop using the incentive spirometer when instructed by your caregiver.  RISKS AND COMPLICATIONS  Take your time so you do not get dizzy or light-headed.  If you are in pain, you may need to take or ask for pain medication before doing incentive spirometry. It is harder to take a deep breath if you are having pain. AFTER USE  Rest and breathe slowly and easily.  It can be helpful to keep track of a log of your progress. Your caregiver can provide you with a simple table to help with this. If you are using the spirometer at home, follow these instructions: [place] IF:   You are having difficultly using the spirometer.  You have trouble using the spirometer as often as instructed.  Your pain medication is not giving enough relief while using the spirometer.  You develop fever of 100.5 F (38.1 C) or higher. SEEK IMMEDIATE MEDICAL CARE IF:   You cough up bloody sputum that had not been present before.  You develop fever of 102 F (38.9 C) or greater.  You develop worsening pain at or near the incision site. MAKE SURE YOU:   Understand these instructions.  Will watch your condition.  Will get help right away if you are not doing well or get worse. Document Released: 01/26/2007 Document Revised: 12/08/2011 Document Reviewed: 03/29/2007 Exit[employer] Patient Information 2014 [employer], [place].   ________________________________________________________________________  WHAT IS A BLOOD  TRANSFUSION? Blood Transfusion Information  A transfusion is the replacement of blood or some of its parts. Blood is made up of multiple cells which provide different functions.  Red blood cells carry oxygen and are used for blood loss replacement.  White blood cells fight against infection.  Platelets control bleeding.  Plasma helps clot blood.  Other blood products are  available for specialized needs, such as hemophilia or other clotting disorders. BEFORE THE TRANSFUSION  Who gives blood for transfusions?   Healthy volunteers who are fully evaluated to make sure their blood is safe. This is blood bank blood. Transfusion therapy is the safest it has ever been in the practice of medicine. Before blood is taken from a donor, a complete history is taken to make sure that person has no history of diseases nor engages in risky social behavior (examples are intravenous drug use or sexual activity with multiple partners). The donor's travel history is screened to minimize risk of transmitting infections, such as malaria. The donated blood is tested for signs of infectious diseases, such as HIV and hepatitis. The blood is then tested to be sure it is compatible with you in order to minimize the chance of a transfusion reaction. If you or a relative donates blood, this is often done in anticipation of surgery and is not appropriate for emergency situations. It takes many days to process the donated blood. RISKS AND COMPLICATIONS Although transfusion therapy is very safe and saves many lives, the main dangers of transfusion include:   Getting an infectious disease.  Developing a transfusion reaction. This is an allergic reaction to something in the blood you were given. Every precaution is taken to prevent this. The decision to have a blood transfusion has been considered carefully by your caregiver before blood is given. Blood is not given unless the benefits outweigh the risks. AFTER THE  TRANSFUSION  Right after receiving a blood transfusion, you will usually feel much better and more energetic. This is especially true if your red blood cells have gotten low (anemic). The transfusion raises the level of the red blood cells which carry oxygen, and this usually causes an energy increase.  The nurse administering the transfusion will monitor you carefully for complications. HOME CARE INSTRUCTIONS  No special instructions are needed after a transfusion. You may find your energy is better. Speak with your caregiver about any limitations on activity for underlying diseases you may have. SEEK MEDICAL CARE IF:   Your condition is not improving after your transfusion.  You develop redness or irritation at the intravenous (IV) site. SEEK IMMEDIATE MEDICAL CARE IF:  Any of the following symptoms occur over the next 12 hours:  Shaking chills.  You have a temperature by mouth above 102 F (38.9 C), not controlled by medicine.  Chest, back, or muscle pain.  People around you feel you are not acting correctly or are confused.  Shortness of breath or difficulty breathing.  Dizziness and fainting.  You get a rash or develop hives.  You have a decrease in urine output.  Your urine turns a dark color or changes to pink, red, or brown. Any of the following symptoms occur over the next 10 days:  You have a temperature by mouth above 102 F (38.9 C), not controlled by medicine.  Shortness of breath.  Weakness after normal activity.  The white part of the eye turns yellow (jaundice).  You have a decrease in the amount of urine or are urinating less often.  Your urine turns a dark color or changes to pink, red, or brown. Document Released: 09/12/2000 Document Revised: 12/08/2011 Document Reviewed: 05/01/2008 [facility] Patient Information 2014 [place], [place].  _______________________________________________________________________

## 2015-08-13 ENCOUNTER — Encounter (HOSPITAL_COMMUNITY): Payer: Self-pay

## 2015-08-13 ENCOUNTER — Encounter (HOSPITAL_COMMUNITY)
Admission: RE | Admit: 2015-08-13 | Discharge: 2015-08-13 | Disposition: A | Discharge status: Home / Self Care | Payer: Medicaid Other | Source: Ambulatory Visit | Attending: Orthopedic Surgery | Admitting: Orthopedic Surgery

## 2015-08-13 ENCOUNTER — Encounter (INDEPENDENT_AMBULATORY_CARE_PROVIDER_SITE_OTHER): Payer: Self-pay

## 2015-08-13 DIAGNOSIS — Z01812 Encounter for preprocedural laboratory examination: Secondary | ICD-10-CM | POA: Insufficient documentation

## 2015-08-13 HISTORY — DX: Vitamin B12 deficiency anemia due to intrinsic factor deficiency: D51.0

## 2015-08-13 HISTORY — DX: Anxiety disorder, unspecified: F41.9

## 2015-08-13 HISTORY — DX: Other cervical disc degeneration, unspecified cervical region: M50.30

## 2015-08-13 HISTORY — DX: Personal history of other diseases of urinary system: Z87.448

## 2015-08-13 LAB — URINALYSIS, ROUTINE W REFLEX MICROSCOPIC
Bilirubin Urine: NEGATIVE
GLUCOSE, UA: NEGATIVE mg/dL
HGB URINE DIPSTICK: NEGATIVE
Ketones, ur: NEGATIVE mg/dL
Nitrite: NEGATIVE
PH: 5.5 (ref 5.0–8.0)
Protein, ur: 30 mg/dL — AB
SPECIFIC GRAVITY, URINE: 1.027 (ref 1.005–1.030)
UROBILINOGEN UA: 0.2 mg/dL (ref 0.0–1.0)

## 2015-08-13 LAB — BASIC METABOLIC PANEL
ANION GAP: 10 (ref 5–15)
BUN: 15 mg/dL (ref 6–20)
CALCIUM: 9.9 mg/dL (ref 8.9–10.3)
CO2: 26 mmol/L (ref 22–32)
CREATININE: 0.59 mg/dL (ref 0.44–1.00)
Chloride: 99 mmol/L — ABNORMAL LOW (ref 101–111)
GFR calc Af Amer: >60 mL/min (ref >60)
GFR calc non Af Amer: >60 mL/min (ref >60)
Glucose, Bld: 90 mg/dL (ref 65–99)
Potassium: 4.8 mmol/L (ref 3.5–5.1)
Sodium: 135 mmol/L (ref 135–145)

## 2015-08-13 LAB — CBC
HCT: 35.5 % — ABNORMAL LOW (ref 36.0–46.0)
HEMOGLOBIN: 11.7 g/dL — AB (ref 12.0–15.0)
MCH: 31.2 pg (ref 26.0–34.0)
MCHC: 33 g/dL (ref 30.0–36.0)
MCV: 94.7 fL (ref 78.0–100.0)
PLATELETS: 446 10*3/uL — AB (ref 150–400)
RBC: 3.75 MIL/uL — AB (ref 3.87–5.11)
RDW: 13.7 % (ref 11.5–15.5)
WBC: 7.7 10*3/uL (ref 4.0–10.5)

## 2015-08-13 LAB — PROTIME-INR
INR: 1.04 (ref 0.00–1.49)
PROTHROMBIN TIME: 13.8 s (ref 11.6–15.2)

## 2015-08-13 LAB — APTT: APTT: 29 s (ref 24–37)

## 2015-08-13 LAB — URINE MICROSCOPIC-ADD ON

## 2015-08-13 LAB — SURGICAL PCR SCREEN
MRSA, PCR: NEGATIVE
Staphylococcus aureus: NEGATIVE

## 2015-08-13 NOTE — H&P (Signed)
TOTAL HIP ADMISSION H&P  Patient is admitted for right total hip arthroplasty, anterior approach.  Subjective:  Chief Complaint:     Right hip primary OA / pain  HPI: Kristin Pope, 59 y.o. female, has a history of pain and functional disability in the right hip(s) due to arthritis and patient has failed non-surgical conservative treatments for greater than 12 weeks to include NSAID's and/or analgesics, corticosteriod injections and activity modification.  Onset of symptoms was gradual starting years ago with gradually worsening course since that time.The patient noted prior procedures of the hip to include arthroplasty on the left hip (07/17/2015 per Dr. Alvan Dame).  Patient currently rates pain in the right hip at 10 out of 10 with activity. Patient has worsening of pain with activity and weight bearing, trendelenberg gait, pain that interfers with activities of daily living and pain with passive range of motion. Patient has evidence of periarticular osteophytes and joint space narrowing by imaging studies. This condition presents safety issues increasing the risk of falls.   There is no current active infection.  Risks, benefits and expectations were discussed with the patient.  Risks including but not limited to the risk of anesthesia, blood clots, nerve damage, blood vessel damage, failure of the prosthesis, infection and up to and including death.  Patient understand the risks, benefits and expectations and wishes to proceed with surgery.   PCP: Marijean Bravo, MD  D/C Plans:      Home with HHPT/SNF  Post-op Meds:       No Rx given   Tranexamic Acid:      To be given - IV   Decadron:      Is to be given  FYI:     ASA post-op  Norco post-op  Dilaudid (ok per the patient)    Patient Active Problem List   Diagnosis Date Noted  . GERD (gastroesophageal reflux disease) 07/23/2015  . Chronic pain 07/23/2015  . Obese 07/19/2015  . S/P left THA, AA 07/17/2015  . 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 Insomnia 03/03/2011  . OSA (obstructive sleep apnea) 03/03/2011  . Depression, major, recurrent ([place]) 12/02/2010  . CONSTIPATION, SLOW TRANSIT 10/04/2010  . DIVERTICULITIS, COLON, WITH PERFORATION 08/27/2010  . ABDOMINAL PAIN, LEFT LOWER QUADRANT 08/16/2010  . BACTERIAL VAGINITIS 05/28/2010  . IRRITABLE BOWEL SYNDROME 08/14/2009  . CYSTITIS, CHRONIC INTERSTITIAL 06/27/2009  . UNSPECIFIED HYPOTHYROIDISM 05/16/2009  . LIPOMA OF OTHER SPECIFIED SITES 04/13/2009  . BRUXISM 04/13/2009  . SYNCOPE 03/13/2009  . MRI, BRAIN, ABNORMAL 02/07/2009  . MUSCLE WEAKNESS (GENERALIZED) 12/20/2008  . CERUMEN IMPACTION, BILATERAL 09/18/2008  . CANDIDIASIS OF UNSPECIFIED SITE 08/30/2008  . UNSPECIFIED ALLERGIC ALVEOLITIS AND PNEUMONITIS 08/02/2008  . HIP PAIN, LEFT, CHRONIC 07/04/2008  . MALAISE AND FATIGUE 07/04/2008  . LOW BACK PAIN 05/26/2008  . CHEST PAIN, ATYPICAL 03/02/2008  . EXTRINSIC ASTHMA, WITH EXACERBATION 02/03/2008  . DIABETES MELLITUS, TYPE II, UNCONTROLLED 01/06/2008  . PROTEINURIA 12/23/2007  . HYPERLIPIDEMIA 11/18/2007  . HYPERCALCEMIA 11/18/2007  . Essential hypertension 09/14/2007  . ANEMIA, B12 DEFICIENCY 05/18/2007  . 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 ALLERGIC RHINITIS 03/29/2007  . FIBROMYALGIA 03/29/2007   Past Medical History  Diagnosis Date  . Allergy   . Depression   . Fibromyalgia   . Hypertension   . Hyperlipidemia   . Low back pain   . Family history of adverse reaction to anesthesia     sister has problems waking up  . Vaginitis  atropic-ongoing abnormal vaginal bleeding-had ultrasound and biopsy in last couple months  . Chronic fatigue   . Headache     constant headaches  . Arthritis     neck and spine, with bone spurs  . Torn rotator cuff     right shoulder    Past Surgical History  Procedure Laterality Date  . Hysteroscopy  01/2011  . Radial optic neurotomy      twice in lumbar area of back-every 6  months  . Colonoscopy w/ polypectomy    . Breast surgery      breast biopsy-benign  . Dilation and curettage of uterus    . Total hip arthroplasty Left 07/17/2015    Procedure: LEFT TOTAL HIP ARTHROPLASTY ANTERIOR APPROACH;  Surgeon: Paralee Cancel, MD;  Location: [facility];  Service: Orthopedics;  Laterality: Left;    No prescriptions prior to admission   Allergies  Allergen Reactions  . Morphine And Related Other (See Comments)    Headaches  . Codeine     REACTION: Insomnia  . Glutethimides     UNKNOWN  . Lactose Intolerance (Gi)     UNKNOWN  . Lipitor [Atorvastatin]     PAIN  . Tetracycline     REACTION: Nausea Vomiting  . Tramadol     UNKNOWN  . Hydrocodone-Acetaminophen     REACTION: Nausea Vomiting Fluid retention                               CAN TAKE !/2 TAB W//O PROBLEMS    Social History  Substance Use Topics  . Smoking status: Former Smoker    Quit date: 09/29/1984  . Smokeless tobacco: Not on file  . Alcohol Use: Yes     Comment: wine occassionally    Family History  Problem Relation Age of Onset  . Coronary artery disease Brother   . Heart disease Mother   . Heart attack Mother   . Breast cancer Sister   . Lung cancer Sister   . Breast cancer Maternal Aunt   . Breast cancer Maternal Grandmother   . Stroke Maternal Grandfather   . Brain cancer Paternal Grandfather   . Asthma Mother   . Lung cancer Paternal Grandfather   . Rheum arthritis Maternal Grandmother      Review of Systems  Constitutional: Positive for malaise/fatigue.  Eyes: Negative.   Respiratory: Negative.   Cardiovascular: Negative.   Gastrointestinal: Positive for heartburn.  Genitourinary: Negative.   Musculoskeletal: Positive for back pain and joint pain.  Skin: Negative.   Neurological: Positive for headaches.  Endo/Heme/Allergies: Positive for environmental allergies.  Psychiatric/Behavioral: Positive for depression. The patient has insomnia.     Objective:  Physical  Exam  Constitutional: She is oriented to person, place, and time. She appears well-developed and well-nourished.  HENT:  Head: Normocephalic and atraumatic.  Eyes: Pupils are equal, round, and reactive to light.  Neck: Neck supple. No JVD present. No tracheal deviation present. No thyromegaly present.  Cardiovascular: Normal rate, regular rhythm, normal heart sounds and intact distal pulses.   Respiratory: Effort normal and breath sounds normal. No stridor. No respiratory distress. She has no wheezes.  GI: Soft. There is no tenderness. There is no guarding.  Musculoskeletal:       Right hip: She exhibits decreased range of motion, decreased strength, tenderness and bony tenderness. She exhibits no swelling, no deformity and no laceration.  Lymphadenopathy:    She has no  cervical adenopathy.  Neurological: She is alert and oriented to person, place, and time. A sensory deficit (bilateral LEs and right UE) is present.  Skin: Skin is warm and dry.  Psychiatric: She has a normal mood and affect.      Labs:  Estimated body mass index is 34.49 kg/(m^2) as calculated from the following:   Height as of 07/17/15: 4\' 10"  (1.473 m).   Weight as of 07/04/15: 74.844 kg (165 lb).   Imaging Review Plain radiographs demonstrate severe degenerative joint disease of the right hip(s). The bone quality appears to be good for age and reported activity level.  Assessment/Plan:  End stage arthritis, right hip(s)  The patient history, physical examination, clinical judgement of the provider and imaging studies are consistent with end stage degenerative joint disease of the right hip(s) and total hip arthroplasty is deemed medically necessary. The treatment options including medical management, injection therapy, arthroscopy and arthroplasty were discussed at length. The risks and benefits of total hip arthroplasty were presented and reviewed. The risks due to aseptic loosening, infection, stiffness,  dislocation/subluxation,  thromboembolic complications and other imponderables were discussed.  The patient acknowledged the explanation, agreed to proceed with the plan and consent was signed. Patient is being admitted for inpatient treatment for surgery, pain control, PT, OT, prophylactic antibiotics, VTE prophylaxis, progressive ambulation and ADL's and discharge planning.The patient is planning to be discharged to skilled nursing facility/home.      West Pugh Rachit Grim   PA-C  08/13/2015, 12:23 PM

## 2015-08-14 NOTE — Progress Notes (Signed)
Abnormal UA faxe to Dr.Olin

## 2015-08-18 ENCOUNTER — Other Ambulatory Visit: Payer: Self-pay | Admitting: Internal Medicine

## 2015-08-21 ENCOUNTER — Encounter (HOSPITAL_COMMUNITY): Payer: Self-pay | Admitting: *Deleted

## 2015-08-21 ENCOUNTER — Inpatient Hospital Stay (HOSPITAL_COMMUNITY): Payer: Medicaid Other

## 2015-08-21 ENCOUNTER — Inpatient Hospital Stay (HOSPITAL_COMMUNITY): Payer: Medicaid Other | Admitting: Anesthesiology

## 2015-08-21 ENCOUNTER — Inpatient Hospital Stay (HOSPITAL_COMMUNITY)
Admission: RE | Admit: 2015-08-21 | Discharge: 2015-08-27 | DRG: 470 | Disposition: A | Discharge status: Home Health | Payer: Medicaid Other | Source: Ambulatory Visit | Attending: Orthopedic Surgery | Admitting: Orthopedic Surgery

## 2015-08-21 ENCOUNTER — Encounter (HOSPITAL_COMMUNITY)
Admission: RE | Disposition: A | Discharge status: Home Health | Payer: Self-pay | Source: Ambulatory Visit | Attending: Orthopedic Surgery

## 2015-08-21 DIAGNOSIS — F339 Major depressive disorder, recurrent, unspecified: Secondary | ICD-10-CM | POA: Diagnosis present

## 2015-08-21 DIAGNOSIS — E039 Hypothyroidism, unspecified: Secondary | ICD-10-CM | POA: Diagnosis present

## 2015-08-21 DIAGNOSIS — R7303 Prediabetes: Secondary | ICD-10-CM | POA: Diagnosis present

## 2015-08-21 DIAGNOSIS — Z6834 Body mass index (BMI) 34.0-34.9, adult: Secondary | ICD-10-CM

## 2015-08-21 DIAGNOSIS — E875 Hyperkalemia: Secondary | ICD-10-CM | POA: Diagnosis not present

## 2015-08-21 DIAGNOSIS — M797 Fibromyalgia: Secondary | ICD-10-CM | POA: Diagnosis present

## 2015-08-21 DIAGNOSIS — F419 Anxiety disorder, unspecified: Secondary | ICD-10-CM | POA: Diagnosis present

## 2015-08-21 DIAGNOSIS — Z87891 Personal history of nicotine dependence: Secondary | ICD-10-CM | POA: Diagnosis not present

## 2015-08-21 DIAGNOSIS — K219 Gastro-esophageal reflux disease without esophagitis: Secondary | ICD-10-CM | POA: Diagnosis present

## 2015-08-21 DIAGNOSIS — E669 Obesity, unspecified: Secondary | ICD-10-CM | POA: Diagnosis present

## 2015-08-21 DIAGNOSIS — Z96642 Presence of left artificial hip joint: Secondary | ICD-10-CM | POA: Diagnosis present

## 2015-08-21 DIAGNOSIS — Z01812 Encounter for preprocedural laboratory examination: Secondary | ICD-10-CM | POA: Diagnosis not present

## 2015-08-21 DIAGNOSIS — I10 Essential (primary) hypertension: Secondary | ICD-10-CM | POA: Diagnosis present

## 2015-08-21 DIAGNOSIS — M1611 Unilateral primary osteoarthritis, right hip: Secondary | ICD-10-CM | POA: Diagnosis present

## 2015-08-21 DIAGNOSIS — Z79899 Other long term (current) drug therapy: Secondary | ICD-10-CM

## 2015-08-21 DIAGNOSIS — M25551 Pain in right hip: Secondary | ICD-10-CM | POA: Diagnosis present

## 2015-08-21 DIAGNOSIS — Z96649 Presence of unspecified artificial hip joint: Secondary | ICD-10-CM

## 2015-08-21 HISTORY — PX: TOTAL HIP ARTHROPLASTY: SHX124

## 2015-08-21 LAB — GLUCOSE, CAPILLARY
Glucose-Capillary: 89 mg/dL (ref 65–99)
Glucose-Capillary: 97 mg/dL (ref 65–99)

## 2015-08-21 LAB — TYPE AND SCREEN
ABO/RH(D): A NEG
ANTIBODY SCREEN: NEGATIVE

## 2015-08-21 SURGERY — ARTHROPLASTY, HIP, TOTAL, ANTERIOR APPROACH
Anesthesia: Spinal | Site: Knee | Laterality: Right

## 2015-08-21 MED ORDER — PROPOFOL 500 MG/50ML IV EMUL
INTRAVENOUS | Status: DC | PRN
  Administered 2015-08-21: 75 ug/kg/min via INTRAVENOUS

## 2015-08-21 MED ORDER — CLONAZEPAM 1 MG PO TABS
1.0000 mg | ORAL_TABLET | Freq: Four times a day (QID) | ORAL | Status: DC
  Administered 2015-08-21 – 2015-08-27 (×20): 1 mg via ORAL
  Filled 2015-08-21 (×22): qty 1

## 2015-08-21 MED ORDER — BUDESONIDE-FORMOTEROL FUMARATE 160-4.5 MCG/ACT IN AERO
2.0000 | INHALATION_SPRAY | Freq: Two times a day (BID) | RESPIRATORY_TRACT | Status: DC | PRN
  Filled 2015-08-21: qty 6

## 2015-08-21 MED ORDER — HYDROMORPHONE HCL 2 MG PO TABS
2.0000 mg | ORAL_TABLET | ORAL | Status: DC
  Administered 2015-08-21 – 2015-08-23 (×9): 4 mg via ORAL
  Administered 2015-08-23: 2 mg via ORAL
  Administered 2015-08-23 (×2): 4 mg via ORAL
  Administered 2015-08-24 – 2015-08-27 (×19): 2 mg via ORAL
  Filled 2015-08-21 (×4): qty 1
  Filled 2015-08-21 (×4): qty 2
  Filled 2015-08-21 (×2): qty 1
  Filled 2015-08-21: qty 2
  Filled 2015-08-21: qty 1
  Filled 2015-08-21 (×2): qty 2
  Filled 2015-08-21 (×2): qty 1
  Filled 2015-08-21 (×4): qty 2
  Filled 2015-08-21 (×3): qty 1
  Filled 2015-08-21: qty 2
  Filled 2015-08-21 (×8): qty 1

## 2015-08-21 MED ORDER — ONDANSETRON HCL 4 MG/2ML IJ SOLN: 4.0000 mg | Freq: Four times a day (QID) | INTRAMUSCULAR | Status: DC | PRN

## 2015-08-21 MED ORDER — HYDROMORPHONE HCL 1 MG/ML IJ SOLN
0.5000 mg | INTRAMUSCULAR | Status: DC | PRN
  Administered 2015-08-21 – 2015-08-22 (×2): 1 mg via INTRAVENOUS
  Filled 2015-08-21 (×2): qty 1

## 2015-08-21 MED ORDER — ONDANSETRON HCL 4 MG/2ML IJ SOLN
INTRAMUSCULAR | Status: DC | PRN
  Administered 2015-08-21: 4 mg via INTRAVENOUS

## 2015-08-21 MED ORDER — SODIUM CHLORIDE 0.9 % IR SOLN
Status: DC | PRN
  Administered 2015-08-21: 1000 mL

## 2015-08-21 MED ORDER — MAGNESIUM CITRATE PO SOLN: 1.0000 | Freq: Once | ORAL | Status: DC | PRN

## 2015-08-21 MED ORDER — AZELASTINE HCL 0.1 % NA SOLN
1.0000 | Freq: Two times a day (BID) | NASAL | Status: DC | PRN
  Filled 2015-08-21: qty 30

## 2015-08-21 MED ORDER — FENTANYL CITRATE (PF) 100 MCG/2ML IJ SOLN
INTRAMUSCULAR | Status: AC
  Filled 2015-08-21: qty 2

## 2015-08-21 MED ORDER — METHOCARBAMOL 500 MG PO TABS
500.0000 mg | ORAL_TABLET | Freq: Four times a day (QID) | ORAL | Status: DC | PRN
  Administered 2015-08-22 – 2015-08-25 (×5): 500 mg via ORAL
  Filled 2015-08-21 (×5): qty 1

## 2015-08-21 MED ORDER — MIDAZOLAM HCL 2 MG/2ML IJ SOLN
INTRAMUSCULAR | Status: AC
  Filled 2015-08-21: qty 2

## 2015-08-21 MED ORDER — NITROFURANTOIN MACROCRYSTAL 100 MG PO CAPS
100.0000 mg | ORAL_CAPSULE | Freq: Every day | ORAL | Status: DC
  Administered 2015-08-21 – 2015-08-26 (×6): 100 mg via ORAL
  Filled 2015-08-21 (×7): qty 1

## 2015-08-21 MED ORDER — ALUM & MAG HYDROXIDE-SIMETH 200-200-20 MG/5ML PO SUSP
30.0000 mL | ORAL | Status: DC | PRN
  Administered 2015-08-22: 30 mL via ORAL
  Filled 2015-08-21: qty 30

## 2015-08-21 MED ORDER — LACTATED RINGERS IV SOLN
INTRAVENOUS | Status: DC | PRN
  Administered 2015-08-21 (×2): via INTRAVENOUS

## 2015-08-21 MED ORDER — HYDROMORPHONE HCL 1 MG/ML IJ SOLN
INTRAMUSCULAR | Status: AC
  Filled 2015-08-21: qty 1

## 2015-08-21 MED ORDER — CEFAZOLIN SODIUM-DEXTROSE 2-3 GM-% IV SOLR
2.0000 g | Freq: Four times a day (QID) | INTRAVENOUS | Status: AC
  Administered 2015-08-21 – 2015-08-22 (×2): 2 g via INTRAVENOUS
  Filled 2015-08-21 (×2): qty 50

## 2015-08-21 MED ORDER — DIPHENHYDRAMINE HCL 25 MG PO CAPS: 25.0000 mg | ORAL_CAPSULE | Freq: Four times a day (QID) | ORAL | Status: DC | PRN

## 2015-08-21 MED ORDER — POLYETHYLENE GLYCOL 3350 17 G PO PACK
17.0000 g | PACK | Freq: Two times a day (BID) | ORAL | Status: DC
  Administered 2015-08-22 – 2015-08-23 (×3): 17 g via ORAL

## 2015-08-21 MED ORDER — ONDANSETRON HCL 4 MG/2ML IJ SOLN
INTRAMUSCULAR | Status: AC
  Filled 2015-08-21: qty 2

## 2015-08-21 MED ORDER — CEFAZOLIN SODIUM-DEXTROSE 2-3 GM-% IV SOLR
INTRAVENOUS | Status: AC
  Filled 2015-08-21: qty 50

## 2015-08-21 MED ORDER — ONDANSETRON HCL 4 MG PO TABS: 4.0000 mg | ORAL_TABLET | Freq: Four times a day (QID) | ORAL | Status: DC | PRN

## 2015-08-21 MED ORDER — MIDAZOLAM HCL 5 MG/5ML IJ SOLN
INTRAMUSCULAR | Status: DC | PRN
  Administered 2015-08-21: 2 mg via INTRAVENOUS

## 2015-08-21 MED ORDER — OMEPRAZOLE 20 MG PO CPDR
40.0000 mg | DELAYED_RELEASE_CAPSULE | Freq: Every day | ORAL | Status: AC
  Administered 2015-08-22 – 2015-08-24 (×3): 40 mg via ORAL
  Filled 2015-08-21 (×3): qty 2

## 2015-08-21 MED ORDER — ACETAMINOPHEN 500 MG PO TABS
1000.0000 mg | ORAL_TABLET | Freq: Three times a day (TID) | ORAL | Status: DC
  Administered 2015-08-21 – 2015-08-27 (×17): 1000 mg via ORAL
  Filled 2015-08-21 (×23): qty 2

## 2015-08-21 MED ORDER — DEXAMETHASONE SODIUM PHOSPHATE 10 MG/ML IJ SOLN
10.0000 mg | Freq: Once | INTRAMUSCULAR | Status: DC
  Filled 2015-08-21: qty 1

## 2015-08-21 MED ORDER — PROPOFOL 10 MG/ML IV BOLUS
INTRAVENOUS | Status: AC
  Filled 2015-08-21: qty 60

## 2015-08-21 MED ORDER — POTASSIUM CHLORIDE ER 8 MEQ PO TBCR
8.0000 meq | EXTENDED_RELEASE_TABLET | Freq: Every day | ORAL | Status: DC
  Filled 2015-08-21: qty 1

## 2015-08-21 MED ORDER — DOCUSATE SODIUM 100 MG PO CAPS
100.0000 mg | ORAL_CAPSULE | Freq: Two times a day (BID) | ORAL | Status: DC
  Administered 2015-08-21 – 2015-08-26 (×10): 100 mg via ORAL

## 2015-08-21 MED ORDER — DEXAMETHASONE SODIUM PHOSPHATE 10 MG/ML IJ SOLN
INTRAMUSCULAR | Status: AC
  Filled 2015-08-21: qty 1

## 2015-08-21 MED ORDER — ZOLPIDEM TARTRATE 5 MG PO TABS
5.0000 mg | ORAL_TABLET | Freq: Every evening | ORAL | Status: DC | PRN
  Administered 2015-08-22 – 2015-08-26 (×6): 5 mg via ORAL
  Filled 2015-08-21 (×6): qty 1

## 2015-08-21 MED ORDER — ASPIRIN EC 325 MG PO TBEC
325.0000 mg | DELAYED_RELEASE_TABLET | Freq: Two times a day (BID) | ORAL | Status: DC
  Administered 2015-08-22 – 2015-08-27 (×11): 325 mg via ORAL
  Filled 2015-08-21 (×13): qty 1

## 2015-08-21 MED ORDER — LACTASE 3000 UNITS PO TABS
1.0000 | ORAL_TABLET | Freq: Every day | ORAL | Status: DC
  Administered 2015-08-21 – 2015-08-27 (×7): 3000 [IU] via ORAL
  Filled 2015-08-21 (×7): qty 1

## 2015-08-21 MED ORDER — TRANEXAMIC ACID 1000 MG/10ML IV SOLN
1000.0000 mg | Freq: Once | INTRAVENOUS | Status: AC
  Administered 2015-08-21: 1000 mg via INTRAVENOUS
  Filled 2015-08-21: qty 10

## 2015-08-21 MED ORDER — DEXTROSE 5 % IV SOLN
500.0000 mg | Freq: Four times a day (QID) | INTRAVENOUS | Status: DC | PRN
  Administered 2015-08-21: 500 mg via INTRAVENOUS
  Filled 2015-08-21 (×2): qty 5

## 2015-08-21 MED ORDER — PHENOL 1.4 % MT LIQD
1.0000 | OROMUCOSAL | Status: DC | PRN
  Filled 2015-08-21: qty 177

## 2015-08-21 MED ORDER — MEPERIDINE HCL 50 MG/ML IJ SOLN: 6.2500 mg | INTRAMUSCULAR | Status: DC | PRN

## 2015-08-21 MED ORDER — PROPOFOL 10 MG/ML IV BOLUS
INTRAVENOUS | Status: DC | PRN
  Administered 2015-08-21 (×2): 20 mg via INTRAVENOUS

## 2015-08-21 MED ORDER — CELECOXIB 200 MG PO CAPS
200.0000 mg | ORAL_CAPSULE | Freq: Two times a day (BID) | ORAL | Status: DC
  Administered 2015-08-21 – 2015-08-27 (×12): 200 mg via ORAL
  Filled 2015-08-21 (×15): qty 1

## 2015-08-21 MED ORDER — MONTELUKAST SODIUM 10 MG PO TABS
10.0000 mg | ORAL_TABLET | Freq: Every day | ORAL | Status: DC
  Administered 2015-08-21 – 2015-08-27 (×7): 10 mg via ORAL
  Filled 2015-08-21 (×7): qty 1

## 2015-08-21 MED ORDER — ZOLPIDEM TARTRATE 10 MG PO TABS: 10.0000 mg | ORAL_TABLET | Freq: Every evening | ORAL | Status: DC | PRN

## 2015-08-21 MED ORDER — SODIUM CHLORIDE 0.9 % IV SOLN
100.0000 mL/h | INTRAVENOUS | Status: DC
  Administered 2015-08-21 – 2015-08-22 (×2): 100 mL/h via INTRAVENOUS
  Filled 2015-08-21 (×4): qty 1000

## 2015-08-21 MED ORDER — BISACODYL 10 MG RE SUPP: 10.0000 mg | Freq: Every day | RECTAL | Status: DC | PRN

## 2015-08-21 MED ORDER — BUPIVACAINE HCL (PF) 0.5 % IJ SOLN
INTRAMUSCULAR | Status: AC
  Filled 2015-08-21: qty 30

## 2015-08-21 MED ORDER — METOCLOPRAMIDE HCL 10 MG PO TABS: 5.0000 mg | ORAL_TABLET | Freq: Three times a day (TID) | ORAL | Status: DC | PRN

## 2015-08-21 MED ORDER — LORATADINE 10 MG PO TABS
10.0000 mg | ORAL_TABLET | Freq: Every day | ORAL | Status: DC
  Administered 2015-08-22 – 2015-08-27 (×6): 10 mg via ORAL
  Filled 2015-08-21 (×7): qty 1

## 2015-08-21 MED ORDER — NADOLOL 40 MG PO TABS
40.0000 mg | ORAL_TABLET | Freq: Every day | ORAL | Status: DC
  Administered 2015-08-22 – 2015-08-27 (×6): 40 mg via ORAL
  Filled 2015-08-21 (×6): qty 1

## 2015-08-21 MED ORDER — HYDROCORTISONE 1 % EX CREA
1.0000 "application " | TOPICAL_CREAM | Freq: Every evening | CUTANEOUS | Status: DC | PRN
  Filled 2015-08-21: qty 28

## 2015-08-21 MED ORDER — DEXAMETHASONE SODIUM PHOSPHATE 10 MG/ML IJ SOLN
10.0000 mg | Freq: Once | INTRAMUSCULAR | Status: AC
  Administered 2015-08-21: 10 mg via INTRAVENOUS

## 2015-08-21 MED ORDER — METOCLOPRAMIDE HCL 5 MG/ML IJ SOLN: 5.0000 mg | Freq: Three times a day (TID) | INTRAMUSCULAR | Status: DC | PRN

## 2015-08-21 MED ORDER — DOXEPIN HCL 50 MG PO CAPS
50.0000 mg | ORAL_CAPSULE | Freq: Two times a day (BID) | ORAL | Status: DC | PRN
  Filled 2015-08-21: qty 1

## 2015-08-21 MED ORDER — NON FORMULARY: 40.0000 mg | Freq: Every day | Status: DC

## 2015-08-21 MED ORDER — DULOXETINE HCL 60 MG PO CPEP
60.0000 mg | ORAL_CAPSULE | Freq: Every day | ORAL | Status: DC
  Administered 2015-08-21 – 2015-08-27 (×7): 60 mg via ORAL
  Filled 2015-08-21 (×7): qty 1

## 2015-08-21 MED ORDER — LUBIPROSTONE 24 MCG PO CAPS
24.0000 ug | ORAL_CAPSULE | Freq: Every day | ORAL | Status: DC
  Administered 2015-08-22 – 2015-08-27 (×6): 24 ug via ORAL
  Filled 2015-08-21 (×7): qty 1

## 2015-08-21 MED ORDER — ARMODAFINIL 150 MG PO TABS: 1.0000 | ORAL_TABLET | Freq: Every day | ORAL | Status: DC | PRN

## 2015-08-21 MED ORDER — FERROUS SULFATE 325 (65 FE) MG PO TABS
325.0000 mg | ORAL_TABLET | Freq: Three times a day (TID) | ORAL | Status: DC
  Administered 2015-08-22 – 2015-08-27 (×12): 325 mg via ORAL
  Filled 2015-08-21 (×19): qty 1

## 2015-08-21 MED ORDER — ONDANSETRON HCL 4 MG/2ML IJ SOLN
4.0000 mg | Freq: Once | INTRAMUSCULAR | Status: DC | PRN
Start: 2015-08-21 — End: 2015-08-21

## 2015-08-21 MED ORDER — CHLORHEXIDINE GLUCONATE 4 % EX LIQD: 60.0000 mL | Freq: Once | CUTANEOUS | Status: DC

## 2015-08-21 MED ORDER — MENTHOL 3 MG MT LOZG: 1.0000 | LOZENGE | OROMUCOSAL | Status: DC | PRN

## 2015-08-21 MED ORDER — HYDROMORPHONE HCL 1 MG/ML IJ SOLN
0.2500 mg | INTRAMUSCULAR | Status: DC | PRN
  Administered 2015-08-21 (×3): 0.5 mg via INTRAVENOUS

## 2015-08-21 MED ORDER — PROPOFOL 10 MG/ML IV BOLUS
INTRAVENOUS | Status: AC
  Filled 2015-08-21: qty 20

## 2015-08-21 MED ORDER — CEFAZOLIN SODIUM-DEXTROSE 2-3 GM-% IV SOLR
2.0000 g | INTRAVENOUS | Status: AC
  Administered 2015-08-21: 2 g via INTRAVENOUS

## 2015-08-21 SURGICAL SUPPLY — 36 items
BAG DECANTER FOR FLEXI CONT (MISCELLANEOUS) IMPLANT
BAG SPEC THK2 15X12 ZIP CLS (MISCELLANEOUS)
BAG ZIPLOCK 12X15 (MISCELLANEOUS) IMPLANT
CAPT HIP TOTAL 2 ×2 IMPLANT
CLOTH BEACON ORANGE TIMEOUT ST (SAFETY) ×3 IMPLANT
COVER PERINEAL POST (MISCELLANEOUS) ×3 IMPLANT
DRAPE STERI IOBAN 125X83 (DRAPES) ×3 IMPLANT
DRAPE U-SHAPE 47X51 STRL (DRAPES) ×6 IMPLANT
DRSG AQUACEL AG ADV 3.5X10 (GAUZE/BANDAGES/DRESSINGS) ×3 IMPLANT
DURAPREP 26ML APPLICATOR (WOUND CARE) ×3 IMPLANT
ELECT REM PT RETURN 15FT ADLT (MISCELLANEOUS) IMPLANT
ELECT REM PT RETURN 9FT ADLT (ELECTROSURGICAL) ×3
ELECTRODE REM PT RTRN 9FT ADLT (ELECTROSURGICAL) ×1 IMPLANT
GLOVE BIOGEL M 7.0 STRL (GLOVE) IMPLANT
GLOVE BIOGEL M STRL SZ7.5 (GLOVE) IMPLANT
GLOVE BIOGEL PI IND STRL 7.5 (GLOVE) ×1 IMPLANT
GLOVE BIOGEL PI IND STRL 8.5 (GLOVE) ×1 IMPLANT
GLOVE BIOGEL PI INDICATOR 7.5 (GLOVE) ×2
GLOVE BIOGEL PI INDICATOR 8.5 (GLOVE) ×2
GLOVE ECLIPSE 8.0 STRL XLNG CF (GLOVE) ×6 IMPLANT
GLOVE ORTHO TXT STRL SZ7.5 (GLOVE) ×3 IMPLANT
GOWN STRL REUS W/TWL LRG LVL3 (GOWN DISPOSABLE) ×3 IMPLANT
GOWN STRL REUS W/TWL XL LVL3 (GOWN DISPOSABLE) ×3 IMPLANT
HOLDER FOLEY CATH W/STRAP (MISCELLANEOUS) ×3 IMPLANT
LIQUID BAND (GAUZE/BANDAGES/DRESSINGS) ×3 IMPLANT
PACK ANTERIOR HIP CUSTOM (KITS) ×3 IMPLANT
SAW OSC TIP CART 19.5X105X1.3 (SAW) ×3 IMPLANT
SLEEVE SURGEON STRL (DRAPES) ×2 IMPLANT
SUT MNCRL AB 4-0 PS2 18 (SUTURE) ×3 IMPLANT
SUT VIC AB 1 CT1 36 (SUTURE) ×9 IMPLANT
SUT VIC AB 2-0 CT1 27 (SUTURE) ×6
SUT VIC AB 2-0 CT1 TAPERPNT 27 (SUTURE) ×2 IMPLANT
SUT VLOC 180 0 24IN GS25 (SUTURE) ×3 IMPLANT
TRAY FOLEY W/METER SILVER 14FR (SET/KITS/TRAYS/PACK) ×2 IMPLANT
TRAY FOLEY W/METER SILVER 16FR (SET/KITS/TRAYS/PACK) IMPLANT
WATER STERILE IRR 1500ML POUR (IV SOLUTION) ×3 IMPLANT

## 2015-08-21 NOTE — Interval H&P Note (Signed)
History and Physical Interval Note:  08/21/2015 11:29 AM  Kristin Pope  has presented today for surgery, with the diagnosis of RIGHT HIP OA  The various methods of treatment have been discussed with the patient and family. After consideration of risks, benefits and other options for treatment, the patient has consented to  Procedure(s): RIGHT TOTAL HIP ARTHROPLASTY ANTERIOR APPROACH (Right) as a surgical intervention .  The patient's history has been reviewed, patient examined, no change in status, stable for surgery.  I have reviewed the patient's chart and labs.  Questions were answered to the patient's satisfaction.     [name]

## 2015-08-21 NOTE — Anesthesia Postprocedure Evaluation (Signed)
Anesthesia Post Note  Patient: Kristin Pope  Procedure(s) Performed: Procedure(s) (LRB): RIGHT TOTAL HIP ARTHROPLASTY ANTERIOR APPROACH (Right)  Patient location during evaluation: PACU Anesthesia Type: Spinal and MAC Level of consciousness: awake and alert Pain management: pain level controlled Vital Signs Assessment: post-procedure vital signs reviewed and stable Respiratory status: spontaneous breathing and respiratory function stable Cardiovascular status: blood pressure returned to baseline and stable Postop Assessment: Spinal receding Anesthetic complications: no    Last Vitals:  Filed Vitals:   08/21/15 1637 08/21/15 1731  BP: 148/88 125/77  Pulse: 62 64  Temp: 36.7 C 36.8 C  Resp: 12 14    Last Pain:  Filed Vitals:   08/21/15 1731  PainSc: 3     LLE Motor Response: Purposeful movement LLE Sensation: Decreased RLE Motor Response: Purposeful movement RLE Sensation: Decreased L Sensory Level: L4-Anterior knee, lower leg R Sensory Level: L4-Anterior knee, lower leg  Naveen Lorusso DAVID

## 2015-08-21 NOTE — Transfer of Care (Signed)
Immediate Anesthesia Transfer of Care Note  Patient: Kristin Pope  Procedure(s) Performed: Procedure(s): RIGHT TOTAL HIP ARTHROPLASTY ANTERIOR APPROACH (Right)  Patient Location: PACU  Anesthesia Type:MAC and Spinal  Level of Consciousness: awake, alert , oriented and patient cooperative  Airway & Oxygen Therapy: Patient Spontanous Breathing and Patient connected to face mask oxygen  Post-op Assessment: Report given to RN and Post -op Vital signs reviewed and stable  Post vital signs: Reviewed and stable  Last Vitals:  Filed Vitals:   08/21/15 1114  BP: 144/72  Pulse: 61  Temp: 36.8 C  Resp: 18    Complications: No apparent anesthesia complications

## 2015-08-21 NOTE — Anesthesia Preprocedure Evaluation (Signed)
Anesthesia Evaluation  Patient identified by MRN, date of birth, ID band Patient awake    Reviewed: Allergy & Precautions, NPO status , Patient's Chart, lab work & pertinent test results  Airway Mallampati: I  TM Distance: >3 FB Neck ROM: Full    Dental   Pulmonary sleep apnea , former smoker,    Pulmonary exam normal        Cardiovascular hypertension, Pt. on medications Normal cardiovascular exam     Neuro/Psych Anxiety Depression    GI/Hepatic   Endo/Other  diabetes, Type 2Hypothyroidism   Renal/GU      Musculoskeletal   Abdominal   Peds  Hematology   Anesthesia Other Findings   Reproductive/Obstetrics                             Anesthesia Physical Anesthesia Plan  ASA: II  Anesthesia Plan: Spinal   Post-op Pain Management:    Induction: Intravenous  Airway Management Planned: Simple Face Mask  Additional Equipment:   Intra-op Plan:   Post-operative Plan:   Informed Consent: I have reviewed the patients History and Physical, chart, labs and discussed the procedure including the risks, benefits and alternatives for the proposed anesthesia with the patient or authorized representative who has indicated his/her understanding and acceptance.     Plan Discussed with: CRNA and Surgeon  Anesthesia Plan Comments:         Anesthesia Quick Evaluation

## 2015-08-21 NOTE — Op Note (Signed)
NAME:  Kristin Pope                ACCOUNT NO.: [id number]      MEDICAL RECORD NO.: [id number]      FACILITY:  [facility]      PHYSICIAN:  Paralee Cancel D  DATE OF BIRTH:  16-Jan-1956     DATE OF PROCEDURE:  08/21/2015                                 OPERATIVE REPORT         PREOPERATIVE DIAGNOSIS: Right  hip osteoarthritis.      POSTOPERATIVE DIAGNOSIS:  Right hip osteoarthritis.  History of left total hip replacement     PROCEDURE:  Right total hip replacement through an anterior approach   utilizing DePuy THR system, component size 50mm pinnacle cup, a size 32+4 neutral   Altrex liner, a size 0 Hi Tri Lock stem with a 32+1 delta ceramic   ball.      SURGEON:  Pietro Cassis. Alvan Dame, M.D.      ASSISTANT:  Danae Orleans, PA-C      ANESTHESIA:  Spinal.      SPECIMENS:  None.      COMPLICATIONS:  None.      BLOOD LOSS:  300 cc     DRAINS:  None.      INDICATION OF THE PROCEDURE:  Kristin Pope is a 59 y.o. female who had   presented to office for evaluation of right hip pain.  Radiographs revealed   progressive degenerative changes with bone-on-bone   articulation to the  hip joint.  The patient had painful limited range of   motion significantly affecting their overall quality of life.  The patient was failing to    respond to conservative measures, and at this point was ready   to proceed with more definitive measures.  The patient has noted progressive   degenerative changes in his hip, progressive problems and dysfunction   with regarding the hip prior to surgery.  Consent was obtained for   benefit of pain relief.  Specific risk of infection, DVT, component   failure, dislocation, need for revision surgery, as well discussion of   the anterior versus posterior approach were reviewed.  Consent was   obtained for benefit of anterior pain relief through an anterior   approach.      PROCEDURE IN DETAIL:  The patient was brought to operative theater.    Once adequate anesthesia, preoperative antibiotics, 2gm of Ancef, 1 gm of Tranexamic Acid, 10mg  of Decadron administered.   The patient was positioned supine on the OSI Hanna table.  Once adequate   padding of boney process was carried out, we had predraped out the hip, and  used fluoroscopy to confirm orientation of the pelvis and position.      The right hip was then prepped and draped from proximal iliac crest to   mid thigh with shower curtain technique.      Time-out was performed identifying the patient, planned procedure, and   extremity.     An incision was then made 2 cm distal and lateral to the   anterior superior iliac spine extending over the orientation of the   tensor fascia lata muscle and sharp dissection was carried down to the   fascia of the muscle and protractor placed in the soft tissues.  The fascia was then incised.  The muscle belly was identified and swept   laterally and retractor placed along the superior neck.  Following   cauterization of the circumflex vessels and removing some pericapsular   fat, a second cobra retractor was placed on the inferior neck.  A third   retractor was placed on the anterior acetabulum after elevating the   anterior rectus.  A L-capsulotomy was along the line of the   superior neck to the trochanteric fossa, then extended proximally and   distally.  Tag sutures were placed and the retractors were then placed   intracapsular.  We then identified the trochanteric fossa and   orientation of my neck cut, confirmed this radiographically   and then made a neck osteotomy with the femur on traction.  The femoral   head was removed without difficulty or complication.  Traction was let   off and retractors were placed posterior and anterior around the   acetabulum.      The labrum and foveal tissue were debrided.  I began reaming with a 98mm   reamer and reamed up to 57mm reamer with good bony bed preparation and a 44mm   cup was  chosen.  The final 52mm Pinnacle cup was then impacted under fluoroscopy  to confirm the depth of penetration and orientation with respect to   abduction.  A screw was placed followed by the hole eliminator.  The final   32+4 neutral Altrex liner was impacted with good visualized rim fit.  The cup was positioned anatomically within the acetabular portion of the pelvis.      At this point, the femur was rolled at 80 degrees.  Further capsule was   released off the inferior aspect of the femoral neck.  I then   released the superior capsule proximally.  The hook was placed laterally   along the femur and elevated manually and held in position with the bed   hook.  The leg was then extended and adducted with the leg rolled to 100   degrees of external rotation.  Once the proximal femur was fully   exposed, I used a box osteotome to set orientation.  I then began   broaching with the starting chili pepper broach and passed this by hand and then broached up to just the 0 broach to match the other side.  With the 0 broach in place I chose a high offset neck and did a trial reduction.  The offset was appropriate, leg lengths   appeared to be equal matching the other side, confirmed radiographically.   Given these findings, I went ahead and dislocated the hip, repositioned all   retractors and positioned the right hip in the extended and abducted position.  The final 0 Hi Tri Lock stem was   chosen and it was impacted down to the level of neck cut.  Based on this   and the trial reduction, a 32+1 delta ceramic ball was chosen and   impacted onto a clean and dry trunnion, and the hip was reduced.  The   hip had been irrigated throughout the case again at this point.  I did   reapproximate the superior capsular leaflet to the anterior leaflet   using #1 Vicryl.  The fascia of the   tensor fascia lata muscle was then reapproximated using #1 Vicryl and #0 V-lock sutures.  The   remaining wound was closed  with 2-0 Vicryl and running 4-0 Monocryl.  The hip was cleaned, dried, and dressed sterilely using Dermabond and   Aquacel dressing.  She was then brought   to recovery room in stable condition tolerating the procedure well.    Danae Orleans, PA-C was present for the entirety of the case involved from   preoperative positioning, perioperative retractor management, general   facilitation of the case, as well as primary wound closure as assistant.            Pietro Cassis Alvan Dame, M.D.        08/21/2015 1:52 PM

## 2015-08-21 NOTE — Discharge Instructions (Signed)

## 2015-08-21 NOTE — Anesthesia Procedure Notes (Signed)
Spinal Patient location during procedure: OR Start time: 08/21/2015 12:30 PM End time: 08/21/2015 12:35 PM Staffing Anesthesiologist: Lillia Abed Performed by: anesthesiologist  Preanesthetic Checklist Completed: patient identified, site marked, surgical consent, pre-op evaluation, timeout performed, IV checked, risks and benefits discussed and monitors and equipment checked Spinal Block Patient position: sitting Prep: Betadine Patient monitoring: heart rate, cardiac monitor, continuous pulse ox and blood pressure Approach: right paramedian Location: L3-4 Injection technique: single-shot Needle Needle type: Quincke  Needle gauge: 24 G Needle length: 9 cm Needle insertion depth: 5 cm

## 2015-08-22 ENCOUNTER — Encounter (HOSPITAL_COMMUNITY): Payer: Self-pay | Admitting: Orthopedic Surgery

## 2015-08-22 LAB — BASIC METABOLIC PANEL
ANION GAP: 7 (ref 5–15)
BUN: 10 mg/dL (ref 6–20)
CHLORIDE: 100 mmol/L — AB (ref 101–111)
CO2: 29 mmol/L (ref 22–32)
Calcium: 9.4 mg/dL (ref 8.9–10.3)
Creatinine, Ser: 0.65 mg/dL (ref 0.44–1.00)
GFR calc Af Amer: >60 mL/min (ref >60)
GFR calc non Af Amer: >60 mL/min (ref >60)
GLUCOSE: 134 mg/dL — AB (ref 65–99)
POTASSIUM: 5.3 mmol/L — AB (ref 3.5–5.1)
Sodium: 136 mmol/L (ref 135–145)

## 2015-08-22 LAB — CBC
HEMATOCRIT: 33 % — AB (ref 36.0–46.0)
HEMOGLOBIN: 10.7 g/dL — AB (ref 12.0–15.0)
MCH: 31.8 pg (ref 26.0–34.0)
MCHC: 32.4 g/dL (ref 30.0–36.0)
MCV: 97.9 fL (ref 78.0–100.0)
Platelets: 332 10*3/uL (ref 150–400)
RBC: 3.37 MIL/uL — AB (ref 3.87–5.11)
RDW: 13.8 % (ref 11.5–15.5)
WBC: 9.8 10*3/uL (ref 4.0–10.5)

## 2015-08-22 MED ORDER — POLYETHYLENE GLYCOL 3350 17 G PO PACK
17.0000 g | PACK | Freq: Two times a day (BID) | ORAL | Status: DC
Start: 2015-08-22 — End: 2015-12-29

## 2015-08-22 MED ORDER — HYDROMORPHONE HCL 2 MG PO TABS: 2.0000 mg | ORAL_TABLET | ORAL | Status: DC | PRN

## 2015-08-22 MED ORDER — ASPIRIN 325 MG PO TBEC: 325.0000 mg | DELAYED_RELEASE_TABLET | Freq: Two times a day (BID) | ORAL | Status: AC

## 2015-08-22 MED ORDER — DOCUSATE SODIUM 100 MG PO CAPS: 100.0000 mg | ORAL_CAPSULE | Freq: Two times a day (BID) | ORAL | Status: DC

## 2015-08-22 MED ORDER — POTASSIUM CHLORIDE ER 10 MEQ PO TBCR
10.0000 meq | EXTENDED_RELEASE_TABLET | Freq: Every day | ORAL | Status: DC
  Administered 2015-08-23 – 2015-08-27 (×5): 10 meq via ORAL
  Filled 2015-08-22 (×6): qty 1

## 2015-08-22 MED ORDER — FERROUS SULFATE 325 (65 FE) MG PO TABS: 325.0000 mg | ORAL_TABLET | Freq: Three times a day (TID) | ORAL | Status: DC

## 2015-08-22 MED ORDER — METHOCARBAMOL 500 MG PO TABS: 500.0000 mg | ORAL_TABLET | Freq: Four times a day (QID) | ORAL | Status: DC | PRN

## 2015-08-22 MED ORDER — ACETAMINOPHEN 500 MG PO TABS: 1000.0000 mg | ORAL_TABLET | Freq: Three times a day (TID) | ORAL | Status: DC

## 2015-08-22 NOTE — Evaluation (Signed)
Occupational Therapy Evaluation Patient Details Name: Kristin Pope MRN: [id number] DOB: Nov 10, 1955 Today's Date: 08/22/2015    History of Present Illness L THR; s/p R THR 10/16   Clinical Impression   This 59 year old female was admitted for the above.  She was mod I with ADLs prior to admission.  Pt will benefit from skilled OT to increase safety and independence with adls. Goals in acute are for supervision level with adls and bathroom transfer.    Follow Up Recommendations  SNF    Equipment Recommendations  None recommended by OT    Recommendations for Other Services       Precautions / Restrictions Precautions Precautions: Fall Restrictions Weight Bearing Restrictions: No Other Position/Activity Restrictions: WBAT      Mobility Bed Mobility Overal bed mobility: Needs Assistance Bed Mobility: Supine to Sit     Supine to sit: Min assist     General bed mobility comments: oob  Transfers Overall transfer level: Needs assistance Equipment used: Rolling walker (2 wheeled) Transfers: Sit to/from Stand Sit to Stand: Min guard         General transfer comment: cues for LE management and use of UEs to self assist    Balance                                            ADL Overall ADL's : Needs assistance/impaired             Lower Body Bathing: Minimal assistance;Sit to/from stand       Lower Body Dressing: Moderate assistance;Sit to/from stand   Toilet Transfer: Min guard;Ambulation (recliner)             General ADL Comments: Pt was at rehab for her last hip and she plans to go to rehab again as she lives alone.  She did not need AE last time; she will benefit from this to increase safety and independence with adls.  Educated on and used Public affairs consultant during evaluation:  issued kit to her as she feels she will use it.  Pt had just used restroom with PT     Vision     Perception     Praxis      Pertinent  Vitals/Pain Pain Assessment: 0-10 Pain Score: 6  Pain Location: R hip (5) and back (6) Pain Descriptors / Indicators: Aching Pain Intervention(s): Limited activity within patient's tolerance;Monitored during session;Premedicated before session;Repositioned;Ice applied     Hand Dominance     Extremity/Trunk Assessment Upper Extremity Assessment Upper Extremity Assessment: Overall WFL for tasks assessed           Communication     Cognition Arousal/Alertness: Awake/alert Behavior During Therapy: WFL for tasks assessed/performed Overall Cognitive Status: Within Functional Limits for tasks assessed                     General Comments       Exercises       Shoulder Instructions      Home Living Family/patient expects to be discharged to:: Skilled nursing facility Living Arrangements: Alone                               Additional Comments: pt has a shower chair and RW      Prior Functioning/Environment Level  of Independence: Independent;Independent with assistive device(s)             OT Diagnosis: Acute pain   OT Problem List: Decreased strength;Decreased activity tolerance;Decreased knowledge of use of DME or AE;Pain   OT Treatment/Interventions: Self-care/ADL training;DME and/or AE instruction;Patient/family education    OT Goals(Current goals can be found in the care plan section) Acute Rehab OT Goals Patient Stated Goal: Rehab and then home alone OT Goal Formulation: With patient Time For Goal Achievement: 08/29/15 Potential to Achieve Goals: Good ADL Goals Pt Will Transfer to Toilet: with supervision;ambulating;regular height toilet Additional ADL Goal #1: pt will complete bathing and dressing with AE with supervision sit to stand  OT Frequency: Min 2X/week   Barriers to D/C:            Co-evaluation              End of Session    Activity Tolerance: Patient tolerated treatment well Patient left: in chair;with call  bell/phone within reach   Time: 1521-1535 OT Time Calculation (min): 14 min Charges:  OT General Charges $OT Visit: 1 Procedure OT Evaluation $Initial OT Evaluation Tier I: 1 Procedure G-Codes:    Lily Velasquez 08-23-15, 3:59 PM Lesle Chris, OTR/L [phone] 2015/08/23

## 2015-08-22 NOTE — NC FL2 (Signed)
[place] LEVEL OF CARE SCREENING TOOL     IDENTIFICATION  Patient Name: Kristin Pope Birthdate: 14-Sep-1956 Sex: female Admission Date (Current Location): 08/21/2015  [place] and [place] Number: Kathleen Argue and Oval Linsey [id number] [place] and Address:  [facility],  [place] [address], [place]      Provider Number: [id number]  Attending Physician Name and Address:  Paralee Cancel, MD  Relative Name and Phone Number:       Current Level of Care: Hospital Recommended Level of Care: [place] Prior Approval Number:    Date Approved/Denied:   PASRR Number: Expired   Resubmitted   Discharge Plan: SNF    Current Diagnoses: Patient Active Problem List   Diagnosis Date Noted  . GERD (gastroesophageal reflux disease) 07/23/2015  . Chronic pain 07/23/2015  . Obese 07/19/2015  . S/P left THA, AA 07/17/2015  . 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 Insomnia 03/03/2011  . OSA (obstructive sleep apnea) 03/03/2011  . Depression, major, recurrent ([place]) 12/02/2010  . CONSTIPATION, SLOW TRANSIT 10/04/2010  . DIVERTICULITIS, COLON, WITH PERFORATION 08/27/2010  . ABDOMINAL PAIN, LEFT LOWER QUADRANT 08/16/2010  . BACTERIAL VAGINITIS 05/28/2010  . IRRITABLE BOWEL SYNDROME 08/14/2009  . CYSTITIS, CHRONIC INTERSTITIAL 06/27/2009  . UNSPECIFIED HYPOTHYROIDISM 05/16/2009  . LIPOMA OF OTHER SPECIFIED SITES 04/13/2009  . BRUXISM 04/13/2009  . SYNCOPE 03/13/2009  . MRI, BRAIN, ABNORMAL 02/07/2009  . MUSCLE WEAKNESS (GENERALIZED) 12/20/2008  . CERUMEN IMPACTION, BILATERAL 09/18/2008  . CANDIDIASIS OF UNSPECIFIED SITE 08/30/2008  . UNSPECIFIED ALLERGIC ALVEOLITIS AND PNEUMONITIS 08/02/2008  . HIP PAIN, LEFT, CHRONIC 07/04/2008  . MALAISE AND FATIGUE 07/04/2008  . LOW BACK PAIN 05/26/2008  . CHEST PAIN, ATYPICAL 03/02/2008  . EXTRINSIC ASTHMA, WITH EXACERBATION 02/03/2008  . DIABETES MELLITUS, TYPE II, UNCONTROLLED 01/06/2008  .  PROTEINURIA 12/23/2007  . HYPERLIPIDEMIA 11/18/2007  . HYPERCALCEMIA 11/18/2007  . Essential hypertension 09/14/2007  . ANEMIA, B12 DEFICIENCY 05/18/2007  . 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 ALLERGIC RHINITIS 03/29/2007  . FIBROMYALGIA 03/29/2007    Orientation ACTIVITIES/SOCIAL BLADDER RESPIRATION    Self, Time, Situation, Place  Active Continent Normal  BEHAVIORAL SYMPTOMS/MOOD NEUROLOGICAL BOWEL NUTRITION STATUS      Continent Diet (Regular)  PHYSICIAN VISITS COMMUNICATION OF NEEDS Height & Weight Skin  30 days Verbally   165 lbs. Surgical wounds (Rt Hip)          AMBULATORY STATUS RESPIRATION    Assist extensive Normal      Personal Care Assistance Level of Assistance  Bathing, Feeding, Dressing Bathing Assistance: Limited assistance Feeding assistance: Independent Dressing Assistance: Limited assistance      Functional Limitations Info                [place]  PT (By licensed PT)                   Additional Factors Info  Code Status, Allergies, Psychotropic Code Status Info: Full Allergies Info: Morphine, CODEINE, FENTANYL, GLUTETHIMIDES,  LACTOSE INTOLERANCE (GI), LIPITOR ATORVASTATIN,   TETRACYCLINE,  TRAMADOL,  HYDROCODONE-ACETAMINOPHEN,  Psychotropic Info: Cymbalta, Klonopin,          Current Medications (08/22/2015): Current Facility-Administered Medications  Medication Dose Route Frequency Provider Last Rate Last Dose  . acetaminophen (TYLENOL) tablet 1,000 mg  1,000 mg Oral Q8H Danae Orleans, PA-C   1,000 mg at 08/22/15 [id number]  . alum & mag hydroxide-simeth (MAALOX/MYLANTA) 200-200-20 MG/5ML suspension 30 mL  30 mL Oral Q4H PRN  Danae Orleans, PA-C      . Armodafinil 150 mg  1 tablet Oral Daily PRN Danae Orleans, PA-C      . aspirin EC tablet 325 mg  325 mg Oral BID Danae Orleans, PA-C   325 mg at 08/22/15 0834  . azelastine (ASTELIN) 0.1 % nasal spray 1 spray  1 spray Each Nare BID PRN  Danae Orleans, PA-C      . bisacodyl (DULCOLAX) suppository 10 mg  10 mg Rectal Daily PRN Danae Orleans, PA-C      . budesonide-formoterol (SYMBICORT) 160-4.5 MCG/ACT inhaler 2 puff  2 puff Inhalation BID PRN Danae Orleans, PA-C      . celecoxib (CELEBREX) capsule 200 mg  200 mg Oral Q12H Danae Orleans, PA-C   200 mg at 08/22/15 [id number]  . clonazePAM (KLONOPIN) tablet 1 mg  1 mg Oral QID Danae Orleans, PA-C   1 mg at 08/22/15 1126  . dexamethasone (DECADRON) injection 10 mg  10 mg Intravenous Once Danae Orleans, PA-C      . diphenhydrAMINE (BENADRYL) capsule 25 mg  25 mg Oral Q6H PRN Danae Orleans, PA-C      . docusate sodium (COLACE) capsule 100 mg  100 mg Oral BID Danae Orleans, PA-C   100 mg at 08/22/15 [id number]  . doxepin (SINEQUAN) capsule 50 mg  50 mg Oral BID PRN Danae Orleans, PA-C      . DULoxetine (CYMBALTA) DR capsule 60 mg  60 mg Oral Daily Danae Orleans, PA-C   60 mg at 08/22/15 0951  . ferrous sulfate tablet 325 mg  325 mg Oral TID PC Danae Orleans, PA-C   325 mg at 08/22/15 [id number]  . hydrocortisone cream 1 % 1 application  1 application Topical QHS PRN Danae Orleans, PA-C      . HYDROmorphone (DILAUDID) injection 0.5-2 mg  0.5-2 mg Intravenous Q2H PRN Danae Orleans, PA-C   1 mg at 08/22/15 0018  . HYDROmorphone (DILAUDID) tablet 2-4 mg  2-4 mg Oral Q4H Danae Orleans, PA-C   4 mg at 08/22/15 [id number]  . lactase (LACTAID) tablet 3,000 Units  1 tablet Oral Daily Danae Orleans, PA-C   3,000 Units at 08/22/15 [phone]  . loratadine (CLARITIN) tablet 10 mg  10 mg Oral Daily Danae Orleans, PA-C   10 mg at 08/22/15 0834  . lubiprostone (AMITIZA) capsule 24 mcg  24 mcg Oral Q breakfast Danae Orleans, PA-C   24 mcg at 08/22/15 [id number]  . magnesium citrate solution 1 Bottle  1 Bottle Oral Once PRN Danae Orleans, PA-C      . menthol-cetylpyridinium (CEPACOL) lozenge 3 mg  1 lozenge Oral PRN Danae Orleans, PA-C       Or  . phenol (CHLORASEPTIC) mouth spray 1 spray  1 spray Mouth/Throat PRN  Danae Orleans, PA-C      . methocarbamol (ROBAXIN) tablet 500 mg  500 mg Oral Q6H PRN Danae Orleans, PA-C   500 mg at 08/22/15 1126   Or  . methocarbamol (ROBAXIN) 500 mg in dextrose 5 % 50 mL IVPB  500 mg Intravenous Q6H PRN Danae Orleans, PA-C   500 mg at 08/21/15 1524  . metoCLOPramide (REGLAN) tablet 5-10 mg  5-10 mg Oral Q8H PRN Danae Orleans, PA-C       Or  . metoCLOPramide (REGLAN) injection 5-10 mg  5-10 mg Intravenous Q8H PRN Danae Orleans, PA-C      . montelukast (SINGULAIR) tablet 10 mg  10 mg Oral Daily Danae Orleans, PA-C   10 mg at  08/22/15 0951  . nadolol (CORGARD) tablet 40 mg  40 mg Oral Daily Danae Orleans, PA-C   40 mg at 08/22/15 [id number]  . nitrofurantoin (MACRODANTIN) capsule 100 mg  100 mg Oral QHS Danae Orleans, PA-C   100 mg at 08/21/15 2203  . omeprazole (PRILOSEC) capsule 40 mg  40 mg Oral QAC breakfast Paralee Cancel, MD   40 mg at 08/22/15 [phone]  . ondansetron (ZOFRAN) tablet 4 mg  4 mg Oral Q6H PRN Danae Orleans, PA-C       Or  . ondansetron [facility]) injection 4 mg  4 mg Intravenous Q6H PRN Danae Orleans, PA-C      . polyethylene glycol (MIRALAX / GLYCOLAX) packet 17 g  17 g Oral BID Danae Orleans, PA-C   17 g at 08/22/15 [id number]  . potassium chloride (K-DUR) CR tablet 10 mEq  10 mEq Oral Daily Paralee Cancel, MD   10 mEq at 08/22/15 0837  . sodium chloride 0.9 % 1,000 mL with potassium chloride 10 mEq infusion  100 mL/hr Intravenous Continuous Danae Orleans, PA-C 100 mL/hr at 08/22/15 0442 100 mL/hr at 08/22/15 0442  . zolpidem (AMBIEN) tablet 5 mg  5 mg Oral QHS PRN Nilda Simmer, RPH   5 mg at 08/22/15 0018   Do not use this list as official medication orders. Please verify with discharge summary.  Discharge Medications:   Medication List    ASK your doctor about these medications        acetaminophen 650 MG CR tablet  Commonly known as:  TYLENOL  Take 650 mg by mouth every 8 (eight) hours as needed for pain.     azelastine 0.1 % nasal spray   Commonly known as:  ASTELIN  Place 1 spray into the nose 2 (two) times daily as needed for allergies. Use in each nostril as directed     budesonide-formoterol 160-4.5 MCG/ACT inhaler  Commonly known as:  SYMBICORT  Inhale 2 puffs into the lungs 2 (two) times daily as needed (FOR WHEEZING).     cholecalciferol 1000 UNITS tablet  Commonly known as:  VITAMIN D  Take 1,000 Units by mouth daily.     clonazePAM 1 MG tablet  Commonly known as:  KLONOPIN  Take 1 tablet (1 mg total) by mouth 4 (four) times daily.     conjugated estrogens vaginal cream  Commonly known as:  PREMARIN  Place 1 Applicatorful vaginally 2 (two) times a week.     doxepin 50 MG capsule  Commonly known as:  SINEQUAN  Take 50 mg by mouth 2 (two) times daily as needed (for sleep).     DULoxetine 60 MG capsule  Commonly known as:  CYMBALTA  Take 1 capsule (60 mg total) by mouth daily.     DULoxetine 60 MG capsule  Commonly known as:  CYMBALTA  Take 60 mg by mouth daily.     estazolam 2 MG tablet  Commonly known as:  PROSOM  Take 1 tablet (2 mg total) by mouth at bedtime.     ferrous sulfate 325 (65 FE) MG tablet  Take 1 tablet (325 mg total) by mouth 3 (three) times daily after meals.     fexofenadine 180 MG tablet  Commonly known as:  ALLEGRA  Take 180 mg by mouth daily as needed for allergies.     hydrocortisone cream 1 %  Apply 1 application topically at bedtime as needed for itching.     HYDROmorphone 2 MG tablet  Commonly known as:  DILAUDID  Take 1-2 tablets (2-4 mg total) by mouth every 4 (four) hours as needed for severe pain.     lactase 3000 UNITS tablet  Commonly known as:  LACTAID  Take 1 tablet by mouth daily.     loperamide 2 MG capsule  Commonly known as:  IMODIUM  Take 2 mg by mouth daily as needed for diarrhea or loose stools.     lubiprostone 24 MCG capsule  Commonly known as:  AMITIZA  Take 1 capsule (24 mcg total) by mouth daily with breakfast.     methocarbamol 500 MG  tablet  Commonly known as:  ROBAXIN  Take 500 mg by mouth every 6 (six) hours as needed for muscle spasms.     montelukast 10 MG tablet  Commonly known as:  SINGULAIR  Take 10 mg by mouth daily.     multivitamin with minerals Tabs tablet  Take 1 tablet by mouth daily.     nadolol 40 MG tablet  Commonly known as:  CORGARD  TAKE 1 TABLET EVERY DAY     nitrofurantoin 100 MG capsule  Commonly known as:  MACRODANTIN  Take 100 mg by mouth at bedtime.     NUVIGIL 150 MG tablet  Generic drug:  Armodafinil  TAKE 1 TABLET BY MOUTH EVERY DAY     omeprazole 40 MG capsule  Commonly known as:  PRILOSEC  Take 40 mg by mouth daily.     potassium chloride 8 MEQ tablet  Commonly known as:  KLOR-CON  1 tablet daily     pseudoephedrine-acetaminophen 30-500 MG Tabs tablet  Commonly known as:  TYLENOL SINUS  Take 1 tablet by mouth every 4 (four) hours as needed (FOR COLD).     tiZANidine 4 MG tablet  Commonly known as:  ZANAFLEX  Take 1 tablet (4 mg total) by mouth every 8 (eight) hours as needed.     VITAMIN B 12 PO  Take 5,000 mcg by mouth daily.     Vitamin D (Ergocalciferol) 50000 UNITS Caps capsule  Commonly known as:  DRISDOL  Take 50,000 Units by mouth every 7 (seven) days.     zolpidem 10 MG tablet  Commonly known as:  AMBIEN  Take 10 mg by mouth at bedtime as needed for sleep.        Relevant Imaging Results:  Relevant Lab Results:  Recent Labs    Additional Information SSN:     [id number]  Pete Pelt

## 2015-08-22 NOTE — Clinical Social Work Placement (Signed)
   CLINICAL SOCIAL WORK PLACEMENT  NOTE  Date:  08/22/2015  Patient Details  Name: Kristin Pope MRN: [id number] Date of Birth: [birth year]  Clinical Social Work is seeking post-discharge placement for this patient at the [place] level of care (*CSW will initial, date and re-position this form in  chart as items are completed):  Yes   Patient/family provided with [facility] Work Department's list of facilities offering this level of care within the geographic area requested by the patient (or if unable, by the patient's family).  Yes   Patient/family informed of their freedom to choose among providers that offer the needed level of care, that participate in Medicare, Medicaid or managed care program needed by the patient, have an available bed and are willing to accept the patient.  Yes   Patient/family informed of [facility]'s ownership interest in [facility] and [facility], as well as of the fact that they are under no obligation to receive care at these facilities.  PASRR submitted to EDS on 07/18/15     PASRR number received on 07/18/15     Existing PASRR number confirmed on 08/22/15     FL2 transmitted to all facilities in geographic area requested by pt/family on 08/22/15     FL2 transmitted to all facilities within larger geographic area on       Patient informed that his/her managed care company has contracts with or will negotiate with certain facilities, including the following:        No   Patient/family informed of bed offers received.  Patient chooses bed at       Physician recommends and patient chooses bed at      Patient to be transferred to   on  .  Patient to be transferred to facility by       Patient family notified on   of transfer.  Name of family member notified:        PHYSICIAN       Additional Comment:    _______________________________________________ Pete Pelt 08/22/2015, 11:27 AM

## 2015-08-22 NOTE — Progress Notes (Signed)
Physical Therapy Treatment Patient Details Name: Kristin Pope MRN: [id number] DOB: 1956/05/09 Today's Date: September 08, 2015    History of Present Illness L THR; s/p R THR 10/16    PT Comments    Pt progressing well with mobility.  Follow Up Recommendations  SNF     Equipment Recommendations  None recommended by PT    Recommendations for Other Services OT consult     Precautions / Restrictions Precautions Precautions: Fall Restrictions Weight Bearing Restrictions: No Other Position/Activity Restrictions: WBAT    Mobility  Bed Mobility Overal bed mobility: Needs Assistance Bed Mobility: Supine to Sit     Supine to sit: Min assist     General bed mobility comments: cues for sequence and use of L LE to self assist  Transfers Overall transfer level: Needs assistance Equipment used: Rolling walker (2 wheeled) Transfers: Sit to/from Stand Sit to Stand: Min guard         General transfer comment: cues for LE management and use of UEs to self assist  Ambulation/Gait Ambulation/Gait assistance: Min assist;Min guard Ambulation Distance (Feet): 100 Feet (twice and 20' into bathroom) Assistive device: Rolling walker (2 wheeled) Gait Pattern/deviations: Step-to pattern;Step-through pattern;Decreased step length - right;Decreased step length - left;Shuffle;Trunk flexed     General Gait Details: cues for posture, position from RW and initial sequence   Stairs            Wheelchair Mobility    Modified Rankin (Stroke Patients Only)       Balance                                    Cognition Arousal/Alertness: Awake/alert Behavior During Therapy: WFL for tasks assessed/performed Overall Cognitive Status: Within Functional Limits for tasks assessed                      Exercises      General Comments        Pertinent Vitals/Pain Pain Assessment: 0-10 Pain Score: 5  Pain Location: R hip Pain Descriptors / Indicators:  Aching;Sore Pain Intervention(s): Limited activity within patient's tolerance;Monitored during session;Premedicated before session;Ice applied    Home Living                      Prior Function            PT Goals (current goals can now be found in the care plan section) Acute Rehab PT Goals Patient Stated Goal: Rehab and then home alone PT Goal Formulation: With patient Time For Goal Achievement: 08/27/15 Potential to Achieve Goals: Good Progress towards PT goals: Progressing toward goals    Frequency  7X/week    PT Plan Current plan remains appropriate    Co-evaluation             End of Session Equipment Utilized During Treatment: Gait belt Activity Tolerance: Patient tolerated treatment well Patient left: Other (comment) (standing with OT)     Time: [id number] PT Time Calculation (min) (ACUTE ONLY): 23 min  Charges:  $Gait Training: 8-22 mins $Therapeutic Activity: 8-22 mins                    G Codes:      Kristin Pope Sep 08, 2015, 3:29 PM

## 2015-08-22 NOTE — Clinical Social Work Placement (Signed)
UPDATED INFORMATION      CLINICAL SOCIAL WORK PLACEMENT  NOTE  Date:  08/22/2015  Patient Details  Name: Kristin Pope MRN: [id number] Date of Birth: [birth year]  Clinical Social Work is seeking post-discharge placement for this patient at the [place] level of care (*CSW will initial, date and re-position this form in  chart as items are completed):  Yes   Patient/family provided with [facility] Work Department's list of facilities offering this level of care within the geographic area requested by the patient (or if unable, by the patient's family).  Yes   Patient/family informed of their freedom to choose among providers that offer the needed level of care, that participate in Medicare, Medicaid or managed care program needed by the patient, have an available bed and are willing to accept the patient.  Yes   Patient/family informed of [facility]'s ownership interest in [facility] and [facility], as well as of the fact that they are under no obligation to receive care at these facilities.  PASRR submitted to EDS on 08/22/15     PASRR number received on       Existing PASRR number confirmed on       FL2 transmitted to all facilities in geographic area requested by pt/family on 08/22/15     FL2 transmitted to all facilities within larger geographic area on       Patient informed that his/her managed care company has contracts with or will negotiate with certain facilities, including the following:        No   Patient/family informed of bed offers received.  Patient chooses bed at       Physician recommends and patient chooses bed at      Patient to be transferred to   on  .  Patient to be transferred to facility by       Patient family notified on   of transfer.  Name of family member notified:        PHYSICIAN       Additional Comment:    _______________________________________________ Pete Pelt 08/22/2015,  11:51 AM

## 2015-08-22 NOTE — Progress Notes (Signed)
     Subjective: 1 Day Post-Op Procedure(s) (LRB): RIGHT TOTAL HIP ARTHROPLASTY ANTERIOR APPROACH (Right)   Patient reports pain as mild, pain controlled. No events throughout the night.    Objective:   VITALS:   Filed Vitals:   08/22/15 0046 08/22/15 0455  BP: 145/68 137/76  Pulse: 64 73  Temp: 97.9 F (36.6 C) 97.7 F (36.5 C)  Resp: 16 16    Dorsiflexion/Plantar flexion intact Incision: dressing C/D/I No cellulitis present Compartment soft  LABS  Recent Labs  08/22/15 0403  HGB 10.7*  HCT 33.0*  WBC 9.8  PLT 332     Recent Labs  08/22/15 0403  NA 136  K 5.3*  BUN 10  CREATININE 0.65  GLUCOSE 134*     Assessment/Plan: 1 Day Post-Op Procedure(s) (LRB): RIGHT TOTAL HIP ARTHROPLASTY ANTERIOR APPROACH (Right) Foley cath d/c'ed Advance diet Up with therapy D/C IV fluids Discharge to SNF eventually, when ready  Obese (BMI 30-39.9) Estimated body mass index is 34.57 kg/(m^2) as calculated from the following:   Height as of this encounter: 4\' 10"  (1.473 m).   Weight as of this encounter: 75 kg (165 lb 5.5 oz). Patient also counseled that weight may inhibit the healing process Patient counseled that losing weight will help with future health issues  Hyperkalemia Patient's IV was stopped which contained potassium Patient's oral potassium was help for today. If normal, oral potassium can be resumed tomorrow, especially with her use of Lasix     [place] Pugh. Shavelle Runkel   PAC  08/22/2015, 9:23 AM

## 2015-08-22 NOTE — Discharge Summary (Signed)
Physician Discharge Summary  Patient ID: Kristin Pope MRN: [id number] DOB/AGE: 03/14/1956 59 y.o.  Admit date: 08/21/2015 Discharge date:  Tentative Date of Discharge 08/24/2015 - Pending insurance approval  Procedures:  Procedure(s) (LRB): RIGHT TOTAL HIP ARTHROPLASTY ANTERIOR APPROACH (Right)  Attending Physician:  Dr. Paralee Cancel   Admission Diagnoses:   Right hip primary OA / pain  Discharge Diagnoses:  Active Problems:   S/P left THA, AA  Past Medical History  Diagnosis Date  . Allergy   . Depression   . Fibromyalgia   . Hypertension   . Hyperlipidemia   . Low back pain   . Vaginitis     atropic-ongoing abnormal vaginal bleeding-had ultrasound and biopsy in last couple months  . Chronic fatigue   . Headache     constant headaches  . Arthritis     neck and spine, with bone spurs  . Torn rotator cuff     right shoulder  . Family history of adverse reaction to anesthesia     sister has problems waking up  . Reactive airway disease   . Diverticulitis 2011    HAD BOWEL PERFORATION  . Sleep apnea     does not use sleep - was told did not need cpap  . Abnormal brain MRI     per pt   . Hx: UTI (urinary tract infection)   . Anemia, pernicious   . Borderline diabetes     no meds  . Anxiety   . DDD (degenerative disc disease), cervical     ALSO LUMBAR AREA  . Hx of pyelonephritis 2007  . MVP (mitral valve prolapse)     HPI:    Kristin Pope, 59 y.o. female, has a history of pain and functional disability in the right hip(s) due to arthritis and patient has failed non-surgical conservative treatments for greater than 12 weeks to include NSAID's and/or analgesics, corticosteriod injections and activity modification. Onset of symptoms was gradual starting years ago with gradually worsening course since that time.The patient noted prior procedures of the hip to include arthroplasty on the left hip (07/17/2015 per Dr. Alvan Dame). Patient currently rates pain in the  right hip at 10 out of 10 with activity. Patient has worsening of pain with activity and weight bearing, trendelenberg gait, pain that interfers with activities of daily living and pain with passive range of motion. Patient has evidence of periarticular osteophytes and joint space narrowing by imaging studies. This condition presents safety issues increasing the risk of falls. There is no current active infection. Risks, benefits and expectations were discussed with the patient. Risks including but not limited to the risk of anesthesia, blood clots, nerve damage, blood vessel damage, failure of the prosthesis, infection and up to and including death. Patient understand the risks, benefits and expectations and wishes to proceed with surgery.   PCP: Marijean Bravo, MD   Discharged Condition: good  Hospital Course: Patient was admitted to [facility] and taken to the OR and underwent the above state procedure without complications.  Patient tolerated the procedure well and was later transferred to the recovery room and then to the orthopaedic floor for postoperative care.  They were given PO and IV analgesics for pain control following their surgery.  They were given 24 hours of postoperative antibiotics and started on DVT prophylaxis in the form of Aspirin 325 mg twice a day.   PT and OT were ordered for total hip protocol.  The patient was allowed  to be WBAT with therapy. Discharge planning was consulted to help with postop disposition and equipment needs.  Patient had a decent night on the evening of surgery.  They started to get up OOB with therapy on day one.  Hemovac drain was pulled without difficulty.  Continued to work with therapy into day two.  Dressing was checked and it was clean and dry.  By day three, the patient had progressed with therapy and meeting their goals.  Incision was healing well.  Patient was seen in rounds and was ready to go to the SNF.  At the time of this summary on  08/24/2015, Friday, insurance approval was pending.  This summary completed in anticipation of approval and then can transfer to SNF   Discharge Exam: General appearance: alert, cooperative and no distress Extremities: Homans sign is negative, no sign of DVT, no edema, redness or tenderness in the calves or thighs and no ulcers, gangrene or trophic changes  Disposition: 03-Skilled Nursing Facility with follow up in 2 weeks   Follow-up Information    Follow up with [name], MD. Schedule an appointment as soon as possible for a visit in 2 weeks.   Specialty:  Orthopedic Surgery   Contact information:   [address] [place] [address] [id number]       Discharge Instructions    Call MD / Call 911    Complete by:  As directed   If you experience chest pain or shortness of breath, CALL 911 and be transported to the hospital emergency room.  If you develope a fever above 101 F, pus (white drainage) or increased drainage or redness at the wound, or calf pain, call your surgeon's office.     Change dressing    Complete by:  As directed   Maintain surgical dressing until follow up in the clinic. If the edges start to pull up, may reinforce with tape. If the dressing is no longer working, may remove and cover with gauze and tape, but must keep the area dry and clean.  Call with any questions or concerns.     Constipation Prevention    Complete by:  As directed   Drink plenty of fluids.  Prune juice may be helpful.  You may use a stool softener, such as Colace (over the counter) 100 mg twice a day.  Use MiraLax (over the counter) for constipation as needed.     Diet - low sodium heart healthy    Complete by:  As directed      Discharge instructions    Complete by:  As directed   Maintain surgical dressing until follow up in the clinic. If the edges start to pull up, may reinforce with tape. If the dressing is no longer working, may remove and cover with gauze and  tape, but must keep the area dry and clean.  Follow up in 2 weeks at [facility]. Call with any questions or concerns.     Increase activity slowly as tolerated    Complete by:  As directed   Weight bearing as tolerated with assist device (walker, cane, etc) as directed, use it as long as suggested by your surgeon or therapist, typically at least 4-6 weeks.     TED hose    Complete by:  As directed   Use stockings (TED hose) for 2 weeks on both leg(s).  You may remove them at night for sleeping.  Medication List    STOP taking these medications        acetaminophen 650 MG CR tablet  Commonly known as:  TYLENOL  Replaced by:  acetaminophen 500 MG tablet     pseudoephedrine-acetaminophen 30-500 MG Tabs tablet  Commonly known as:  TYLENOL SINUS     tiZANidine 4 MG tablet  Commonly known as:  ZANAFLEX      TAKE these medications        acetaminophen 500 MG tablet  Commonly known as:  TYLENOL  Take 2 tablets (1,000 mg total) by mouth every 8 (eight) hours.     aspirin 325 MG EC tablet  Take 1 tablet (325 mg total) by mouth 2 (two) times daily.     azelastine 0.1 % nasal spray  Commonly known as:  ASTELIN  Place 1 spray into the nose 2 (two) times daily as needed for allergies. Use in each nostril as directed     budesonide-formoterol 160-4.5 MCG/ACT inhaler  Commonly known as:  SYMBICORT  Inhale 2 puffs into the lungs 2 (two) times daily as needed (FOR WHEEZING).     cholecalciferol 1000 UNITS tablet  Commonly known as:  VITAMIN D  Take 1,000 Units by mouth daily.     clonazePAM 1 MG tablet  Commonly known as:  KLONOPIN  Take 1 tablet (1 mg total) by mouth 4 (four) times daily.     conjugated estrogens vaginal cream  Commonly known as:  PREMARIN  Place 1 Applicatorful vaginally 2 (two) times a week.     docusate sodium 100 MG capsule  Commonly known as:  COLACE  Take 1 capsule (100 mg total) by mouth 2 (two) times daily.     doxepin 50 MG  capsule  Commonly known as:  SINEQUAN  Take 50 mg by mouth 2 (two) times daily as needed (for sleep).     DULoxetine 60 MG capsule  Commonly known as:  CYMBALTA  Take 60 mg by mouth daily.     estazolam 2 MG tablet  Commonly known as:  PROSOM  Take 1 tablet (2 mg total) by mouth at bedtime.     ferrous sulfate 325 (65 FE) MG tablet  Take 1 tablet (325 mg total) by mouth 3 (three) times daily after meals.     fexofenadine 180 MG tablet  Commonly known as:  ALLEGRA  Take 180 mg by mouth daily as needed for allergies.     hydrocortisone cream 1 %  Apply 1 application topically at bedtime as needed for itching.     HYDROmorphone 2 MG tablet  Commonly known as:  DILAUDID  Take 1-2 tablets (2-4 mg total) by mouth every 4 (four) hours as needed for severe pain.     lactase 3000 UNITS tablet  Commonly known as:  LACTAID  Take 1 tablet by mouth daily.     loperamide 2 MG capsule  Commonly known as:  IMODIUM  Take 2 mg by mouth daily as needed for diarrhea or loose stools.     lubiprostone 24 MCG capsule  Commonly known as:  AMITIZA  Take 1 capsule (24 mcg total) by mouth daily with breakfast.     methocarbamol 500 MG tablet  Commonly known as:  ROBAXIN  Take 1 tablet (500 mg total) by mouth every 6 (six) hours as needed for muscle spasms.     montelukast 10 MG tablet  Commonly known as:  SINGULAIR  Take 10 mg by mouth daily.  multivitamin with minerals Tabs tablet  Take 1 tablet by mouth daily.     nadolol 40 MG tablet  Commonly known as:  CORGARD  TAKE 1 TABLET EVERY DAY     nitrofurantoin 100 MG capsule  Commonly known as:  MACRODANTIN  Take 100 mg by mouth at bedtime.     NUVIGIL 150 MG tablet  Generic drug:  Armodafinil  TAKE 1 TABLET BY MOUTH EVERY DAY     omeprazole 40 MG capsule  Commonly known as:  PRILOSEC  Take 40 mg by mouth daily.     polyethylene glycol packet  Commonly known as:  MIRALAX / GLYCOLAX  Take 17 g by mouth 2 (two) times daily.       potassium chloride 8 MEQ tablet  Commonly known as:  KLOR-CON  1 tablet daily     VITAMIN B 12 PO  Take 5,000 mcg by mouth daily.     Vitamin D (Ergocalciferol) 50000 UNITS Caps capsule  Commonly known as:  DRISDOL  Take 50,000 Units by mouth every 7 (seven) days.     zolpidem 10 MG tablet  Commonly known as:  AMBIEN  Take 10 mg by mouth at bedtime as needed for sleep.         Signed: West Pugh. Babish   PA-C  08/22/2015, 11:05 PM

## 2015-08-22 NOTE — Evaluation (Signed)
Physical Therapy Evaluation Patient Details Name: Kristin Pope MRN: [id number] DOB: 05/18/1956 Today's Date: 08/22/2015   History of Present Illness  L THR; s/p R THR 10/16  Clinical Impression  Pt s/p L THR presents with decreased L LE strength/ROM and post op pain limiting functional mobility.  Pt would benefit from follow up rehab at SNF level to maximize IND and safety prior to return home ALONE.    Follow Up Recommendations SNF    Equipment Recommendations  None recommended by PT    Recommendations for Other Services OT consult     Precautions / Restrictions Precautions Precautions: Fall Restrictions Weight Bearing Restrictions: No Other Position/Activity Restrictions: WBAT      Mobility  Bed Mobility Overal bed mobility: Needs Assistance Bed Mobility: Supine to Sit;Sit to Supine     Supine to sit: Min assist Sit to supine: Min assist   General bed mobility comments: cues for sequence and use of L LE to self assist  Transfers Overall transfer level: Needs assistance Equipment used: Rolling walker (2 wheeled) Transfers: Sit to/from Stand Sit to Stand: Min assist         General transfer comment: cues for LE management and use of UEs to self assist  Ambulation/Gait Ambulation/Gait assistance: Min assist Ambulation Distance (Feet): 70 Feet Assistive device: Rolling walker (2 wheeled) Gait Pattern/deviations: Step-to pattern;Decreased step length - right;Decreased step length - left;Shuffle;Trunk flexed     General Gait Details: cues for posture, position from RW and initial sequence  Stairs            Wheelchair Mobility    Modified Rankin (Stroke Patients Only)       Balance                                             Pertinent Vitals/Pain Pain Assessment: 0-10 Pain Score: 5  Pain Location: R hip Pain Descriptors / Indicators: Aching;Sore Pain Intervention(s): Limited activity within patient's  tolerance;Monitored during session;Premedicated before session;Ice applied    Home Living Family/patient expects to be discharged to:: Skilled nursing facility Living Arrangements: Alone               Additional Comments: pt has a shower chair and RW    Prior Function Level of Independence: Independent;Independent with assistive device(s)               Hand Dominance   Dominant Hand: Right    Extremity/Trunk Assessment   Upper Extremity Assessment: Overall WFL for tasks assessed           Lower Extremity Assessment: RLE deficits/detail RLE Deficits / Details: Strength at hip 3-/5 with AAROM at hip to 90 flex and 15 abd    Cervical / Trunk Assessment: Normal  Communication   Communication: No difficulties  Cognition Arousal/Alertness: Awake/alert Behavior During Therapy: WFL for tasks assessed/performed Overall Cognitive Status: Within Functional Limits for tasks assessed                      General Comments      Exercises Total Joint Exercises Ankle Circles/Pumps: AROM;Both;15 reps;Supine Quad Sets: AROM;Both;10 reps;Supine Heel Slides: AAROM;Right;15 reps;Supine Hip ABduction/ADduction: AAROM;Right;15 reps;Supine      Assessment/Plan    PT Assessment Patient needs continued PT services  PT Diagnosis Difficulty walking   PT Problem List Decreased strength;Decreased range of motion;Decreased activity tolerance;Decreased  mobility;Decreased knowledge of use of DME;Pain  PT Treatment Interventions DME instruction;Gait training;Functional mobility training;Therapeutic activities;Therapeutic exercise;Patient/family education   PT Goals (Current goals can be found in the Care Plan section) Acute Rehab PT Goals Patient Stated Goal: Rehab and then home alone PT Goal Formulation: With patient Time For Goal Achievement: 08/27/15 Potential to Achieve Goals: Good    Frequency 7X/week   Barriers to discharge        Co-evaluation                End of Session Equipment Utilized During Treatment: Gait belt Activity Tolerance: Patient tolerated treatment well Patient left: in bed;with call bell/phone within reach Nurse Communication: Mobility status         Time: [id number] PT Time Calculation (min) (ACUTE ONLY): 28 min   Charges:   PT Evaluation $Initial PT Evaluation Tier I: 1 Procedure PT Treatments $Therapeutic Exercise: 8-22 mins   PT G Codes:        Phaedra Colgate 08-26-15, 1:55 PM

## 2015-08-22 NOTE — Clinical Social Work Note (Signed)
Clinical Social Work Assessment  Patient Details  Name: Kristin Pope MRN: [id number] Date of Birth: 1956/01/23  Date of referral:  08/22/15               Reason for consult:  Discharge Planning                Permission sought to share information with:  Facility Art therapist granted to share information::  Yes, Verbal Permission Granted  Name::     [place] and [employer] area  Agency::     Relationship::  mother   Contact Information:  Lorre Nick  [phone]  Housing/Transportation Living arrangements for the past 2 months:  [place] of Information:  Patient Patient Interpreter Needed:  None Criminal Activity/Legal Involvement Pertinent to Current Situation/Hospitalization:  No - Comment as needed Significant Relationships:  Significant Other, Parents Lives with:  Self Do you feel safe going back to the place where you live?  No (Needs rehab) Need for family participation in patient care:  Yes (Comment) (Can Contact Pt's mother if needed. )  Care giving concerns:  Pt is aware that SNF is needed for rehab.    Social Worker assessment / plan: CSW met with the Pt at the bedside to discuss d/c planning to SNF.  Pt stated she is aware of the process as she had received the same surgery on the opposite hip on July 17, 2015. Pt then transferred to [facility] and does not want to return to that facility. Pt stated that her fiance` lives in [facility] [place] and the Pt would like to try and go to that county if possible, however the Pt did give permission for CSW to also send Pt information to [employer] area. Pt does state that she lives alone but has some support from her mother and sister. Pt gave permission for CSW to speak with the Pt's mother if necessary. Pt is concerned about coverage (Medicaid). Pt is aware that she needs to stay 30 days or more for coverage and that she will not get PT coverage from Medicaid. Pt remains  agreeable to placement.   Employment status:  Disabled (Comment on whether or not currently receiving Disability) Insurance information:  Medicaid [place] PT Recommendations:  [place] / Referral to community resources:  [place]  Patient/Family's Response to care: Pt is appreciative for assistance with placement and treatment.   Patient/Family's Understanding of and Emotional Response to Diagnosis, Current Treatment, and Prognosis: Pt has a vast knowledge of recovery process and time.  Emotional Assessment Appearance:  Appears stated age Attitude/Demeanor/Rapport:    Affect (typically observed):  Accepting, Flat, Stable Orientation:  Oriented to Self, Oriented to Place, Oriented to  Time, Oriented to Situation Alcohol / Substance use:  Never Used Psych involvement (Current and /or in the community):  No (Comment)  Discharge Needs  Concerns to be addressed:  Discharge Planning Concerns Readmission within the last 30 days:    Current discharge risk:  Dependent with Mobility Barriers to Discharge:      Pete Pelt 08/22/2015, 11:04 AM

## 2015-08-23 LAB — BASIC METABOLIC PANEL
Anion gap: 6 (ref 5–15)
BUN: 10 mg/dL (ref 6–20)
CO2: 29 mmol/L (ref 22–32)
CREATININE: 0.75 mg/dL (ref 0.44–1.00)
Calcium: 8.7 mg/dL — ABNORMAL LOW (ref 8.9–10.3)
Chloride: 100 mmol/L — ABNORMAL LOW (ref 101–111)
Glucose, Bld: 108 mg/dL — ABNORMAL HIGH (ref 65–99)
POTASSIUM: 4.2 mmol/L (ref 3.5–5.1)
SODIUM: 135 mmol/L (ref 135–145)

## 2015-08-23 LAB — CBC
HCT: 30.1 % — ABNORMAL LOW (ref 36.0–46.0)
Hemoglobin: 9.5 g/dL — ABNORMAL LOW (ref 12.0–15.0)
MCH: 30.6 pg (ref 26.0–34.0)
MCHC: 31.6 g/dL (ref 30.0–36.0)
MCV: 97.1 fL (ref 78.0–100.0)
PLATELETS: 283 10*3/uL (ref 150–400)
RBC: 3.1 MIL/uL — AB (ref 3.87–5.11)
RDW: 14.1 % (ref 11.5–15.5)
WBC: 9.2 10*3/uL (ref 4.0–10.5)

## 2015-08-23 NOTE — Progress Notes (Signed)
Patient ID: Kristin Pope, female   DOB: 23-Apr-1956, 59 y.o.   MRN: [id number] Subjective: 2 Days Post-Op Procedure(s) (LRB): RIGHT TOTAL HIP ARTHROPLASTY ANTERIOR APPROACH (Right)    Patient reports pain as mild.  Awaiting disposition decision, needs SNF  Objective:   VITALS:   Filed Vitals:   08/22/15 2205 08/23/15 0452  BP: 131/71 134/76  Pulse: 77 71  Temp: 98.3 F (36.8 C) 98 F (36.7 C)  Resp: 20 17    Neurovascular intact Incision: dressing C/D/I  LABS  Recent Labs  08/22/15 0403 08/23/15 0430  HGB 10.7* 9.5*  HCT 33.0* 30.1*  WBC 9.8 9.2  PLT 332 283     Recent Labs  08/22/15 0403 08/23/15 0430  NA 136 135  K 5.3* 4.2  BUN 10 10  CREATININE 0.65 0.75  GLUCOSE 134* 108*    No results for input(s): LABPT, INR in the last 72 hours.   Assessment/Plan: 2 Days Post-Op Procedure(s) (LRB): RIGHT TOTAL HIP ARTHROPLASTY ANTERIOR APPROACH (Right)   Advance diet Plan for discharge tomorrow Discharge to SNF when possible, SW still to arrange

## 2015-08-23 NOTE — Progress Notes (Signed)
Physical Therapy Treatment Patient Details Name: Kristin Pope MRN: [id number] DOB: [birth year] Today's Date: 08/23/2015    History of Present Illness L THR; s/p R THR 10/16    PT Comments    Pt progressing well with mobility but expressing concerns regarding disposition.  Follow Up Recommendations  SNF     Equipment Recommendations  None recommended by PT    Recommendations for Other Services OT consult     Precautions / Restrictions Precautions Precautions: Fall Restrictions Weight Bearing Restrictions: No Other Position/Activity Restrictions: WBAT    Mobility  Bed Mobility Overal bed mobility: Needs Assistance Bed Mobility: Supine to Sit;Sit to Supine     Supine to sit: Min guard Sit to supine: Min guard   General bed mobility comments: min cues for sequence and use of L LE to self assist  Transfers Overall transfer level: Needs assistance Equipment used: Rolling walker (2 wheeled) Transfers: Sit to/from Stand Sit to Stand: Min guard         General transfer comment: cues for LE management and use of UEs to self assist  Ambulation/Gait Ambulation/Gait assistance: Min guard Ambulation Distance (Feet): 150 Feet (and 15' from bathroom) Assistive device: Rolling walker (2 wheeled) Gait Pattern/deviations: Step-to pattern;Step-through pattern;Decreased step length - right;Decreased step length - left;Shuffle;Trunk flexed     General Gait Details: cues for posture, position from RW and initial sequence   Stairs            Wheelchair Mobility    Modified Rankin (Stroke Patients Only)       Balance                                    Cognition Arousal/Alertness: Awake/alert Behavior During Therapy: WFL for tasks assessed/performed Overall Cognitive Status: Within Functional Limits for tasks assessed                      Exercises Total Joint Exercises Ankle Circles/Pumps: AROM;Both;15 reps;Supine Quad Sets:  AROM;Both;10 reps;Supine Gluteal Sets: AROM;Both;10 reps;Supine Heel Slides: AAROM;Right;Supine;20 reps Hip ABduction/ADduction: AAROM;Right;15 reps;Supine Long Arc Quad: AROM;Left;10 reps;Seated    General Comments        Pertinent Vitals/Pain Pain Assessment: 0-10 Pain Score: 5  Pain Location: R hip Pain Descriptors / Indicators: Aching;Sore Pain Intervention(s): Limited activity within patient's tolerance;Monitored during session;Premedicated before session;Ice applied    Home Living                      Prior Function            PT Goals (current goals can now be found in the care plan section) Acute Rehab PT Goals Patient Stated Goal: Rehab and then home alone PT Goal Formulation: With patient Time For Goal Achievement: 08/27/15 Potential to Achieve Goals: Good Progress towards PT goals: Progressing toward goals    Frequency  7X/week    PT Plan Current plan remains appropriate    Co-evaluation             End of Session Equipment Utilized During Treatment: Gait belt Activity Tolerance: Patient tolerated treatment well Patient left: in bed;with call bell/phone within reach     Time: 1200-1232 PT Time Calculation (min) (ACUTE ONLY): 32 min  Charges:  $Gait Training: 8-22 mins $Therapeutic Exercise: 8-22 mins  G Codes:      Kristin Pope August 24, 2015, 3:54 PM

## 2015-08-24 MED ORDER — MODAFINIL 200 MG PO TABS: 200.0000 mg | ORAL_TABLET | Freq: Every day | ORAL | Status: DC | PRN

## 2015-08-24 NOTE — Progress Notes (Signed)
Patient ID: Kristin Pope, female   DOB: 13-Jun-1956, 59 y.o.   MRN: [id number] Subjective: 3 Days Post-Op Procedure(s) (LRB): RIGHT TOTAL HIP ARTHROPLASTY ANTERIOR APPROACH (Right)    Patient reports pain as mild to moderate.  Progressing with therapy.  Social and living and living arrangements biggest issue currently  Objective:   VITALS:   Filed Vitals:   08/23/15 2020 08/24/15 0516  BP: 106/52 124/57  Pulse: 65 65  Temp: 97.8 F (36.6 C) 98 F (36.7 C)  Resp: 17 16    Neurovascular intact Incision: dressing C/D/I  LABS  Recent Labs  08/22/15 0403 08/23/15 0430  HGB 10.7* 9.5*  HCT 33.0* 30.1*  WBC 9.8 9.2  PLT 332 283     Recent Labs  08/22/15 0403 08/23/15 0430  NA 136 135  K 5.3* 4.2  BUN 10 10  CREATININE 0.65 0.75  GLUCOSE 134* 108*    No results for input(s): LABPT, INR in the last 72 hours.   Assessment/Plan: 3 Days Post-Op Procedure(s) (LRB): RIGHT TOTAL HIP ARTHROPLASTY ANTERIOR APPROACH (Right)   Up with therapy, continue to work on progress while in hospital Discharge home versus SNF based on social work issues

## 2015-08-24 NOTE — Progress Notes (Signed)
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  Follow Up Recommendations        Equipment Recommendations       Recommendations for Other Services       Precautions / Restrictions Precautions Precautions: Fall Restrictions Weight Bearing Restrictions: No Other Position/Activity Restrictions: WBAT    Mobility  Bed Mobility Overal bed mobility: Needs Assistance Bed Mobility: Sit to Supine       Sit to supine: Min assist   General bed mobility comments: min cues for sequence and use of L LE to self assist  Transfers Overall transfer level: Needs assistance Equipment used: Rolling walker (2 wheeled) Transfers: Sit to/from Stand Sit to Stand: Supervision         General transfer comment: cues for LE management and use of UEs to self assist  Ambulation/Gait Ambulation/Gait assistance: Min guard;Supervision Ambulation Distance (Feet): 200 Feet Assistive device: Rolling walker (2 wheeled) Gait Pattern/deviations: Step-to pattern;Step-through pattern;Decreased step length - right;Decreased step length - left;Shuffle;Trunk flexed     General Gait Details: cues for posture, position from RW and initial sequence   Stairs            Wheelchair Mobility    Modified Rankin (Stroke Patients Only)       Balance                                    Cognition Arousal/Alertness: Awake/alert Behavior During Therapy: WFL for tasks assessed/performed Overall Cognitive Status: Within Functional Limits for tasks assessed                      Exercises Total Joint Exercises Ankle Circles/Pumps: AROM;Both;15 reps;Supine Quad Sets: AROM;Both;10 reps;Supine Gluteal Sets: AROM;Both;10 reps;Supine Heel  Slides: AAROM;Right;Supine;20 reps Hip ABduction/ADduction: AAROM;Right;15 reps;Supine Long Arc Quad: AROM;Left;10 reps;Seated    General Comments        Pertinent Vitals/Pain Pain Assessment: 0-10 Pain Score: 5  Pain Location: R hip Pain Descriptors / Indicators: Aching;Sore Pain Intervention(s): Limited activity within patient's tolerance;Monitored during session;Ice applied;Premedicated before session    Home Living                      Prior Function            PT Goals (current goals can now be found in the care plan section) Acute Rehab PT Goals Patient Stated Goal: Rehab and then home alone    Frequency       PT Plan      Co-evaluation             End of Session           Time: [id number] PT Time Calculation (min) (ACUTE ONLY): 33 min  Charges:                       G Codes:      Vineta Carone 09/13/15, 11:51 AM

## 2015-08-24 NOTE — Clinical Social Work Note (Signed)
[facility] was submitted Wednesday at 1:53pm and has not yet been received.  It appears that [facility] was closed both yesterday and today for the Thanksgiving holiday.  [facility] cannot be obtained until Monday.  Nonnie Done, LCSW ([id number]) [id number]   Licensed Clinical Social Worker

## 2015-08-25 NOTE — Progress Notes (Signed)
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  Min cues for safety and use of AE.  Pt needs incidental assistance with RLE despite AE  Follow Up Recommendations  SNF    Equipment Recommendations  None recommended by OT    Recommendations for Other Services      Precautions / Restrictions Precautions Precautions: Fall Restrictions RLE Weight Bearing: Weight bearing as tolerated       Mobility Bed Mobility           Sit to supine: Min assist   General bed mobility comments: pt crossed RLE under L to assist but still needed min A for getting back to bed  Transfers     Transfers: Sit to/from Stand Sit to Stand: Supervision              Balance                                   ADL               Lower Body Bathing: Minimal assistance;Sit to/from stand;With adaptive equipment       Lower Body Dressing: Minimal assistance;Sit to/from stand;With adaptive equipment   Toilet Transfer: Supervision/safety;Ambulation;BSC             General ADL Comments: pt performed sit to stand with supervision from commode.  She stood at sink for UB adl--one cue for keeping walker in front of her.  Pt used AE with one cue. She can perform LB tasks but needs incidental help with R foot, to wash between toes and straighten sock completely      Vision                     Perception     Praxis      Cognition   Behavior During Therapy: [facility] for tasks assessed/performed Overall Cognitive Status: Within Functional Limits for tasks assessed                       Extremity/Trunk Assessment               Exercises     Shoulder Instructions       General Comments      Pertinent Vitals/ Pain       Pain Score: 6  Pain Location: R hip Pain  Descriptors / Indicators: Aching Pain Intervention(s): Limited activity within patient's tolerance;Monitored during session;Premedicated before session;Repositioned;Ice applied  Home Living                                          Prior Functioning/Environment              Frequency Min 2X/week     Progress Toward Goals  OT Goals(current goals can now be found in the care plan section)  Progress towards OT goals: Progressing toward goals     Plan      Co-evaluation                 End of Session     Activity Tolerance Patient tolerated treatment well  Patient Left in bed;with call bell/phone within reach   Nurse Communication          Time: [phone] OT Time Calculation (min): 24 min  Charges: OT General Charges $OT Visit: 1 Procedure OT Treatments $Self Care/Home Management : 23-37 mins  Ansel Ferrall 08/25/2015, 8:41 AM Lesle Chris, OTR/L [phone] 08/25/2015

## 2015-08-25 NOTE — Progress Notes (Signed)
Patient ID: Kristin Pope, female   DOB: April 03, 1956, 59 y.o.   MRN: [id number]    Subjective: 4 Days Post-Op Procedure(s) (LRB): RIGHT TOTAL HIP ARTHROPLASTY ANTERIOR APPROACH (Right) Patient reports pain as 3 on 0-10 scale.   Denies CP or SOB.  Voiding without difficulty. Positive flatus. Positive BM Objective: Vital signs in last 24 hours: Temp:  [97.5 F (36.4 C)-97.8 F (36.6 C)] 97.7 F (36.5 C) (11/26 0557) Pulse Rate:  [65-71] 68 (11/26 0557) Resp:  [13-16] 13 (11/26 0557) BP: (121-138)/(64-67) 138/65 mmHg (11/26 0557) SpO2:  [97 %-100 %] 100 % (11/26 0557)  Intake/Output from previous day: 11/25 0701 - 11/26 0700 In: 480 [P.O.:480] Out: -  Intake/Output this shift:    Labs:  Recent Labs  08/23/15 0430  HGB 9.5*    Recent Labs  08/23/15 0430  WBC 9.2  RBC 3.10*  HCT 30.1*  PLT 283    Recent Labs  08/23/15 0430  NA 135  K 4.2  CL 100*  CO2 29  BUN 10  CREATININE 0.75  GLUCOSE 108*  CALCIUM 8.7*   No results for input(s): LABPT, INR in the last 72 hours.  Physical Exam: Neurologically intact ABD soft Neurovascular intact Sensation intact distally Intact pulses distally Dorsiflexion/Plantar flexion intact Incision: no drainage Compartment soft  Assessment/Plan: 4 Days Post-Op Procedure(s) (LRB): RIGHT TOTAL HIP ARTHROPLASTY ANTERIOR APPROACH (Right) Advance diet Up with therapy Discharge to SNF  Because of the Holiday the pt will not be transported to teh SNF until Monday  Valinda Hoar for Dr. Melina Schools [facility] [phone] 08/25/2015, 7:29 AM

## 2015-08-25 NOTE — Progress Notes (Signed)
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  Agreeable to therex this am.  Follow Up Recommendations  SNF     Equipment Recommendations  None recommended by PT    Recommendations for Other Services OT consult     Precautions / Restrictions Precautions Precautions: Fall Restrictions Weight Bearing Restrictions: No RLE Weight Bearing: Weight bearing as tolerated    Mobility  Bed Mobility                  Transfers                    Ambulation/Gait                 Stairs            Wheelchair Mobility    Modified Rankin (Stroke Patients Only)       Balance                                    Cognition Arousal/Alertness: Awake/alert Behavior During Therapy: WFL for tasks assessed/performed Overall Cognitive Status: Within Functional Limits for tasks assessed                      Exercises Total Joint Exercises Ankle Circles/Pumps: AROM;Both;15 reps;Supine Gluteal Sets: AROM;Both;10 reps;Supine Short Arc Quad: AROM;Both;15 reps;Supine Heel Slides: AAROM;Right;Supine;20 reps Hip ABduction/ADduction: AAROM;Right;15 reps;Supine    General Comments        Pertinent Vitals/Pain Pain Assessment: 0-10 Pain Score: 4  Pain Location: R hip Pain Descriptors / Indicators: Aching;Sore Pain Intervention(s): Limited activity within patient's tolerance;Monitored during session;Premedicated before session;Ice applied    Home Living                      Prior Function            PT Goals (current goals can now be found in the care plan section) Acute Rehab PT Goals Patient Stated Goal: Rehab and then home alone PT Goal Formulation: With patient Time For Goal Achievement: 08/27/15 Potential to Achieve Goals:  Good Progress towards PT goals: Progressing toward goals    Frequency  7X/week    PT Plan Current plan remains appropriate    Co-evaluation             End of Session Equipment Utilized During Treatment: Gait belt Activity Tolerance: Patient tolerated treatment well Patient left: in bed;with call bell/phone within reach     Time: 1043-1102 PT Time Calculation (min) (ACUTE ONLY): 19 min  Charges:  $Therapeutic Exercise: 8-22 mins                    G Codes:      Kristin Pope Sep 13, 2015, 12:23 PM

## 2015-08-25 NOTE — Progress Notes (Signed)
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   Follow Up Recommendations  SNF     Equipment Recommendations  None recommended by PT    Recommendations for Other Services OT consult     Precautions / Restrictions Precautions Precautions: Fall Restrictions Weight Bearing Restrictions: No RLE Weight Bearing: Weight bearing as tolerated    Mobility  Bed Mobility Overal bed mobility: Needs Assistance Bed Mobility: Supine to Sit;Sit to Supine     Supine to sit: Min guard Sit to supine: Min assist   General bed mobility comments: pt crossed RLE under L to assist but still needed min A for getting back to bed  Transfers Overall transfer level: Needs assistance Equipment used: Rolling walker (2 wheeled) Transfers: Sit to/from Stand Sit to Stand: Supervision         General transfer comment: min cues for LE management and use of UEs to self assist  Ambulation/Gait Ambulation/Gait assistance: Min guard;Supervision Ambulation Distance (Feet): 222 Feet (twice) Assistive device: Rolling walker (2 wheeled) Gait Pattern/deviations: Step-through pattern;Decreased step length - right;Decreased step length - left;Shuffle;Trunk flexed     General Gait Details: min cues for posture and position from [employer]            Wheelchair Mobility    Modified Rankin (Stroke Patients Only)       Balance                                    Cognition Arousal/Alertness: Awake/alert Behavior During Therapy: WFL for tasks assessed/performed Overall Cognitive Status: Within Functional Limits for tasks assessed                      Exercises      General Comments        Pertinent Vitals/Pain Pain Assessment: 0-10 Pain Score: 4  Pain Location: R  hip Pain Descriptors / Indicators: Aching;Sore Pain Intervention(s): Limited activity within patient's tolerance;Monitored during session;Premedicated before session;Ice applied    Home Living                      Prior Function            PT Goals (current goals can now be found in the care plan section) Acute Rehab PT Goals Patient Stated Goal: Rehab and then home alone PT Goal Formulation: With patient Time For Goal Achievement: 08/27/15 Potential to Achieve Goals: Good Progress towards PT goals: Progressing toward goals    Frequency  7X/week    PT Plan Current plan remains appropriate    Co-evaluation             End of Session Equipment Utilized During Treatment: Gait belt Activity Tolerance: Patient tolerated treatment well Patient left: in bed;with call bell/phone within reach     Time: 1530-1555 PT Time Calculation (min) (ACUTE ONLY): 25 min  Charges:  $Gait Training: 23-37 mins                    G Codes:      Kristin Pope September 09, 2015, 4:42 PM

## 2015-08-26 NOTE — Progress Notes (Signed)
Subjective: 5 Days Post-Op Procedure(s) (LRB): RIGHT TOTAL HIP ARTHROPLASTY ANTERIOR APPROACH (Right) Patient reports pain as mild to right hip.  Progressing with PT. Tolerating PO's. [place]eneis CP, SOB, or calf pain.  Objective: Vital signs in last 24 hours: Temp:  [97.4 F (36.3 C)-98.1 F (36.7 C)] 97.7 F (36.5 C) (11/27 0600) Pulse Rate:  [63-69] 63 (11/27 0600) Resp:  [13-16] 16 (11/27 0600) BP: (128-144)/(65-77) 129/65 mmHg (11/27 0600) SpO2:  [96 %-100 %] 96 % (11/27 0600)  Intake/Output from previous day: 11/26 0701 - 11/27 0700 In: 480 [P.O.:480] Out: -  Intake/Output this shift:    No results for input(s): HGB in the last 72 hours. No results for input(s): WBC, RBC, HCT, PLT in the last 72 hours. No results for input(s): NA, K, CL, CO2, BUN, CREATININE, GLUCOSE, CALCIUM in the last 72 hours. No results for input(s): LABPT, INR in the last 72 hours.  Well nourished. Alert and oriented x3. RRR, Lungs clear, BS x4. Abdomen soft and non tender. Right Calf soft and non tender. Right hip dressing C/D/I. No DVT signs. Compartment soft. No signs of infection.  Right LE grossly neurovascular intact.  Assessment/Plan: 5 Days Post-Op Procedure(s) (LRB): RIGHT TOTAL HIP ARTHROPLASTY ANTERIOR APPROACH (Right) Up with PT DC/ to SNF Tomorrow, awaiting bed Continue current care  STILWELL, BRYSON L 08/26/2015, 8:44 AM

## 2015-08-26 NOTE — Progress Notes (Signed)
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  Follow Up Recommendations  SNF     Equipment Recommendations  None recommended by PT    Recommendations for Other Services OT consult     Precautions / Restrictions Precautions Precautions: Fall Restrictions Weight Bearing Restrictions: No RLE Weight Bearing: Weight bearing as tolerated    Mobility  Bed Mobility Overal bed mobility: Needs Assistance Bed Mobility: Supine to Sit;Sit to Supine     Supine to sit: Min guard Sit to supine: Min guard   General bed mobility comments: Pt self assisted L LE with R LE - min cues and used R side of bed  Transfers Overall transfer level: Needs assistance Equipment used: Rolling walker (2 wheeled) Transfers: Sit to/from Stand Sit to Stand: Supervision         General transfer comment: min cues for LE management and use of UEs to self assist  Ambulation/Gait Ambulation/Gait assistance: Min guard;Supervision Ambulation Distance (Feet): 400 Feet (and to/from bathroom) Assistive device: Rolling walker (2 wheeled) Gait Pattern/deviations: Step-through pattern;Decreased step length - right;Decreased step length - left;Shuffle;Trunk flexed     General Gait Details: min cues for posture and position from RW   Stairs Stairs: Yes Stairs assistance: Min assist Stair Management: One rail Right;Step to pattern;Forwards Number of Stairs: 5 General stair comments: min cues for sequence and foot placement  Wheelchair Mobility    Modified Rankin (Stroke Patients Only)       Balance                                    Cognition Arousal/Alertness: Awake/alert Behavior During Therapy: WFL for tasks assessed/performed Overall Cognitive Status: Within Functional Limits for tasks  assessed                      Exercises      General Comments        Pertinent Vitals/Pain Pain Assessment: 0-10 Pain Score: 4  Pain Location: R hip Pain Descriptors / Indicators: Aching;Sore Pain Intervention(s): Limited activity within patient's tolerance;Monitored during session;Premedicated before session;Ice applied    Home Living                      Prior Function            PT Goals (current goals can now be found in the care plan section) Acute Rehab PT Goals Patient Stated Goal: Rehab and then home alone PT Goal Formulation: With patient Time For Goal Achievement: 08/27/15 Potential to Achieve Goals: Good Progress towards PT goals: Progressing toward goals    Frequency  7X/week    PT Plan Current plan remains appropriate    Co-evaluation             End of Session Equipment Utilized During Treatment: Gait belt Activity Tolerance: Patient tolerated treatment well Patient left: in bed;with call bell/phone within reach     Time: 1105-1150 PT Time Calculation (min) (ACUTE ONLY): 45 min  Charges:  $Gait Training: 23-37 mins $Therapeutic Activity: 8-22 mins                    G Codes:      Kristin Pope 2015-09-04,  3:57 PM

## 2015-08-26 NOTE — Progress Notes (Signed)
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  Follow Up Recommendations  SNF     Equipment Recommendations  None recommended by PT    Recommendations for Other Services OT consult     Precautions / Restrictions Precautions Precautions: Fall Restrictions Weight Bearing Restrictions: No RLE Weight Bearing: Weight bearing as tolerated Other Position/Activity Restrictions: WBAT    Mobility  Bed Mobility Overal bed mobility: Needs Assistance Bed Mobility: Supine to Sit;Sit to Supine     Supine to sit: Supervision Sit to supine: Min guard   General bed mobility comments: Pt self assisted L LE with R LE - min cues and used R side of bed  Transfers Overall transfer level: Needs assistance Equipment used: Rolling walker (2 wheeled) Transfers: Sit to/from Stand Sit to Stand: Supervision         General transfer comment: min cues for use of UEs to self assist  Ambulation/Gait Ambulation/Gait assistance: Supervision Ambulation Distance (Feet): 300 Feet (and to/from bathroom) Assistive device: Rolling walker (2 wheeled) Gait Pattern/deviations: Step-through pattern;Shuffle;Trunk flexed     General Gait Details: min cues for posture and position from RW   Stairs Stairs: Yes Stairs assistance: Min guard Stair Management: One rail Right;Step to pattern;Forwards Number of Stairs: 4 General stair comments: min cues for sequence and foot placement  Wheelchair Mobility    Modified Rankin (Stroke Patients Only)       Balance                                    Cognition Arousal/Alertness: Awake/alert Behavior During Therapy: WFL for tasks assessed/performed Overall Cognitive Status: Within Functional Limits for tasks  assessed                      Exercises      General Comments        Pertinent Vitals/Pain Pain Assessment: 0-10 Pain Score: 3  Pain Location: R hip Pain Descriptors / Indicators: Aching;Sore Pain Intervention(s): Monitored during session;Limited activity within patient's tolerance;Premedicated before session    Home Living                      Prior Function            PT Goals (current goals can now be found in the care plan section) Acute Rehab PT Goals Patient Stated Goal: Rehab and then home alone PT Goal Formulation: With patient Time For Goal Achievement: 08/27/15 Potential to Achieve Goals: Good Progress towards PT goals: Progressing toward goals    Frequency  7X/week    PT Plan Current plan remains appropriate    Co-evaluation             End of Session   Activity Tolerance: Patient tolerated treatment well Patient left: in bed;with call bell/phone within reach     Time: 1524-1550 PT Time Calculation (min) (ACUTE ONLY): 26 min  Charges:  $Gait Training: 23-37 mins                    G Codes:      Kristin Pope 09-17-2015, 4:02 PM

## 2015-08-26 NOTE — Care Management Note (Signed)
Case Management Note  Patient Details  Name: Kristin Pope MRN: [id number] Date of Birth: August 21, 1956  Subjective/Objective:  RIGHT TOTAL HIP ARTHROPLASTY                 Action/Plan: Scheduled dc to SNF. CSW following for SNF placement.   Expected Discharge Date:  08/27/2015              Expected Discharge Plan:  [place]  In-House Referral:  Clinical Social Work  Discharge planning Services  CM Consult   Status of Service:  Completed, signed off  Medicare Important Message Given:    Date Medicare IM Given:    Medicare IM give by:    Date Additional Medicare IM Given:    Additional Medicare Important Message give by:     If discussed at [place] of Stay Meetings, dates discussed:    Additional Comments:  Erenest Rasher, RN 08/26/2015, 4:02 PM

## 2015-08-27 NOTE — Progress Notes (Signed)
CSW sent Pt information to [facility] for possible placement.    CSW awaiting call back for possible placement.   Pt to d/c today.    [place]  [phone]

## 2015-08-27 NOTE — Progress Notes (Signed)
Physical Therapy Treatment Patient Details Name: Kristin Pope MRN: [id number] DOB: [birth year] Today's Date: 08/27/2015    History of Present Illness L THR; s/p R THR 10/16    PT Comments    Pt progresses towards physical therapy goals. Performed ambulation >250 feet with no increase in pain, performed exercises, provided handout for the pt. Educated on using a sheet for self-assist with R LE for bed mobility. Discussed d/c planning and answered all pt's questions. Changed pt's d/c destination to home with HHPT d/t to pt's high level functional mobility.     Follow Up Recommendations  Home health PT;Supervision - Intermittent     Equipment Recommendations  None recommended by PT    Recommendations for Other Services       Precautions / Restrictions Precautions Precautions: Fall Restrictions Weight Bearing Restrictions: No RLE Weight Bearing: Weight bearing as tolerated    Mobility  Bed Mobility Overal bed mobility: Needs Assistance Bed Mobility: Supine to Sit;Sit to Supine     Supine to sit: Supervision Sit to supine: Min assist   General bed mobility comments: supine to sit pt self-assisted with R LE, sit to supine pt requested assistance from therapist to bring R LE up in bed   Transfers Overall transfer level: Needs assistance Equipment used: Rolling walker (2 wheeled) Transfers: Sit to/from Stand Sit to Stand: Supervision         General transfer comment: min VC's for use of UEs for correct technique  Ambulation/Gait Ambulation/Gait assistance: Supervision Ambulation Distance (Feet): 275 Feet Assistive device: Rolling walker (2 wheeled) Gait Pattern/deviations: Step-through pattern;Antalgic     General Gait Details: min VC's for breathing, good posture, and RW progression   Stairs Stairs:  (pt practiced and is comfortable navigating stairs at home)          Wheelchair Mobility    Modified Rankin (Stroke Patients Only)       Balance                                     Cognition Arousal/Alertness: Awake/alert Behavior During Therapy: WFL for tasks assessed/performed Overall Cognitive Status: Within Functional Limits for tasks assessed                      Exercises Total Joint Exercises Ankle Circles/Pumps: AROM;Both;20 reps;Supine Quad Sets: Strengthening;Both;10 reps;Supine Short Arc Quad: AROM;Both;10 reps;Supine Heel Slides: AROM;Both;5 reps Hip ABduction/ADduction: AROM;Strengthening;Both;10 reps;Supine Straight Leg Raises: AROM;AAROM;Both;10 reps    General Comments        Pertinent Vitals/Pain Pain Assessment: 0-10 Pain Score: 6  Pain Location: R hip during amb Pain Descriptors / Indicators: Aching Pain Intervention(s): Limited activity within patient's tolerance;Monitored during session;Premedicated before session;Repositioned;Ice applied    Home Living                      Prior Function            PT Goals (current goals can now be found in the care plan section) Acute Rehab PT Goals Patient Stated Goal: Rehab and then home alone PT Goal Formulation: With patient Time For Goal Achievement: 08/27/15 Potential to Achieve Goals: Good Progress towards PT goals: Progressing toward goals    Frequency  7X/week    PT Plan Discharge plan needs to be updated    Co-evaluation             End of  Session Equipment Utilized During Treatment: Gait belt Activity Tolerance: Patient tolerated treatment well Patient left: in bed;with call bell/phone within reach     Time: 1117-1145 PT Time Calculation (min) (ACUTE ONLY): 28 min  Charges:  $Gait Training: 8-22 mins $Therapeutic Exercise: 8-22 mins                    G Codes:      Ossiel Marchio, SPT 09-25-2015, 2:10 PM

## 2015-08-27 NOTE — Progress Notes (Signed)
CSW spoke with Kristin Pope concerning [facility]. Pt does not meet criteria for SNF placement based on current level of ambulation with PT.   CSW notified CM and Pt will d/c with [facility] services.     [place]  [phone]

## 2015-08-27 NOTE — Progress Notes (Signed)
Patient ID: Kristin Pope, female   DOB: May 28, 1956, 59 y.o.   MRN: [id number] Subjective: 6 Days Post-Op Procedure(s) (LRB): RIGHT TOTAL HIP ARTHROPLASTY ANTERIOR APPROACH (Right)    Patient reports pain as mild.  Making progress.  Limited by living arrangements and confidence with gait  Objective:   VITALS:   Filed Vitals:   08/26/15 2032 08/27/15 0522  BP: 125/70 143/86  Pulse: 63 63  Temp: 98.2 F (36.8 C) 97.6 F (36.4 C)  Resp: 15 15    Neurovascular intact Incision: dressing C/D/I  LABS No results for input(s): HGB, HCT, WBC, PLT in the last 72 hours.  No results for input(s): NA, K, BUN, CREATININE, GLUCOSE in the last 72 hours.  No results for input(s): LABPT, INR in the last 72 hours.   Assessment/Plan: 6 Days Post-Op Procedure(s) (LRB): RIGHT TOTAL HIP ARTHROPLASTY ANTERIOR APPROACH (Right)   Discharge to SNF when able as this is patients desire based on recovery from her other hip Up with therapy Awaiting input from SW

## 2015-08-27 NOTE — Care Management Note (Signed)
Case Management Note  Patient Details  Name: Kristin Pope MRN: [id number] Date of Birth: 1956/06/14  Subjective/Objective:     s/p R THR 10/16               Action/Plan: Discharge planning, received a call from [place] that patient no longer qualifies for SNF. Spoke with patient at bedside, discussed d/c options. Patient plans to go home with assistance from mother and fiance'. Has chosen [facility] for [facility] services. Has all needed DME. Planning for d/c home today.   Expected Discharge Date:                  Expected Discharge Plan:  [place]  In-House Referral:  Clinical Social Work  Discharge planning Services  CM Consult  Post Acute Care Choice:  Home Health Choice offered to:  Patient  DME Arranged:  N/A DME Agency:  NA  HH Arranged:  PT HH Agency:  [facility]  Status of Service:  Completed, signed off  Medicare Important Message Given:    Date Medicare IM Given:    Medicare IM give by:    Date Additional Medicare IM Given:    Additional Medicare Important Message give by:     If discussed at [place] of Stay Meetings, dates discussed:    Additional Comments:  Guadalupe Maple, RN 08/27/2015, 12:01 PM

## 2015-08-27 NOTE — Progress Notes (Signed)
CSW spoke with the Pt and she does not want to accept the two bed offers. CSW contacted [facility] and [facility].   Pt would rather be in [facility] however the are no offers at this time.   CSW received a call back from [facility] concerning  Pt placement.  Facility will look at Pt information and get back to CSW.   CSW received a call back from [place] at [facility] and they will not accept Pt.  Pt notified.    [place] [phone]

## 2015-09-01 ENCOUNTER — Other Ambulatory Visit: Payer: Self-pay | Admitting: Internal Medicine

## 2015-09-11 ENCOUNTER — Ambulatory Visit: Payer: Medicaid Other | Attending: Orthopedic Surgery

## 2015-09-11 DIAGNOSIS — R269 Unspecified abnormalities of gait and mobility: Secondary | ICD-10-CM | POA: Insufficient documentation

## 2015-09-11 DIAGNOSIS — Z96643 Presence of artificial hip joint, bilateral: Secondary | ICD-10-CM | POA: Insufficient documentation

## 2015-09-11 DIAGNOSIS — Z471 Aftercare following joint replacement surgery: Secondary | ICD-10-CM

## 2015-09-11 DIAGNOSIS — R29898 Other symptoms and signs involving the musculoskeletal system: Secondary | ICD-10-CM

## 2015-09-11 DIAGNOSIS — M6289 Other specified disorders of muscle: Secondary | ICD-10-CM | POA: Diagnosis present

## 2015-09-11 NOTE — Therapy (Signed)
[facility] [address], [place], [place], [address] Phone: [phone]   Fax:  [phone]  Physical Therapy Evaluation  Patient Details  Name: Kristin Pope MRN: [id number] Date of Birth: Feb 15, 1956 Referring Provider: Paralee Cancel, MD  Encounter Date: 09/11/2015      PT End of Session - 09/11/15 1531    Visit Number 1   Date for PT Re-Evaluation 11/06/15   PT Start Time [id number]   PT Stop Time 1527   PT Time Calculation (min) 38 min   Activity Tolerance Patient tolerated treatment well  No treatment today due to [facility] insurance, will submit for visits   Behavior During Therapy [facility] for tasks assessed/performed      Past Medical History  Diagnosis Date  . Allergy   . Depression   . Fibromyalgia   . Hypertension   . Hyperlipidemia   . Low back pain   . Vaginitis     atropic-ongoing abnormal vaginal bleeding-had ultrasound and biopsy in last couple months  . Chronic fatigue   . Headache     constant headaches  . Arthritis     neck and spine, with bone spurs  . Torn rotator cuff     right shoulder  . Family history of adverse reaction to anesthesia     sister has problems waking up  . Reactive airway disease   . Diverticulitis 2011    HAD BOWEL PERFORATION  . Sleep apnea     does not use sleep - was told did not need cpap  . Abnormal brain MRI     per pt   . Hx: UTI (urinary tract infection)   . Anemia, pernicious   . Borderline diabetes     no meds  . Anxiety   . DDD (degenerative disc disease), cervical     ALSO LUMBAR AREA  . Hx of pyelonephritis 2007  . MVP (mitral valve prolapse)     Past Surgical History  Procedure Laterality Date  . Hysteroscopy  01/2011  . Radial optic neurotomy      twice in lumbar area of back-every 6 months  . Colonoscopy w/ polypectomy    . Breast surgery      breast biopsy-benign  . Dilation and curettage of uterus    . Total hip arthroplasty Left  07/17/2015    Procedure: LEFT TOTAL HIP ARTHROPLASTY ANTERIOR APPROACH;  Surgeon: Paralee Cancel, MD;  Location: [facility];  Service: Orthopedics;  Laterality: Left;  . Total hip arthroplasty Right 08/21/2015    Procedure: RIGHT TOTAL HIP ARTHROPLASTY ANTERIOR APPROACH;  Surgeon: Paralee Cancel, MD;  Location: [facility];  Service: Orthopedics;  Laterality: Right;    There were no vitals filed for this visit.  Visit Diagnosis:  Aftercare following bilateral hip joint replacement surgery - Plan: PT plan of care cert/re-cert  Weakness of both hips - Plan: PT plan of care cert/re-cert  Abnormality of gait - Plan: PT plan of care cert/re-cert      Subjective Assessment - 09/11/15 1504    Subjective Pt is a 59 y.o. 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   Pt had a stay in a SNF for rehab s/p Lt hip replacement.  No home health PT.  Pt is using walker when outside of the house.     Pertinent History chronic LBP with nerve ablasion. Lt hip replacement 07/17/15, Rt  hip replacment 08/21/15   Limitations Standing;Walking;Sitting   How long can you stand comfortably? 20 minutes   How long can you walk comfortably? < 5 minutes   Patient Stated Goals improve gait, hip strength and endurance   Currently in Pain? Yes   Pain Score 3   after movement 7-8/10   Pain Location Leg   Pain Orientation Right;Left   Pain Descriptors / Indicators Sore;Aching   Pain Type Surgical pain   Pain Onset 1 to 4 weeks ago   Pain Frequency Constant   Aggravating Factors  movement, walking, standing, getting into a vehicle   Pain Relieving Factors rest, Tylenol            [facility] PT Assessment - 09/11/15 0001    Assessment   Medical Diagnosis s/p bilateral total hip arhtroplasties   Referring Provider Paralee Cancel, MD   Onset Date/Surgical Date 08/21/15  Lt 07/17/15   Next MD Visit 10/02/14   Precautions   Precautions  None   Restrictions   Weight Bearing Restrictions No   Balance Screen   Has the patient fallen in the past 6 months Yes   How many times? 2  outside in the dark, prior to surgery   Has the patient had a decrease in activity level because of a fear of falling?  Yes   Is the patient reluctant to leave their home because of a fear of falling?  Yes   [place] Private residence   Living Arrangements Spouse/significant other   Type of [place] to enter   Entrance Stairs-Number of Steps [place] One level   Prior Function   Level of Independence Independent   Vocation On disability   Cognition   Overall Cognitive Status Within Functional Limits for tasks assessed   Observation/Other Assessments   Focus on Therapeutic Outcomes (FOTO)  73% limitation   ROM / Strength   AROM / PROM / Strength AROM;PROM;Strength   AROM   Overall AROM  Deficits   Overall AROM Comments bil hip flexibility limited by 25% limited   PROM   Overall PROM  Within functional limits for tasks performed   Overall PROM Comments bilateral hip flexibility is full with stiffness at end range.   Strength   Overall Strength Deficits   Strength Assessment Site Knee;Hip   Right/Left Hip Right;Left   Right Hip Flexion 2+/5   Right Hip Extension 4/5   Right Hip ABduction 3/5   Left Hip Flexion 4+/5   Left Hip Extension 4+/5   Left Hip ABduction 4+/5   Right/Left Knee Right;Left   Right Knee Flexion 4+/5   Right Knee Extension 4+/5   Left Knee Flexion 4+/5   Left Knee Extension 4+/5   Palpation   Palpation comment diffuse palpable tenderenss over Rt and Lt anterior groin and thigh musculature   Transfers   Transfers Sit to Stand;Stand to Sit   Sit to Stand 6: Modified independent (Device/Increase time);With upper extremity assist;With armrests   Ambulation/Gait   Ambulation/Gait Yes   Ambulation/Gait Assistance 6: Modified independent (Device/Increase  time)   Ambulation Distance (Feet) 75 Feet   Assistive device Rolling walker   Gait Pattern Step-through pattern;Decreased stride length   Stairs Yes   Stairs Assistance 6: Modified independent (Device/Increase time)   Stair Management Technique One rail Right;Step to pattern  leading with Lt    Number of Stairs 4   Height of Stairs  6                             PT Short Term Goals - 09/11/15 1541    PT SHORT TERM GOAL #1   Title be independent in initial HEP   Time 4   Period Weeks   Status New   PT SHORT TERM GOAL #2   Title demonstrate 4/5 Rt hip strength to improve endurance   Time 4   Period Weeks   Status New   PT SHORT TERM GOAL #3   Title improve hip strength to ascend steps with step-over-step with use of 1 rail   Time 4   Period Weeks   Status New   PT SHORT TERM GOAL #4   Title walk for 15 minutes without rest   Time 4   Period Weeks   Status New           PT Long Term Goals - 09/11/15 1458    PT LONG TERM GOAL #1   Title be independent in advanced HEP   Baseline no HEP   Time 8   Period Weeks   Status New   PT LONG TERM GOAL #2   Title reduce FOTO to < or = to 62% limitation   Baseline 73%   Time 8   Period Weeks   Status New   PT LONG TERM GOAL #3   Title demonstrate 4+/5 Rt hip strength to improve endurance for standing and walking   Time 8   Period Weeks   Status New   PT LONG TERM GOAL #4   Title wean from walker for all distances   Time 8   Period Weeks   Status New   PT LONG TERM GOAL #5   Title walk for 20 minutes in the community without need to rest   Time 8   Period Weeks   Status New   Additional Long Term Goals   Additional Long Term Goals Yes   PT LONG TERM GOAL #6   Title report < or = to 4/10 hip pain with standing for house work   Time 8   Period Weeks   Status New               Plan - 09/11/15 1532    Clinical Impression Statement Pt is a 59 y.o. female who presents to PT s/p Rt THA  08/21/15 and Lt THA 07/17/15 secondary to OA.  Pt also has chronic LBP.  FOTO score is 73% limitation.  Rt hip flexion is 2+/5, abduction 3/5, extension 4/5.  Lt hip is 4+/5 throughout.  Bilateral knees 4+/5.  Pt with reduced endurance for gait, need for walker for stability and is limited to stanidng 20 minutes for housework and 5 minutes for walking.  Pt will benefit from skilled PT for hip strength progression, core strength to improve endurance and gait to allow for improved independence and endurance at home and in the community.     Pt will benefit from skilled therapeutic intervention in order to improve on the following deficits Abnormal gait;Decreased range of motion;Difficulty walking;Pain;Decreased strength;Decreased mobility;Decreased balance;Decreased activity tolerance   Rehab Potential Good   PT Frequency 2x / week   PT Duration 8 weeks   PT Treatment/Interventions ADLs/Self Care Home Management;Cryotherapy;Electrical Stimulation;Moist Heat;Therapeutic exercise;Therapeutic activities;Functional mobility training;Stair training;Gait training;Ultrasound;Patient/family education;Manual techniques;Passive range of motion;Neuromuscular re-education;Balance training   PT Next Visit Plan Bil hip strength,  core strength exercises for HEP, gait, steps, pain management as needed.     Consulted and Agree with Plan of Care Patient         Problem List Patient Active Problem List   Diagnosis Date Noted  . GERD (gastroesophageal reflux disease) 07/23/2015  . Chronic pain 07/23/2015  . Obese 07/19/2015  . S/P left THA, AA 07/17/2015  . 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 Insomnia 03/03/2011  . OSA (obstructive sleep apnea) 03/03/2011  . Depression, major, recurrent ([place]) 12/02/2010  . CONSTIPATION, SLOW TRANSIT 10/04/2010  . DIVERTICULITIS, COLON, WITH PERFORATION 08/27/2010  . ABDOMINAL PAIN, LEFT LOWER QUADRANT 08/16/2010  . BACTERIAL VAGINITIS 05/28/2010  . IRRITABLE BOWEL  SYNDROME 08/14/2009  . CYSTITIS, CHRONIC INTERSTITIAL 06/27/2009  . UNSPECIFIED HYPOTHYROIDISM 05/16/2009  . LIPOMA OF OTHER SPECIFIED SITES 04/13/2009  . BRUXISM 04/13/2009  . SYNCOPE 03/13/2009  . MRI, BRAIN, ABNORMAL 02/07/2009  . MUSCLE WEAKNESS (GENERALIZED) 12/20/2008  . CERUMEN IMPACTION, BILATERAL 09/18/2008  . CANDIDIASIS OF UNSPECIFIED SITE 08/30/2008  . UNSPECIFIED ALLERGIC ALVEOLITIS AND PNEUMONITIS 08/02/2008  . HIP PAIN, LEFT, CHRONIC 07/04/2008  . MALAISE AND FATIGUE 07/04/2008  . LOW BACK PAIN 05/26/2008  . CHEST PAIN, ATYPICAL 03/02/2008  . EXTRINSIC ASTHMA, WITH EXACERBATION 02/03/2008  . DIABETES MELLITUS, TYPE II, UNCONTROLLED 01/06/2008  . PROTEINURIA 12/23/2007  . HYPERLIPIDEMIA 11/18/2007  . HYPERCALCEMIA 11/18/2007  . Essential hypertension 09/14/2007  . ANEMIA, B12 DEFICIENCY 05/18/2007  . 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 ALLERGIC RHINITIS 03/29/2007  . FIBROMYALGIA 03/29/2007    TAKACS,KELLY, PT 09/11/2015, 3:46 PM  [facility] [address], [place], [place], [address] Phone: [phone]   Fax:  [phone]  Name: Kristin Pope MRN: [id number] Date of Birth: Aug 24, 1956

## 2015-09-18 ENCOUNTER — Ambulatory Visit: Payer: Medicaid Other

## 2015-09-18 DIAGNOSIS — R29898 Other symptoms and signs involving the musculoskeletal system: Secondary | ICD-10-CM

## 2015-09-18 DIAGNOSIS — Z471 Aftercare following joint replacement surgery: Secondary | ICD-10-CM | POA: Diagnosis not present

## 2015-09-18 DIAGNOSIS — Z96643 Presence of artificial hip joint, bilateral: Principal | ICD-10-CM

## 2015-09-18 DIAGNOSIS — R269 Unspecified abnormalities of gait and mobility: Secondary | ICD-10-CM

## 2015-09-18 NOTE — Patient Instructions (Signed)
KNEE: Extension, Long Arc Quad (Weight)  Place weight around leg. Raise leg until knee is straight. Hold _5__ seconds. Use ___ lb weight. _10__ reps per set (each leg), 4-5__ sets per day, __7_ days per week  Knee Raise   Lift knee and then lower it. Repeat with other knee. Repeat _10__ times each leg. Do _4-5___ sessions per day.  http://gt2.exer.us/445   Copyright  VHI. All rights reserved.  Toe Up   Gently rise up on toes and back on heels. Repeat _20___ times. Do 4-5____ sessions per day.  Knee High   Holding stable object, raise knee to hip level, then lower knee. Repeat with other knee. Complete __10_ repetitions. Do __2__ sessions per day.  ABDUCTION: Standing (Active)   Stand, feet flat. Lift right leg out to side. Use _0__ lbs. Complete __10_ repetitions. Perform __2_ sessions per day.   EXTENSION: Standing (Active)  Stand, both feet flat. Draw right leg behind body as far as possible. Use 0___ lbs. Complete 10 repetitions. Perform __2_ sessions per day.  Copyright  VHI. All rights reserved.     [facility] [address], [place], [place] [address] Phone # [phone] Fax [phone]

## 2015-09-18 NOTE — Therapy (Signed)
[facility] [address] [place], [place], [address] Phone: [phone]   Fax:  [phone]  Physical Therapy Treatment  Patient Details  Name: Kristin Pope MRN: [id number] Date of Birth: [birth year] Referring Provider: Paralee Cancel, MD  Encounter Date: 09/18/2015      PT End of Session - 09/18/15 1312    Visit Number 2   Date for PT Re-Evaluation 11/06/15   Authorization Type Medicaid   Authorization Time Period 16 visits 09/14/15-11/08/15   Authorization - Visit Number 1   Authorization - Number of Visits 16   PT Start Time 9735   PT Stop Time 1316   PT Time Calculation (min) 30 min   Activity Tolerance Patient tolerated treatment well  pt arrived 15 minutes late   Behavior During Therapy [facility] for tasks assessed/performed      Past Medical History  Diagnosis Date  . Allergy   . Depression   . Fibromyalgia   . Hypertension   . Hyperlipidemia   . Low back pain   . Vaginitis     atropic-ongoing abnormal vaginal bleeding-had ultrasound and biopsy in last couple months  . Chronic fatigue   . Headache     constant headaches  . Arthritis     neck and spine, with bone spurs  . Torn rotator cuff     right shoulder  . Family history of adverse reaction to anesthesia     sister has problems waking up  . Reactive airway disease   . Diverticulitis 2011    HAD BOWEL PERFORATION  . Sleep apnea     does not use sleep - was told did not need cpap  . Abnormal brain MRI     per pt   . Hx: UTI (urinary tract infection)   . Anemia, pernicious   . Borderline diabetes     no meds  . Anxiety   . DDD (degenerative disc disease), cervical     ALSO LUMBAR AREA  . Hx of pyelonephritis 2007  . MVP (mitral valve prolapse)     Past Surgical History  Procedure Laterality Date  . Hysteroscopy  01/2011  . Radial optic neurotomy      twice in lumbar area of back-every 6 months  . Colonoscopy w/ polypectomy    .  Breast surgery      breast biopsy-benign  . Dilation and curettage of uterus    . Total hip arthroplasty Left 07/17/2015    Procedure: LEFT TOTAL HIP ARTHROPLASTY ANTERIOR APPROACH;  Surgeon: Paralee Cancel, MD;  Location: [facility];  Service: Orthopedics;  Laterality: Left;  . Total hip arthroplasty Right 08/21/2015    Procedure: RIGHT TOTAL HIP ARTHROPLASTY ANTERIOR APPROACH;  Surgeon: Paralee Cancel, MD;  Location: [facility];  Service: Orthopedics;  Laterality: Right;    There were no vitals filed for this visit.  Visit Diagnosis:  Aftercare following bilateral hip joint replacement surgery  Weakness of both hips  Abnormality of gait                       [facility] Adult PT Treatment/Exercise - 09/18/15 0001    Exercises   Exercises Lumbar;Knee/Hip;Ankle   Knee/Hip Exercises: Aerobic   Nustep Level 2x 5 minutes   Knee/Hip Exercises: Standing   Hip Flexion Stengthening;Both;2 sets;10 reps;Knee straight   Hip Abduction Stengthening;Both;2 sets;10 reps   Hip Extension Both;Stengthening;2 sets;10 reps   Knee/Hip Exercises: Seated   Long Arc [employer]  sets;10 reps   Marching Both;2 sets;10 reps   Ankle Exercises: Seated   Heel Raises 20 reps   Toe Raise 20 reps                PT Education - 09/18/15 1309    Education provided Yes   Education Details HEP: seated strength for legs, hip strength in standing with abdominal bracing   Person(s) Educated Patient   Methods Explanation;Demonstration   Comprehension Verbalized understanding;Returned demonstration          PT Short Term Goals - 09/18/15 1248    PT SHORT TERM GOAL #1   Title be independent in initial HEP   Time 4   Period Weeks   Status On-going   PT SHORT TERM GOAL #2   Title demonstrate 4/5 Rt hip strength to improve endurance   Time 4   Period Weeks   Status On-going   PT SHORT TERM GOAL #3   Title improve hip strength to ascend steps with step-over-step with use of 1 rail   Time 4    Period Weeks   Status On-going           PT Long Term Goals - 09/11/15 1458    PT LONG TERM GOAL #1   Title be independent in advanced HEP   Baseline no HEP   Time 8   Period Weeks   Status New   PT LONG TERM GOAL #2   Title reduce FOTO to < or = to 62% limitation   Baseline 73%   Time 8   Period Weeks   Status New   PT LONG TERM GOAL #3   Title demonstrate 4+/5 Rt hip strength to improve endurance for standing and walking   Time 8   Period Weeks   Status New   PT LONG TERM GOAL #4   Title wean from walker for all distances   Time 8   Period Weeks   Status New   PT LONG TERM GOAL #5   Title walk for 20 minutes in the community without need to rest   Time 8   Period Weeks   Status New   Additional Long Term Goals   Additional Long Term Goals Yes   PT LONG TERM GOAL #6   Title report < or = to 4/10 hip pain with standing for house work   Time 8   Period Weeks   Status New               Plan - 09/18/15 1248    Clinical Impression Statement Pt is s/p Rt and Lt hip replacement surgery.  Pt with significant hip weakness, gait abnorality and limited standing and walking endurance.  Pt with only 1 session after evaluation and HEP initiated today.  No goals yet met due to limited attendance so far.  Pt will benefit from skilled PT for hip strength, endurance, flexibility and gait training.     Pt will benefit from skilled therapeutic intervention in order to improve on the following deficits Abnormal gait;Decreased range of motion;Difficulty walking;Pain;Decreased strength;Decreased mobility;Decreased balance;Decreased activity tolerance   Rehab Potential Good   PT Frequency 2x / week   PT Duration 8 weeks   PT Treatment/Interventions ADLs/Self Care Home Management;Cryotherapy;Electrical Stimulation;Moist Heat;Therapeutic exercise;Therapeutic activities;Functional mobility training;Stair training;Gait training;Ultrasound;Patient/family education;Manual  techniques;Passive range of motion;Neuromuscular re-education;Balance training   PT Next Visit Plan Bil hip strength, core strength exercises for HEP, gait, steps, pain management as needed.  Add core  strength to HEP   Consulted and Agree with Plan of Care Patient        Problem List Patient Active Problem List   Diagnosis Date Noted  . GERD (gastroesophageal reflux disease) 07/23/2015  . Chronic pain 07/23/2015  . Obese 07/19/2015  . S/P left THA, AA 07/17/2015  . 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 Insomnia 03/03/2011  . OSA (obstructive sleep apnea) 03/03/2011  . Depression, major, recurrent ([place]) 12/02/2010  . CONSTIPATION, SLOW TRANSIT 10/04/2010  . DIVERTICULITIS, COLON, WITH PERFORATION 08/27/2010  . ABDOMINAL PAIN, LEFT LOWER QUADRANT 08/16/2010  . BACTERIAL VAGINITIS 05/28/2010  . IRRITABLE BOWEL SYNDROME 08/14/2009  . CYSTITIS, CHRONIC INTERSTITIAL 06/27/2009  . UNSPECIFIED HYPOTHYROIDISM 05/16/2009  . LIPOMA OF OTHER SPECIFIED SITES 04/13/2009  . BRUXISM 04/13/2009  . SYNCOPE 03/13/2009  . MRI, BRAIN, ABNORMAL 02/07/2009  . MUSCLE WEAKNESS (GENERALIZED) 12/20/2008  . CERUMEN IMPACTION, BILATERAL 09/18/2008  . CANDIDIASIS OF UNSPECIFIED SITE 08/30/2008  . UNSPECIFIED ALLERGIC ALVEOLITIS AND PNEUMONITIS 08/02/2008  . HIP PAIN, LEFT, CHRONIC 07/04/2008  . MALAISE AND FATIGUE 07/04/2008  . LOW BACK PAIN 05/26/2008  . CHEST PAIN, ATYPICAL 03/02/2008  . EXTRINSIC ASTHMA, WITH EXACERBATION 02/03/2008  . DIABETES MELLITUS, TYPE II, UNCONTROLLED 01/06/2008  . PROTEINURIA 12/23/2007  . HYPERLIPIDEMIA 11/18/2007  . HYPERCALCEMIA 11/18/2007  . Essential hypertension 09/14/2007  . ANEMIA, B12 DEFICIENCY 05/18/2007  . 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 ALLERGIC RHINITIS 03/29/2007  . FIBROMYALGIA 03/29/2007    TAKACS,KELLY, PT 09/18/2015, 1:19 PM  [facility] [address], [place], [place], [address] Phone: [phone]   Fax:  [phone]  Name: ZARRA GEFFERT MRN: [id number] Date of Birth: Oct 29, 1955

## 2015-09-21 ENCOUNTER — Ambulatory Visit: Payer: Medicaid Other | Admitting: Physical Therapy

## 2015-09-21 ENCOUNTER — Encounter: Payer: Self-pay | Admitting: Physical Therapy

## 2015-09-21 DIAGNOSIS — Z471 Aftercare following joint replacement surgery: Secondary | ICD-10-CM | POA: Diagnosis not present

## 2015-09-21 DIAGNOSIS — R29898 Other symptoms and signs involving the musculoskeletal system: Secondary | ICD-10-CM

## 2015-09-21 DIAGNOSIS — R269 Unspecified abnormalities of gait and mobility: Secondary | ICD-10-CM

## 2015-09-21 DIAGNOSIS — Z96643 Presence of artificial hip joint, bilateral: Principal | ICD-10-CM

## 2015-09-21 NOTE — Therapy (Signed)
[facility] [address] [place], [place], [address] Phone: [phone]   Fax:  [phone]  Physical Therapy Treatment  Patient Details  Name: Kristin Pope MRN: [id number] Date of Birth: 1955-10-06 Referring Provider: Paralee Cancel, MD  Encounter Date: 09/21/2015      PT End of Session - 09/21/15 1133    Visit Number 3   Date for PT Re-Evaluation 11/06/15   Authorization Type Medicaid   Authorization Time Period 16 visits 09/14/15-11/08/15   Authorization - Visit Number 2   Authorization - Number of Visits 16   PT Start Time 1109   PT Stop Time 1149   PT Time Calculation (min) 40 min   Activity Tolerance Patient tolerated treatment well   Behavior During Therapy [facility] for tasks assessed/performed      Past Medical History  Diagnosis Date  . Allergy   . Depression   . Fibromyalgia   . Hypertension   . Hyperlipidemia   . Low back pain   . Vaginitis     atropic-ongoing abnormal vaginal bleeding-had ultrasound and biopsy in last couple months  . Chronic fatigue   . Headache     constant headaches  . Arthritis     neck and spine, with bone spurs  . Torn rotator cuff     right shoulder  . Family history of adverse reaction to anesthesia     sister has problems waking up  . Reactive airway disease   . Diverticulitis 2011    HAD BOWEL PERFORATION  . Sleep apnea     does not use sleep - was told did not need cpap  . Abnormal brain MRI     per pt   . Hx: UTI (urinary tract infection)   . Anemia, pernicious   . Borderline diabetes     no meds  . Anxiety   . DDD (degenerative disc disease), cervical     ALSO LUMBAR AREA  . Hx of pyelonephritis 2007  . MVP (mitral valve prolapse)     Past Surgical History  Procedure Laterality Date  . Hysteroscopy  01/2011  . Radial optic neurotomy      twice in lumbar area of back-every 6 months  . Colonoscopy w/ polypectomy    . Breast surgery      breast  biopsy-benign  . Dilation and curettage of uterus    . Total hip arthroplasty Left 07/17/2015    Procedure: LEFT TOTAL HIP ARTHROPLASTY ANTERIOR APPROACH;  Surgeon: Paralee Cancel, MD;  Location: [facility];  Service: Orthopedics;  Laterality: Left;  . Total hip arthroplasty Right 08/21/2015    Procedure: RIGHT TOTAL HIP ARTHROPLASTY ANTERIOR APPROACH;  Surgeon: Paralee Cancel, MD;  Location: [facility];  Service: Orthopedics;  Laterality: Right;    There were no vitals filed for this visit.  Visit Diagnosis:  Aftercare following bilateral hip joint replacement surgery  Weakness of both hips  Abnormality of gait      Subjective Assessment - 09/21/15 1116    Subjective Pt rates her pain as 3/10 right hip.    How long can you sit comfortably? Pt s/p Rt hip 08/21/2015 and Lt hip replacement 07/17/15. Pt wit degenerative disc dissease in spine, pt wearing elastic back support.     How long can you stand comfortably? 20 minutes   How long can you walk comfortably? < 5 minutes   Patient Stated Goals improve gait, hip strength and endurance   Currently in Pain? Yes  Pain Score 3    Pain Location Leg   Pain Orientation Right   Pain Descriptors / Indicators Sore;Aching   Pain Onset More than a month ago   Pain Frequency Constant   Aggravating Factors  movement, walking, standing, getting into a car.   Pain Relieving Factors rest, Tylenol   Multiple Pain Sites No                         [facility] Adult PT Treatment/Exercise - 09/21/15 0001    Transfers   Transfers Sit to Stand;Stand to Sit  10 reps in 36 sec, 10 reps 18 sec   Exercises   Exercises Lumbar;Knee/Hip;Ankle   Knee/Hip Exercises: Aerobic   Stationary Bike L 1 x 6 min    Nustep Level 2x 5 minutes  seat #5, arms # 8   Knee/Hip Exercises: Standing   Hip Flexion Stengthening;Both;2 sets;10 reps;Knee straight  2.5# added   Hip Abduction Stengthening;Both;2 sets;10 reps  2 x10   Hip Extension Both;Stengthening;2  sets;10 reps  2 x10    Knee/Hip Exercises: Seated   Long Arc Quad Both;2 sets;10 reps  2.5# added   Ankle Exercises: Seated   Heel Raises 20 reps   Toe Raise 20 reps                  PT Short Term Goals - 09/18/15 1248    PT SHORT TERM GOAL #1   Title be independent in initial HEP   Time 4   Period Weeks   Status On-going   PT SHORT TERM GOAL #2   Title demonstrate 4/5 Rt hip strength to improve endurance   Time 4   Period Weeks   Status On-going   PT SHORT TERM GOAL #3   Title improve hip strength to ascend steps with step-over-step with use of 1 rail   Time 4   Period Weeks   Status On-going           PT Long Term Goals - 09/11/15 1458    PT LONG TERM GOAL #1   Title be independent in advanced HEP   Baseline no HEP   Time 8   Period Weeks   Status New   PT LONG TERM GOAL #2   Title reduce FOTO to < or = to 62% limitation   Baseline 73%   Time 8   Period Weeks   Status New   PT LONG TERM GOAL #3   Title demonstrate 4+/5 Rt hip strength to improve endurance for standing and walking   Time 8   Period Weeks   Status New   PT LONG TERM GOAL #4   Title wean from walker for all distances   Time 8   Period Weeks   Status New   PT LONG TERM GOAL #5   Title walk for 20 minutes in the community without need to rest   Time 8   Period Weeks   Status New   Additional Long Term Goals   Additional Long Term Goals Yes   PT LONG TERM GOAL #6   Title report < or = to 4/10 hip pain with standing for house work   Time 8   Period Weeks   Status New               Plan - 09/21/15 1134    Clinical Impression Statement Pt is able to tolerate adding of weight to standing and sitting  exercises. Pt will continue to benefit from skilled PT to improve strength, endurance, ROM in bil hips   Pt will benefit from skilled therapeutic intervention in order to improve on the following deficits Abnormal gait;Decreased range of motion;Difficulty  walking;Pain;Decreased strength;Decreased mobility;Decreased balance;Decreased activity tolerance   Rehab Potential Good   PT Frequency 2x / week   PT Duration 8 weeks   PT Treatment/Interventions ADLs/Self Care Home Management;Cryotherapy;Electrical Stimulation;Moist Heat;Therapeutic exercise;Therapeutic activities;Functional mobility training;Stair training;Gait training;Ultrasound;Patient/family education;Manual techniques;Passive range of motion;Neuromuscular re-education;Balance training   PT Next Visit Plan Bil hip strength, core strength exercises for HEP, gait, steps, pain management as needed.  Add core strength to HEP   Consulted and Agree with Plan of Care Patient        Problem List Patient Active Problem List   Diagnosis Date Noted  . GERD (gastroesophageal reflux disease) 07/23/2015  . Chronic pain 07/23/2015  . Obese 07/19/2015  . S/P left THA, AA 07/17/2015  . 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 Insomnia 03/03/2011  . OSA (obstructive sleep apnea) 03/03/2011  . Depression, major, recurrent ([place]) 12/02/2010  . CONSTIPATION, SLOW TRANSIT 10/04/2010  . DIVERTICULITIS, COLON, WITH PERFORATION 08/27/2010  . ABDOMINAL PAIN, LEFT LOWER QUADRANT 08/16/2010  . BACTERIAL VAGINITIS 05/28/2010  . IRRITABLE BOWEL SYNDROME 08/14/2009  . CYSTITIS, CHRONIC INTERSTITIAL 06/27/2009  . UNSPECIFIED HYPOTHYROIDISM 05/16/2009  . LIPOMA OF OTHER SPECIFIED SITES 04/13/2009  . BRUXISM 04/13/2009  . SYNCOPE 03/13/2009  . MRI, BRAIN, ABNORMAL 02/07/2009  . MUSCLE WEAKNESS (GENERALIZED) 12/20/2008  . CERUMEN IMPACTION, BILATERAL 09/18/2008  . CANDIDIASIS OF UNSPECIFIED SITE 08/30/2008  . UNSPECIFIED ALLERGIC ALVEOLITIS AND PNEUMONITIS 08/02/2008  . HIP PAIN, LEFT, CHRONIC 07/04/2008  . MALAISE AND FATIGUE 07/04/2008  . LOW BACK PAIN 05/26/2008  . CHEST PAIN, ATYPICAL 03/02/2008  . EXTRINSIC ASTHMA, WITH EXACERBATION 02/03/2008  . DIABETES MELLITUS, TYPE II, UNCONTROLLED  01/06/2008  . PROTEINURIA 12/23/2007  . HYPERLIPIDEMIA 11/18/2007  . HYPERCALCEMIA 11/18/2007  . Essential hypertension 09/14/2007  . ANEMIA, B12 DEFICIENCY 05/18/2007  . 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 ALLERGIC RHINITIS 03/29/2007  . FIBROMYALGIA 03/29/2007    NAUMANN-HOUEGNIFIO,Jeri Rawlins PTA 09/21/2015, 11:59 AM  [facility] [address], [place], [place], [address] Phone: [phone]   Fax:  [phone]  Name: Kristin Pope MRN: [id number] Date of Birth: Dec 06, 1955

## 2015-09-28 ENCOUNTER — Other Ambulatory Visit: Payer: Self-pay | Admitting: Internal Medicine

## 2015-10-03 ENCOUNTER — Encounter: Payer: Medicaid Other | Admitting: Physical Therapy

## 2015-10-09 ENCOUNTER — Ambulatory Visit: Payer: Medicaid Other | Admitting: Physical Therapy

## 2015-10-11 ENCOUNTER — Encounter: Payer: Self-pay | Admitting: Physical Therapy

## 2015-10-11 ENCOUNTER — Ambulatory Visit: Payer: Medicaid Other | Attending: Orthopedic Surgery | Admitting: Physical Therapy

## 2015-10-11 DIAGNOSIS — Z471 Aftercare following joint replacement surgery: Secondary | ICD-10-CM | POA: Insufficient documentation

## 2015-10-11 DIAGNOSIS — Z96643 Presence of artificial hip joint, bilateral: Secondary | ICD-10-CM | POA: Insufficient documentation

## 2015-10-11 DIAGNOSIS — M6289 Other specified disorders of muscle: Secondary | ICD-10-CM | POA: Diagnosis present

## 2015-10-11 DIAGNOSIS — R29898 Other symptoms and signs involving the musculoskeletal system: Secondary | ICD-10-CM

## 2015-10-11 DIAGNOSIS — R269 Unspecified abnormalities of gait and mobility: Secondary | ICD-10-CM | POA: Insufficient documentation

## 2015-10-11 NOTE — Therapy (Signed)
[facility] [address] [place], [place], [address] Phone: [phone]   Fax:  [phone]  Physical Therapy Treatment  Patient Details  Name: Kristin Pope MRN: [id number] Date of Birth: 31-Oct-1955 Referring Provider: Paralee Cancel, MD  Encounter Date: 10/11/2015      PT End of Session - 10/11/15 1527    Visit Number 4   Date for PT Re-Evaluation 11/06/15   Authorization Type Medicaid   Authorization Time Period 16 visits 09/14/15-11/08/15   Authorization - Visit Number 4   Authorization - Number of Visits 16   PT Start Time [id number]   PT Stop Time 1535   PT Time Calculation (min) 50 min   Activity Tolerance Patient tolerated treatment well   Behavior During Therapy [facility] for tasks assessed/performed      Past Medical History  Diagnosis Date  . Allergy   . Depression   . Fibromyalgia   . Hypertension   . Hyperlipidemia   . Low back pain   . Vaginitis     atropic-ongoing abnormal vaginal bleeding-had ultrasound and biopsy in last couple months  . Chronic fatigue   . Headache     constant headaches  . Arthritis     neck and spine, with bone spurs  . Torn rotator cuff     right shoulder  . Family history of adverse reaction to anesthesia     sister has problems waking up  . Reactive airway disease   . Diverticulitis 2011    HAD BOWEL PERFORATION  . Sleep apnea     does not use sleep - was told did not need cpap  . Abnormal brain MRI     per pt   . Hx: UTI (urinary tract infection)   . Anemia, pernicious   . Borderline diabetes     no meds  . Anxiety   . DDD (degenerative disc disease), cervical     ALSO LUMBAR AREA  . Hx of pyelonephritis 2007  . MVP (mitral valve prolapse)     Past Surgical History  Procedure Laterality Date  . Hysteroscopy  01/2011  . Radial optic neurotomy      twice in lumbar area of back-every 6 months  . Colonoscopy w/ polypectomy    . Breast surgery      breast  biopsy-benign  . Dilation and curettage of uterus    . Total hip arthroplasty Left 07/17/2015    Procedure: LEFT TOTAL HIP ARTHROPLASTY ANTERIOR APPROACH;  Surgeon: Paralee Cancel, MD;  Location: [facility];  Service: Orthopedics;  Laterality: Left;  . Total hip arthroplasty Right 08/21/2015    Procedure: RIGHT TOTAL HIP ARTHROPLASTY ANTERIOR APPROACH;  Surgeon: Paralee Cancel, MD;  Location: [facility];  Service: Orthopedics;  Laterality: Right;    There were no vitals filed for this visit.  Visit Diagnosis:  Aftercare following bilateral hip joint replacement surgery  Weakness of both hips  Abnormality of gait      Subjective Assessment - 10/11/15 1453    Subjective I wanted to know if I can get up and down from floor. Goal is to get on the floor to do Pilates and kneel.  My back is a bigger problem than my hips.    Pertinent History chronic LBP with nerve ablasion. Lt hip replacement 07/17/15, Rt hip replacment 08/21/15   How long can you sit comfortably? Pt s/p Rt hip 08/21/2015 and Lt hip replacement 07/17/15. Pt wit degenerative disc dissease in spine, pt  wearing elastic back support.     How long can you stand comfortably? 20 minutes   How long can you walk comfortably? < 5 minutes   Patient Stated Goals improve gait, hip strength and endurance   Currently in Pain? Yes   Pain Score 2    Pain Location Hip   Pain Orientation Left;Right   Pain Descriptors / Indicators Aching;Sore   Pain Type Surgical pain   Pain Frequency Constant   Aggravating Factors  movement, walking, standing, getting into a car   Pain Relieving Factors rest, tylenos   Multiple Pain Sites Yes   Pain Score 4   Pain Location Back   Pain Orientation Upper;Mid;Lower   Pain Descriptors / Indicators Constant   Pain Type Chronic pain   Pain Onset More than a month ago   Pain Frequency Constant   Aggravating Factors  walking, sitting without back support,    Pain Relieving Factors sit with pillow behind her back                          [facility] Adult PT Treatment/Exercise - 10/11/15 0001    Lumbar Exercises: Supine   Ab Set 10 reps  with pelvic floor contraction   Clam 15 reps  each leg with abdominal contraction   Heel Slides 10 reps  each leg with abdominal bracing   Bent Knee Raise 10 reps  each leg with abdominal contraction    Knee/Hip Exercises: Aerobic   Stationary Bike L 1 x 10 min    Knee/Hip Exercises: Standing   Hip Flexion Stengthening;Both;2 sets;10 reps;Knee straight  no hands   Hip Abduction Stengthening;Both;2 sets;10 reps  2 x10   Hip Extension Both;Stengthening;2 sets;10 reps  2 x10 ; Verbal cues on posture                PT Education - 10/11/15 1527    Education provided Yes   Education Details transverse abdominal with hip movements   Person(s) Educated Patient   Methods Explanation;Demonstration;Tactile cues;Verbal cues;Handout   Comprehension Returned demonstration;Verbal cues required;Verbalized understanding          PT Short Term Goals - 10/11/15 1530    PT SHORT TERM GOAL #1   Title be independent in initial HEP   Time 4   Period Weeks   Status Achieved   PT SHORT TERM GOAL #2   Title demonstrate 4/5 Rt hip strength to improve endurance   Time 4   Period Weeks   Status On-going   PT SHORT TERM GOAL #3   Title improve hip strength to ascend steps with step-over-step with use of 1 rail   Time 4   Period Weeks   Status On-going   PT SHORT TERM GOAL #4   Title walk for 15 minutes without rest   Time 4   Period Weeks   Status New           PT Long Term Goals - 09/11/15 1458    PT LONG TERM GOAL #1   Title be independent in advanced HEP   Baseline no HEP   Time 8   Period Weeks   Status New   PT LONG TERM GOAL #2   Title reduce FOTO to < or = to 62% limitation   Baseline 73%   Time 8   Period Weeks   Status New   PT LONG TERM GOAL #3   Title demonstrate 4+/5 Rt hip strength  to improve endurance for standing  and walking   Time 8   Period Weeks   Status New   PT LONG TERM GOAL #4   Title wean from walker for all distances   Time 8   Period Weeks   Status New   PT LONG TERM GOAL #5   Title walk for 20 minutes in the community without need to rest   Time 8   Period Weeks   Status New   Additional Long Term Goals   Additional Long Term Goals Yes   PT LONG TERM GOAL #6   Title report < or = to 4/10 hip pain with standing for house work   Time 8   Period Weeks   Status New               Plan - 10/11/15 1528    Clinical Impression Statement Patient wants to learn pilates for her back so she can do the pilates class and get on the floor.  Patient has learned basic pilates exercies for her core.  Patient needed verbal cues to perform her exercises correctly and contract her abdominals.  Patient works hard in therapy.  Patiwnt will benefir from therapy to reduce painand increase strength.    Pt will benefit from skilled therapeutic intervention in order to improve on the following deficits Abnormal gait;Decreased range of motion;Difficulty walking;Pain;Decreased strength;Decreased mobility;Decreased balance;Decreased activity tolerance   Rehab Potential Good   Clinical Impairments Affecting Rehab Potential None   PT Frequency 2x / week   PT Duration 8 weeks   PT Treatment/Interventions ADLs/Self Care Home Management;Cryotherapy;Electrical Stimulation;Moist Heat;Therapeutic exercise;Therapeutic activities;Functional mobility training;Stair training;Gait training;Ultrasound;Patient/family education;Manual techniques;Passive range of motion;Neuromuscular re-education;Balance training   PT Next Visit Plan Bil hip strength, core strength exercises for HEP, gait, steps, pain management as needed.  core strength ; work on getting down on floor   PT Home Exercise Plan progress as needed   Consulted and Agree with Plan of Care Patient        Problem List Patient Active Problem List    Diagnosis Date Noted  . GERD (gastroesophageal reflux disease) 07/23/2015  . Chronic pain 07/23/2015  . Obese 07/19/2015  . S/P left THA, AA 07/17/2015  . 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 Insomnia 03/03/2011  . OSA (obstructive sleep apnea) 03/03/2011  . Depression, major, recurrent ([place]) 12/02/2010  . CONSTIPATION, SLOW TRANSIT 10/04/2010  . DIVERTICULITIS, COLON, WITH PERFORATION 08/27/2010  . ABDOMINAL PAIN, LEFT LOWER QUADRANT 08/16/2010  . BACTERIAL VAGINITIS 05/28/2010  . IRRITABLE BOWEL SYNDROME 08/14/2009  . CYSTITIS, CHRONIC INTERSTITIAL 06/27/2009  . UNSPECIFIED HYPOTHYROIDISM 05/16/2009  . LIPOMA OF OTHER SPECIFIED SITES 04/13/2009  . BRUXISM 04/13/2009  . SYNCOPE 03/13/2009  . MRI, BRAIN, ABNORMAL 02/07/2009  . MUSCLE WEAKNESS (GENERALIZED) 12/20/2008  . CERUMEN IMPACTION, BILATERAL 09/18/2008  . CANDIDIASIS OF UNSPECIFIED SITE 08/30/2008  . UNSPECIFIED ALLERGIC ALVEOLITIS AND PNEUMONITIS 08/02/2008  . HIP PAIN, LEFT, CHRONIC 07/04/2008  . MALAISE AND FATIGUE 07/04/2008  . LOW BACK PAIN 05/26/2008  . CHEST PAIN, ATYPICAL 03/02/2008  . EXTRINSIC ASTHMA, WITH EXACERBATION 02/03/2008  . DIABETES MELLITUS, TYPE II, UNCONTROLLED 01/06/2008  . PROTEINURIA 12/23/2007  . HYPERLIPIDEMIA 11/18/2007  . HYPERCALCEMIA 11/18/2007  . Essential hypertension 09/14/2007  . ANEMIA, B12 DEFICIENCY 05/18/2007  . 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 ALLERGIC RHINITIS 03/29/2007  . FIBROMYALGIA 03/29/2007    Earlie Counts, PT 10/11/2015 3:32 PM   [facility] [address]  [place], [place], [place], [address] Phone: [phone]   Fax:  [phone]  Name: Kristin Pope MRN: [id number] Date of Birth: 11-18-1955

## 2015-10-11 NOTE — Patient Instructions (Signed)
Lower abdominal/core stability exercises  1. Practice your breathing technique: Inhale through your nose expanding your belly and rib cage. Try not to breathe into your chest. Exhale slowly and gradually out your mouth feeling a sense of softness to your body. Practice multiple times. This can be performed unlimited.  2. Finding the lower abdominals. Laying on your back with the knees bent, place your fingers just below your belly button. Using your breathing technique from above, on your exhale gently pull the belly button away from your fingertips without tensing any other muscles. Practice this 5x. Next, as you exhale, draw belly button inwards and hold onto it...then feel as if you are pulling that muscle across your pelvis like you are tightening a belt. This can be hard to do at first so be patient and practice. Do 5-10 reps 1-3 x day. Always recognize quality over quantity; if your abdominal muscles become tired you will notice you may tighten/contract other muscles. This is the time to take a break.   Practice this first laying on your back, then in sitting, progressing to standing and finally adding it to all your daily movements.   3. Finding your pelvic floor. Using the breathing technique above, when your exhale, this time draw your pelvic floor muscles up as if you were attempting to stop the flow of urination. Be careful NOT to tense any other muscles. This can be hard, BE PATIENT. Try to hold up to 10 seconds repeating 10x. Try 2x a day. Once you feel you are doing this well, add this contraction to exercise #2. First contracting your pelvic floor followed by lower abdominals.   4. Adding leg movements. Add the following leg movements to challenge your ability to keep your core stable:  1. Single leg drop outs: Laying on your back with knees bent feet flat. Inhale,  dropping one knee outward KEEPING YOUR PELVIS STILL. Exhale as you bring the leg back, simultaneously performing your lower  abdominal contraction. Do 5-10 on each leg.   2. Marching: While keeping your pelvis still, lift the right foot a few inches, put it down then lift left foot. This will mimic a march. Start slow to establish control. Once you have control you may speed it up. Do 10-20x. You MUST keep your lower abdominlas contracted while you march. Breathe naturally    3. Single leg slides: Inhale while you slowly slide one leg out keeping your pelvis still. Only slide your leg as far as you can keep your pelvis still. Exhale as you bring the leg back to the start, contracting the lower abdominals as you do that. Keep your upper body relaxed. Do 5-10 on each side.    [facility] [address], [address] [place],  [address] Phone # [phone] Fax [phone]       

## 2015-10-16 ENCOUNTER — Encounter: Payer: Medicaid Other | Admitting: Physical Therapy

## 2015-10-18 ENCOUNTER — Encounter: Payer: Medicaid Other | Admitting: Physical Therapy

## 2015-10-18 ENCOUNTER — Ambulatory Visit: Payer: Medicaid Other | Admitting: Physical Therapy

## 2015-10-18 ENCOUNTER — Encounter: Payer: Self-pay | Admitting: Physical Therapy

## 2015-10-18 DIAGNOSIS — Z471 Aftercare following joint replacement surgery: Secondary | ICD-10-CM | POA: Diagnosis not present

## 2015-10-18 DIAGNOSIS — R269 Unspecified abnormalities of gait and mobility: Secondary | ICD-10-CM

## 2015-10-18 DIAGNOSIS — R29898 Other symptoms and signs involving the musculoskeletal system: Secondary | ICD-10-CM

## 2015-10-18 DIAGNOSIS — Z96643 Presence of artificial hip joint, bilateral: Principal | ICD-10-CM

## 2015-10-18 NOTE — Therapy (Signed)
[facility] [address] [place], [place], [address] Phone: [phone]   Fax:  [phone]  Physical Therapy Treatment  Patient Details  Name: Kristin Pope MRN: [id number] Date of Birth: 1955-10-01 Referring Provider: Paralee Cancel, MD  Encounter Date: 10/18/2015      PT End of Session - 10/18/15 1502    Visit Number 5   Date for PT Re-Evaluation 11/06/15   Authorization Type Medicaid   Authorization Time Period 16 visits 09/14/15-11/08/15   Authorization - Visit Number 5   Authorization - Number of Visits 16   PT Start Time [id number]   PT Stop Time 1530   PT Time Calculation (min) 46 min   Activity Tolerance Patient tolerated treatment well   Behavior During Therapy [facility] for tasks assessed/performed      Past Medical History  Diagnosis Date  . Allergy   . Depression   . Fibromyalgia   . Hypertension   . Hyperlipidemia   . Low back pain   . Vaginitis     atropic-ongoing abnormal vaginal bleeding-had ultrasound and biopsy in last couple months  . Chronic fatigue   . Headache     constant headaches  . Arthritis     neck and spine, with bone spurs  . Torn rotator cuff     right shoulder  . Family history of adverse reaction to anesthesia     sister has problems waking up  . Reactive airway disease   . Diverticulitis 2011    HAD BOWEL PERFORATION  . Sleep apnea     does not use sleep - was told did not need cpap  . Abnormal brain MRI     per pt   . Hx: UTI (urinary tract infection)   . Anemia, pernicious   . Borderline diabetes     no meds  . Anxiety   . DDD (degenerative disc disease), cervical     ALSO LUMBAR AREA  . Hx of pyelonephritis 2007  . MVP (mitral valve prolapse)     Past Surgical History  Procedure Laterality Date  . Hysteroscopy  01/2011  . Radial optic neurotomy      twice in lumbar area of back-every 6 months  . Colonoscopy w/ polypectomy    . Breast surgery      breast  biopsy-benign  . Dilation and curettage of uterus    . Total hip arthroplasty Left 07/17/2015    Procedure: LEFT TOTAL HIP ARTHROPLASTY ANTERIOR APPROACH;  Surgeon: Paralee Cancel, MD;  Location: [facility];  Service: Orthopedics;  Laterality: Left;  . Total hip arthroplasty Right 08/21/2015    Procedure: RIGHT TOTAL HIP ARTHROPLASTY ANTERIOR APPROACH;  Surgeon: Paralee Cancel, MD;  Location: [facility];  Service: Orthopedics;  Laterality: Right;    There were no vitals filed for this visit.  Visit Diagnosis:  Aftercare following bilateral hip joint replacement surgery  Weakness of both hips  Abnormality of gait      Subjective Assessment - 10/18/15 1457    Subjective I feel better today than on Tuesday, however, the legs are still weak and it is diffiult with transfers sit to stand. Pt.'s goal is to get on the floor to do Pilates and kneel.   Pertinent History chronic LBP with nerve ablasion. Lt hip replacement 07/17/15, Rt hip replacment 08/21/15   Limitations Standing;Walking;Sitting   How long can you sit comfortably? Pt s/p Rt hip 08/21/2015 and Lt hip replacement 07/17/15. Pt wit degenerative disc dissease  in spine, pt wearing elastic back support.     How long can you stand comfortably? 20 minutes   How long can you walk comfortably? < 5 minutes   Patient Stated Goals improve gait, hip strength and endurance   Currently in Pain? Yes   Pain Score 2    Pain Location Hip   Pain Orientation Right;Left   Pain Descriptors / Indicators Aching;Sore   Pain Type Surgical pain   Pain Onset More than a month ago   Pain Frequency Constant   Aggravating Factors  movement, walking, standing, getting into and out a car, transfer sit to stand   Pain Relieving Factors rest, tylenol   Multiple Pain Sites Yes   Pain Score 4   Pain Location Back   Pain Orientation Upper;Mid;Lower   Pain Descriptors / Indicators Constant   Pain Type Chronic pain   Pain Onset More than a month ago   Pain Frequency  Constant   Aggravating Factors  walking, sitting without back support   Pain Relieving Factors sitting with pillow behind her back                         [facility] Adult PT Treatment/Exercise - 10/18/15 0001    Knee/Hip Exercises: Aerobic   Nustep Level 2x 5 minutes  seat#5, arms#8 0.64mph   Knee/Hip Exercises: Standing   Hip Flexion Stengthening;Both;2 sets;10 reps;Knee straight  2.5# added   Hip Abduction Stengthening;Both;2 sets;10 reps  2.5#added   Hip Extension Both;Stengthening;2 sets;10 reps  2.5# added   Forward Step Up 2 sets;10 reps  2x10 with light bil UE support   Rebounder 3 directions  x 1 min each, with light UE support   Other Standing Knee Exercises Half squatting with B UE support on stair case                  PT Short Term Goals - 10/11/15 1530    PT SHORT TERM GOAL #1   Title be independent in initial HEP   Time 4   Period Weeks   Status Achieved   PT SHORT TERM GOAL #2   Title demonstrate 4/5 Rt hip strength to improve endurance   Time 4   Period Weeks   Status On-going   PT SHORT TERM GOAL #3   Title improve hip strength to ascend steps with step-over-step with use of 1 rail   Time 4   Period Weeks   Status On-going   PT SHORT TERM GOAL #4   Title walk for 15 minutes without rest   Time 4   Period Weeks   Status New           PT Long Term Goals - 09/11/15 1458    PT LONG TERM GOAL #1   Title be independent in advanced HEP   Baseline no HEP   Time 8   Period Weeks   Status New   PT LONG TERM GOAL #2   Title reduce FOTO to < or = to 62% limitation   Baseline 73%   Time 8   Period Weeks   Status New   PT LONG TERM GOAL #3   Title demonstrate 4+/5 Rt hip strength to improve endurance for standing and walking   Time 8   Period Weeks   Status New   PT LONG TERM GOAL #4   Title wean from walker for all distances   Time 8   Period Weeks  Status New   PT LONG TERM GOAL #5   Title walk for 20 minutes in  the community without need to rest   Time 8   Period Weeks   Status New   Additional Long Term Goals   Additional Long Term Goals Yes   PT LONG TERM GOAL #6   Title report < or = to 4/10 hip pain with standing for house work   Time 8   Period Weeks   Status New               Plan - 10/18/15 1503    Clinical Impression Statement Pt less guarding movement when arrived in PT clinic and with activities. Patient will continue to benefit from therapy to reduce pain and increase strength   Pt will benefit from skilled therapeutic intervention in order to improve on the following deficits Abnormal gait;Decreased range of motion;Difficulty walking;Pain;Decreased strength;Decreased mobility;Decreased balance;Decreased activity tolerance   Rehab Potential Good   Clinical Impairments Affecting Rehab Potential None   PT Frequency 2x / week   PT Duration 8 weeks   PT Treatment/Interventions ADLs/Self Care Home Management;Cryotherapy;Electrical Stimulation;Moist Heat;Therapeutic exercise;Therapeutic activities;Functional mobility training;Stair training;Gait training;Ultrasound;Patient/family education;Manual techniques;Passive range of motion;Neuromuscular re-education;Balance training   PT Next Visit Plan Bil hip strength, gait, steps, pain management as needed.  core strength ; work on getting down on floor   PT Home Exercise Plan progress as needed   Consulted and Agree with Plan of Care Patient        Problem List Patient Active Problem List   Diagnosis Date Noted  . GERD (gastroesophageal reflux disease) 07/23/2015  . Chronic pain 07/23/2015  . Obese 07/19/2015  . S/P left THA, AA 07/17/2015  . 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 Insomnia 03/03/2011  . OSA (obstructive sleep apnea) 03/03/2011  . Depression, major, recurrent ([place]) 12/02/2010  . CONSTIPATION, SLOW TRANSIT 10/04/2010  . DIVERTICULITIS, COLON, WITH PERFORATION 08/27/2010  . ABDOMINAL PAIN, LEFT LOWER  QUADRANT 08/16/2010  . BACTERIAL VAGINITIS 05/28/2010  . IRRITABLE BOWEL SYNDROME 08/14/2009  . CYSTITIS, CHRONIC INTERSTITIAL 06/27/2009  . UNSPECIFIED HYPOTHYROIDISM 05/16/2009  . LIPOMA OF OTHER SPECIFIED SITES 04/13/2009  . BRUXISM 04/13/2009  . SYNCOPE 03/13/2009  . MRI, BRAIN, ABNORMAL 02/07/2009  . MUSCLE WEAKNESS (GENERALIZED) 12/20/2008  . CERUMEN IMPACTION, BILATERAL 09/18/2008  . CANDIDIASIS OF UNSPECIFIED SITE 08/30/2008  . UNSPECIFIED ALLERGIC ALVEOLITIS AND PNEUMONITIS 08/02/2008  . HIP PAIN, LEFT, CHRONIC 07/04/2008  . MALAISE AND FATIGUE 07/04/2008  . LOW BACK PAIN 05/26/2008  . CHEST PAIN, ATYPICAL 03/02/2008  . EXTRINSIC ASTHMA, WITH EXACERBATION 02/03/2008  . DIABETES MELLITUS, TYPE II, UNCONTROLLED 01/06/2008  . PROTEINURIA 12/23/2007  . HYPERLIPIDEMIA 11/18/2007  . HYPERCALCEMIA 11/18/2007  . Essential hypertension 09/14/2007  . ANEMIA, B12 DEFICIENCY 05/18/2007  . 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 ALLERGIC RHINITIS 03/29/2007  . FIBROMYALGIA 03/29/2007    NAUMANN-HOUEGNIFIO,Kristin Pope PTA 10/18/2015, 3:27 PM  [facility] [address] [place], [place], [address] Phone: [phone]   Fax:  [phone]  Name: Kristin Pope MRN: [id number] Date of Birth: 08-Feb-1956

## 2015-10-19 ENCOUNTER — Encounter: Payer: Medicaid Other | Admitting: Physical Therapy

## 2015-10-23 ENCOUNTER — Encounter: Payer: Self-pay | Admitting: Physical Therapy

## 2015-10-23 ENCOUNTER — Ambulatory Visit: Payer: Medicaid Other | Admitting: Physical Therapy

## 2015-10-23 DIAGNOSIS — R269 Unspecified abnormalities of gait and mobility: Secondary | ICD-10-CM

## 2015-10-23 DIAGNOSIS — Z471 Aftercare following joint replacement surgery: Secondary | ICD-10-CM | POA: Diagnosis not present

## 2015-10-23 DIAGNOSIS — Z96643 Presence of artificial hip joint, bilateral: Principal | ICD-10-CM

## 2015-10-23 DIAGNOSIS — R29898 Other symptoms and signs involving the musculoskeletal system: Secondary | ICD-10-CM

## 2015-10-23 NOTE — Therapy (Signed)
[facility] [address] [place], [place], [address] Phone: [phone]   Fax:  [phone]  Physical Therapy Treatment  Patient Details  Name: Kristin Pope MRN: [id number] Date of Birth: May 23, 1956 Referring Provider: Paralee Cancel, MD  Encounter Date: 10/23/2015      PT End of Session - 10/23/15 1510    Visit Number 6   Date for PT Re-Evaluation 11/06/15   Authorization Type Medicaid   Authorization Time Period 16 visits 09/14/15-11/08/15   Authorization - Visit Number 6   Authorization - Number of Visits 16   PT Start Time [id number]   PT Stop Time 1531   PT Time Calculation (min) 46 min   Activity Tolerance Patient tolerated treatment well   Behavior During Therapy [facility] for tasks assessed/performed      Past Medical History  Diagnosis Date  . Allergy   . Depression   . Fibromyalgia   . Hypertension   . Hyperlipidemia   . Low back pain   . Vaginitis     atropic-ongoing abnormal vaginal bleeding-had ultrasound and biopsy in last couple months  . Chronic fatigue   . Headache     constant headaches  . Arthritis     neck and spine, with bone spurs  . Torn rotator cuff     right shoulder  . Family history of adverse reaction to anesthesia     sister has problems waking up  . Reactive airway disease   . Diverticulitis 2011    HAD BOWEL PERFORATION  . Sleep apnea     does not use sleep - was told did not need cpap  . Abnormal brain MRI     per pt   . Hx: UTI (urinary tract infection)   . Anemia, pernicious   . Borderline diabetes     no meds  . Anxiety   . DDD (degenerative disc disease), cervical     ALSO LUMBAR AREA  . Hx of pyelonephritis 2007  . MVP (mitral valve prolapse)     Past Surgical History  Procedure Laterality Date  . Hysteroscopy  01/2011  . Radial optic neurotomy      twice in lumbar area of back-every 6 months  . Colonoscopy w/ polypectomy    . Breast surgery      breast  biopsy-benign  . Dilation and curettage of uterus    . Total hip arthroplasty Left 07/17/2015    Procedure: LEFT TOTAL HIP ARTHROPLASTY ANTERIOR APPROACH;  Surgeon: Paralee Cancel, MD;  Location: [facility];  Service: Orthopedics;  Laterality: Left;  . Total hip arthroplasty Right 08/21/2015    Procedure: RIGHT TOTAL HIP ARTHROPLASTY ANTERIOR APPROACH;  Surgeon: Paralee Cancel, MD;  Location: [facility];  Service: Orthopedics;  Laterality: Right;    There were no vitals filed for this visit.  Visit Diagnosis:  Aftercare following bilateral hip joint replacement surgery  Weakness of both hips  Abnormality of gait      Subjective Assessment - 10/23/15 1502    Subjective My body as a whole is hurting, some swelling on Rt hip. along incision side. Both legs are still weak. pt's goal is to ge on the floor to do Pilates and be able to kneel    Pertinent History chronic LBP with nerve ablasion. Lt hip replacement 07/17/15, Rt hip replacment 08/21/15   Limitations Standing;Walking;Sitting   How long can you sit comfortably? Pt s/p Rt hip 08/21/2015 and Lt hip replacement 07/17/15. Pt wit degenerative  disc dissease in spine, pt wearing elastic back support.     How long can you stand comfortably? 20 minutes   How long can you walk comfortably? < 5 minutes   Patient Stated Goals improve gait, hip strength and endurance   Currently in Pain? Yes   Pain Score 3    Pain Location Hip   Pain Orientation Right;Left   Pain Descriptors / Indicators Aching;Sore   Pain Type Surgical pain   Pain Onset More than a month ago   Pain Frequency Constant   Aggravating Factors  movement, walking, standing, getting into and out of a car, transfer sit to stand   Pain Relieving Factors rest, tylenol   Multiple Pain Sites Yes   Pain Score 4   Pain Location Back   Pain Orientation Mid;Lower;Upper   Pain Descriptors / Indicators Constant   Pain Type Chronic pain   Pain Onset More than a month ago   Pain Frequency  Constant   Aggravating Factors  walking sitting, without back support   Pain Relieving Factors sitting with pillow behind her back                         [facility] Adult PT Treatment/Exercise - 10/23/15 0001    Exercises   Exercises Lumbar;Knee/Hip;Ankle   Knee/Hip Exercises: Aerobic   Stationary Bike L 1 x 10 min    Knee/Hip Exercises: Standing   Hip Flexion --  pt wishes not to perform today, due to active fibromyalgia   Rebounder 3 directions  x 2 min each, with light UE support  atshort times no UE support   Manual Therapy   Manual Therapy Soft tissue mobilization   Soft tissue mobilization PROM bil hip/knee flexion, Hamstring stretch, quadriceps stretch,  Rt gluteal softtissue work along sacral border, gluteus med   Ankle Exercises: Standing   Rocker Board 2 minutes  inot DF/PF and Inv/Ever, standing at sink                  PT Short Term Goals - 10/23/15 1515    PT SHORT TERM GOAL #1   Title be independent in initial HEP   Time 4   Period Weeks   Status Achieved   PT SHORT TERM GOAL #2   Title demonstrate 4/5 Rt hip strength to improve endurance   Time 4   Period Weeks   Status On-going   PT SHORT TERM GOAL #3   Title improve hip strength to ascend steps with step-over-step with use of 1 rail   Time 4   Period Weeks   Status On-going   PT SHORT TERM GOAL #4   Title walk for 15 minutes without rest   Time 4   Period Weeks   Status On-going           PT Long Term Goals - 09/11/15 1458    PT LONG TERM GOAL #1   Title be independent in advanced HEP   Baseline no HEP   Time 8   Period Weeks   Status New   PT LONG TERM GOAL #2   Title reduce FOTO to < or = to 62% limitation   Baseline 73%   Time 8   Period Weeks   Status New   PT LONG TERM GOAL #3   Title demonstrate 4+/5 Rt hip strength to improve endurance for standing and walking   Time 8   Period Weeks   Status New  PT LONG TERM GOAL #4   Title wean from walker for  all distances   Time 8   Period Weeks   Status New   PT LONG TERM GOAL #5   Title walk for 20 minutes in the community without need to rest   Time 8   Period Weeks   Status New   Additional Long Term Goals   Additional Long Term Goals Yes   PT LONG TERM GOAL #6   Title report < or = to 4/10 hip pain with standing for house work   Time 8   Period Weeks   Status New               Plan - 10/23/15 1510    Clinical Impression Statement Pt was able at times to perform exercises on trampoline without UE support. Pt wishes to take it esier today due to overall muscle discomfort/ fibromyalgia. Pt will continue to benefit from skilled PT.   Pt will benefit from skilled therapeutic intervention in order to improve on the following deficits Abnormal gait;Decreased range of motion;Difficulty walking;Pain;Decreased strength;Decreased mobility;Decreased balance;Decreased activity tolerance   Rehab Potential Good   Clinical Impairments Affecting Rehab Potential None   PT Frequency 2x / week   PT Duration 8 weeks   PT Treatment/Interventions ADLs/Self Care Home Management;Cryotherapy;Electrical Stimulation;Moist Heat;Therapeutic exercise;Therapeutic activities;Functional mobility training;Stair training;Gait training;Ultrasound;Patient/family education;Manual techniques;Passive range of motion;Neuromuscular re-education;Balance training   PT Next Visit Plan Bil hip strength, gait, steps, pain management as needed; work on getting down on floor   PT Home Exercise Plan progress as needed   Consulted and Agree with Plan of Care Patient        Problem List Patient Active Problem List   Diagnosis Date Noted  . GERD (gastroesophageal reflux disease) 07/23/2015  . Chronic pain 07/23/2015  . Obese 07/19/2015  . S/P left THA, AA 07/17/2015  . 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 Insomnia 03/03/2011  . OSA (obstructive sleep apnea) 03/03/2011  . Depression, major, recurrent ([place])  12/02/2010  . CONSTIPATION, SLOW TRANSIT 10/04/2010  . DIVERTICULITIS, COLON, WITH PERFORATION 08/27/2010  . ABDOMINAL PAIN, LEFT LOWER QUADRANT 08/16/2010  . BACTERIAL VAGINITIS 05/28/2010  . IRRITABLE BOWEL SYNDROME 08/14/2009  . CYSTITIS, CHRONIC INTERSTITIAL 06/27/2009  . UNSPECIFIED HYPOTHYROIDISM 05/16/2009  . LIPOMA OF OTHER SPECIFIED SITES 04/13/2009  . BRUXISM 04/13/2009  . SYNCOPE 03/13/2009  . MRI, BRAIN, ABNORMAL 02/07/2009  . MUSCLE WEAKNESS (GENERALIZED) 12/20/2008  . CERUMEN IMPACTION, BILATERAL 09/18/2008  . CANDIDIASIS OF UNSPECIFIED SITE 08/30/2008  . UNSPECIFIED ALLERGIC ALVEOLITIS AND PNEUMONITIS 08/02/2008  . HIP PAIN, LEFT, CHRONIC 07/04/2008  . MALAISE AND FATIGUE 07/04/2008  . LOW BACK PAIN 05/26/2008  . CHEST PAIN, ATYPICAL 03/02/2008  . EXTRINSIC ASTHMA, WITH EXACERBATION 02/03/2008  . DIABETES MELLITUS, TYPE II, UNCONTROLLED 01/06/2008  . PROTEINURIA 12/23/2007  . HYPERLIPIDEMIA 11/18/2007  . HYPERCALCEMIA 11/18/2007  . Essential hypertension 09/14/2007  . ANEMIA, B12 DEFICIENCY 05/18/2007  . 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 ALLERGIC RHINITIS 03/29/2007  . FIBROMYALGIA 03/29/2007    NAUMANN-HOUEGNIFIO,Ledon Weihe 10/23/2015, 3:34 PM  [facility] [address] [place], [place], [address] Phone: [phone]   Fax:  [phone]  Name: CHANDRE DERAS MRN: [id number] Date of Birth: [birth year]

## 2015-10-25 ENCOUNTER — Ambulatory Visit: Payer: Medicaid Other | Admitting: Physical Therapy

## 2015-10-25 ENCOUNTER — Encounter: Payer: Self-pay | Admitting: Physical Therapy

## 2015-10-25 DIAGNOSIS — R269 Unspecified abnormalities of gait and mobility: Secondary | ICD-10-CM

## 2015-10-25 DIAGNOSIS — Z471 Aftercare following joint replacement surgery: Secondary | ICD-10-CM

## 2015-10-25 DIAGNOSIS — Z96643 Presence of artificial hip joint, bilateral: Principal | ICD-10-CM

## 2015-10-25 DIAGNOSIS — R29898 Other symptoms and signs involving the musculoskeletal system: Secondary | ICD-10-CM

## 2015-10-25 NOTE — Therapy (Signed)
[facility] [address] [place], [place], [address] Phone: [phone]   Fax:  [phone]  Physical Therapy Treatment  Patient Details  Name: Kristin Pope MRN: [id number] Date of Birth: Nov 22, 1955 Referring Provider: Paralee Cancel, MD  Encounter Date: 10/25/2015      PT End of Session - 10/25/15 1459    Visit Number 7   Date for PT Re-Evaluation 11/06/15   Authorization Type Medicaid   Authorization Time Period 16 visits 09/14/15-11/08/15   Authorization - Visit Number 7   Authorization - Number of Visits 16   PT Start Time 1455   PT Stop Time 1535   PT Time Calculation (min) 40 min   Activity Tolerance Patient tolerated treatment well   Behavior During Therapy [facility] for tasks assessed/performed      Past Medical History  Diagnosis Date  . Allergy   . Depression   . Fibromyalgia   . Hypertension   . Hyperlipidemia   . Low back pain   . Vaginitis     atropic-ongoing abnormal vaginal bleeding-had ultrasound and biopsy in last couple months  . Chronic fatigue   . Headache     constant headaches  . Arthritis     neck and spine, with bone spurs  . Torn rotator cuff     right shoulder  . Family history of adverse reaction to anesthesia     sister has problems waking up  . Reactive airway disease   . Diverticulitis 2011    HAD BOWEL PERFORATION  . Sleep apnea     does not use sleep - was told did not need cpap  . Abnormal brain MRI     per pt   . Hx: UTI (urinary tract infection)   . Anemia, pernicious   . Borderline diabetes     no meds  . Anxiety   . DDD (degenerative disc disease), cervical     ALSO LUMBAR AREA  . Hx of pyelonephritis 2007  . MVP (mitral valve prolapse)     Past Surgical History  Procedure Laterality Date  . Hysteroscopy  01/2011  . Radial optic neurotomy      twice in lumbar area of back-every 6 months  . Colonoscopy w/ polypectomy    . Breast surgery      breast  biopsy-benign  . Dilation and curettage of uterus    . Total hip arthroplasty Left 07/17/2015    Procedure: LEFT TOTAL HIP ARTHROPLASTY ANTERIOR APPROACH;  Surgeon: Paralee Cancel, MD;  Location: [facility];  Service: Orthopedics;  Laterality: Left;  . Total hip arthroplasty Right 08/21/2015    Procedure: RIGHT TOTAL HIP ARTHROPLASTY ANTERIOR APPROACH;  Surgeon: Paralee Cancel, MD;  Location: [facility];  Service: Orthopedics;  Laterality: Right;    There were no vitals filed for this visit.  Visit Diagnosis:  Aftercare following bilateral hip joint replacement surgery  Weakness of both hips  Abnormality of gait      Subjective Assessment - 10/25/15 1529    Subjective Pt reports noticed improved ROM with Right hip flexion after last softtissue treatment. Ambulation is still challenging due to weakness and decr balance.     Pertinent History chronic LBP with nerve ablasion. Lt hip replacement 07/17/15, Rt hip replacment 08/21/15   Limitations Standing;Walking;Sitting   How long can you sit comfortably? Pt s/p Rt hip 08/21/2015 and Lt hip replacement 07/17/15. Pt wit degenerative disc dissease in spine, pt wearing elastic back support.  How long can you stand comfortably? 20 minutes   How long can you walk comfortably? < 5 minutes   Patient Stated Goals improve gait, hip strength and endurance   Currently in Pain? Yes   Pain Score 3    Pain Location Hip   Pain Orientation Right;Left   Pain Descriptors / Indicators Aching;Sore   Pain Type Surgical pain   Pain Onset More than a month ago   Pain Frequency Constant   Aggravating Factors  movement, walking, standing, getting into and out of a car, transfer sit to stand   Pain Relieving Factors rest, tylenol   Multiple Pain Sites Yes   Pain Score 4   Pain Location Back   Pain Orientation Mid;Lower;Upper   Pain Descriptors / Indicators Constant   Pain Type Chronic pain   Pain Onset More than a month ago   Pain Frequency Constant    Aggravating Factors  walking, sitting, without back support   Pain Relieving Factors sitting with pillow behind her back                         [facility] Adult PT Treatment/Exercise - 10/25/15 0001    Exercises   Exercises Lumbar;Knee/Hip;Ankle   Knee/Hip Exercises: Aerobic   Stationary Bike L 2x 10 min    Knee/Hip Exercises: Standing   Hip Flexion AROM;5 reps;Both   Manual Therapy   Manual Therapy Soft tissue mobilization   Soft tissue mobilization PROM bil hip/knee flexion, Hamstring stretch, quadriceps stretch,  Rt gluteal, along sacral border                   PT Short Term Goals - 10/23/15 1515    PT SHORT TERM GOAL #1   Title be independent in initial HEP   Time 4   Period Weeks   Status Achieved   PT SHORT TERM GOAL #2   Title demonstrate 4/5 Rt hip strength to improve endurance   Time 4   Period Weeks   Status On-going   PT SHORT TERM GOAL #3   Title improve hip strength to ascend steps with step-over-step with use of 1 rail   Time 4   Period Weeks   Status On-going   PT SHORT TERM GOAL #4   Title walk for 15 minutes without rest   Time 4   Period Weeks   Status On-going           PT Long Term Goals - 09/11/15 1458    PT LONG TERM GOAL #1   Title be independent in advanced HEP   Baseline no HEP   Time 8   Period Weeks   Status New   PT LONG TERM GOAL #2   Title reduce FOTO to < or = to 62% limitation   Baseline 73%   Time 8   Period Weeks   Status New   PT LONG TERM GOAL #3   Title demonstrate 4+/5 Rt hip strength to improve endurance for standing and walking   Time 8   Period Weeks   Status New   PT LONG TERM GOAL #4   Title wean from walker for all distances   Time 8   Period Weeks   Status New   PT LONG TERM GOAL #5   Title walk for 20 minutes in the community without need to rest   Time 8   Period Weeks   Status New   Additional Long Term  Goals   Additional Long Term Goals Yes   PT LONG TERM GOAL #6    Title report < or = to 4/10 hip pain with standing for house work   Time 8   Period Weeks   Status New               Plan - 10/25/15 1500    Clinical Impression Statement Pt slowly improving with endurance and strength, pt with palpaple tenderness in Rt gluteal and hip area and adductors. Pt will continue to improve with PT to address bil LE leg weakness and improve balance with gait   Pt will benefit from skilled therapeutic intervention in order to improve on the following deficits Abnormal gait;Decreased range of motion;Difficulty walking;Pain;Decreased strength;Decreased mobility;Decreased balance;Decreased activity tolerance   Rehab Potential Good   PT Frequency 2x / week   PT Duration 8 weeks   PT Treatment/Interventions ADLs/Self Care Home Management;Cryotherapy;Electrical Stimulation;Moist Heat;Therapeutic exercise;Therapeutic activities;Functional mobility training;Stair training;Gait training;Ultrasound;Patient/family education;Manual techniques;Passive range of motion;Neuromuscular re-education;Balance training   PT Next Visit Plan Practice going down on the floor and getting up. Bil hip strength, gait, steps, STW to Rt gluteal area   PT Home Exercise Plan progress as needed   Consulted and Agree with Plan of Care Patient        Problem List Patient Active Problem List   Diagnosis Date Noted  . GERD (gastroesophageal reflux disease) 07/23/2015  . Chronic pain 07/23/2015  . Obese 07/19/2015  . S/P left THA, AA 07/17/2015  . 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 Insomnia 03/03/2011  . OSA (obstructive sleep apnea) 03/03/2011  . Depression, major, recurrent ([place]) 12/02/2010  . CONSTIPATION, SLOW TRANSIT 10/04/2010  . DIVERTICULITIS, COLON, WITH PERFORATION 08/27/2010  . ABDOMINAL PAIN, LEFT LOWER QUADRANT 08/16/2010  . BACTERIAL VAGINITIS 05/28/2010  . IRRITABLE BOWEL SYNDROME 08/14/2009  . CYSTITIS, CHRONIC INTERSTITIAL 06/27/2009  . UNSPECIFIED  HYPOTHYROIDISM 05/16/2009  . LIPOMA OF OTHER SPECIFIED SITES 04/13/2009  . BRUXISM 04/13/2009  . SYNCOPE 03/13/2009  . MRI, BRAIN, ABNORMAL 02/07/2009  . MUSCLE WEAKNESS (GENERALIZED) 12/20/2008  . CERUMEN IMPACTION, BILATERAL 09/18/2008  . CANDIDIASIS OF UNSPECIFIED SITE 08/30/2008  . UNSPECIFIED ALLERGIC ALVEOLITIS AND PNEUMONITIS 08/02/2008  . HIP PAIN, LEFT, CHRONIC 07/04/2008  . MALAISE AND FATIGUE 07/04/2008  . LOW BACK PAIN 05/26/2008  . CHEST PAIN, ATYPICAL 03/02/2008  . EXTRINSIC ASTHMA, WITH EXACERBATION 02/03/2008  . DIABETES MELLITUS, TYPE II, UNCONTROLLED 01/06/2008  . PROTEINURIA 12/23/2007  . HYPERLIPIDEMIA 11/18/2007  . HYPERCALCEMIA 11/18/2007  . Essential hypertension 09/14/2007  . ANEMIA, B12 DEFICIENCY 05/18/2007  . 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 ALLERGIC RHINITIS 03/29/2007  . FIBROMYALGIA 03/29/2007    NAUMANN-HOUEGNIFIO,Mikeyla Music PTA 10/25/2015, 3:45 PM  [facility] [address], [place], [place], [address] Phone: [phone]   Fax:  [phone]  Name: OZELLA VANHOOZER MRN: [id number] Date of Birth: 1955-10-10

## 2015-10-30 ENCOUNTER — Ambulatory Visit: Payer: Medicaid Other | Admitting: Physical Therapy

## 2015-10-30 ENCOUNTER — Encounter: Payer: Self-pay | Admitting: Physical Therapy

## 2015-10-30 DIAGNOSIS — Z471 Aftercare following joint replacement surgery: Secondary | ICD-10-CM | POA: Diagnosis not present

## 2015-10-30 DIAGNOSIS — Z96643 Presence of artificial hip joint, bilateral: Principal | ICD-10-CM

## 2015-10-30 DIAGNOSIS — R29898 Other symptoms and signs involving the musculoskeletal system: Secondary | ICD-10-CM

## 2015-10-30 DIAGNOSIS — R269 Unspecified abnormalities of gait and mobility: Secondary | ICD-10-CM

## 2015-10-30 NOTE — Patient Instructions (Addendum)
Copyright  VHI. All rights reserved.  Heel Raise: Bilateral (Standing)   Hold on to sink with both hands. Lift both heels at the same time.  Repeat  10 times per set. Hold it for 3 sec, slowly lower down Do 2-3 sets per session.  Do 2-3 sessions per day.  http://orth.exer.us/738   Copyright  VHI. All rights reserved.  Deep Squat    Stand with feet shoulder width apart and squat, HOLD on to sink or something stable. Repeat  10 times per set.  Do 2-3 sets per session. Do 2-3 sessions per day.  http://orth.exer.us/738   Copyright  VHI. All rights reserved.

## 2015-10-30 NOTE — Therapy (Signed)
[facility] [address], [place], [place], [address] Phone: [phone]   Fax:  [phone]  Physical Therapy Treatment  Patient Details  Name: Kristin Pope MRN: [id number] Date of Birth: 1955/11/06 Referring Provider: Paralee Cancel, MD  Encounter Date: 10/30/2015      PT End of Session - 10/30/15 1455    Visit Number 8   Date for PT Re-Evaluation 11/06/15   Authorization Type Medicaid   Authorization Time Period 16 visits 09/14/15-11/08/15   Authorization - Visit Number 8   Authorization - Number of Visits 16   PT Start Time 3329   PT Stop Time 1534   PT Time Calculation (min) 47 min   Activity Tolerance Patient tolerated treatment well   Behavior During Therapy [facility] for tasks assessed/performed      Past Medical History  Diagnosis Date  . Allergy   . Depression   . Fibromyalgia   . Hypertension   . Hyperlipidemia   . Low back pain   . Vaginitis     atropic-ongoing abnormal vaginal bleeding-had ultrasound and biopsy in last couple months  . Chronic fatigue   . Headache     constant headaches  . Arthritis     neck and spine, with bone spurs  . Torn rotator cuff     right shoulder  . Family history of adverse reaction to anesthesia     sister has problems waking up  . Reactive airway disease   . Diverticulitis 2011    HAD BOWEL PERFORATION  . Sleep apnea     does not use sleep - was told did not need cpap  . Abnormal brain MRI     per pt   . Hx: UTI (urinary tract infection)   . Anemia, pernicious   . Borderline diabetes     no meds  . Anxiety   . DDD (degenerative disc disease), cervical     ALSO LUMBAR AREA  . Hx of pyelonephritis 2007  . MVP (mitral valve prolapse)     Past Surgical History  Procedure Laterality Date  . Hysteroscopy  01/2011  . Radial optic neurotomy      twice in lumbar area of back-every 6 months  . Colonoscopy w/ polypectomy    . Breast surgery      breast  biopsy-benign  . Dilation and curettage of uterus    . Total hip arthroplasty Left 07/17/2015    Procedure: LEFT TOTAL HIP ARTHROPLASTY ANTERIOR APPROACH;  Surgeon: Paralee Cancel, MD;  Location: [facility];  Service: Orthopedics;  Laterality: Left;  . Total hip arthroplasty Right 08/21/2015    Procedure: RIGHT TOTAL HIP ARTHROPLASTY ANTERIOR APPROACH;  Surgeon: Paralee Cancel, MD;  Location: [facility];  Service: Orthopedics;  Laterality: Right;    There were no vitals filed for this visit.  Visit Diagnosis:  Aftercare following bilateral hip joint replacement surgery  Weakness of both hips  Abnormality of gait      Subjective Assessment - 10/30/15 1450    Subjective Pt continues to notice improvement with right hip flexion. All over still notices weakness both LE and unsteady gait.   Pertinent History chronic LBP with nerve ablasion. Lt hip replacement 07/17/15, Rt hip replacment 08/21/15   Limitations Standing;Walking;Sitting   How long can you sit comfortably? Pt s/p Rt hip 08/21/2015 and Lt hip replacement 07/17/15. Pt wit degenerative disc dissease in spine, pt wearing elastic back support.     How long can you stand  comfortably? 20 minutes   How long can you walk comfortably? < 5 minutes   Patient Stated Goals improve gait, hip strength and endurance   Currently in Pain? Yes   Pain Score 3    Pain Location Hip   Pain Orientation Right;Left   Pain Descriptors / Indicators Aching;Sore   Pain Type Surgical pain   Pain Onset More than a month ago   Pain Frequency Constant   Aggravating Factors  movement, walking, standing, getting into and out of a car, transfers sit to stand   Multiple Pain Sites Yes   Pain Score 4   Pain Location Back   Pain Orientation Upper;Mid;Lower   Pain Descriptors / Indicators Constant   Pain Type Chronic pain   Pain Onset More than a month ago   Pain Frequency Constant   Aggravating Factors  walking, sitting, without back support   Pain Relieving Factors  sitting with pillow behind her back                          [facility] Adult PT Treatment/Exercise - 10/30/15 0001    Ambulation/Gait   Stairs Yes   Stairs Assistance 6: Modified independent (Device/Increase time)   Stair Management Technique Two rails   Number of Stairs 16   Height of Stairs 6   Gait Comments able to perform step over step, but inconsistent and fearful   Exercises   Exercises Lumbar;Knee/Hip;Ankle   Lumbar Exercises: Standing   Other Standing Lumbar Exercises Practiced going down on the floor and getting back up x 3, pt able to perform    Lumbar Exercises: Quadruped   Straight Leg Raise 5 reps  each leg    Knee/Hip Exercises: Aerobic   Stationary Bike L 3 x 10 min   increased to level 3 for last 3 min   Knee/Hip Exercises: Standing   Hip Flexion AROM;5 reps;Both   Other Standing Knee Exercises Half squatting with B UE support on sink  3x 10   Manual Therapy   Manual Therapy Soft tissue mobilization  adductors Rt leg   Soft tissue mobilization PROM bil hip/knee flexion   Ankle Exercises: Standing   Heel Raises 20 reps;10 reps;2 seconds  Holding at sink                PT Education - 10/30/15 1553    Education provided Yes   Education Details heel raises, suatting   Person(s) Educated Patient   Methods Explanation;Demonstration;Handout   Comprehension Verbalized understanding;Returned demonstration          PT Short Term Goals - 10/30/15 1500    PT SHORT TERM GOAL #1   Title be independent in initial HEP   Time 4   Period Weeks   Status Achieved   PT SHORT TERM GOAL #2   Title demonstrate 4/5 Rt hip strength to improve endurance   Time 4   Period Weeks   Status On-going   PT SHORT TERM GOAL #3   Title improve hip strength to ascend steps with step-over-step with use of 1 rail   Time 4   Period Weeks   Status Partially Met  able to perform in clinic with B UE support, but inconsistent   PT SHORT TERM GOAL #4   Title  walk for 15 minutes without rest   Time 4   Period Weeks   Status Achieved           PT Long  Term Goals - 10/30/15 1500    PT LONG TERM GOAL #1   Title be independent in advanced HEP   Baseline no HEP   Time 8   Period Weeks   Status On-going   PT LONG TERM GOAL #2   Title reduce FOTO to < or = to 62% limitation   Baseline 73%   Time 8   Period Weeks   Status On-going   PT LONG TERM GOAL #3   Title demonstrate 4+/5 Rt hip strength to improve endurance for standing and walking   Time 8   Period Weeks   Status On-going   PT LONG TERM GOAL #4   Title wean from walker for all distances   Time 8   Period Weeks   Status On-going   PT LONG TERM GOAL #5   Title walk for 20 minutes in the community without need to rest   Time 8   Period Weeks   Status On-going   PT LONG TERM GOAL #6   Title report < or = to 4/10 hip pain with standing for house work   Time 8   Period Weeks   Status On-going               Plan - 10/30/15 1458    Clinical Impression Statement Pt continues to improve with endurance and strength. Pt continues to have palpaple tenderness in Rt gluteal and hip area and adductors. Pt will continue to improve with Pt to address bil LE weakness and improve balance with ambulation.    Pt will benefit from skilled therapeutic intervention in order to improve on the following deficits Abnormal gait;Decreased range of motion;Difficulty walking;Pain;Decreased strength;Decreased mobility;Decreased balance;Decreased activity tolerance   Rehab Potential Good   Clinical Impairments Affecting Rehab Potential None   PT Frequency 2x / week   PT Duration 8 weeks   PT Treatment/Interventions ADLs/Self Care Home Management;Cryotherapy;Electrical Stimulation;Moist Heat;Therapeutic exercise;Therapeutic activities;Functional mobility training;Stair training;Gait training;Ultrasound;Patient/family education;Manual techniques;Passive range of motion;Neuromuscular  re-education;Balance training   PT Next Visit Plan Re-newal, continue with bil hip strength, gait, steps, STW to Rt gluteal area   PT Home Exercise Plan progress as needed   Consulted and Agree with Plan of Care Patient        Problem List Patient Active Problem List   Diagnosis Date Noted  . GERD (gastroesophageal reflux disease) 07/23/2015  . Chronic pain 07/23/2015  . Obese 07/19/2015  . S/P left THA, AA 07/17/2015  . 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 Insomnia 03/03/2011  . OSA (obstructive sleep apnea) 03/03/2011  . Depression, major, recurrent ([place]) 12/02/2010  . CONSTIPATION, SLOW TRANSIT 10/04/2010  . DIVERTICULITIS, COLON, WITH PERFORATION 08/27/2010  . ABDOMINAL PAIN, LEFT LOWER QUADRANT 08/16/2010  . BACTERIAL VAGINITIS 05/28/2010  . IRRITABLE BOWEL SYNDROME 08/14/2009  . CYSTITIS, CHRONIC INTERSTITIAL 06/27/2009  . UNSPECIFIED HYPOTHYROIDISM 05/16/2009  . LIPOMA OF OTHER SPECIFIED SITES 04/13/2009  . BRUXISM 04/13/2009  . SYNCOPE 03/13/2009  . MRI, BRAIN, ABNORMAL 02/07/2009  . MUSCLE WEAKNESS (GENERALIZED) 12/20/2008  . CERUMEN IMPACTION, BILATERAL 09/18/2008  . CANDIDIASIS OF UNSPECIFIED SITE 08/30/2008  . UNSPECIFIED ALLERGIC ALVEOLITIS AND PNEUMONITIS 08/02/2008  . HIP PAIN, LEFT, CHRONIC 07/04/2008  . MALAISE AND FATIGUE 07/04/2008  . LOW BACK PAIN 05/26/2008  . CHEST PAIN, ATYPICAL 03/02/2008  . EXTRINSIC ASTHMA, WITH EXACERBATION 02/03/2008  . DIABETES MELLITUS, TYPE II, UNCONTROLLED 01/06/2008  . PROTEINURIA 12/23/2007  . HYPERLIPIDEMIA 11/18/2007  . HYPERCALCEMIA 11/18/2007  . Essential hypertension 09/14/2007  .  ANEMIA, B12 DEFICIENCY 05/18/2007  . 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 ALLERGIC RHINITIS 03/29/2007  . FIBROMYALGIA 03/29/2007    NAUMANN-HOUEGNIFIO,Jasha Hodzic PTA 10/30/2015, 4:00 PM  [facility] [address] [place], [place], [address] Phone:  [phone]   Fax:  [phone]  Name: GENEIVE SANDSTROM MRN: [id number] Date of Birth: Sep 30, 1955

## 2015-11-01 ENCOUNTER — Ambulatory Visit: Payer: Medicaid Other | Attending: Orthopedic Surgery

## 2015-11-01 DIAGNOSIS — Z96643 Presence of artificial hip joint, bilateral: Secondary | ICD-10-CM | POA: Insufficient documentation

## 2015-11-01 DIAGNOSIS — Z471 Aftercare following joint replacement surgery: Secondary | ICD-10-CM | POA: Insufficient documentation

## 2015-11-01 DIAGNOSIS — R269 Unspecified abnormalities of gait and mobility: Secondary | ICD-10-CM | POA: Diagnosis present

## 2015-11-01 DIAGNOSIS — M6289 Other specified disorders of muscle: Secondary | ICD-10-CM | POA: Diagnosis present

## 2015-11-01 DIAGNOSIS — R29898 Other symptoms and signs involving the musculoskeletal system: Secondary | ICD-10-CM

## 2015-11-01 NOTE — Therapy (Signed)
[facility] [address], [place], [place], [address] Phone: [phone]   Fax:  [phone]  Physical Therapy Treatment  Patient Details  Name: Kristin Pope MRN: [id number] Date of Birth: 03/22/1956 Referring Provider: Paralee Cancel, MD  Encounter Date: 11/01/2015      PT End of Session - 11/01/15 1530    Visit Number 9   Date for PT Re-Evaluation 12/07/15   Authorization Type Medicaid   Authorization Time Period 16 visits 09/14/15-11/08/15   Authorization - Visit Number 9   Authorization - Number of Visits 16   PT Start Time 0277   PT Stop Time 1534   PT Time Calculation (min) 49 min   Activity Tolerance Patient tolerated treatment well   Behavior During Therapy [facility] for tasks assessed/performed      Past Medical History  Diagnosis Date  . Allergy   . Depression   . Fibromyalgia   . Hypertension   . Hyperlipidemia   . Low back pain   . Vaginitis     atropic-ongoing abnormal vaginal bleeding-had ultrasound and biopsy in last couple months  . Chronic fatigue   . Headache     constant headaches  . Arthritis     neck and spine, with bone spurs  . Torn rotator cuff     right shoulder  . Family history of adverse reaction to anesthesia     sister has problems waking up  . Reactive airway disease   . Diverticulitis 2011    HAD BOWEL PERFORATION  . Sleep apnea     does not use sleep - was told did not need cpap  . Abnormal brain MRI     per pt   . Hx: UTI (urinary tract infection)   . Anemia, pernicious   . Borderline diabetes     no meds  . Anxiety   . DDD (degenerative disc disease), cervical     ALSO LUMBAR AREA  . Hx of pyelonephritis 2007  . MVP (mitral valve prolapse)     Past Surgical History  Procedure Laterality Date  . Hysteroscopy  01/2011  . Radial optic neurotomy      twice in lumbar area of back-every 6 months  . Colonoscopy w/ polypectomy    . Breast surgery      breast  biopsy-benign  . Dilation and curettage of uterus    . Total hip arthroplasty Left 07/17/2015    Procedure: LEFT TOTAL HIP ARTHROPLASTY ANTERIOR APPROACH;  Surgeon: Paralee Cancel, MD;  Location: [facility];  Service: Orthopedics;  Laterality: Left;  . Total hip arthroplasty Right 08/21/2015    Procedure: RIGHT TOTAL HIP ARTHROPLASTY ANTERIOR APPROACH;  Surgeon: Paralee Cancel, MD;  Location: [facility];  Service: Orthopedics;  Laterality: Right;    There were no vitals filed for this visit.  Visit Diagnosis:  Aftercare following bilateral hip joint replacement surgery - Plan: PT plan of care cert/re-cert  Weakness of both hips - Plan: PT plan of care cert/re-cert  Abnormality of gait - Plan: PT plan of care cert/re-cert      Subjective Assessment - 11/01/15 1524    Subjective Pt reports that she is doing better.  Weaning from walker in the house, not walking a lot in community.     How long can you sit comfortably? Pt s/p Rt hip 08/21/2015 and Lt hip replacement 07/17/15. Pt wit degenerative disc dissease in spine, pt wearing elastic back support.     Currently in  Pain? Yes   Pain Score 4    Pain Location Hip   Pain Orientation Right;Left   Pain Descriptors / Indicators Aching;Sore   Pain Onset More than a month ago   Pain Frequency Constant   Aggravating Factors  movement, walking, standing, getting in/out of car, transfers sit to stand   Pain Relieving Factors rest, tylenol            [facility] PT Assessment - 11/01/15 0001    Assessment   Medical Diagnosis s/p bilateral total hip arhtroplasties   Onset Date/Surgical Date 08/21/15  Lt 07/17/15   [place] residence   Living Arrangements Spouse/significant other   Type of [place] to enter   Prior Function   Level of Independence Independent   Vocation On disability   Cognition   Overall Cognitive Status Within Functional Limits for tasks assessed   Observation/Other  Assessments   Focus on Therapeutic Outcomes (FOTO)  69% limitation   ROM / Strength   AROM / PROM / Strength AROM   Strength   Overall Strength Deficits   Strength Assessment Site Knee;Hip   Right Hip Flexion 4/5   Right Hip ABduction 4-/5   Left Hip Flexion 4+/5   Left Hip Extension 4+/5   Left Hip ABduction 4+/5   Right/Left Knee Right;Left   Right Knee Flexion 4+/5   Right Knee Extension 5/5   Left Knee Flexion 4+/5   Left Knee Extension 5/5                     [facility] Adult PT Treatment/Exercise - 11/01/15 0001    Lumbar Exercises: Supine   Clam 15 reps  each leg with abdominal contraction, butterfly stretch   Lumbar Exercises: Quadruped   Straight Leg Raise 10 reps  each leg    Knee/Hip Exercises: Aerobic   Stationary Bike L 3 x 10 min   increased to level 3 for last 3 min   Knee/Hip Exercises: Standing   Hip Flexion AROM;5 reps;Both   Other Standing Knee Exercises Half squatting with B UE support on sink  3x 10                  PT Short Term Goals - 10/30/15 1500    PT SHORT TERM GOAL #1   Title be independent in initial HEP   Time 4   Period Weeks   Status Achieved   PT SHORT TERM GOAL #2   Title demonstrate 4/5 Rt hip strength to improve endurance   Time 4   Period Weeks   Status On-going   PT SHORT TERM GOAL #3   Title improve hip strength to ascend steps with step-over-step with use of 1 rail   Time 4   Period Weeks   Status Partially Met  able to perform in clinic with B UE support, but inconsistent   PT SHORT TERM GOAL #4   Title walk for 15 minutes without rest   Time 4   Period Weeks   Status Achieved           PT Long Term Goals - 11/01/15 1521    PT LONG TERM GOAL #1   Title be independent in advanced HEP   Time 4   Period Weeks   Status On-going   PT LONG TERM GOAL #2   Title reduce FOTO to < or = to 62% limitation   Time 4  Period Weeks   Status On-going   PT LONG TERM GOAL #3   Title demonstrate 4+/5  Rt hip strength to improve endurance for standing and walking   Time 4   Period Weeks   Status On-going   PT LONG TERM GOAL #4   Title wean from walker for all distances   Time 4   Period Weeks   Status On-going   PT LONG TERM GOAL #5   Title walk for 20 minutes in the community without need to rest   Time 4   Period Weeks   Status On-going   PT LONG TERM GOAL #6   Title report < or = to 4/10 hip pain with standing for house work   Time 4   Period Weeks   Status On-going               Plan - 11/01/15 1525    Clinical Impression Statement Pt had total hip arthroplasty 07/17/15 and 08/21/15 (bilateral replacements)Pt has attended 8 PT sessions and has increased her endurance to standing/walking up to 15 minutes in the community.  She has weaned from walker for home distances and doesn't leave her house much to determine need in the community.  Pt ascends steps with step-to strep gait ~50% of the time due to weakness.  Pt will continue to benefit from skilled PT for LE strength and endurance progression to allow for more independence with community ambulation, endurance for household tasks and pain reduction.     Pt will benefit from skilled therapeutic intervention in order to improve on the following deficits Abnormal gait;Decreased range of motion;Difficulty walking;Pain;Decreased strength;Decreased mobility;Decreased balance;Decreased activity tolerance   Rehab Potential Good   PT Frequency 2x / week   PT Duration 4 weeks   PT Treatment/Interventions ADLs/Self Care Home Management;Cryotherapy;Electrical Stimulation;Moist Heat;Therapeutic exercise;Therapeutic activities;Functional mobility training;Stair training;Gait training;Ultrasound;Patient/family education;Manual techniques;Passive range of motion;Neuromuscular re-education;Balance training   PT Next Visit Plan Check on Medicaid visits.  2x/wk through 12/07/15.  Work on steps, LE strength, gait training, getting off of the  floor   Consulted and Agree with Plan of Care Patient        Problem List Patient Active Problem List   Diagnosis Date Noted  . GERD (gastroesophageal reflux disease) 07/23/2015  . Chronic pain 07/23/2015  . Obese 07/19/2015  . S/P left THA, AA 07/17/2015  . 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 Insomnia 03/03/2011  . OSA (obstructive sleep apnea) 03/03/2011  . Depression, major, recurrent ([place]) 12/02/2010  . CONSTIPATION, SLOW TRANSIT 10/04/2010  . DIVERTICULITIS, COLON, WITH PERFORATION 08/27/2010  . ABDOMINAL PAIN, LEFT LOWER QUADRANT 08/16/2010  . BACTERIAL VAGINITIS 05/28/2010  . IRRITABLE BOWEL SYNDROME 08/14/2009  . CYSTITIS, CHRONIC INTERSTITIAL 06/27/2009  . UNSPECIFIED HYPOTHYROIDISM 05/16/2009  . LIPOMA OF OTHER SPECIFIED SITES 04/13/2009  . BRUXISM 04/13/2009  . SYNCOPE 03/13/2009  . MRI, BRAIN, ABNORMAL 02/07/2009  . MUSCLE WEAKNESS (GENERALIZED) 12/20/2008  . CERUMEN IMPACTION, BILATERAL 09/18/2008  . CANDIDIASIS OF UNSPECIFIED SITE 08/30/2008  . UNSPECIFIED ALLERGIC ALVEOLITIS AND PNEUMONITIS 08/02/2008  . HIP PAIN, LEFT, CHRONIC 07/04/2008  . MALAISE AND FATIGUE 07/04/2008  . LOW BACK PAIN 05/26/2008  . CHEST PAIN, ATYPICAL 03/02/2008  . EXTRINSIC ASTHMA, WITH EXACERBATION 02/03/2008  . DIABETES MELLITUS, TYPE II, UNCONTROLLED 01/06/2008  . PROTEINURIA 12/23/2007  . HYPERLIPIDEMIA 11/18/2007  . HYPERCALCEMIA 11/18/2007  . Essential hypertension 09/14/2007  . ANEMIA, B12 DEFICIENCY 05/18/2007  . 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 ALLERGIC RHINITIS 03/29/2007  .  FIBROMYALGIA 03/29/2007    Adams Hinch, PT 11/01/2015, 3:35 PM  [facility] [address], [place], [place], [address] Phone: [phone]   Fax:  [phone]  Name: Kristin Pope MRN: [id number] Date of Birth: Mar 28, 1956

## 2015-11-06 ENCOUNTER — Ambulatory Visit: Payer: Medicaid Other | Admitting: Physical Therapy

## 2015-11-08 ENCOUNTER — Ambulatory Visit: Payer: Medicaid Other

## 2015-11-08 DIAGNOSIS — Z96643 Presence of artificial hip joint, bilateral: Principal | ICD-10-CM

## 2015-11-08 DIAGNOSIS — R29898 Other symptoms and signs involving the musculoskeletal system: Secondary | ICD-10-CM

## 2015-11-08 DIAGNOSIS — R269 Unspecified abnormalities of gait and mobility: Secondary | ICD-10-CM

## 2015-11-08 DIAGNOSIS — Z471 Aftercare following joint replacement surgery: Secondary | ICD-10-CM

## 2015-11-08 NOTE — Therapy (Signed)
[facility] [address] [place], [place], [address] Phone: [phone]   Fax:  [phone]  Physical Therapy Treatment  Patient Details  Name: Kristin Pope MRN: [id number] Date of Birth: 09/18/1956 Referring Provider: Paralee Cancel, MD  Encounter Date: 11/08/2015      PT End of Session - 11/08/15 1527    Visit Number 10   Date for PT Re-Evaluation 12/07/15   Authorization Type Medicaid   Authorization Time Period PT submitted for visits.   Authorization - Visit Number 9   Authorization - Number of Visits 16  1st visit-no treatment   PT Start Time 7124   PT Stop Time 1528  Pt was late for appt   PT Time Calculation (min) 35 min   Activity Tolerance Patient tolerated treatment well   Behavior During Therapy [facility] for tasks assessed/performed      Past Medical History  Diagnosis Date  . Allergy   . Depression   . Fibromyalgia   . Hypertension   . Hyperlipidemia   . Low back pain   . Vaginitis     atropic-ongoing abnormal vaginal bleeding-had ultrasound and biopsy in last couple months  . Chronic fatigue   . Headache     constant headaches  . Arthritis     neck and spine, with bone spurs  . Torn rotator cuff     right shoulder  . Family history of adverse reaction to anesthesia     sister has problems waking up  . Reactive airway disease   . Diverticulitis 2011    HAD BOWEL PERFORATION  . Sleep apnea     does not use sleep - was told did not need cpap  . Abnormal brain MRI     per pt   . Hx: UTI (urinary tract infection)   . Anemia, pernicious   . Borderline diabetes     no meds  . Anxiety   . DDD (degenerative disc disease), cervical     ALSO LUMBAR AREA  . Hx of pyelonephritis 2007  . MVP (mitral valve prolapse)     Past Surgical History  Procedure Laterality Date  . Hysteroscopy  01/2011  . Radial optic neurotomy      twice in lumbar area of back-every 6 months  . Colonoscopy w/  polypectomy    . Breast surgery      breast biopsy-benign  . Dilation and curettage of uterus    . Total hip arthroplasty Left 07/17/2015    Procedure: LEFT TOTAL HIP ARTHROPLASTY ANTERIOR APPROACH;  Surgeon: Paralee Cancel, MD;  Location: [facility];  Service: Orthopedics;  Laterality: Left;  . Total hip arthroplasty Right 08/21/2015    Procedure: RIGHT TOTAL HIP ARTHROPLASTY ANTERIOR APPROACH;  Surgeon: Paralee Cancel, MD;  Location: [facility];  Service: Orthopedics;  Laterality: Right;    There were no vitals filed for this visit.  Visit Diagnosis:  Aftercare following bilateral hip joint replacement surgery  Weakness of both hips  Abnormality of gait      Subjective Assessment - 11/08/15 1454    Subjective I haven't done anything today.  Pt missed last session due to increased pain.     How long can you sit comfortably? Pt s/p Rt hip 08/21/2015 and Lt hip replacement 07/17/15. Pt wit degenerative disc dissease in spine, pt wearing elastic back support.     Currently in Pain? Yes   Pain Score 3    Pain Location Hip   Pain  Orientation Right;Left   Pain Descriptors / Indicators Aching;Sore   Pain Type Surgical pain   Pain Onset More than a month ago   Pain Frequency Constant   Aggravating Factors  movement, walking, standing, getting in/out of car, transfers sit to stand   Pain Relieving Factors rest, Tylenol                         [facility] Adult PT Treatment/Exercise - 11/08/15 0001    Lumbar Exercises: Supine   Straight Leg Raise 20 reps   Knee/Hip Exercises: Aerobic   Stationary Bike L 2 x 10 min    Knee/Hip Exercises: Machines for Strengthening   Total Gym Leg Press 50# bil x10, 60# bil 2x10, 35# Rt only 2x10, 40# Lt only 2x10  seat 5   Knee/Hip Exercises: Standing   Forward Step Up Both;2 sets;10 reps;Hand Hold: 0;Step Height: 6"  focus on no UE support to help with function at home   Walking with Sports Cord 20# forward, 25# reverse x 10 each   Knee/Hip  Exercises: Sidelying   Clams 2x10 bil.                   PT Short Term Goals - 10/30/15 1500    PT SHORT TERM GOAL #1   Title be independent in initial HEP   Time 4   Period Weeks   Status Achieved   PT SHORT TERM GOAL #2   Title demonstrate 4/5 Rt hip strength to improve endurance   Time 4   Period Weeks   Status On-going   PT SHORT TERM GOAL #3   Title improve hip strength to ascend steps with step-over-step with use of 1 rail   Time 4   Period Weeks   Status Partially Met  able to perform in clinic with B UE support, but inconsistent   PT SHORT TERM GOAL #4   Title walk for 15 minutes without rest   Time 4   Period Weeks   Status Achieved           PT Long Term Goals - 11/08/15 1459    PT LONG TERM GOAL #1   Title be independent in advanced HEP   Time 4   Period Weeks   Status On-going   PT LONG TERM GOAL #2   Title reduce FOTO to < or = to 62% limitation   Time 4   Period Weeks   Status On-going   PT LONG TERM GOAL #3   Title demonstrate 4+/5 Rt hip strength to improve endurance for standing and walking   Time 4   Period Weeks   Status On-going   PT LONG TERM GOAL #4   Title wean from walker for all distances   Time 4   Period Weeks   Status On-going   PT LONG TERM GOAL #5   Title walk for 20 minutes in the community without need to rest   Time 4   Period Weeks   Status On-going   PT LONG TERM GOAL #6   Title report < or = to 4/10 hip pain with standing for house work   Time 4   Period Weeks   Status On-going               Plan - 11/08/15 1455    Clinical Impression Statement Pt had total hip arthroplasty 07/17/15 and 08/21/15.  Pt with increased her endurance to standing and  walking up to 15 minutes in the community.  Pt has weaned from her walker for home distances and doesn't leave her house much to determine need in the community.  Pt ascends steps with step-to gait ~50% of the time due to weakness.  Pt will continue to  benefit form skilled PT for LE strength and endurance progression to allow for more independence with community ambulation, endurance for household tasks and pain reduction.    Pt will benefit from skilled therapeutic intervention in order to improve on the following deficits Abnormal gait;Decreased range of motion;Difficulty walking;Pain;Decreased strength;Decreased mobility;Decreased balance;Decreased activity tolerance   Rehab Potential Good   PT Frequency 2x / week   PT Duration 4 weeks   PT Treatment/Interventions ADLs/Self Care Home Management;Cryotherapy;Electrical Stimulation;Moist Heat;Therapeutic exercise;Therapeutic activities;Functional mobility training;Stair training;Gait training;Ultrasound;Patient/family education;Manual techniques;Passive range of motion;Neuromuscular re-education;Balance training   PT Next Visit Plan Check on Medicaid visits.  2x/wk through 12/07/15.  Work on steps, LE strength, gait training, getting off of the floor   Consulted and Agree with Plan of Care Patient        Problem List Patient Active Problem List   Diagnosis Date Noted  . GERD (gastroesophageal reflux disease) 07/23/2015  . Chronic pain 07/23/2015  . Obese 07/19/2015  . S/P left THA, AA 07/17/2015  . 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 Insomnia 03/03/2011  . OSA (obstructive sleep apnea) 03/03/2011  . Depression, major, recurrent ([place]) 12/02/2010  . CONSTIPATION, SLOW TRANSIT 10/04/2010  . DIVERTICULITIS, COLON, WITH PERFORATION 08/27/2010  . ABDOMINAL PAIN, LEFT LOWER QUADRANT 08/16/2010  . BACTERIAL VAGINITIS 05/28/2010  . IRRITABLE BOWEL SYNDROME 08/14/2009  . CYSTITIS, CHRONIC INTERSTITIAL 06/27/2009  . UNSPECIFIED HYPOTHYROIDISM 05/16/2009  . LIPOMA OF OTHER SPECIFIED SITES 04/13/2009  . BRUXISM 04/13/2009  . SYNCOPE 03/13/2009  . MRI, BRAIN, ABNORMAL 02/07/2009  . MUSCLE WEAKNESS (GENERALIZED) 12/20/2008  . CERUMEN IMPACTION, BILATERAL 09/18/2008  . CANDIDIASIS OF  UNSPECIFIED SITE 08/30/2008  . UNSPECIFIED ALLERGIC ALVEOLITIS AND PNEUMONITIS 08/02/2008  . HIP PAIN, LEFT, CHRONIC 07/04/2008  . MALAISE AND FATIGUE 07/04/2008  . LOW BACK PAIN 05/26/2008  . CHEST PAIN, ATYPICAL 03/02/2008  . EXTRINSIC ASTHMA, WITH EXACERBATION 02/03/2008  . DIABETES MELLITUS, TYPE II, UNCONTROLLED 01/06/2008  . PROTEINURIA 12/23/2007  . HYPERLIPIDEMIA 11/18/2007  . HYPERCALCEMIA 11/18/2007  . Essential hypertension 09/14/2007  . ANEMIA, B12 DEFICIENCY 05/18/2007  . 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 ALLERGIC RHINITIS 03/29/2007  . FIBROMYALGIA 03/29/2007    Chetara Kropp, PT 11/08/2015, 3:31 PM  [facility] [address] [place], [place], [address] Phone: [phone]   Fax:  [phone]  Name: Kristin Pope MRN: [id number] Date of Birth: Mar 15, 1956

## 2015-11-28 ENCOUNTER — Encounter: Payer: Self-pay | Admitting: Physical Therapy

## 2015-11-28 ENCOUNTER — Ambulatory Visit: Payer: Medicaid Other | Attending: Orthopedic Surgery | Admitting: Physical Therapy

## 2015-11-28 DIAGNOSIS — Z471 Aftercare following joint replacement surgery: Secondary | ICD-10-CM | POA: Diagnosis present

## 2015-11-28 DIAGNOSIS — Z96643 Presence of artificial hip joint, bilateral: Secondary | ICD-10-CM | POA: Insufficient documentation

## 2015-11-28 DIAGNOSIS — R269 Unspecified abnormalities of gait and mobility: Secondary | ICD-10-CM | POA: Diagnosis present

## 2015-11-28 DIAGNOSIS — M6289 Other specified disorders of muscle: Secondary | ICD-10-CM | POA: Diagnosis present

## 2015-11-28 DIAGNOSIS — R29898 Other symptoms and signs involving the musculoskeletal system: Secondary | ICD-10-CM

## 2015-11-28 NOTE — Therapy (Signed)
[facility] [address] [place], [place], [address] Phone: [phone]   Fax:  [phone]  Physical Therapy Treatment  Patient Details  Name: Kristin Pope MRN: [id number] Date of Birth: March 16, 1956 Referring Provider: Paralee Cancel, MD  Encounter Date: 11/28/2015      PT End of Session - 11/28/15 1225    Visit Number 11   Date for PT Re-Evaluation 12/07/15   Authorization Type Medicaid   Authorization Time Period PT submitted for visits.   Authorization - Visit Number 11   Authorization - Number of Visits 16   PT Start Time 1212   PT Stop Time 1252   PT Time Calculation (min) 40 min   Activity Tolerance Patient tolerated treatment well   Behavior During Therapy WFL for tasks assessed/performed      Past Medical History  Diagnosis Date  . Allergy   . Depression   . Fibromyalgia   . Hypertension   . Hyperlipidemia   . Low back pain   . Vaginitis     atropic-ongoing abnormal vaginal bleeding-had ultrasound and biopsy in last couple months  . Chronic fatigue   . Headache     constant headaches  . Arthritis     neck and spine, with bone spurs  . Torn rotator cuff     right shoulder  . Family history of adverse reaction to anesthesia     sister has problems waking up  . Reactive airway disease   . Diverticulitis 2011    HAD BOWEL PERFORATION  . Sleep apnea     does not use sleep - was told did not need cpap  . Abnormal brain MRI     per pt   . Hx: UTI (urinary tract infection)   . Anemia, pernicious   . Borderline diabetes     no meds  . Anxiety   . DDD (degenerative disc disease), cervical     ALSO LUMBAR AREA  . Hx of pyelonephritis 2007  . MVP (mitral valve prolapse)     Past Surgical History  Procedure Laterality Date  . Hysteroscopy  01/2011  . Radial optic neurotomy      twice in lumbar area of back-every 6 months  . Colonoscopy w/ polypectomy    . Breast surgery      breast  biopsy-benign  . Dilation and curettage of uterus    . Total hip arthroplasty Left 07/17/2015    Procedure: LEFT TOTAL HIP ARTHROPLASTY ANTERIOR APPROACH;  Surgeon: Paralee Cancel, MD;  Location: [facility];  Service: Orthopedics;  Laterality: Left;  . Total hip arthroplasty Right 08/21/2015    Procedure: RIGHT TOTAL HIP ARTHROPLASTY ANTERIOR APPROACH;  Surgeon: Paralee Cancel, MD;  Location: [facility];  Service: Orthopedics;  Laterality: Right;    There were no vitals filed for this visit.  Visit Diagnosis:  Aftercare following bilateral hip joint replacement surgery  Weakness of both hips  Abnormality of gait      Subjective Assessment - 11/28/15 1227    Subjective I haven't done anything this AM so I don't feel so bad. Getting injections for neck & shoulder next week.    Currently in Pain? Yes   Pain Score 3    Pain Location Back   Pain Descriptors / Indicators Sore;Dull   Aggravating Factors  Overdoing   Pain Relieving Factors Rest, meds   Multiple Pain Sites No  [facility] Adult PT Treatment/Exercise - 11/28/15 0001    Lumbar Exercises: Stretches   Lower Trunk Rotation 5 reps   Lumbar Exercises: Supine   Straight Leg Raise 20 reps  Bil 2x10   Knee/Hip Exercises: Aerobic   Stationary Bike L 2 x 10 min    Knee/Hip Exercises: Machines for Strengthening   Total Gym Leg Press 50# bil x10, 60# bil 2x10, 35# Rt only 2x10, 40# Lt only 2x10  seat 5   Knee/Hip Exercises: Standing   Forward Step Up Both;2 sets;10 reps;Hand Hold: 0;Step Height: 6"  focus on no UE support to help with function at home   Walking with Sports Cord 25# 10 x each dir   Knee/Hip Exercises: Sidelying   Clams 2x10 bil.   TC for correct positioning                  PT Short Term Goals - 10/30/15 1500    PT SHORT TERM GOAL #1   Title be independent in initial HEP   Time 4   Period Weeks   Status Achieved   PT SHORT TERM GOAL #2   Title demonstrate 4/5 Rt hip  strength to improve endurance   Time 4   Period Weeks   Status On-going   PT SHORT TERM GOAL #3   Title improve hip strength to ascend steps with step-over-step with use of 1 rail   Time 4   Period Weeks   Status Partially Met  able to perform in clinic with B UE support, but inconsistent   PT SHORT TERM GOAL #4   Title walk for 15 minutes without rest   Time 4   Period Weeks   Status Achieved           PT Long Term Goals - 11/08/15 1459    PT LONG TERM GOAL #1   Title be independent in advanced HEP   Time 4   Period Weeks   Status On-going   PT LONG TERM GOAL #2   Title reduce FOTO to < or = to 62% limitation   Time 4   Period Weeks   Status On-going   PT LONG TERM GOAL #3   Title demonstrate 4+/5 Rt hip strength to improve endurance for standing and walking   Time 4   Period Weeks   Status On-going   PT LONG TERM GOAL #4   Title wean from walker for all distances   Time 4   Period Weeks   Status On-going   PT LONG TERM GOAL #5   Title walk for 20 minutes in the community without need to rest   Time 4   Period Weeks   Status On-going   PT LONG TERM GOAL #6   Title report < or = to 4/10 hip pain with standing for house work   Time 4   Period Weeks   Status On-going               Plan - 11/28/15 1253    Clinical Impression Statement Pt doing pretty well today. She contributes this to taking it easy this morning. She did all exercises without pain, but needed alot of tactile cuing during her sidelying exercises for positioning.    Pt will benefit from skilled therapeutic intervention in order to improve on the following deficits Abnormal gait;Decreased range of motion;Difficulty walking;Pain;Decreased strength;Decreased mobility;Decreased balance;Decreased activity tolerance   Rehab Potential Good   PT Frequency 2x / week  PT Treatment/Interventions ADLs/Self Care Home Management;Cryotherapy;Electrical Stimulation;Moist Heat;Therapeutic  exercise;Therapeutic activities;Functional mobility training;Stair training;Gait training;Ultrasound;Patient/family education;Manual techniques;Passive range of motion;Neuromuscular re-education;Balance training   PT Next Visit Plan Prepare pt for HEP transition   Consulted and Agree with Plan of Care Patient        Problem List Patient Active Problem List   Diagnosis Date Noted  . GERD (gastroesophageal reflux disease) 07/23/2015  . Chronic pain 07/23/2015  . Obese 07/19/2015  . S/P left THA, AA 07/17/2015  . 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 Insomnia 03/03/2011  . OSA (obstructive sleep apnea) 03/03/2011  . Depression, major, recurrent ([place]) 12/02/2010  . CONSTIPATION, SLOW TRANSIT 10/04/2010  . DIVERTICULITIS, COLON, WITH PERFORATION 08/27/2010  . ABDOMINAL PAIN, LEFT LOWER QUADRANT 08/16/2010  . BACTERIAL VAGINITIS 05/28/2010  . IRRITABLE BOWEL SYNDROME 08/14/2009  . CYSTITIS, CHRONIC INTERSTITIAL 06/27/2009  . UNSPECIFIED HYPOTHYROIDISM 05/16/2009  . LIPOMA OF OTHER SPECIFIED SITES 04/13/2009  . BRUXISM 04/13/2009  . SYNCOPE 03/13/2009  . MRI, BRAIN, ABNORMAL 02/07/2009  . MUSCLE WEAKNESS (GENERALIZED) 12/20/2008  . CERUMEN IMPACTION, BILATERAL 09/18/2008  . CANDIDIASIS OF UNSPECIFIED SITE 08/30/2008  . UNSPECIFIED ALLERGIC ALVEOLITIS AND PNEUMONITIS 08/02/2008  . HIP PAIN, LEFT, CHRONIC 07/04/2008  . MALAISE AND FATIGUE 07/04/2008  . LOW BACK PAIN 05/26/2008  . CHEST PAIN, ATYPICAL 03/02/2008  . EXTRINSIC ASTHMA, WITH EXACERBATION 02/03/2008  . DIABETES MELLITUS, TYPE II, UNCONTROLLED 01/06/2008  . PROTEINURIA 12/23/2007  . HYPERLIPIDEMIA 11/18/2007  . HYPERCALCEMIA 11/18/2007  . Essential hypertension 09/14/2007  . ANEMIA, B12 DEFICIENCY 05/18/2007  . 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 ALLERGIC RHINITIS 03/29/2007  . FIBROMYALGIA 03/29/2007    Shadeed Colberg, PTA 11/28/2015, 12:56 PM  [facility] [address], [place], [place], [address] Phone: [phone]   Fax:  [phone]  Name: Kristin Pope MRN: [id number] Date of Birth: 1956-04-23

## 2015-11-29 ENCOUNTER — Ambulatory Visit: Payer: Medicaid Other | Admitting: Physical Therapy

## 2015-11-29 ENCOUNTER — Encounter: Payer: Self-pay | Admitting: Physical Therapy

## 2015-11-29 DIAGNOSIS — Z96643 Presence of artificial hip joint, bilateral: Principal | ICD-10-CM

## 2015-11-29 DIAGNOSIS — Z471 Aftercare following joint replacement surgery: Secondary | ICD-10-CM | POA: Diagnosis not present

## 2015-11-29 DIAGNOSIS — R269 Unspecified abnormalities of gait and mobility: Secondary | ICD-10-CM

## 2015-11-29 DIAGNOSIS — R29898 Other symptoms and signs involving the musculoskeletal system: Secondary | ICD-10-CM

## 2015-11-29 NOTE — Therapy (Signed)
[facility] [address] [place], [place], [address] Phone: [phone]   Fax:  [phone]  Physical Therapy Treatment  Patient Details  Name: Kristin Pope MRN: [id number] Date of Birth: September 07, 1956 Referring Provider: Paralee Cancel, MD  Encounter Date: 11/29/2015      PT End of Session - 11/29/15 1529    Visit Number 12   Date for PT Re-Evaluation 12/07/15   Authorization Type Medicaid   Authorization Time Period PT submitted for visits.   Authorization - Visit Number 12   Authorization - Number of Visits 16   PT Start Time 5597   PT Stop Time 1527   PT Time Calculation (min) 49 min   Activity Tolerance Patient tolerated treatment well   Behavior During Therapy WFL for tasks assessed/performed      Past Medical History  Diagnosis Date  . Allergy   . Depression   . Fibromyalgia   . Hypertension   . Hyperlipidemia   . Low back pain   . Vaginitis     atropic-ongoing abnormal vaginal bleeding-had ultrasound and biopsy in last couple months  . Chronic fatigue   . Headache     constant headaches  . Arthritis     neck and spine, with bone spurs  . Torn rotator cuff     right shoulder  . Family history of adverse reaction to anesthesia     sister has problems waking up  . Reactive airway disease   . Diverticulitis 2011    HAD BOWEL PERFORATION  . Sleep apnea     does not use sleep - was told did not need cpap  . Abnormal brain MRI     per pt   . Hx: UTI (urinary tract infection)   . Anemia, pernicious   . Borderline diabetes     no meds  . Anxiety   . DDD (degenerative disc disease), cervical     ALSO LUMBAR AREA  . Hx of pyelonephritis 2007  . MVP (mitral valve prolapse)     Past Surgical History  Procedure Laterality Date  . Hysteroscopy  01/2011  . Radial optic neurotomy      twice in lumbar area of back-every 6 months  . Colonoscopy w/ polypectomy    . Breast surgery      breast  biopsy-benign  . Dilation and curettage of uterus    . Total hip arthroplasty Left 07/17/2015    Procedure: LEFT TOTAL HIP ARTHROPLASTY ANTERIOR APPROACH;  Surgeon: Paralee Cancel, MD;  Location: [facility];  Service: Orthopedics;  Laterality: Left;  . Total hip arthroplasty Right 08/21/2015    Procedure: RIGHT TOTAL HIP ARTHROPLASTY ANTERIOR APPROACH;  Surgeon: Paralee Cancel, MD;  Location: [facility];  Service: Orthopedics;  Laterality: Right;    There were no vitals filed for this visit.  Visit Diagnosis:  Aftercare following bilateral hip joint replacement surgery  Weakness of both hips  Abnormality of gait      Subjective Assessment - 11/29/15 1450    Subjective Pt is getting injections for neck & shoulder next week. Pain in overall in back is rated as 3-4/10 and tenderness and tightness in Rt hip/glueal area   Pertinent History chronic LBP with nerve ablasion. Lt hip replacement 07/17/15, Rt hip replacment 08/21/15   Limitations Standing;Walking;Sitting   How long can you sit comfortably? Pt s/p Rt hip 08/21/2015 and Lt hip replacement 07/17/15. Pt wit degenerative disc dissease in spine, pt wearing elastic back support.  How long can you stand comfortably? 20 minutes   How long can you walk comfortably? < 5 minutes   Patient Stated Goals improve gait, hip strength and endurance   Currently in Pain? Yes   Pain Score 3    Pain Location Back   Pain Orientation Right;Left   Pain Descriptors / Indicators Sore;Dull   Pain Type Chronic pain   Pain Onset More than a month ago   Pain Frequency Constant   Aggravating Factors  walking, standing,  sitting without back support,    Pain Relieving Factors pillow in back   Multiple Pain Sites No                         [facility] Adult PT Treatment/Exercise - 11/29/15 0001    Exercises   Exercises Lumbar;Knee/Hip;Ankle   Lumbar Exercises: Supine   Bridge 20 reps;2 seconds  with ball squezzes   Straight Leg Raise --  2 x 20    Knee/Hip Exercises: Aerobic   Stationary Bike L 2 x 11 min    Nustep 83mn Level 2   Knee/Hip Exercises: Machines for Strengthening   Total Gym Leg Press 65# 3x10, 35# Rt only 3x10, 40# Lt only 3x10   Knee/Hip Exercises: Standing   Forward Step Up Both;2 sets;10 reps;Hand Hold: 0;Step Height: 6"   Knee/Hip Exercises: Sidelying   Clams 2x20 bil.    Manual Therapy   Manual Therapy Soft tissue mobilization  in Rt sidelying, along ITB and gluteal area/piriformis   Soft tissue mobilization --                  PT Short Term Goals - 10/30/15 1500    PT SHORT TERM GOAL #1   Title be independent in initial HEP   Time 4   Period Weeks   Status Achieved   PT SHORT TERM GOAL #2   Title demonstrate 4/5 Rt hip strength to improve endurance   Time 4   Period Weeks   Status On-going   PT SHORT TERM GOAL #3   Title improve hip strength to ascend steps with step-over-step with use of 1 rail   Time 4   Period Weeks   Status Partially Met  able to perform in clinic with B UE support, but inconsistent   PT SHORT TERM GOAL #4   Title walk for 15 minutes without rest   Time 4   Period Weeks   Status Achieved           PT Long Term Goals - 11/29/15 1505    PT LONG TERM GOAL #1   Title be independent in advanced HEP   Baseline no HEP   Time 4   Period Weeks   Status On-going   PT LONG TERM GOAL #2   Title reduce FOTO to < or = to 62% limitation   Time 4   Period Weeks   Status On-going   PT LONG TERM GOAL #3   Title demonstrate 4+/5 Rt hip strength to improve endurance for standing and walking   Time 4   Period Weeks   Status On-going   PT LONG TERM GOAL #4   Title wean from walker for all distances   Time 4   Period Weeks   Status Achieved   PT LONG TERM GOAL #5   Title walk for 20 minutes in the community without need to rest   Time 4   Period Weeks  Status Achieved   PT LONG TERM GOAL #6   Title report < or = to 4/10 hip pain with standing for house work    Time 4   Period Weeks   Status On-going               Plan - 11/29/15 1526    Clinical Impression Statement Pt is doing well today. Pt is going fast thru her exercises and needs vc's for pacing. Pt will be D/C next week .    Pt will benefit from skilled therapeutic intervention in order to improve on the following deficits Abnormal gait;Decreased range of motion;Difficulty walking;Pain;Decreased strength;Decreased mobility;Decreased balance;Decreased activity tolerance   Rehab Potential Good   Clinical Impairments Affecting Rehab Potential None   PT Frequency 2x / week   PT Duration 4 weeks   PT Treatment/Interventions ADLs/Self Care Home Management;Cryotherapy;Electrical Stimulation;Moist Heat;Therapeutic exercise;Therapeutic activities;Functional mobility training;Stair training;Gait training;Ultrasound;Patient/family education;Manual techniques;Passive range of motion;Neuromuscular re-education;Balance training   PT Next Visit Plan Prepare pt for HEP transition, D/C next week   PT Home Exercise Plan progress as needed   Consulted and Agree with Plan of Care Patient        Problem List Patient Active Problem List   Diagnosis Date Noted  . GERD (gastroesophageal reflux disease) 07/23/2015  . Chronic pain 07/23/2015  . Obese 07/19/2015  . S/P left THA, AA 07/17/2015  . 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 Insomnia 03/03/2011  . OSA (obstructive sleep apnea) 03/03/2011  . Depression, major, recurrent ([place]) 12/02/2010  . CONSTIPATION, SLOW TRANSIT 10/04/2010  . DIVERTICULITIS, COLON, WITH PERFORATION 08/27/2010  . ABDOMINAL PAIN, LEFT LOWER QUADRANT 08/16/2010  . BACTERIAL VAGINITIS 05/28/2010  . IRRITABLE BOWEL SYNDROME 08/14/2009  . CYSTITIS, CHRONIC INTERSTITIAL 06/27/2009  . UNSPECIFIED HYPOTHYROIDISM 05/16/2009  . LIPOMA OF OTHER SPECIFIED SITES 04/13/2009  . BRUXISM 04/13/2009  . SYNCOPE 03/13/2009  . MRI, BRAIN, ABNORMAL 02/07/2009  . MUSCLE WEAKNESS  (GENERALIZED) 12/20/2008  . CERUMEN IMPACTION, BILATERAL 09/18/2008  . CANDIDIASIS OF UNSPECIFIED SITE 08/30/2008  . UNSPECIFIED ALLERGIC ALVEOLITIS AND PNEUMONITIS 08/02/2008  . HIP PAIN, LEFT, CHRONIC 07/04/2008  . MALAISE AND FATIGUE 07/04/2008  . LOW BACK PAIN 05/26/2008  . CHEST PAIN, ATYPICAL 03/02/2008  . EXTRINSIC ASTHMA, WITH EXACERBATION 02/03/2008  . DIABETES MELLITUS, TYPE II, UNCONTROLLED 01/06/2008  . PROTEINURIA 12/23/2007  . HYPERLIPIDEMIA 11/18/2007  . HYPERCALCEMIA 11/18/2007  . Essential hypertension 09/14/2007  . ANEMIA, B12 DEFICIENCY 05/18/2007  . 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 ALLERGIC RHINITIS 03/29/2007  . FIBROMYALGIA 03/29/2007    NAUMANN-HOUEGNIFIO,Artez Regis PTA 11/29/2015, 3:30 PM  [facility] [address] [place], [place], [address] Phone: [phone]   Fax:  [phone]  Name: Kristin Pope MRN: [id number] Date of Birth: Apr 25, 1956

## 2015-12-03 ENCOUNTER — Encounter: Payer: Self-pay | Admitting: Physical Therapy

## 2015-12-03 ENCOUNTER — Ambulatory Visit: Payer: Medicaid Other | Admitting: Physical Therapy

## 2015-12-03 DIAGNOSIS — Z96643 Presence of artificial hip joint, bilateral: Principal | ICD-10-CM

## 2015-12-03 DIAGNOSIS — R29898 Other symptoms and signs involving the musculoskeletal system: Secondary | ICD-10-CM

## 2015-12-03 DIAGNOSIS — Z471 Aftercare following joint replacement surgery: Secondary | ICD-10-CM

## 2015-12-03 DIAGNOSIS — R269 Unspecified abnormalities of gait and mobility: Secondary | ICD-10-CM

## 2015-12-03 NOTE — Therapy (Signed)
[facility] [address], [place], [place], [address] Phone: [phone]   Fax:  [phone]  Physical Therapy Treatment  Patient Details  Name: Kristin Pope MRN: [id number] Date of Birth: Aug 10, 1956 Referring Provider: Paralee Cancel, MD  Encounter Date: 12/03/2015      PT End of Session - 12/03/15 1528    Visit Number 13   Date for PT Re-Evaluation 12/07/15   Authorization Type Medicaid   Authorization Time Period PT submitted for visits.   Authorization - Visit Number 13   Authorization - Number of Visits 16   PT Start Time 4709   PT Stop Time 1524   PT Time Calculation (min) 48 min   Activity Tolerance Patient tolerated treatment well   Behavior During Therapy WFL for tasks assessed/performed      Past Medical History  Diagnosis Date  . Allergy   . Depression   . Fibromyalgia   . Hypertension   . Hyperlipidemia   . Low back pain   . Vaginitis     atropic-ongoing abnormal vaginal bleeding-had ultrasound and biopsy in last couple months  . Chronic fatigue   . Headache     constant headaches  . Arthritis     neck and spine, with bone spurs  . Torn rotator cuff     right shoulder  . Family history of adverse reaction to anesthesia     sister has problems waking up  . Reactive airway disease   . Diverticulitis 2011    HAD BOWEL PERFORATION  . Sleep apnea     does not use sleep - was told did not need cpap  . Abnormal brain MRI     per pt   . Hx: UTI (urinary tract infection)   . Anemia, pernicious   . Borderline diabetes     no meds  . Anxiety   . DDD (degenerative disc disease), cervical     ALSO LUMBAR AREA  . Hx of pyelonephritis 2007  . MVP (mitral valve prolapse)     Past Surgical History  Procedure Laterality Date  . Hysteroscopy  01/2011  . Radial optic neurotomy      twice in lumbar area of back-every 6 months  . Colonoscopy w/ polypectomy    . Breast surgery      breast  biopsy-benign  . Dilation and curettage of uterus    . Total hip arthroplasty Left 07/17/2015    Procedure: LEFT TOTAL HIP ARTHROPLASTY ANTERIOR APPROACH;  Surgeon: Paralee Cancel, MD;  Location: [facility];  Service: Orthopedics;  Laterality: Left;  . Total hip arthroplasty Right 08/21/2015    Procedure: RIGHT TOTAL HIP ARTHROPLASTY ANTERIOR APPROACH;  Surgeon: Paralee Cancel, MD;  Location: [facility];  Service: Orthopedics;  Laterality: Right;    There were no vitals filed for this visit.  Visit Diagnosis:  Aftercare following bilateral hip joint replacement surgery  Weakness of both hips  Abnormality of gait      Subjective Assessment - 12/03/15 1455    Subjective Pt is getting injections for neck & shoulder this week. Pain in low back is rated a s3/10 and tenderness and tighness in Rt hip/gluteal area.   Pertinent History chronic LBP with nerve ablasion. Lt hip replacement 07/17/15, Rt hip replacment 08/21/15   Limitations Standing;Walking;Sitting   How long can you sit comfortably? Pt s/p Rt hip 08/21/2015 and Lt hip replacement 07/17/15. Pt wit degenerative disc dissease in spine, pt wearing elastic back support.  How long can you stand comfortably? 20 minutes   How long can you walk comfortably? < 5 minutes   Patient Stated Goals improve gait, hip strength and endurance   Currently in Pain? Yes   Pain Score 3    Pain Location Back   Pain Orientation Right;Left   Pain Descriptors / Indicators Dull;Sore   Pain Type Chronic pain   Pain Onset More than a month ago   Pain Frequency Constant   Aggravating Factors  walking, standing, sitting without back support   Pain Relieving Factors pillow in back   Multiple Pain Sites No                         [facility] Adult PT Treatment/Exercise - 12/03/15 0001    Exercises   Exercises Lumbar;Knee/Hip;Ankle   Lumbar Exercises: Supine   Bridge 20 reps;2 seconds  x 2 with ball b/w knees   Knee/Hip Exercises: Aerobic    Stationary Bike L 3 x 30mn    Knee/Hip Exercises: Machines for Strengthening   Total Gym Leg Press 70# 3x10, 35# Rt only 3x10, 40# Lt only 3x10   Manual Therapy   Manual Therapy Soft tissue mobilization  to Lt/Rt gluteal/piri & hip area in left and right Sidelying                  PT Short Term Goals - 10/30/15 1500    PT SHORT TERM GOAL #1   Title be independent in initial HEP   Time 4   Period Weeks   Status Achieved   PT SHORT TERM GOAL #2   Title demonstrate 4/5 Rt hip strength to improve endurance   Time 4   Period Weeks   Status On-going   PT SHORT TERM GOAL #3   Title improve hip strength to ascend steps with step-over-step with use of 1 rail   Time 4   Period Weeks   Status Partially Met  able to perform in clinic with B UE support, but inconsistent   PT SHORT TERM GOAL #4   Title walk for 15 minutes without rest   Time 4   Period Weeks   Status Achieved           PT Long Term Goals - 11/29/15 1505    PT LONG TERM GOAL #1   Title be independent in advanced HEP   Baseline no HEP   Time 4   Period Weeks   Status On-going   PT LONG TERM GOAL #2   Title reduce FOTO to < or = to 62% limitation   Time 4   Period Weeks   Status On-going   PT LONG TERM GOAL #3   Title demonstrate 4+/5 Rt hip strength to improve endurance for standing and walking   Time 4   Period Weeks   Status On-going   PT LONG TERM GOAL #4   Title wean from walker for all distances   Time 4   Period Weeks   Status Achieved   PT LONG TERM GOAL #5   Title walk for 20 minutes in the community without need to rest   Time 4   Period Weeks   Status Achieved   PT LONG TERM GOAL #6   Title report < or = to 4/10 hip pain with standing for house work   Time 4   Period Weeks   Status On-going  Plan - 12/03/15 1529    Clinical Impression Statement Pt has a good day and able to tolerate activities on bike and legpress well. Pt will be D/C next visit    Pt  will benefit from skilled therapeutic intervention in order to improve on the following deficits Abnormal gait;Decreased range of motion;Difficulty walking;Pain;Decreased strength;Decreased mobility;Decreased balance;Decreased activity tolerance   Rehab Potential Good   Clinical Impairments Affecting Rehab Potential None   PT Frequency 2x / week   PT Duration 4 weeks   PT Treatment/Interventions ADLs/Self Care Home Management;Cryotherapy;Electrical Stimulation;Moist Heat;Therapeutic exercise;Therapeutic activities;Functional mobility training;Stair training;Gait training;Ultrasound;Patient/family education;Manual techniques;Passive range of motion;Neuromuscular re-education;Balance training   PT Next Visit Plan D/C next visit.   PT Home Exercise Plan progress as needed   Consulted and Agree with Plan of Care Patient        Problem List Patient Active Problem List   Diagnosis Date Noted  . GERD (gastroesophageal reflux disease) 07/23/2015  . Chronic pain 07/23/2015  . Obese 07/19/2015  . S/P left THA, AA 07/17/2015  . 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 Insomnia 03/03/2011  . OSA (obstructive sleep apnea) 03/03/2011  . Depression, major, recurrent ([place]) 12/02/2010  . CONSTIPATION, SLOW TRANSIT 10/04/2010  . DIVERTICULITIS, COLON, WITH PERFORATION 08/27/2010  . ABDOMINAL PAIN, LEFT LOWER QUADRANT 08/16/2010  . BACTERIAL VAGINITIS 05/28/2010  . IRRITABLE BOWEL SYNDROME 08/14/2009  . CYSTITIS, CHRONIC INTERSTITIAL 06/27/2009  . UNSPECIFIED HYPOTHYROIDISM 05/16/2009  . LIPOMA OF OTHER SPECIFIED SITES 04/13/2009  . BRUXISM 04/13/2009  . SYNCOPE 03/13/2009  . MRI, BRAIN, ABNORMAL 02/07/2009  . MUSCLE WEAKNESS (GENERALIZED) 12/20/2008  . CERUMEN IMPACTION, BILATERAL 09/18/2008  . CANDIDIASIS OF UNSPECIFIED SITE 08/30/2008  . UNSPECIFIED ALLERGIC ALVEOLITIS AND PNEUMONITIS 08/02/2008  . HIP PAIN, LEFT, CHRONIC 07/04/2008  . MALAISE AND FATIGUE 07/04/2008  . LOW BACK PAIN  05/26/2008  . CHEST PAIN, ATYPICAL 03/02/2008  . EXTRINSIC ASTHMA, WITH EXACERBATION 02/03/2008  . DIABETES MELLITUS, TYPE II, UNCONTROLLED 01/06/2008  . PROTEINURIA 12/23/2007  . HYPERLIPIDEMIA 11/18/2007  . HYPERCALCEMIA 11/18/2007  . Essential hypertension 09/14/2007  . ANEMIA, B12 DEFICIENCY 05/18/2007  . 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 ALLERGIC RHINITIS 03/29/2007  . FIBROMYALGIA 03/29/2007    NAUMANN-HOUEGNIFIO,Paysley Poplar PTA 12/03/2015, 3:35 PM  [facility] [address] [place], [place], [address] Phone: [phone]   Fax:  [phone]  Name: Kristin Pope MRN: [id number] Date of Birth: 06-16-1956

## 2015-12-06 ENCOUNTER — Ambulatory Visit: Payer: Medicaid Other

## 2015-12-06 DIAGNOSIS — Z96643 Presence of artificial hip joint, bilateral: Principal | ICD-10-CM

## 2015-12-06 DIAGNOSIS — R269 Unspecified abnormalities of gait and mobility: Secondary | ICD-10-CM

## 2015-12-06 DIAGNOSIS — R29898 Other symptoms and signs involving the musculoskeletal system: Secondary | ICD-10-CM

## 2015-12-06 DIAGNOSIS — Z471 Aftercare following joint replacement surgery: Secondary | ICD-10-CM

## 2015-12-06 NOTE — Therapy (Signed)
[facility] [address], [address] [place], [place], [address] Phone: [phone]   Fax:  [phone]  Physical Therapy Treatment  Patient Details  Name: Kristin Pope MRN: [id number] Date of Birth: 08/11/1956 Referring Provider: Olin, Matthew, MD  Encounter Date: 12/06/2015      PT End of Session - 12/06/15 1539    Visit Number 14   PT Start Time 1440   PT Stop Time 1539   PT Time Calculation (min) 59 min   Activity Tolerance Patient tolerated treatment well   Behavior During Therapy WFL for tasks assessed/performed      Past Medical History  Diagnosis Date  . Allergy   . Depression   . Fibromyalgia   . Hypertension   . Hyperlipidemia   . Low back pain   . Vaginitis     atropic-ongoing abnormal vaginal bleeding-had ultrasound and biopsy in last couple months  . Chronic fatigue   . Headache     constant headaches  . Arthritis     neck and spine, with bone spurs  . Torn rotator cuff     right shoulder  . Family history of adverse reaction to anesthesia     sister has problems waking up  . Reactive airway disease   . Diverticulitis 2011    HAD BOWEL PERFORATION  . Sleep apnea     does not use sleep - was told did not need cpap  . Abnormal brain MRI     per pt   . Hx: UTI (urinary tract infection)   . Anemia, pernicious   . Borderline diabetes     no meds  . Anxiety   . DDD (degenerative disc disease), cervical     ALSO LUMBAR AREA  . Hx of pyelonephritis 2007  . MVP (mitral valve prolapse)     Past Surgical History  Procedure Laterality Date  . Hysteroscopy  01/2011  . Radial optic neurotomy      twice in lumbar area of back-every 6 months  . Colonoscopy w/ polypectomy    . Breast surgery      breast biopsy-benign  . Dilation and curettage of uterus    . Total hip arthroplasty Left 07/17/2015    Procedure: LEFT TOTAL HIP ARTHROPLASTY ANTERIOR APPROACH;  Surgeon: Matthew Olin, MD;  Location: [facility];  Service: Orthopedics;  Laterality: Left;  . Total hip arthroplasty Right 08/21/2015    Procedure: RIGHT TOTAL HIP ARTHROPLASTY ANTERIOR APPROACH;  Surgeon: Matthew Olin, MD;  Location: [facility];  Service: Orthopedics;  Laterality: Right;    There were no vitals filed for this visit.  Visit Diagnosis:  Aftercare following bilateral hip joint replacement surgery  Weakness of both hips  Abnormality of gait      Subjective Assessment - 12/06/15 1444    Subjective Pt will be discharged today to HEP for continued gains.   How long can you sit comfortably? Pt s/p Rt hip 08/21/2015 and Lt hip replacement 07/17/15. Pt wit degenerative disc dissease in spine, pt wearing elastic back support.     Patient Stated Goals improve gait, hip strength and endurance   Currently in Pain? Yes   Pain Score 4    Pain Location Hip   Pain Orientation Right;Left   Pain Descriptors / Indicators Sore;Dull   Pain Type Chronic pain   Pain Onset More than a month ago   Pain Frequency Constant   Aggravating Factors  walking, standing, activity   Pain Relieving   Factors sitting, rest            [facility] PT Assessment - 12/06/15 0001    Assessment   Medical Diagnosis s/p bilateral total hip arhtroplasties   Onset Date/Surgical Date 08/21/15  Lt 07/17/15   Observation/Other Assessments   Focus on Therapeutic Outcomes (FOTO)  69% limitation   Strength   Overall Strength Deficits   Strength Assessment Site Hip;Knee   Right Hip Flexion 4/5   Right Hip Extension 4/5   Right Hip ABduction 4/5   Left Hip Flexion 4+/5   Left Hip Extension 4+/5   Left Hip ABduction 4+/5   Right/Left Knee Right;Left   Right Knee Flexion 4+/5   Right Knee Extension 5/5   Left Knee Flexion 4+/5   Left Knee Extension 5/5                     [facility] Adult PT Treatment/Exercise - 12/06/15 0001    Knee/Hip Exercises: Aerobic   Stationary Bike L 3 x 10 min    Knee/Hip Exercises: Machines for Strengthening    Total Gym Leg Press 70# 3x10, 50# Rt only 3x10, 55# Lt only 3x10  seat 5   Knee/Hip Exercises: Standing   Hip Abduction Stengthening;Both;2 sets;10 reps   Hip Extension Stengthening;Both;2 sets;10 reps   Forward Step Up Both;2 sets;10 reps;Hand Hold: 0;Step Height: 6"                PT Education - 12/06/15 1536    Education provided Yes   Education Details HEP: bridge, standing hip, clam    Person(s) Educated Patient   Methods Explanation;Demonstration;Handout   Comprehension Verbalized understanding;Returned demonstration          PT Short Term Goals - 12/06/15 1450    PT SHORT TERM GOAL #2   Title demonstrate 4/5 Rt hip strength to improve endurance   Status Achieved   PT SHORT TERM GOAL #3   Title improve hip strength to ascend steps with step-over-step with use of 1 rail   Status Achieved           PT Long Term Goals - 12/06/15 1454    PT LONG TERM GOAL #1   Title be independent in advanced HEP   Status Achieved   PT LONG TERM GOAL #2   Title reduce FOTO to < or = to 62% limitation   Status Partially Met  69% limitation   PT LONG TERM GOAL #3   Title demonstrate 4+/5 Rt hip strength to improve endurance for standing and walking   Status Partially Met   PT LONG TERM GOAL #4   Title wean from walker for all distances   Status Achieved  no device   PT LONG TERM GOAL #5   Title walk for 20 minutes in the community without need to rest   Status Partially Met  Pt not able to determine distance.  She reports fatigue with all walking   PT LONG TERM GOAL #6   Title report < or = to 4/10 hip pain with standing for house work   Status Achieved               Plan - 12/06/15 1511    Clinical Impression Statement Pt will be discharged to HEP.  Strength in hips and knees have improved since the start of care.  Pt reports minimal compliance with HEP and PT focused session on verbal and demo review of HEP for continued strength gains.  Pt is able to ascend  and descend steps normally with use of rail when not carring objects.  Pt demonstrates reduced gait velocity and is able to ambulate all distances without device.  Pt reports fatigue that limits most distance ambulation.     PT Next Visit Plan D/C PT   Consulted and Agree with Plan of Care Patient        Problem List Patient Active Problem List   Diagnosis Date Noted  . GERD (gastroesophageal reflux disease) 07/23/2015  . Chronic pain 07/23/2015  . Obese 07/19/2015  . S/P left THA, AA 07/17/2015  . 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 Insomnia 03/03/2011  . OSA (obstructive sleep apnea) 03/03/2011  . Depression, major, recurrent ([place]) 12/02/2010  . CONSTIPATION, SLOW TRANSIT 10/04/2010  . DIVERTICULITIS, COLON, WITH PERFORATION 08/27/2010  . ABDOMINAL PAIN, LEFT LOWER QUADRANT 08/16/2010  . BACTERIAL VAGINITIS 05/28/2010  . IRRITABLE BOWEL SYNDROME 08/14/2009  . CYSTITIS, CHRONIC INTERSTITIAL 06/27/2009  . UNSPECIFIED HYPOTHYROIDISM 05/16/2009  . LIPOMA OF OTHER SPECIFIED SITES 04/13/2009  . BRUXISM 04/13/2009  . SYNCOPE 03/13/2009  . MRI, BRAIN, ABNORMAL 02/07/2009  . MUSCLE WEAKNESS (GENERALIZED) 12/20/2008  . CERUMEN IMPACTION, BILATERAL 09/18/2008  . CANDIDIASIS OF UNSPECIFIED SITE 08/30/2008  . UNSPECIFIED ALLERGIC ALVEOLITIS AND PNEUMONITIS 08/02/2008  . HIP PAIN, LEFT, CHRONIC 07/04/2008  . MALAISE AND FATIGUE 07/04/2008  . LOW BACK PAIN 05/26/2008  . CHEST PAIN, ATYPICAL 03/02/2008  . EXTRINSIC ASTHMA, WITH EXACERBATION 02/03/2008  . DIABETES MELLITUS, TYPE II, UNCONTROLLED 01/06/2008  . PROTEINURIA 12/23/2007  . HYPERLIPIDEMIA 11/18/2007  . HYPERCALCEMIA 11/18/2007  . Essential hypertension 09/14/2007  . ANEMIA, B12 DEFICIENCY 05/18/2007  . 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 ALLERGIC RHINITIS 03/29/2007  . FIBROMYALGIA 03/29/2007  PHYSICAL THERAPY DISCHARGE SUMMARY  Visits from Start of Care: 14  Current functional level related  to goals / functional outcomes: See above for current status.  Pt with functional weakness and limited endurance for standing and walking.     Remaining deficits: See above.     Education / Equipment: HEP Plan: Patient agrees to discharge.  Patient goals were partially met. Patient is being discharged due to being pleased with the current functional level.  ?????     Sigurd Sos, PT 12/06/2015 3:40 PM  [facility] [address] [place], [place], [address] Phone: [phone]   Fax:  [phone]  Name: Kristin Pope MRN: [id number] Date of Birth: Sep 26, 1956

## 2015-12-06 NOTE — Patient Instructions (Addendum)
    ABDUCTION: Standing (Active)   Stand, feet flat. Lift right leg out to side. Use _0__ lbs. Complete __10_ repetitions. Perform __2_ sessions per day.  Marland Kitchen  EXTENSION: Standing (Active)  Stand, both feet flat. Draw right leg behind body as far as possible. Use 0___ lbs. Complete 10 repetitions. Perform __2_ sessions per day.  Copyright  VHI. All rights reserved.   Mini-Squats (Standing)    Stand with support. Bend knees slightly.Hold for 3___ seconds. Return to straight standing. Relax for _3 seconds. Repeat 10___ times. Do _2__ times a day.  Copyright  VHI. All rights reserved.   Abduction: Clam (Eccentric) - Side-Lying    Lie on side with knees bent. Lift top knee, keeping feet together. Keep trunk steady. Slowly lower for 3-5 seconds. _10__ reps per set, 2___ sets per day, ___ days per week.  Anterior Step-Up    Stand with ___ inch step placed in A direction. Step onto step with right foot without pushing off with the ground foot. Finish with ground foot in touch balance on step and return ___ times. ___ sets ___ times per day.  http://gglj.exer.us/161   Copyright  VHI. All rights reserved.  Bridge    Lie back, legs bent. Inhale, pressing hips up. Keeping ribs in, lengthen lower back. Exhale, rolling down along spine from top. Ball between knees/ Repeat2x 10____ times. Do _2___ sessions per day.  http://pm.exer.us/55   Copyright  VHI. All rights reserved.    [place] [place] [address] [place], [place] [address] Phone # [phone] Fax [phone]

## 2015-12-28 ENCOUNTER — Emergency Department (HOSPITAL_COMMUNITY): Payer: Medicaid Other

## 2015-12-28 ENCOUNTER — Observation Stay (HOSPITAL_COMMUNITY)
Admission: EM | Admit: 2015-12-28 | Discharge: 2015-12-30 | Disposition: A | Discharge status: Home / Self Care | Payer: Medicaid Other | Attending: Internal Medicine | Admitting: Internal Medicine

## 2015-12-28 ENCOUNTER — Encounter (HOSPITAL_COMMUNITY): Payer: Self-pay

## 2015-12-28 DIAGNOSIS — Z823 Family history of stroke: Secondary | ICD-10-CM | POA: Insufficient documentation

## 2015-12-28 DIAGNOSIS — I679 Cerebrovascular disease, unspecified: Secondary | ICD-10-CM | POA: Diagnosis present

## 2015-12-28 DIAGNOSIS — G8929 Other chronic pain: Secondary | ICD-10-CM | POA: Diagnosis not present

## 2015-12-28 DIAGNOSIS — R200 Anesthesia of skin: Secondary | ICD-10-CM | POA: Diagnosis not present

## 2015-12-28 DIAGNOSIS — I10 Essential (primary) hypertension: Secondary | ICD-10-CM | POA: Diagnosis not present

## 2015-12-28 DIAGNOSIS — Z87891 Personal history of nicotine dependence: Secondary | ICD-10-CM | POA: Insufficient documentation

## 2015-12-28 DIAGNOSIS — F419 Anxiety disorder, unspecified: Secondary | ICD-10-CM | POA: Insufficient documentation

## 2015-12-28 DIAGNOSIS — Z8673 Personal history of transient ischemic attack (TIA), and cerebral infarction without residual deficits: Secondary | ICD-10-CM | POA: Diagnosis not present

## 2015-12-28 DIAGNOSIS — E785 Hyperlipidemia, unspecified: Secondary | ICD-10-CM | POA: Diagnosis not present

## 2015-12-28 DIAGNOSIS — I272 Pulmonary hypertension, unspecified: Secondary | ICD-10-CM | POA: Diagnosis present

## 2015-12-28 DIAGNOSIS — G458 Other transient cerebral ischemic attacks and related syndromes: Secondary | ICD-10-CM | POA: Diagnosis not present

## 2015-12-28 DIAGNOSIS — R262 Difficulty in walking, not elsewhere classified: Secondary | ICD-10-CM | POA: Insufficient documentation

## 2015-12-28 DIAGNOSIS — G459 Transient cerebral ischemic attack, unspecified: Secondary | ICD-10-CM | POA: Diagnosis not present

## 2015-12-28 DIAGNOSIS — H539 Unspecified visual disturbance: Secondary | ICD-10-CM | POA: Diagnosis not present

## 2015-12-28 DIAGNOSIS — R9089 Other abnormal findings on diagnostic imaging of central nervous system: Secondary | ICD-10-CM | POA: Diagnosis present

## 2015-12-28 HISTORY — DX: Personal history of transient ischemic attack (TIA), and cerebral infarction without residual deficits: Z86.73

## 2015-12-28 HISTORY — DX: Transient cerebral ischemic attack, unspecified: G45.9

## 2015-12-28 HISTORY — DX: Major depressive disorder, recurrent, unspecified: F33.9

## 2015-12-28 HISTORY — DX: Gastro-esophageal reflux disease without esophagitis: K21.9

## 2015-12-28 LAB — CBC
HEMATOCRIT: 40.2 % (ref 36.0–46.0)
Hemoglobin: 12.9 g/dL (ref 12.0–15.0)
MCH: 30.6 pg (ref 26.0–34.0)
MCHC: 32.1 g/dL (ref 30.0–36.0)
MCV: 95.3 fL (ref 78.0–100.0)
Platelets: 363 10*3/uL (ref 150–400)
RBC: 4.22 MIL/uL (ref 3.87–5.11)
RDW: 14.4 % (ref 11.5–15.5)
WBC: 7.8 10*3/uL (ref 4.0–10.5)

## 2015-12-28 LAB — I-STAT CHEM 8, ED
BUN: 20 mg/dL (ref 6–20)
Calcium, Ion: 1.23 mmol/L (ref 1.13–1.30)
Chloride: 100 mmol/L — ABNORMAL LOW (ref 101–111)
Creatinine, Ser: 0.8 mg/dL (ref 0.44–1.00)
GLUCOSE: 99 mg/dL (ref 65–99)
HEMATOCRIT: 44 % (ref 36.0–46.0)
HEMOGLOBIN: 15 g/dL (ref 12.0–15.0)
POTASSIUM: 4.6 mmol/L (ref 3.5–5.1)
Sodium: 141 mmol/L (ref 135–145)
TCO2: 29 mmol/L (ref 0–100)

## 2015-12-28 LAB — COMPREHENSIVE METABOLIC PANEL
ALT: 14 U/L (ref 14–54)
AST: 20 U/L (ref 15–41)
Albumin: 4.7 g/dL (ref 3.5–5.0)
Alkaline Phosphatase: 86 U/L (ref 38–126)
Anion gap: 11 (ref 5–15)
BUN: 15 mg/dL (ref 6–20)
CHLORIDE: 102 mmol/L (ref 101–111)
CO2: 28 mmol/L (ref 22–32)
Calcium: 10.1 mg/dL (ref 8.9–10.3)
Creatinine, Ser: 0.87 mg/dL (ref 0.44–1.00)
Glucose, Bld: 104 mg/dL — ABNORMAL HIGH (ref 65–99)
POTASSIUM: 4.5 mmol/L (ref 3.5–5.1)
Sodium: 141 mmol/L (ref 135–145)
Total Bilirubin: 0.6 mg/dL (ref 0.3–1.2)
Total Protein: 7.4 g/dL (ref 6.5–8.1)

## 2015-12-28 LAB — DIFFERENTIAL
BASOS PCT: 0 %
Basophils Absolute: 0 10*3/uL (ref 0.0–0.1)
EOS ABS: 0.1 10*3/uL (ref 0.0–0.7)
EOS PCT: 2 %
Lymphocytes Relative: 38 %
Lymphs Abs: 2.9 10*3/uL (ref 0.7–4.0)
MONO ABS: 0.4 10*3/uL (ref 0.1–1.0)
MONOS PCT: 5 %
Neutro Abs: 4.3 10*3/uL (ref 1.7–7.7)
Neutrophils Relative %: 55 %

## 2015-12-28 LAB — PROTIME-INR
INR: 0.96 (ref 0.00–1.49)
Prothrombin Time: 13 seconds (ref 11.6–15.2)

## 2015-12-28 LAB — APTT: APTT: 27 s (ref 24–37)

## 2015-12-28 LAB — I-STAT TROPONIN, ED: TROPONIN I, POC: 0 ng/mL (ref 0.00–0.08)

## 2015-12-28 MED ORDER — GADOBENATE DIMEGLUMINE 529 MG/ML IV SOLN
15.0000 mL | Freq: Once | INTRAVENOUS | Status: AC | PRN
  Administered 2015-12-28: 15 mL via INTRAVENOUS

## 2015-12-28 NOTE — ED Provider Notes (Signed)
CSN: [id number]     Arrival date & time 12/28/15  1607 History   First MD Initiated Contact with Patient 12/28/15 1726     No chief complaint on file.    (Consider location/radiation/quality/duration/timing/severity/associated sxs/prior Treatment) HPI   She is a 60 year old female with extensive past medical history including fibromyalgia hypertension hyperlipidemia chronic back pain chronic fatigue torn rotator cuff and anxiety. She is presenting with several episodes this week of difficulty with her vision. Patient is difficult historian. However she reports that she had her eyes dilated by ophthalmology and then noted to have some blurriness in her vision. Patient also noted that earlier in the week at one point she had a headache and right arm numbness.  All the symptoms have since resolved. Patient was sent here by ophthalmologist Dr. Manuella Ghazi to make sure patient did not have TIA. Ophthalmologically she is cleared.  I discussed case with Dr. Manuella Ghazi. Patient's symptoms are inconsistent, variable on retelling.  Past Medical History  Diagnosis Date  . Allergy   . Depression   . Fibromyalgia   . Hypertension   . Hyperlipidemia   . Low back pain   . Vaginitis     atropic-ongoing abnormal vaginal bleeding-had ultrasound and biopsy in last couple months  . Chronic fatigue   . Headache     constant headaches  . Arthritis     neck and spine, with bone spurs  . Torn rotator cuff     right shoulder  . Family history of adverse reaction to anesthesia     sister has problems waking up  . Reactive airway disease   . Diverticulitis 2011    HAD BOWEL PERFORATION  . Sleep apnea     does not use sleep - was told did not need cpap  . Abnormal brain MRI     per pt   . Hx: UTI (urinary tract infection)   . Anemia, pernicious   . Borderline diabetes     no meds  . Anxiety   . DDD (degenerative disc disease), cervical     ALSO LUMBAR AREA  . Hx of pyelonephritis 2007  . MVP (mitral valve  prolapse)    Past Surgical History  Procedure Laterality Date  . Hysteroscopy  01/2011  . Radial optic neurotomy      twice in lumbar area of back-every 6 months  . Colonoscopy w/ polypectomy    . Breast surgery      breast biopsy-benign  . Dilation and curettage of uterus    . Total hip arthroplasty Left 07/17/2015    Procedure: LEFT TOTAL HIP ARTHROPLASTY ANTERIOR APPROACH;  Surgeon: Paralee Cancel, MD;  Location: [facility];  Service: Orthopedics;  Laterality: Left;  . Total hip arthroplasty Right 08/21/2015    Procedure: RIGHT TOTAL HIP ARTHROPLASTY ANTERIOR APPROACH;  Surgeon: Paralee Cancel, MD;  Location: [facility];  Service: Orthopedics;  Laterality: Right;   Family History  Problem Relation Age of Onset  . Coronary artery disease Brother   . Heart disease Mother   . Heart attack Mother   . Breast cancer Sister   . Lung cancer Sister   . Breast cancer Maternal Aunt   . Breast cancer Maternal Grandmother   . Stroke Maternal Grandfather   . Brain cancer Paternal Grandfather   . Asthma Mother   . Lung cancer Paternal Grandfather   . Rheum arthritis Maternal Grandmother    Social History  Substance Use Topics  . Smoking status: Former Smoker  Quit date: 09/29/1984  . Smokeless tobacco: None  . Alcohol Use: Yes     Comment: wine occassionally   OB History    No data available     Review of Systems  Constitutional: Negative for activity change.  Respiratory: Negative for shortness of breath.   Cardiovascular: Negative for chest pain.  Gastrointestinal: Negative for abdominal pain.  Genitourinary: Negative for dysuria.  Neurological: Positive for weakness, numbness and headaches.  Psychiatric/Behavioral: The patient is nervous/anxious.       Allergies  Morphine and related; Codeine; Fentanyl; Gluten meal; Glutethimides; Lactose intolerance (gi); Lipitor; Monosodium glutamate; Tetracycline; Tramadol; and Hydrocodone-acetaminophen  Home Medications   Prior to  Admission medications   Medication Sig Start Date End Date Taking? Authorizing Provider  acetaminophen (TYLENOL) 650 MG CR tablet Take 1,300 mg by mouth 4 (four) times daily as needed for pain. Tylenol Arthritis   Yes Historical Provider, MD  celecoxib (CELEBREX) 100 MG capsule Take 100 mg by mouth daily as needed (leg pain).   Yes Historical Provider, MD  cholecalciferol (VITAMIN D) 1000 UNITS tablet Take 1,000 Units by mouth daily with lunch.    Yes Historical Provider, MD  clonazePAM (KLONOPIN) 1 MG tablet Take 1 tablet (1 mg total) by mouth 4 (four) times daily. Patient taking differently: Take 1 mg by mouth 4 (four) times daily as needed for anxiety.  07/19/15  Yes Danae Orleans, PA-C  conjugated estrogens (PREMARIN) vaginal cream Place 1 Applicatorful vaginally See admin instructions. Use up to twice weekly as needed to thicken vaginal wall   Yes Historical Provider, MD  cyanocobalamin (,VITAMIN B-12,) 1000 MCG/ML injection Inject 1,000 mcg into the muscle See admin instructions. Vitamin B12 - last injection approx 12/07/15 - done at Dr. Shanon Brow Talbot's office ([place], [place]) every 3 or 4 months   Yes Historical Provider, MD  Cyanocobalamin (VITAMIN B-12) 5000 MCG TBDP Take 5,000 mcg by mouth daily with lunch.   Yes Historical Provider, MD  doxepin (SINEQUAN) 50 MG capsule Take 50 mg by mouth at bedtime.    Yes Historical Provider, MD  DULoxetine (CYMBALTA) 60 MG capsule Take 60 mg by mouth at bedtime.  07/28/15  Yes Historical Provider, MD  fluticasone (FLONASE) 50 MCG/ACT nasal spray Place 1 spray into both nostrils daily. 12/07/15  Yes Historical Provider, MD  furosemide (LASIX) 20 MG tablet Take 20 mg by mouth every Monday, Wednesday, and Friday.   Yes Historical Provider, MD  GuaiFENesin (MUCINEX PO) Take 1 tablet by mouth 2 (two) times daily as needed (congestion from seasonal allergies).   Yes Historical Provider, MD  hydrocortisone cream 1 % Apply 1 application topically at  bedtime as needed for itching.    Yes Historical Provider, MD  HYDROmorphone (DILAUDID) 2 MG tablet Take 1-2 tablets (2-4 mg total) by mouth every 4 (four) hours as needed for severe pain. Patient taking differently: Take 2 mg by mouth daily as needed for severe pain.  08/22/15  Yes Matthew Babish, PA-C  lubiprostone (AMITIZA) 24 MCG capsule Take 1 capsule (24 mcg total) by mouth daily with breakfast. Patient taking differently: Take 24 mcg by mouth at bedtime.  01/20/12  Yes Ricard Dillon, MD  montelukast (SINGULAIR) 10 MG tablet Take 10 mg by mouth at bedtime.    Yes Historical Provider, MD  nadolol (CORGARD) 40 MG tablet TAKE 1 TABLET EVERY DAY Patient taking differently: TAKE 1 TABLET EVERY DAY AT BEDTIME 12/17/12  Yes Ricard Dillon, MD  nitrofurantoin, macrocrystal-monohydrate, (MACROBID) 100 MG  capsule Take 100 mg by mouth at bedtime. Continuous course 12/07/15  Yes Historical Provider, MD  NUVIGIL 150 MG tablet TAKE 1 TABLET BY MOUTH EVERY DAY Patient taking differently: TAKE 1/2 TO 1 TABLET BY MOUTH 2 TIMES DAILY AS NEEDED FOR ENERGY BOOST 07/15/12  Yes Ricard Dillon, MD  omeprazole (PRILOSEC) 40 MG capsule Take 40 mg by mouth at bedtime.    Yes Historical Provider, MD  potassium chloride (KLOR-CON) 8 MEQ tablet 1 tablet daily Patient taking differently: Take 1 tablet daily at bedtime 04/04/13  Yes Ricard Dillon, MD  TESTOSTERONE IM Inject 1 application into the muscle See admin instructions. Every 2 1/2 months at Dr. Stann Mainland at [place] office - last injection approx 1st week of March   Yes Historical Provider, MD  tiZANidine (ZANAFLEX) 4 MG tablet Take 4 mg by mouth 4 (four) times daily as needed (pain).  12/02/15  Yes Historical Provider, MD  zolpidem (AMBIEN) 10 MG tablet Take 10 mg by mouth at bedtime.    Yes Historical Provider, MD  acetaminophen (TYLENOL) 500 MG tablet Take 2 tablets (1,000 mg total) by mouth every 8 (eight) hours. Patient not taking: Reported on 12/28/2015  08/22/15   Danae Orleans, PA-C  docusate sodium (COLACE) 100 MG capsule Take 1 capsule (100 mg total) by mouth 2 (two) times daily. Patient not taking: Reported on 09/11/2015 08/22/15   Danae Orleans, PA-C  estazolam (PROSOM) 2 MG tablet Take 1 tablet (2 mg total) by mouth at bedtime. Patient not taking: Reported on 12/28/2015 01/20/12   Ricard Dillon, MD  ferrous sulfate 325 (65 FE) MG tablet Take 1 tablet (325 mg total) by mouth 3 (three) times daily after meals. Patient not taking: Reported on 09/11/2015 08/22/15   Danae Orleans, PA-C  methocarbamol (ROBAXIN) 500 MG tablet Take 1 tablet (500 mg total) by mouth every 6 (six) hours as needed for muscle spasms. Patient not taking: Reported on 12/28/2015 08/22/15   Danae Orleans, PA-C  modafinil (PROVIGIL) 200 MG tablet Take 1 tablet (200 mg total) by mouth daily as needed (Fatigue). Patient not taking: Reported on 12/28/2015 08/24/15   Arlee Muslim, PA-C  polyethylene glycol [facility] / [facility]aveling) packet Take 17 g by mouth 2 (two) times daily. Patient not taking: Reported on 09/11/2015 08/22/15   Danae Orleans, PA-C   BP 141/66 mmHg  Pulse 52  Temp(Src) 98.5 F (36.9 C) (Oral)  Resp 15  Ht 4\' 10"  (1.473 m)  Wt 165 lb 4 oz (74.957 kg)  BMI 34.55 kg/m2  SpO2 99% Physical Exam  Constitutional: She is oriented to person, place, and time. She appears well-developed and well-nourished.  HENT:  Head: Normocephalic and atraumatic.  Eyes: Conjunctivae are normal. Right eye exhibits no discharge.  Neck: Neck supple.  Cardiovascular: Normal rate, regular rhythm and normal heart sounds.   No murmur heard. Pulmonary/Chest: Effort normal and breath sounds normal. She has no wheezes. She has no rales.  Abdominal: Soft. She exhibits no distension. There is no tenderness.  Musculoskeletal: Normal range of motion. She exhibits no edema.  Neurological: She is oriented to person, place, and time. No cranial nerve deficit.  No neurologic deficits.  Pronator drift negative. Steady ambulation. Cranial nerves II through XII intact. Patient's eyes dilated.  Skin: Skin is warm and dry. No rash noted. She is not diaphoretic.  Psychiatric: She has a normal mood and affect. Her behavior is normal.  Nursing note and vitals reviewed.   ED Course  Procedures (  including critical care time) Labs Review Labs Reviewed  COMPREHENSIVE METABOLIC PANEL - Abnormal; Notable for the following:    Glucose, Bld 104 (*)    All other components within normal limits  I-STAT CHEM 8, ED - Abnormal; Notable for the following:    Chloride 100 (*)    All other components within normal limits  PROTIME-INR  APTT  CBC  DIFFERENTIAL  I-STAT TROPOININ, ED  CBG MONITORING, ED    Imaging Review Ct Head Wo Contrast  12/28/2015  CLINICAL DATA:  Blurred vision, headache, right arm numbness EXAM: CT HEAD WITHOUT CONTRAST TECHNIQUE: Contiguous axial images were obtained from the base of the skull through the vertex without intravenous contrast. COMPARISON:  11/08/2010 FINDINGS: No evidence of parenchymal hemorrhage or extra-axial fluid collection. No mass lesion, mass effect, or midline shift. No CT evidence of acute infarction. Subcortical white matter and periventricular small vessel ischemic changes. Global cortical atrophy.  No ventriculomegaly. The visualized paranasal sinuses are essentially clear. The mastoid air cells are unopacified. No evidence of calvarial fracture. IMPRESSION: No evidence of acute intracranial abnormality. Atrophy with small vessel ischemic changes. Electronically Signed   By: Julian Hy M.D.   On: 12/28/2015 16:59   Mr Angiogram Head Wo Contrast  12/28/2015  CLINICAL DATA:  TIA. Right arm numbness. Several episodes of visual disturbance this past week. All symptoms have since resolved. Patch that EXAM: MRI HEAD WITHOUT AND WITH CONTRAST MRA HEAD WITHOUT CONTRAST MRA NECK WITHOUT AND WITH CONTRAST TECHNIQUE: Multiplanar, multiecho pulse  sequences of the brain and surrounding structures were obtained without and with intravenous contrast. Angiographic images of the Circle of Willis were obtained using MRA technique without intravenous contrast. Angiographic images of the neck were obtained using MRA technique without and with intravenous contrast. Carotid stenosis measurements (when applicable) are obtained utilizing NASCET criteria, using the distal internal carotid diameter as the denominator. CONTRAST:  10mL MULTIHANCE GADOBENATE DIMEGLUMINE 529 MG/ML IV SOLN COMPARISON:  Head CT 12/28/2015 and MRI 02/06/2009. FINDINGS: MRI HEAD FINDINGS There is no evidence of acute infarct, intracranial hemorrhage, mass, or midline shift. Prominent CSF spaces over the anterior aspects of both frontal lobes have slightly increased from the prior MRI and are favored to reflect atrophy over subdural collections. A few small foci of T2 hyperintensity in the cerebral white matter and a small focus in the pons are nonspecific but may reflect minimal chronic small vessel ischemic disease, not greater than expected for patient's age. There is a small, chronic superior left cerebellar infarct which is new from the prior MRI. No abnormal enhancement is identified. Orbits are unremarkable. There are trace mastoid effusions. The paranasal sinuses are clear. Major intracranial vascular flow voids are preserved. MRA HEAD FINDINGS The visualized distal vertebral arteries are patent and codominant. PICA origins are patent. AICA and SCA origins are grossly patent but suboptimally evaluated due to their small size. Basilar artery is patent without stenosis. There is a small left posterior communicating artery. PCAs are patent without evidence of significant proximal stenosis. Internal carotid arteries are patent from skullbase to carotid termini without stenosis there is likely a small infundibulum at the left posterior communicating artery origin. ACAs and MCAs are patent  without evidence of major branch occlusion or significant stenosis. MRA NECK FINDINGS Three vessel aortic arch. Brachiocephalic and subclavian arteries are patent. Both proximal common carotid arteries are tortuous and suboptimally evaluated on both contrast enhanced and noncontrast time-of-flight sequences. The more distal common carotid arteries are widely patent. The internal  carotid arteries are patent without stenosis. There is mild luminal irregularity/slight beading of the mid cervical ICA the right. The vertebral arteries are patent and codominant with antegrade flow bilaterally. No significant vertebral artery stenosis is identified, although there is mild luminal regularity involving the right V3 segment. IMPRESSION: 1. No acute intracranial abnormality. 2. Chronic left cerebellar infarct, new from 2010. 3. No major intracranial arterial occlusion or significant stenosis. 4. No significant arterial stenosis identified in the neck. Suboptimal evaluation of the proximal common carotid arteries. 5. Mild luminal regularity involving the right cervical ICA and right vertebral artery V3 segment, query mild fibromuscular dysplasia. Electronically Signed   By: Logan Bores M.D.   On: 12/28/2015 21:18   Mr Angiogram Neck W Wo Contrast  12/28/2015  CLINICAL DATA:  TIA. Right arm numbness. Several episodes of visual disturbance this past week. All symptoms have since resolved. Patch that EXAM: MRI HEAD WITHOUT AND WITH CONTRAST MRA HEAD WITHOUT CONTRAST MRA NECK WITHOUT AND WITH CONTRAST TECHNIQUE: Multiplanar, multiecho pulse sequences of the brain and surrounding structures were obtained without and with intravenous contrast. Angiographic images of the Circle of Willis were obtained using MRA technique without intravenous contrast. Angiographic images of the neck were obtained using MRA technique without and with intravenous contrast. Carotid stenosis measurements (when applicable) are obtained utilizing NASCET  criteria, using the distal internal carotid diameter as the denominator. CONTRAST:  64mL MULTIHANCE GADOBENATE DIMEGLUMINE 529 MG/ML IV SOLN COMPARISON:  Head CT 12/28/2015 and MRI 02/06/2009. FINDINGS: MRI HEAD FINDINGS There is no evidence of acute infarct, intracranial hemorrhage, mass, or midline shift. Prominent CSF spaces over the anterior aspects of both frontal lobes have slightly increased from the prior MRI and are favored to reflect atrophy over subdural collections. A few small foci of T2 hyperintensity in the cerebral white matter and a small focus in the pons are nonspecific but may reflect minimal chronic small vessel ischemic disease, not greater than expected for patient's age. There is a small, chronic superior left cerebellar infarct which is new from the prior MRI. No abnormal enhancement is identified. Orbits are unremarkable. There are trace mastoid effusions. The paranasal sinuses are clear. Major intracranial vascular flow voids are preserved. MRA HEAD FINDINGS The visualized distal vertebral arteries are patent and codominant. PICA origins are patent. AICA and SCA origins are grossly patent but suboptimally evaluated due to their small size. Basilar artery is patent without stenosis. There is a small left posterior communicating artery. PCAs are patent without evidence of significant proximal stenosis. Internal carotid arteries are patent from skullbase to carotid termini without stenosis there is likely a small infundibulum at the left posterior communicating artery origin. ACAs and MCAs are patent without evidence of major branch occlusion or significant stenosis. MRA NECK FINDINGS Three vessel aortic arch. Brachiocephalic and subclavian arteries are patent. Both proximal common carotid arteries are tortuous and suboptimally evaluated on both contrast enhanced and noncontrast time-of-flight sequences. The more distal common carotid arteries are widely patent. The internal carotid arteries  are patent without stenosis. There is mild luminal irregularity/slight beading of the mid cervical ICA the right. The vertebral arteries are patent and codominant with antegrade flow bilaterally. No significant vertebral artery stenosis is identified, although there is mild luminal regularity involving the right V3 segment. IMPRESSION: 1. No acute intracranial abnormality. 2. Chronic left cerebellar infarct, new from 2010. 3. No major intracranial arterial occlusion or significant stenosis. 4. No significant arterial stenosis identified in the neck. Suboptimal evaluation of the proximal  common carotid arteries. 5. Mild luminal regularity involving the right cervical ICA and right vertebral artery V3 segment, query mild fibromuscular dysplasia. Electronically Signed   By: Logan Bores M.D.   On: 12/28/2015 21:18   Mr Jeri Cos [id number] Contrast  12/28/2015  CLINICAL DATA:  TIA. Right arm numbness. Several episodes of visual disturbance this past week. All symptoms have since resolved. Patch that EXAM: MRI HEAD WITHOUT AND WITH CONTRAST MRA HEAD WITHOUT CONTRAST MRA NECK WITHOUT AND WITH CONTRAST TECHNIQUE: Multiplanar, multiecho pulse sequences of the brain and surrounding structures were obtained without and with intravenous contrast. Angiographic images of the Circle of Willis were obtained using MRA technique without intravenous contrast. Angiographic images of the neck were obtained using MRA technique without and with intravenous contrast. Carotid stenosis measurements (when applicable) are obtained utilizing NASCET criteria, using the distal internal carotid diameter as the denominator. CONTRAST:  31mL MULTIHANCE GADOBENATE DIMEGLUMINE 529 MG/ML IV SOLN COMPARISON:  Head CT 12/28/2015 and MRI 02/06/2009. FINDINGS: MRI HEAD FINDINGS There is no evidence of acute infarct, intracranial hemorrhage, mass, or midline shift. Prominent CSF spaces over the anterior aspects of both frontal lobes have slightly increased from  the prior MRI and are favored to reflect atrophy over subdural collections. A few small foci of T2 hyperintensity in the cerebral white matter and a small focus in the pons are nonspecific but may reflect minimal chronic small vessel ischemic disease, not greater than expected for patient's age. There is a small, chronic superior left cerebellar infarct which is new from the prior MRI. No abnormal enhancement is identified. Orbits are unremarkable. There are trace mastoid effusions. The paranasal sinuses are clear. Major intracranial vascular flow voids are preserved. MRA HEAD FINDINGS The visualized distal vertebral arteries are patent and codominant. PICA origins are patent. AICA and SCA origins are grossly patent but suboptimally evaluated due to their small size. Basilar artery is patent without stenosis. There is a small left posterior communicating artery. PCAs are patent without evidence of significant proximal stenosis. Internal carotid arteries are patent from skullbase to carotid termini without stenosis there is likely a small infundibulum at the left posterior communicating artery origin. ACAs and MCAs are patent without evidence of major branch occlusion or significant stenosis. MRA NECK FINDINGS Three vessel aortic arch. Brachiocephalic and subclavian arteries are patent. Both proximal common carotid arteries are tortuous and suboptimally evaluated on both contrast enhanced and noncontrast time-of-flight sequences. The more distal common carotid arteries are widely patent. The internal carotid arteries are patent without stenosis. There is mild luminal irregularity/slight beading of the mid cervical ICA the right. The vertebral arteries are patent and codominant with antegrade flow bilaterally. No significant vertebral artery stenosis is identified, although there is mild luminal regularity involving the right V3 segment. IMPRESSION: 1. No acute intracranial abnormality. 2. Chronic left cerebellar  infarct, new from 2010. 3. No major intracranial arterial occlusion or significant stenosis. 4. No significant arterial stenosis identified in the neck. Suboptimal evaluation of the proximal common carotid arteries. 5. Mild luminal regularity involving the right cervical ICA and right vertebral artery V3 segment, query mild fibromuscular dysplasia. Electronically Signed   By: Logan Bores M.D.   On: 12/28/2015 21:18   I have personally reviewed and evaluated these images and lab results as part of my medical decision-making.   EKG Interpretation None      MDM   Final diagnoses:  None    Patient's 60 year old female with past medical history significant for fibromyalgia anxiety  hypertension hyperlipidemia. She is presenting today with a couple of intermittent symptoms over the last week. One of which was blurred vision while having her eyes dilated. The other was right arm numbness. Ophthalmologist sent her here to be evaluated for TIA.  CAT scan normal. We'll get MRI.   Will discuss with neurology.  9:32 PM Neurology reccs admission for TIA work up.  Kaelon Weekes Julio Alm, MD 12/28/15 2132

## 2015-12-28 NOTE — ED Notes (Signed)
Pt to MRI

## 2015-12-28 NOTE — ED Notes (Signed)
Pt sent here by her opthomologist for what he thinks are tia's.  Wed she experienced blurred vision (both eyes) x 30 min and yesterday frontal headache and R arm numbness (pt is very poor historian).  No neuro deficits at this time.

## 2015-12-28 NOTE — ED Notes (Signed)
Pt not on floor, in MRI

## 2015-12-28 NOTE — Consult Note (Signed)
Neurology Consultation Reason for Consult: Numbness Referring Physician: Thomasene Lot, C  CC: Visual changes  History is obtained from: Patient  HPI: Kristin Pope is a 60 y.o. female history of multiple medical problems who presents with several complaints. The first is visual changes which she describes as noticing that she can't see her hand in an area of her vision that she is typically able to see it. This has happened twice, the first time lasted at least 45 minutes and then she went to bed. The second happened today shortly after having her eyes dilated at the ophthalmologist and lasted approximately 30 minutes.  She also complains of intermittent right arm numbness. She states that when this happens it occurs suddenly, there is no progression of symptoms, no paresthesia, and no association with headache. This has happened 4 times over the past month.  She does have chronic headaches. She denies a history of migraines, however on further history she used to get headaches in the 80s which were associated with photophobia. More recently over the past year and a half she has been having increasing severity in her headaches. She does have a headache today with photophobia.   ROS: A 14 point ROS was performed and is negative except as noted in the HPI.   Past Medical History  Diagnosis Date  . Allergy   . Depression   . Fibromyalgia   . Hypertension   . Hyperlipidemia   . Low back pain   . Vaginitis     atropic-ongoing abnormal vaginal bleeding-had ultrasound and biopsy in last couple months  . Chronic fatigue   . Headache     constant headaches  . Arthritis     neck and spine, with bone spurs  . Torn rotator cuff     right shoulder  . Family history of adverse reaction to anesthesia     sister has problems waking up  . Reactive airway disease   . Diverticulitis 2011    HAD BOWEL PERFORATION  . Sleep apnea     does not use sleep - was told did not need cpap  . Abnormal brain  MRI     per pt   . Hx: UTI (urinary tract infection)   . Anemia, pernicious   . Borderline diabetes     no meds  . Anxiety   . DDD (degenerative disc disease), cervical     ALSO LUMBAR AREA  . Hx of pyelonephritis 2007  . MVP (mitral valve prolapse)      Family History  Problem Relation Age of Onset  . Coronary artery disease Brother   . Heart disease Mother   . Heart attack Mother   . Breast cancer Sister   . Lung cancer Sister   . Breast cancer Maternal Aunt   . Breast cancer Maternal Grandmother   . Stroke Maternal Grandfather   . Brain cancer Paternal Grandfather   . Asthma Mother   . Lung cancer Paternal Grandfather   . Rheum arthritis Maternal Grandmother      Social History:  reports that she quit smoking about 31 years ago. She does not have any smokeless tobacco history on file. She reports that she drinks alcohol. She reports that she uses illicit drugs (Marijuana).   Exam: Current vital signs: BP 152/81 mmHg  Pulse 103  Temp(Src) 98.3 F (36.8 C) (Oral)  Resp 13  Ht 4\' 10"  (1.473 m)  Wt 74.957 kg (165 lb 4 oz)  BMI 34.55 kg/m2  SpO2  97% Vital signs in last 24 hours: Temp:  [98.3 F (36.8 C)-98.5 F (36.9 C)] 98.3 F (36.8 C) (03/31 2148) Pulse Rate:  [50-103] 103 (03/31 2224) Resp:  [11-16] 13 (03/31 2224) BP: (135-164)/(66-86) 152/81 mmHg (03/31 2224) SpO2:  [97 %-100 %] 97 % (03/31 2224) Weight:  [74.957 kg (165 lb 4 oz)] 74.957 kg (165 lb 4 oz) (03/31 1610)   Physical Exam  Constitutional: Appears well-developed and well-nourished.  Psych: Affect appropriate to situation Eyes: No scleral injection HENT: No OP obstrucion Head: Normocephalic.  Cardiovascular: Normal rate and regular rhythm.  Respiratory: Effort normal and breath sounds normal to anterior ascultation GI: Soft.  No distension. There is no tenderness.  Skin: WDI  Neuro: Mental Status: Patient is awake, alert, oriented to person, place, month, year, and situation. Patient  is able to give a clear and coherent history. No signs of aphasia or neglect Cranial Nerves: II: Visual Fields are full. Pupils are equal, round, and reactive to light.   III,IV, VI: EOMI without ptosis or diploplia.  V: Facial sensation is symmetric to temperature VII: Facial movement is symmetric.  VIII: hearing is intact to voice X: Uvula elevates symmetrically XI: Shoulder shrug is symmetric. XII: tongue is midline without atrophy or fasciculations.  Motor: Tone is normal. Bulk is normal. 5/5 strength was present in all four extremities.  Sensory: Sensation is symmetric to light touch and temperature in the arms and legs. Cerebellar: FNF are intact bilaterally  I have reviewed labs in epic and the results pertinent to this consultation are: CMP-unremarkable  I have reviewed the images obtained: CT head-unremarkable  Impression: 60 year old female with a history of hypertension, hyperlipidemia and headaches who presents with both 2 episodes of transient visual change as well as for episodes of right arm numbness. The visual change I asked a suspect may be more likely to be related to migraine aura, however the description of the right arm numbness is more concerning. There is or positive symptoms or migration of symptoms as can be seen with migraine aura. She also does not have a history of similar episodes with her headaches. In someone with stroke risk factors I think that treating this as TIA would be prudent.  Recommendations: 1. HgbA1c, fasting lipid panel 2. MRI, MRA  of the brain without contrast 3. Frequent neuro checks 4. Echocardiogram 5. Carotid dopplers 6. Prophylactic therapy-Antiplatelet med: Aspirin - dose 325mg  PO or 300mg  PR 7. Risk factor modification 8. Telemetry monitoring 9. PT consult, OT consult, Speech consult 10. please page stroke NP  Or  PA  Or MD  M-F from 8am -4 pm starting 4/1 as this patient will be followed by the stroke team at this point.   You  can look them up on [email or web address]      Roland Rack, MD [facility] [phone]  If 7pm- 7am, please page neurology on call as listed in [place].

## 2015-12-28 NOTE — ED Notes (Addendum)
PT sent here by her opthalmologist  for possible tia.  Wed she experienced 30 min of bil blurred vision.  Thurs she experienced frontal headache and R arm numbness (pt also has hx of R rotator cuff repair). Neuro intact. Pt poor historian.

## 2015-12-29 ENCOUNTER — Encounter (HOSPITAL_COMMUNITY): Payer: Medicaid Other

## 2015-12-29 DIAGNOSIS — R930 Abnormal findings on diagnostic imaging of skull and head, not elsewhere classified: Secondary | ICD-10-CM

## 2015-12-29 DIAGNOSIS — I10 Essential (primary) hypertension: Secondary | ICD-10-CM | POA: Diagnosis not present

## 2015-12-29 DIAGNOSIS — G8929 Other chronic pain: Secondary | ICD-10-CM

## 2015-12-29 DIAGNOSIS — E785 Hyperlipidemia, unspecified: Secondary | ICD-10-CM | POA: Diagnosis present

## 2015-12-29 DIAGNOSIS — G459 Transient cerebral ischemic attack, unspecified: Secondary | ICD-10-CM

## 2015-12-29 LAB — LIPID PANEL
CHOL/HDL RATIO: 5 ratio
Cholesterol: 286 mg/dL — ABNORMAL HIGH (ref 0–200)
HDL: 57 mg/dL (ref >40)
LDL CALC: 194 mg/dL — AB (ref 0–99)
TRIGLYCERIDES: 177 mg/dL — AB (ref <150)
VLDL: 35 mg/dL (ref 0–40)

## 2015-12-29 MED ORDER — PANTOPRAZOLE SODIUM 40 MG PO TBEC
40.0000 mg | DELAYED_RELEASE_TABLET | Freq: Every day | ORAL | Status: DC
  Administered 2015-12-29 – 2015-12-30 (×2): 40 mg via ORAL
  Filled 2015-12-29 (×2): qty 1

## 2015-12-29 MED ORDER — DOXEPIN HCL 25 MG PO CAPS
50.0000 mg | ORAL_CAPSULE | Freq: Every day | ORAL | Status: DC
  Administered 2015-12-29 (×2): 50 mg via ORAL
  Filled 2015-12-29 (×2): qty 2

## 2015-12-29 MED ORDER — VITAMIN D 1000 UNITS PO TABS
1000.0000 [IU] | ORAL_TABLET | Freq: Every day | ORAL | Status: DC
  Administered 2015-12-29 – 2015-12-30 (×2): 1000 [IU] via ORAL
  Filled 2015-12-29 (×2): qty 1

## 2015-12-29 MED ORDER — ASPIRIN 325 MG PO TABS
325.0000 mg | ORAL_TABLET | Freq: Every day | ORAL | Status: DC
  Administered 2015-12-29 – 2015-12-30 (×2): 325 mg via ORAL
  Filled 2015-12-29 (×2): qty 1

## 2015-12-29 MED ORDER — NITROFURANTOIN MONOHYD MACRO 100 MG PO CAPS
100.0000 mg | ORAL_CAPSULE | Freq: Every day | ORAL | Status: DC
  Administered 2015-12-29 (×2): 100 mg via ORAL
  Filled 2015-12-29 (×3): qty 1

## 2015-12-29 MED ORDER — CLONAZEPAM 1 MG PO TABS
1.0000 mg | ORAL_TABLET | Freq: Three times a day (TID) | ORAL | Status: DC | PRN
  Administered 2015-12-29 – 2015-12-30 (×5): 1 mg via ORAL
  Filled 2015-12-29 (×5): qty 1

## 2015-12-29 MED ORDER — HYDROMORPHONE HCL 2 MG PO TABS: 2.0000 mg | ORAL_TABLET | Freq: Every day | ORAL | Status: DC | PRN

## 2015-12-29 MED ORDER — TIZANIDINE HCL 4 MG PO TABS
4.0000 mg | ORAL_TABLET | Freq: Four times a day (QID) | ORAL | Status: DC | PRN
  Administered 2015-12-29 – 2015-12-30 (×6): 4 mg via ORAL
  Filled 2015-12-29 (×7): qty 1

## 2015-12-29 MED ORDER — ACETAMINOPHEN 650 MG RE SUPP: 650.0000 mg | RECTAL | Status: DC | PRN

## 2015-12-29 MED ORDER — ZOLPIDEM TARTRATE 5 MG PO TABS
5.0000 mg | ORAL_TABLET | Freq: Every day | ORAL | Status: DC
  Administered 2015-12-29 (×2): 5 mg via ORAL
  Filled 2015-12-29 (×2): qty 1

## 2015-12-29 MED ORDER — HYDRALAZINE HCL 20 MG/ML IJ SOLN: 20.0000 mg | INTRAMUSCULAR | Status: DC | PRN

## 2015-12-29 MED ORDER — ACETAMINOPHEN 325 MG PO TABS
650.0000 mg | ORAL_TABLET | ORAL | Status: DC | PRN
  Administered 2015-12-29 – 2015-12-30 (×3): 650 mg via ORAL
  Filled 2015-12-29 (×3): qty 2

## 2015-12-29 MED ORDER — LUBIPROSTONE 24 MCG PO CAPS
24.0000 ug | ORAL_CAPSULE | Freq: Every day | ORAL | Status: DC
  Administered 2015-12-29 – 2015-12-30 (×2): 24 ug via ORAL
  Filled 2015-12-29 (×2): qty 1

## 2015-12-29 MED ORDER — MONTELUKAST SODIUM 10 MG PO TABS
10.0000 mg | ORAL_TABLET | Freq: Every day | ORAL | Status: DC
  Administered 2015-12-29 (×2): 10 mg via ORAL
  Filled 2015-12-29 (×2): qty 1

## 2015-12-29 MED ORDER — DULOXETINE HCL 60 MG PO CPEP
60.0000 mg | ORAL_CAPSULE | Freq: Every day | ORAL | Status: DC
  Administered 2015-12-29 (×2): 60 mg via ORAL
  Filled 2015-12-29 (×2): qty 1

## 2015-12-29 MED ORDER — FLUTICASONE PROPIONATE 50 MCG/ACT NA SUSP
1.0000 | Freq: Every day | NASAL | Status: DC
  Administered 2015-12-29 – 2015-12-30 (×2): 1 via NASAL
  Filled 2015-12-29: qty 16

## 2015-12-29 MED ORDER — CELECOXIB 100 MG PO CAPS: 100.0000 mg | ORAL_CAPSULE | Freq: Every day | ORAL | Status: DC | PRN

## 2015-12-29 MED ORDER — NADOLOL 40 MG PO TABS
40.0000 mg | ORAL_TABLET | Freq: Every day | ORAL | Status: DC
  Administered 2015-12-29: 40 mg via ORAL
  Filled 2015-12-29 (×3): qty 1

## 2015-12-29 NOTE — Progress Notes (Signed)
Accidentally administered Floricet at 0220 instead of Nadolol. Nadolol has been administered afterward. Floricet not on pt MAR. NP (Lynch) notified. Pt made aware and to report any unusual signs/symptoms. Will monitor any signs/symptoms for med error.

## 2015-12-29 NOTE — Progress Notes (Signed)
STROKE TEAM PROGRESS NOTE   HISTORY OF PRESENT ILLNESS Kristin Pope is a 60 y.o. female history of multiple medical problems who presents with several complaints. The first is visual changes which she describes as noticing that she can't see her hand in an area of her vision that she is typically able to see it. This has happened twice, the first time lasted at least 45 minutes and then she went to bed. The second happened today shortly after having her eyes dilated at the ophthalmologist and lasted approximately 30 minutes.  She also complains of intermittent right arm numbness. She states that when this happens it occurs suddenly, there is no progression of symptoms, no paresthesia, and no association with headache. This has happened 4 times over the past month.  She does have chronic headaches. She denies a history of migraines, however on further history she used to get headaches in the 80s which were associated with photophobia. More recently over the past year and a half she has been having increasing severity in her headaches. She does have a headache today with photophobia.   SUBJECTIVE (INTERVAL HISTORY) No family members present. The patient believes she may have had a stroke in the past. She reports that she has a strong family history of vascular disease. As well as migraines. MRI scan of the brain personally reviewed shows no acute infarct   OBJECTIVE Temp:  [97.8 F (36.6 C)-98.6 F (37 C)] 98 F (36.7 C) (04/01 0901) Pulse Rate:  [46-103] 50 (04/01 1134) Cardiac Rhythm:  [-] Sinus bradycardia (04/01 0700) Resp:  [10-19] 12 (04/01 1134) BP: (95-164)/(48-92) 110/53 mmHg (04/01 1134) SpO2:  [92 %-100 %] 92 % (04/01 1134) Weight:  [74.6 kg (164 lb 7.4 oz)-74.957 kg (165 lb 4 oz)] 74.6 kg (164 lb 7.4 oz) (04/01 0030)  CBC:   Recent Labs Lab 12/28/15 1623 12/28/15 1632  WBC 7.8  --   NEUTROABS 4.3  --   HGB 12.9 15.0  HCT 40.2 44.0  MCV 95.3  --   PLT 363  --      Basic Metabolic Panel:   Recent Labs Lab 12/28/15 1623 12/28/15 1632  NA 141 141  K 4.5 4.6  CL 102 100*  CO2 28  --   GLUCOSE 104* 99  BUN 15 20  CREATININE 0.87 0.80  CALCIUM 10.1  --     Lipid Panel:     Component Value Date/Time   CHOL 201* 11/12/2011 1610   TRIG 213.0* 11/12/2011 1610   HDL 58.90 11/12/2011 1610   CHOLHDL 3 11/12/2011 1610   VLDL 42.6* 11/12/2011 1610   HgbA1c:  Lab Results  Component Value Date   HGBA1C 5.6 11/12/2011   Urine Drug Screen: No results found for: LABOPIA, COCAINSCRNUR, LABBENZ, AMPHETMU, THCU, LABBARB      IMAGING  Ct Head Wo Contrast 12/28/2015   No evidence of acute intracranial abnormality. Atrophy with small vessel ischemic changes.    Mr Angiogram Head and Neck Wo Contrast 12/28/2015   1. No acute intracranial abnormality.  2. Chronic left cerebellar infarct, new from 2010.  3. No major intracranial arterial occlusion or significant stenosis.  4. No significant arterial stenosis identified in the neck. Suboptimal evaluation of the proximal common carotid arteries.  5. Mild luminal regularity involving the right cervical ICA and right vertebral artery V3 segment, query mild fibromuscular dysplasia.    Mr Kizzie Fantasia Contrast 12/28/2015   1. No acute intracranial abnormality.  2. Chronic left  cerebellar infarct, new from 2010.  3. No major intracranial arterial occlusion or significant stenosis.  4. No significant arterial stenosis identified in the neck. Suboptimal evaluation of the proximal common carotid arteries.  5. Mild luminal regularity involving the right cervical ICA and right vertebral artery V3 segment, query mild fibromuscular dysplasia.     PHYSICAL EXAM Pleasant middle-aged Caucasian lady currently not in distress. . Afebrile. Head is nontraumatic. Neck is supple without bruit.    Cardiac exam no murmur or gallop. Lungs are clear to auscultation. Distal pulses are well felt.  Neurological Exam ;   Awake  Alert oriented x 3. Normal speech and language.eye movements full without nystagmus.fundi were not visualized. Vision acuity and fields appear normal. Hearing is normal. Palatal movements are normal. Face symmetric. Tongue midline. Normal strength, tone, reflexes and coordination. Normal sensation. Gait deferred.     ASSESSMENT/PLAN Kristin Pope is a 60 y.o. female with history of hypertension, hyperlipidemia, chronic fatigue, reactive airway disease, sleep apnea, borderline diabetes, and possibly migraine headaches presenting with intermittent right arm numbness, visual changes, and headache with photophobia.  She did not receive IV t-PA due to mild transient deficits.   Possible TIA:  Dominant   Resultant  Resolution of deficits.  MRI - No acute intracranial abnormality but chronic left cerebellar infarct, new from 2010.  MRA - no significant abnormality but query mild fibromuscular dysplasia.   Carotid Doppler pending  2D Echo pending  LDL pending  HgbA1c pending  VTE prophylaxis - will order SCDs Diet Heart Room service appropriate?: Yes; Fluid consistency:: Thin  No antithrombotic prior to admission, now on aspirin 325 mg daily  Patient counseled to be compliant with her antithrombotic medications  Ongoing aggressive stroke risk factor management  Therapy recommendations: pending   Disposition:  Pending  Hypertension  Blood pressure frequently low. The patient was on Corgard 40 mg daily prior to admission and currently.   Hyperlipidemia  Home meds: No lipid lowering medications prior to admission.  LDL pending, goal < 70    Other Stroke Risk Factors  Advanced age  Cigarette smoker, quit smoking   Obesity, Body mass index is 34.38 kg/(m^2).   Hx stroke by MRI.  Family hx stroke (maternal grandfather)  Migraines  Obstructive sleep apnea  Other Active Problems  History of borderline diabetes   PLAN  Finish workup  May be  able to discharge tomorrow.   Hospital day #   Mikey Bussing PA-C [facility] Pager [phone] 12/29/2015, 12:55 PM  I have personally examined this patient, reviewed notes, independently viewed imaging studies, participated in medical decision making and plan of care. I have made any additions or clarifications directly to the above note. Agree with note above. Albrecht status. She presented with recurrent blurred vision with headache and numbness etiology indeterminate possible posterior circulation TIA versus complicated migraine. Seizures less likely. She remains at risk for neurological worsening, recurrent stroke, TIA and needs ongoing evaluation and aggressive risk factor control.  Antony Contras, MD Medical Director [facility] Pager: [phone] 12/29/2015 1:58 PM    To contact Stroke Continuity provider, please refer to [email or web address] After hours, contact General Neurology

## 2015-12-29 NOTE — H&P (Signed)
[facility] History and Physical  Kristin Pope [id number] DOB: 02/01/1956 DOA: 12/28/2015  PCP: Marijean Bravo, MD  Specialists: Referred by her ophthamologist  Chief Complaint: Recurrent, transient vision disturbance and right upper extremity parasthesias  HPI: Kristin Pope is a 60 y.o. woman with a history of HTN, HLD, recurrent UTIs on suppression therapy with macrobid, and chronic pain who was referred to the ED by her ophthamologist for evaluation for TIA/CVA because she has had recurrent, self-limited vision disturbance as well as right upper extremity numbness and tingling.  He sent over brief documentation, which stated that he did not find any evidence of retinal artery occlusion on his exam.  She has also had intermittent headache.  No nausea or vomiting.  No change in bowel or bladder habits.  No syncope.  Recent chest pressure and congestion attributed to pollen in the air.  The patient was evaluated by neurology in the ED.  No acute stroke on MRI.  Hospitalist asked to admit for further evaluation.  Review of Systems: 12 systems reviewed and negative except as stated in HPI.  Past Medical History  Diagnosis Date  . Allergy   . Depression   . Fibromyalgia   . Hypertension   . Hyperlipidemia   . Low back pain   . Vaginitis     atropic-ongoing abnormal vaginal bleeding-had ultrasound and biopsy in last couple months  . Chronic fatigue   . Headache     constant headaches  . Arthritis     neck and spine, with bone spurs  . Torn rotator cuff     right shoulder  . Family history of adverse reaction to anesthesia     sister has problems waking up  . Reactive airway disease   . Diverticulitis 2011    HAD BOWEL PERFORATION  . Sleep apnea     does not use sleep - was told did not need cpap  . Abnormal brain MRI     per pt   . Hx: UTI (urinary tract infection)   . Anemia, pernicious   . Borderline diabetes     no meds  . Anxiety   . DDD (degenerative disc  disease), cervical     ALSO LUMBAR AREA  . Hx of pyelonephritis 2007  . MVP (mitral valve prolapse)    Past Surgical History  Procedure Laterality Date  . Hysteroscopy  01/2011  . Radial optic neurotomy      twice in lumbar area of back-every 6 months  . Colonoscopy w/ polypectomy    . Breast surgery      breast biopsy-benign  . Dilation and curettage of uterus    . Total hip arthroplasty Left 07/17/2015    Procedure: LEFT TOTAL HIP ARTHROPLASTY ANTERIOR APPROACH;  Surgeon: Paralee Cancel, MD;  Location: [facility];  Service: Orthopedics;  Laterality: Left;  . Total hip arthroplasty Right 08/21/2015    Procedure: RIGHT TOTAL HIP ARTHROPLASTY ANTERIOR APPROACH;  Surgeon: Paralee Cancel, MD;  Location: [facility];  Service: Orthopedics;  Laterality: Right;   Social History:  Social History   Social History Narrative  Remote tobacco use.  No EtOH or illicit drug use.  She is not married.  She does not have any children.  She considers her mother and sister her next of kin.  Allergies  Allergen Reactions  . Morphine And Related Other (See Comments)    Headaches - pt can take hydromorphone  . Codeine Other (See Comments)    REACTION: Insomnia  .  Fentanyl Nausea And Vomiting    Fentanyl patch - nausea and vomiting.  . Gluten Meal Other (See Comments)    Pt avoids eating gluten  . Glutethimides Other (See Comments)    Pt does not remember this reaction  . Lactose Intolerance (Gi) Other (See Comments)    Upset stomach  . Lipitor [Atorvastatin] Other (See Comments)    Pain, myalgias  . Monosodium Glutamate Other (See Comments)    Increases blood pressure  . Tetracycline Nausea And Vomiting  . Tramadol Other (See Comments)    Passed out  . Hydrocodone-Acetaminophen Nausea And Vomiting    REACTION: Fluid retention. Tolerates 1/2 tablet.    Family History  Problem Relation Age of Onset  . Coronary artery disease Brother   . Heart disease Mother   . Heart attack Mother   . Breast cancer  Sister   . Lung cancer Sister   . Breast cancer Maternal Aunt   . Breast cancer Maternal Grandmother   . Stroke Maternal Grandfather   . Brain cancer Paternal Grandfather   . Asthma Mother   . Lung cancer Paternal Grandfather   . Rheum arthritis Maternal Grandmother    Prior to Admission medications   Medication Sig Start Date End Date Taking? Authorizing Provider  acetaminophen (TYLENOL) 650 MG CR tablet Take 1,300 mg by mouth 4 (four) times daily as needed for pain. Tylenol Arthritis   Yes Historical Provider, MD  celecoxib (CELEBREX) 100 MG capsule Take 100 mg by mouth daily as needed (leg pain).   Yes Historical Provider, MD  cholecalciferol (VITAMIN D) 1000 UNITS tablet Take 1,000 Units by mouth daily with lunch.    Yes Historical Provider, MD  clonazePAM (KLONOPIN) 1 MG tablet Take 1 tablet (1 mg total) by mouth 4 (four) times daily. Patient taking differently: Take 1 mg by mouth 4 (four) times daily as needed for anxiety.  07/19/15  Yes Danae Orleans, PA-C  conjugated estrogens (PREMARIN) vaginal cream Place 1 Applicatorful vaginally See admin instructions. Use up to twice weekly as needed to thicken vaginal wall   Yes Historical Provider, MD  cyanocobalamin (,VITAMIN B-12,) 1000 MCG/ML injection Inject 1,000 mcg into the muscle See admin instructions. Vitamin B12 - last injection approx 12/07/15 - done at Dr. Shanon Brow Talbot's office ([place], [place]) every 3 or 4 months   Yes Historical Provider, MD  Cyanocobalamin (VITAMIN B-12) 5000 MCG TBDP Take 5,000 mcg by mouth daily with lunch.   Yes Historical Provider, MD  doxepin (SINEQUAN) 50 MG capsule Take 50 mg by mouth at bedtime.    Yes Historical Provider, MD  DULoxetine (CYMBALTA) 60 MG capsule Take 60 mg by mouth at bedtime.  07/28/15  Yes Historical Provider, MD  fluticasone (FLONASE) 50 MCG/ACT nasal spray Place 1 spray into both nostrils daily. 12/07/15  Yes Historical Provider, MD  furosemide (LASIX) 20 MG tablet Take 20 mg  by mouth every Monday, Wednesday, and Friday.   Yes Historical Provider, MD  GuaiFENesin (MUCINEX PO) Take 1 tablet by mouth 2 (two) times daily as needed (congestion from seasonal allergies).   Yes Historical Provider, MD  hydrocortisone cream 1 % Apply 1 application topically at bedtime as needed for itching.    Yes Historical Provider, MD  HYDROmorphone (DILAUDID) 2 MG tablet Take 1-2 tablets (2-4 mg total) by mouth every 4 (four) hours as needed for severe pain. Patient taking differently: Take 2 mg by mouth daily as needed for severe pain.  08/22/15  Yes Danae Orleans, PA-C  lubiprostone (AMITIZA) 24 MCG capsule Take 1 capsule (24 mcg total) by mouth daily with breakfast. Patient taking differently: Take 24 mcg by mouth at bedtime.  01/20/12  Yes Ricard Dillon, MD  montelukast (SINGULAIR) 10 MG tablet Take 10 mg by mouth at bedtime.    Yes Historical Provider, MD  nadolol (CORGARD) 40 MG tablet TAKE 1 TABLET EVERY DAY Patient taking differently: TAKE 1 TABLET EVERY DAY AT BEDTIME 12/17/12  Yes Ricard Dillon, MD  nitrofurantoin, macrocrystal-monohydrate, (MACROBID) 100 MG capsule Take 100 mg by mouth at bedtime. Continuous course 12/07/15  Yes Historical Provider, MD  NUVIGIL 150 MG tablet TAKE 1 TABLET BY MOUTH EVERY DAY Patient taking differently: TAKE 1/2 TO 1 TABLET BY MOUTH 2 TIMES DAILY AS NEEDED FOR ENERGY BOOST 07/15/12  Yes Ricard Dillon, MD  omeprazole (PRILOSEC) 40 MG capsule Take 40 mg by mouth at bedtime.    Yes Historical Provider, MD  potassium chloride (KLOR-CON) 8 MEQ tablet 1 tablet daily Patient taking differently: Take 1 tablet daily at bedtime 04/04/13  Yes Ricard Dillon, MD  TESTOSTERONE IM Inject 1 application into the muscle See admin instructions. Every 2 1/2 months at Dr. Stann Mainland at [place] office - last injection approx 1st week of March   Yes Historical Provider, MD  tiZANidine (ZANAFLEX) 4 MG tablet Take 4 mg by mouth 4 (four) times daily as needed (pain).   12/02/15  Yes Historical Provider, MD  zolpidem (AMBIEN) 10 MG tablet Take 10 mg by mouth at bedtime.    Yes Historical Provider, MD   Physical Exam: Filed Vitals:   12/28/15 2315 12/28/15 2345 12/29/15 0030 12/29/15 0215  BP: 155/92 160/80 152/90 136/59  Pulse: 53 48 48 62  Temp:   98.6 F (37 C)   TempSrc:   Oral   Resp: 19 10 13    Height:   4\' 10"  (1.473 m)   Weight:   74.6 kg (164 lb 7.4 oz)   SpO2: 96% 94% 94% 94%    General:  Awake and alert.  Oriented to person, place, time and situation.  NAD.  Asking for her PM medications. Eyes: PERRL bilaterally, conjunctiva are pink.  EOMI. ENT: Moist mucous membranes.  No nasal drainage. Neck: Supple.   Cardiovascular: NR/RR.  No LE edema. Respiratory: CTA bilaterally. Abdomen: Soft/NT/ND.  Bowel sounds are present.  No guarding. Skin: Warm and dry. Musculoskeletal: Moves all four extremities spontaneously. Psychiatric: Normal affect. Neurologic: CN grossly intact, strength symmetric bilaterally, coordination grossly intact  Labs on Admission:  Basic Metabolic Panel:  Recent Labs Lab 12/28/15 1623 12/28/15 1632  NA 141 141  K 4.5 4.6  CL 102 100*  CO2 28  --   GLUCOSE 104* 99  BUN 15 20  CREATININE 0.87 0.80  CALCIUM 10.1  --    Liver Function Tests:  Recent Labs Lab 12/28/15 1623  AST 20  ALT 14  ALKPHOS 86  BILITOT 0.6  PROT 7.4  ALBUMIN 4.7   CBC:  Recent Labs Lab 12/28/15 1623 12/28/15 1632  WBC 7.8  --   NEUTROABS 4.3  --   HGB 12.9 15.0  HCT 40.2 44.0  MCV 95.3  --   PLT 363  --     Radiological Exams on Admission: Ct Head Wo Contrast  12/28/2015  CLINICAL DATA:  Blurred vision, headache, right arm numbness EXAM: CT HEAD WITHOUT CONTRAST TECHNIQUE: Contiguous axial images were obtained from the base of the skull through the vertex without  intravenous contrast. COMPARISON:  11/08/2010 FINDINGS: No evidence of parenchymal hemorrhage or extra-axial fluid collection. No mass lesion, mass effect, or  midline shift. No CT evidence of acute infarction. Subcortical white matter and periventricular small vessel ischemic changes. Global cortical atrophy.  No ventriculomegaly. The visualized paranasal sinuses are essentially clear. The mastoid air cells are unopacified. No evidence of calvarial fracture. IMPRESSION: No evidence of acute intracranial abnormality. Atrophy with small vessel ischemic changes. Electronically Signed   By: Julian Hy M.D.   On: 12/28/2015 16:59   Mr Angiogram Head Wo Contrast  12/28/2015  CLINICAL DATA:  TIA. Right arm numbness. Several episodes of visual disturbance this past week. All symptoms have since resolved. Patch that EXAM: MRI HEAD WITHOUT AND WITH CONTRAST MRA HEAD WITHOUT CONTRAST MRA NECK WITHOUT AND WITH CONTRAST TECHNIQUE: Multiplanar, multiecho pulse sequences of the brain and surrounding structures were obtained without and with intravenous contrast. Angiographic images of the Circle of Willis were obtained using MRA technique without intravenous contrast. Angiographic images of the neck were obtained using MRA technique without and with intravenous contrast. Carotid stenosis measurements (when applicable) are obtained utilizing NASCET criteria, using the distal internal carotid diameter as the denominator. CONTRAST:  41mL MULTIHANCE GADOBENATE DIMEGLUMINE 529 MG/ML IV SOLN COMPARISON:  Head CT 12/28/2015 and MRI 02/06/2009. FINDINGS: MRI HEAD FINDINGS There is no evidence of acute infarct, intracranial hemorrhage, mass, or midline shift. Prominent CSF spaces over the anterior aspects of both frontal lobes have slightly increased from the prior MRI and are favored to reflect atrophy over subdural collections. A few small foci of T2 hyperintensity in the cerebral white matter and a small focus in the pons are nonspecific but may reflect minimal chronic small vessel ischemic disease, not greater than expected for patient's age. There is a small, chronic superior left  cerebellar infarct which is new from the prior MRI. No abnormal enhancement is identified. Orbits are unremarkable. There are trace mastoid effusions. The paranasal sinuses are clear. Major intracranial vascular flow voids are preserved. MRA HEAD FINDINGS The visualized distal vertebral arteries are patent and codominant. PICA origins are patent. AICA and SCA origins are grossly patent but suboptimally evaluated due to their small size. Basilar artery is patent without stenosis. There is a small left posterior communicating artery. PCAs are patent without evidence of significant proximal stenosis. Internal carotid arteries are patent from skullbase to carotid termini without stenosis there is likely a small infundibulum at the left posterior communicating artery origin. ACAs and MCAs are patent without evidence of major branch occlusion or significant stenosis. MRA NECK FINDINGS Three vessel aortic arch. Brachiocephalic and subclavian arteries are patent. Both proximal common carotid arteries are tortuous and suboptimally evaluated on both contrast enhanced and noncontrast time-of-flight sequences. The more distal common carotid arteries are widely patent. The internal carotid arteries are patent without stenosis. There is mild luminal irregularity/slight beading of the mid cervical ICA the right. The vertebral arteries are patent and codominant with antegrade flow bilaterally. No significant vertebral artery stenosis is identified, although there is mild luminal regularity involving the right V3 segment. IMPRESSION: 1. No acute intracranial abnormality. 2. Chronic left cerebellar infarct, new from 2010. 3. No major intracranial arterial occlusion or significant stenosis. 4. No significant arterial stenosis identified in the neck. Suboptimal evaluation of the proximal common carotid arteries. 5. Mild luminal regularity involving the right cervical ICA and right vertebral artery V3 segment, query mild fibromuscular  dysplasia. Electronically Signed   By: Seymour Bars.D.  On: 12/28/2015 21:18   Mr Angiogram Neck W Wo Contrast  12/28/2015  CLINICAL DATA:  TIA. Right arm numbness. Several episodes of visual disturbance this past week. All symptoms have since resolved. Patch that EXAM: MRI HEAD WITHOUT AND WITH CONTRAST MRA HEAD WITHOUT CONTRAST MRA NECK WITHOUT AND WITH CONTRAST TECHNIQUE: Multiplanar, multiecho pulse sequences of the brain and surrounding structures were obtained without and with intravenous contrast. Angiographic images of the Circle of Willis were obtained using MRA technique without intravenous contrast. Angiographic images of the neck were obtained using MRA technique without and with intravenous contrast. Carotid stenosis measurements (when applicable) are obtained utilizing NASCET criteria, using the distal internal carotid diameter as the denominator. CONTRAST:  20mL MULTIHANCE GADOBENATE DIMEGLUMINE 529 MG/ML IV SOLN COMPARISON:  Head CT 12/28/2015 and MRI 02/06/2009. FINDINGS: MRI HEAD FINDINGS There is no evidence of acute infarct, intracranial hemorrhage, mass, or midline shift. Prominent CSF spaces over the anterior aspects of both frontal lobes have slightly increased from the prior MRI and are favored to reflect atrophy over subdural collections. A few small foci of T2 hyperintensity in the cerebral white matter and a small focus in the pons are nonspecific but may reflect minimal chronic small vessel ischemic disease, not greater than expected for patient's age. There is a small, chronic superior left cerebellar infarct which is new from the prior MRI. No abnormal enhancement is identified. Orbits are unremarkable. There are trace mastoid effusions. The paranasal sinuses are clear. Major intracranial vascular flow voids are preserved. MRA HEAD FINDINGS The visualized distal vertebral arteries are patent and codominant. PICA origins are patent. AICA and SCA origins are grossly patent but  suboptimally evaluated due to their small size. Basilar artery is patent without stenosis. There is a small left posterior communicating artery. PCAs are patent without evidence of significant proximal stenosis. Internal carotid arteries are patent from skullbase to carotid termini without stenosis there is likely a small infundibulum at the left posterior communicating artery origin. ACAs and MCAs are patent without evidence of major branch occlusion or significant stenosis. MRA NECK FINDINGS Three vessel aortic arch. Brachiocephalic and subclavian arteries are patent. Both proximal common carotid arteries are tortuous and suboptimally evaluated on both contrast enhanced and noncontrast time-of-flight sequences. The more distal common carotid arteries are widely patent. The internal carotid arteries are patent without stenosis. There is mild luminal irregularity/slight beading of the mid cervical ICA the right. The vertebral arteries are patent and codominant with antegrade flow bilaterally. No significant vertebral artery stenosis is identified, although there is mild luminal regularity involving the right V3 segment. IMPRESSION: 1. No acute intracranial abnormality. 2. Chronic left cerebellar infarct, new from 2010. 3. No major intracranial arterial occlusion or significant stenosis. 4. No significant arterial stenosis identified in the neck. Suboptimal evaluation of the proximal common carotid arteries. 5. Mild luminal regularity involving the right cervical ICA and right vertebral artery V3 segment, query mild fibromuscular dysplasia. Electronically Signed   By: Logan Bores M.D.   On: 12/28/2015 21:18   Mr Jeri Cos [id number] Contrast  12/28/2015  CLINICAL DATA:  TIA. Right arm numbness. Several episodes of visual disturbance this past week. All symptoms have since resolved. Patch that EXAM: MRI HEAD WITHOUT AND WITH CONTRAST MRA HEAD WITHOUT CONTRAST MRA NECK WITHOUT AND WITH CONTRAST TECHNIQUE: Multiplanar,  multiecho pulse sequences of the brain and surrounding structures were obtained without and with intravenous contrast. Angiographic images of the Circle of Willis were obtained using MRA technique without intravenous contrast.  Angiographic images of the neck were obtained using MRA technique without and with intravenous contrast. Carotid stenosis measurements (when applicable) are obtained utilizing NASCET criteria, using the distal internal carotid diameter as the denominator. CONTRAST:  24mL MULTIHANCE GADOBENATE DIMEGLUMINE 529 MG/ML IV SOLN COMPARISON:  Head CT 12/28/2015 and MRI 02/06/2009. FINDINGS: MRI HEAD FINDINGS There is no evidence of acute infarct, intracranial hemorrhage, mass, or midline shift. Prominent CSF spaces over the anterior aspects of both frontal lobes have slightly increased from the prior MRI and are favored to reflect atrophy over subdural collections. A few small foci of T2 hyperintensity in the cerebral white matter and a small focus in the pons are nonspecific but may reflect minimal chronic small vessel ischemic disease, not greater than expected for patient's age. There is a small, chronic superior left cerebellar infarct which is new from the prior MRI. No abnormal enhancement is identified. Orbits are unremarkable. There are trace mastoid effusions. The paranasal sinuses are clear. Major intracranial vascular flow voids are preserved. MRA HEAD FINDINGS The visualized distal vertebral arteries are patent and codominant. PICA origins are patent. AICA and SCA origins are grossly patent but suboptimally evaluated due to their small size. Basilar artery is patent without stenosis. There is a small left posterior communicating artery. PCAs are patent without evidence of significant proximal stenosis. Internal carotid arteries are patent from skullbase to carotid termini without stenosis there is likely a small infundibulum at the left posterior communicating artery origin. ACAs and MCAs  are patent without evidence of major branch occlusion or significant stenosis. MRA NECK FINDINGS Three vessel aortic arch. Brachiocephalic and subclavian arteries are patent. Both proximal common carotid arteries are tortuous and suboptimally evaluated on both contrast enhanced and noncontrast time-of-flight sequences. The more distal common carotid arteries are widely patent. The internal carotid arteries are patent without stenosis. There is mild luminal irregularity/slight beading of the mid cervical ICA the right. The vertebral arteries are patent and codominant with antegrade flow bilaterally. No significant vertebral artery stenosis is identified, although there is mild luminal regularity involving the right V3 segment. IMPRESSION: 1. No acute intracranial abnormality. 2. Chronic left cerebellar infarct, new from 2010. 3. No major intracranial arterial occlusion or significant stenosis. 4. No significant arterial stenosis identified in the neck. Suboptimal evaluation of the proximal common carotid arteries. 5. Mild luminal regularity involving the right cervical ICA and right vertebral artery V3 segment, query mild fibromuscular dysplasia. Electronically Signed   By: Logan Bores M.D.   On: 12/28/2015 21:18    EKG: Independently reviewed. Sinus bradycardia, no acute ST segment changes  Assessment/Plan Principal Problem:   TIA (transient ischemic attack) Active Problems:   Essential hypertension   Chronic pain  Admit for observation, telemetry  Transient neuro symptoms (vision, RUE parasthesias) concerning for TIA --Neurology consult greatly appreciated --Echo, Carotid ultrasound, fasting lipid panel, A1c in AM --Full strength aspirin for now  HTN --Continue home medications  Anxiety, chronic pain --continue home medications  Polypharmacy noted.  Would likely benefit, in general, if some of her home medications could be reduced and/or eliminated.  Code Status: FULL Family  Communication: Patient alone at time of admission Disposition Plan: Anticipate discharge to home tomorrow  Time spent: 50 minutes  [employer] [facility]  12/29/2015, 4:38 AM

## 2015-12-29 NOTE — Progress Notes (Signed)
Arrived from ED. Alert and oriented. Denies any pain. Ambulated to restroom with steady gait. Oriented to room. Call light within reach.

## 2015-12-29 NOTE — Progress Notes (Signed)
Occupational Therapy Evaluation/Discharge Patient Details Name: Kristin Pope MRN: [id number] DOB: May 18, 1956 Today's Date: 12/29/2015    History of Present Illness Pt is a 60 y.o. woman with a history of HTN, HLD, recurrent UTIs, chronic low back pain, fibromyalgia, s/p L THA 06/2015 and R THA 07/2015 who was referred to the ED by her ophthamologist for evaluation for TIA/CVA because she has had recurrent, self-limited vision disturbance as well as right upper extremity numbness and tingling.   Clinical Impression   Patient has been evaluated by Occupational Therapy with no acute OT needs identified. Pt completed all ADLs and mobility at mod I level. All education has been completed and pt has no further questions. OT signing off. Thank you for this referral.    Follow Up Recommendations  No OT follow up    Equipment Recommendations  None recommended by OT    Recommendations for Other Services       Precautions / Restrictions Precautions Precautions: None Restrictions Weight Bearing Restrictions: No      Mobility Bed Mobility Overal bed mobility: Modified Independent                Transfers Overall transfer level: Modified independent Equipment used: None                  Balance Overall balance assessment: No apparent balance deficits (not formally assessed)                                          ADL Overall ADL's : Modified independent                                             Vision Vision Assessment?: Yes Eye Alignment: Within Functional Limits Ocular Range of Motion: Within Functional Limits Alignment/Gaze Preference: Within Defined Limits Tracking/Visual Pursuits: Able to track stimulus in all quads without difficulty Saccades: Within functional limits Convergence: Within functional limits Visual Fields: No apparent deficits   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 Perception Deficits:  Inattention/neglect Inattention/Neglect: Appears intact   Praxis Praxis Praxis tested?: Within functional limits    Pertinent Vitals/Pain Pain Assessment: No/denies pain     Hand Dominance Right   Extremity/Trunk Assessment Upper Extremity Assessment Upper Extremity Assessment: Overall WFL for tasks assessed   Lower Extremity Assessment Lower Extremity Assessment: Overall WFL for tasks assessed   Cervical / Trunk Assessment Cervical / Trunk Assessment: Normal   Communication Communication Communication: No difficulties   Cognition Arousal/Alertness: Awake/alert Behavior During Therapy: WFL for tasks assessed/performed Overall Cognitive Status: Within Functional Limits for tasks assessed                     General Comments       Exercises       Shoulder Instructions      Home Living Family/patient expects to be discharged to:: Private residence Living Arrangements: Alone Available Help at Discharge: Family;Available PRN/intermittently Type of Home: House Home Access: Stairs to enter [employer] of Steps: 4 Entrance Stairs-Rails: Right;Left Home Layout: One level     Bathroom Shower/Tub: Walk-in shower;Door   [employer] Toilet: Standard     Home Equipment: Bedside commode;Walker - 2 wheels;Shower seat          Prior Functioning/Environment Level of  Independence: Independent        Comments: Does not cook or clean. Typically buys already prepared dinners    OT Diagnosis: Disturbance of vision (resolved)   OT Problem List: Decreased safety awareness;Decreased activity tolerance   OT Treatment/Interventions:      OT Goals(Current goals can be found in the care plan section) Acute Rehab OT Goals Patient Stated Goal: to go home today OT Goal Formulation: With patient Time For Goal Achievement: 01/12/16 Potential to Achieve Goals: Good  OT Frequency:     Barriers to D/C:            Co-evaluation              End of  Session Equipment Utilized During Treatment: Gait belt Nurse Communication: Mobility status  Activity Tolerance: Patient tolerated treatment well Patient left: in bed;with call bell/phone within reach;with SCD's reapplied   Time: 1520-1536 OT Time Calculation (min): 16 min Charges:  OT General Charges $OT Visit: 1 Procedure OT Evaluation $OT Eval Low Complexity: 1 Procedure G-Codes: OT G-codes **NOT FOR INPATIENT CLASS** Functional Assessment Tool Used: clinical judgement Functional Limitation: Self care Self Care Current Status [id number]): 0 percent impaired, limited or restricted Self Care Goal Status [id number]): 0 percent impaired, limited or restricted Self Care Discharge Status [id number]): 0 percent impaired, limited or restricted  Redmond Baseman, OTR/L Pager: [phone] 12/29/2015, 5:08 PM

## 2015-12-29 NOTE — Progress Notes (Signed)
Progress Note   Kristin Pope [id number] DOB: December 11, 1955 DOA: 12/28/2015 PCP: Marijean Bravo, MD   Brief Narrative:   Kristin Pope is an 60 y.o. female with a PMH of hypertension, hyperlipidemia, chronic UTIs on suppressive therapy with Macrobid, and chronic pain who was admitted 12/28/15 upon the advice of her ophthalmologist to come here for evaluation of recurrent self-limited visual disturbance accompanied by right upper extremity numbness/tingling. Ophthalmologist reports there was no evidence of retinal artery occlusion on his exam. Initial workup in the ED including MRI negative for acute stroke.  Assessment/Plan:   Principal Problem:   TIA (transient ischemic attack) Neurology consulted on admission. Full stroke workup including carotid Dopplers, echo, hemoglobin A1c, and FLP all ordered. MRI negative for acute intracranial abnormality but did show a chronic left cerebellar infarct which was new compared to imaging done in 2010. There were no major intracranial arterial occlusions or stenoses. There was a suggestion of possible mild fibromuscular dysplasia due to mild luminal regularity involving the right cervical ICA and right vertebral artery V3 segment. Continue aspirin. Follow-up PT/OT evaluations.  Active Problems:   Hyperlipidemia Has an intolerance to Lipitor with myalgias. We'll discuss further treatment options with the patient.    Essential hypertension Continue nadolol. Blood pressure controlled.    Chronic pain Continue Celebrex, Zanaflex and Cymbalta.    DVT Prophylaxis Lovenox ordered.   Family Communication/Anticipated D/C date and plan/Code Status   Family Communication: No family currently at the bedside. Disposition Plan/date: Home when evaluation completed pending neurological final recommendations, likely 12/30/15. Code Status: Full code.   Procedures and diagnostic studies:   Ct Head Wo Contrast  12/28/2015  CLINICAL DATA:  Blurred  vision, headache, right arm numbness EXAM: CT HEAD WITHOUT CONTRAST TECHNIQUE: Contiguous axial images were obtained from the base of the skull through the vertex without intravenous contrast. COMPARISON:  11/08/2010 FINDINGS: No evidence of parenchymal hemorrhage or extra-axial fluid collection. No mass lesion, mass effect, or midline shift. No CT evidence of acute infarction. Subcortical white matter and periventricular small vessel ischemic changes. Global cortical atrophy.  No ventriculomegaly. The visualized paranasal sinuses are essentially clear. The mastoid air cells are unopacified. No evidence of calvarial fracture. IMPRESSION: No evidence of acute intracranial abnormality. Atrophy with small vessel ischemic changes. Electronically Signed   By: Julian Hy M.D.   On: 12/28/2015 16:59   Mr Angiogram Head Wo Contrast  12/28/2015  CLINICAL DATA:  TIA. Right arm numbness. Several episodes of visual disturbance this past week. All symptoms have since resolved. Patch that EXAM: MRI HEAD WITHOUT AND WITH CONTRAST MRA HEAD WITHOUT CONTRAST MRA NECK WITHOUT AND WITH CONTRAST TECHNIQUE: Multiplanar, multiecho pulse sequences of the brain and surrounding structures were obtained without and with intravenous contrast. Angiographic images of the Circle of Willis were obtained using MRA technique without intravenous contrast. Angiographic images of the neck were obtained using MRA technique without and with intravenous contrast. Carotid stenosis measurements (when applicable) are obtained utilizing NASCET criteria, using the distal internal carotid diameter as the denominator. CONTRAST:  8mL MULTIHANCE GADOBENATE DIMEGLUMINE 529 MG/ML IV SOLN COMPARISON:  Head CT 12/28/2015 and MRI 02/06/2009. FINDINGS: MRI HEAD FINDINGS There is no evidence of acute infarct, intracranial hemorrhage, mass, or midline shift. Prominent CSF spaces over the anterior aspects of both frontal lobes have slightly increased from the  prior MRI and are favored to reflect atrophy over subdural collections. A few small foci of T2 hyperintensity in the cerebral white matter  and a small focus in the pons are nonspecific but may reflect minimal chronic small vessel ischemic disease, not greater than expected for patient's age. There is a small, chronic superior left cerebellar infarct which is new from the prior MRI. No abnormal enhancement is identified. Orbits are unremarkable. There are trace mastoid effusions. The paranasal sinuses are clear. Major intracranial vascular flow voids are preserved. MRA HEAD FINDINGS The visualized distal vertebral arteries are patent and codominant. PICA origins are patent. AICA and SCA origins are grossly patent but suboptimally evaluated due to their small size. Basilar artery is patent without stenosis. There is a small left posterior communicating artery. PCAs are patent without evidence of significant proximal stenosis. Internal carotid arteries are patent from skullbase to carotid termini without stenosis there is likely a small infundibulum at the left posterior communicating artery origin. ACAs and MCAs are patent without evidence of major branch occlusion or significant stenosis. MRA NECK FINDINGS Three vessel aortic arch. Brachiocephalic and subclavian arteries are patent. Both proximal common carotid arteries are tortuous and suboptimally evaluated on both contrast enhanced and noncontrast time-of-flight sequences. The more distal common carotid arteries are widely patent. The internal carotid arteries are patent without stenosis. There is mild luminal irregularity/slight beading of the mid cervical ICA the right. The vertebral arteries are patent and codominant with antegrade flow bilaterally. No significant vertebral artery stenosis is identified, although there is mild luminal regularity involving the right V3 segment. IMPRESSION: 1. No acute intracranial abnormality. 2. Chronic left cerebellar infarct,  new from 2010. 3. No major intracranial arterial occlusion or significant stenosis. 4. No significant arterial stenosis identified in the neck. Suboptimal evaluation of the proximal common carotid arteries. 5. Mild luminal regularity involving the right cervical ICA and right vertebral artery V3 segment, query mild fibromuscular dysplasia. Electronically Signed   By: Logan Bores M.D.   On: 12/28/2015 21:18   Mr Angiogram Neck W Wo Contrast  12/28/2015  CLINICAL DATA:  TIA. Right arm numbness. Several episodes of visual disturbance this past week. All symptoms have since resolved. Patch that EXAM: MRI HEAD WITHOUT AND WITH CONTRAST MRA HEAD WITHOUT CONTRAST MRA NECK WITHOUT AND WITH CONTRAST TECHNIQUE: Multiplanar, multiecho pulse sequences of the brain and surrounding structures were obtained without and with intravenous contrast. Angiographic images of the Circle of Willis were obtained using MRA technique without intravenous contrast. Angiographic images of the neck were obtained using MRA technique without and with intravenous contrast. Carotid stenosis measurements (when applicable) are obtained utilizing NASCET criteria, using the distal internal carotid diameter as the denominator. CONTRAST:  80mL MULTIHANCE GADOBENATE DIMEGLUMINE 529 MG/ML IV SOLN COMPARISON:  Head CT 12/28/2015 and MRI 02/06/2009. FINDINGS: MRI HEAD FINDINGS There is no evidence of acute infarct, intracranial hemorrhage, mass, or midline shift. Prominent CSF spaces over the anterior aspects of both frontal lobes have slightly increased from the prior MRI and are favored to reflect atrophy over subdural collections. A few small foci of T2 hyperintensity in the cerebral white matter and a small focus in the pons are nonspecific but may reflect minimal chronic small vessel ischemic disease, not greater than expected for patient's age. There is a small, chronic superior left cerebellar infarct which is new from the prior MRI. No abnormal  enhancement is identified. Orbits are unremarkable. There are trace mastoid effusions. The paranasal sinuses are clear. Major intracranial vascular flow voids are preserved. MRA HEAD FINDINGS The visualized distal vertebral arteries are patent and codominant. PICA origins are patent. AICA and SCA  origins are grossly patent but suboptimally evaluated due to their small size. Basilar artery is patent without stenosis. There is a small left posterior communicating artery. PCAs are patent without evidence of significant proximal stenosis. Internal carotid arteries are patent from skullbase to carotid termini without stenosis there is likely a small infundibulum at the left posterior communicating artery origin. ACAs and MCAs are patent without evidence of major branch occlusion or significant stenosis. MRA NECK FINDINGS Three vessel aortic arch. Brachiocephalic and subclavian arteries are patent. Both proximal common carotid arteries are tortuous and suboptimally evaluated on both contrast enhanced and noncontrast time-of-flight sequences. The more distal common carotid arteries are widely patent. The internal carotid arteries are patent without stenosis. There is mild luminal irregularity/slight beading of the mid cervical ICA the right. The vertebral arteries are patent and codominant with antegrade flow bilaterally. No significant vertebral artery stenosis is identified, although there is mild luminal regularity involving the right V3 segment. IMPRESSION: 1. No acute intracranial abnormality. 2. Chronic left cerebellar infarct, new from 2010. 3. No major intracranial arterial occlusion or significant stenosis. 4. No significant arterial stenosis identified in the neck. Suboptimal evaluation of the proximal common carotid arteries. 5. Mild luminal regularity involving the right cervical ICA and right vertebral artery V3 segment, query mild fibromuscular dysplasia. Electronically Signed   By: Logan Bores M.D.   On:  12/28/2015 21:18   Mr Kristin Pope [id number] Contrast  12/28/2015  CLINICAL DATA:  TIA. Right arm numbness. Several episodes of visual disturbance this past week. All symptoms have since resolved. Patch that EXAM: MRI HEAD WITHOUT AND WITH CONTRAST MRA HEAD WITHOUT CONTRAST MRA NECK WITHOUT AND WITH CONTRAST TECHNIQUE: Multiplanar, multiecho pulse sequences of the brain and surrounding structures were obtained without and with intravenous contrast. Angiographic images of the Circle of Willis were obtained using MRA technique without intravenous contrast. Angiographic images of the neck were obtained using MRA technique without and with intravenous contrast. Carotid stenosis measurements (when applicable) are obtained utilizing NASCET criteria, using the distal internal carotid diameter as the denominator. CONTRAST:  83mL MULTIHANCE GADOBENATE DIMEGLUMINE 529 MG/ML IV SOLN COMPARISON:  Head CT 12/28/2015 and MRI 02/06/2009. FINDINGS: MRI HEAD FINDINGS There is no evidence of acute infarct, intracranial hemorrhage, mass, or midline shift. Prominent CSF spaces over the anterior aspects of both frontal lobes have slightly increased from the prior MRI and are favored to reflect atrophy over subdural collections. A few small foci of T2 hyperintensity in the cerebral white matter and a small focus in the pons are nonspecific but may reflect minimal chronic small vessel ischemic disease, not greater than expected for patient's age. There is a small, chronic superior left cerebellar infarct which is new from the prior MRI. No abnormal enhancement is identified. Orbits are unremarkable. There are trace mastoid effusions. The paranasal sinuses are clear. Major intracranial vascular flow voids are preserved. MRA HEAD FINDINGS The visualized distal vertebral arteries are patent and codominant. PICA origins are patent. AICA and SCA origins are grossly patent but suboptimally evaluated due to their small size. Basilar artery is patent  without stenosis. There is a small left posterior communicating artery. PCAs are patent without evidence of significant proximal stenosis. Internal carotid arteries are patent from skullbase to carotid termini without stenosis there is likely a small infundibulum at the left posterior communicating artery origin. ACAs and MCAs are patent without evidence of major branch occlusion or significant stenosis. MRA NECK FINDINGS Three vessel aortic arch. Brachiocephalic and subclavian arteries are  patent. Both proximal common carotid arteries are tortuous and suboptimally evaluated on both contrast enhanced and noncontrast time-of-flight sequences. The more distal common carotid arteries are widely patent. The internal carotid arteries are patent without stenosis. There is mild luminal irregularity/slight beading of the mid cervical ICA the right. The vertebral arteries are patent and codominant with antegrade flow bilaterally. No significant vertebral artery stenosis is identified, although there is mild luminal regularity involving the right V3 segment. IMPRESSION: 1. No acute intracranial abnormality. 2. Chronic left cerebellar infarct, new from 2010. 3. No major intracranial arterial occlusion or significant stenosis. 4. No significant arterial stenosis identified in the neck. Suboptimal evaluation of the proximal common carotid arteries. 5. Mild luminal regularity involving the right cervical ICA and right vertebral artery V3 segment, query mild fibromuscular dysplasia. Electronically Signed   By: Logan Bores M.D.   On: 12/28/2015 21:18    Medical Consultants:    Neurology  Anti-Infectives:   Anti-infectives    None      Subjective:   Kristin Pope was very sleepy/mildly lethargic when I evaluated her this morning. She denies current visual changes. Does report an ongoing headache. No further paresthesias reported.  Objective:    Filed Vitals:   12/29/15 0901 12/29/15 0936 12/29/15 1134  12/29/15 1448  BP: 111/58 129/66 110/53 105/57  Pulse: 51 50 50 55  Temp: 98 F (36.7 C)   98.1 F (36.7 C)  TempSrc: Oral   Oral  Resp: 14 14 12 14   Height:      Weight:      SpO2: 92% 95% 92% 91%   No intake or output data in the 24 hours ending 12/29/15 1733 Filed Weights   12/28/15 1610 12/29/15 0030  Weight: 74.957 kg (165 lb 4 oz) 74.6 kg (164 lb 7.4 oz)    Exam: Gen:  Lethargic Cardiovascular:  RRR, No M/R/G Respiratory:  Lungs CTAB Gastrointestinal:  Abdomen soft, NT/ND, + BS Extremities:  Trace edema   Data Reviewed:    Labs: Basic Metabolic Panel:  Recent Labs Lab 12/28/15 1623 12/28/15 1632  NA 141 141  K 4.5 4.6  CL 102 100*  CO2 28  --   GLUCOSE 104* 99  BUN 15 20  CREATININE 0.87 0.80  CALCIUM 10.1  --    GFR Estimated Creatinine Clearance: 64.2 mL/min (by C-G formula based on Cr of 0.8). Liver Function Tests:  Recent Labs Lab 12/28/15 1623  AST 20  ALT 14  ALKPHOS 86  BILITOT 0.6  PROT 7.4  ALBUMIN 4.7   Coagulation profile  Recent Labs Lab 12/28/15 1623  INR 0.96    CBC:  Recent Labs Lab 12/28/15 1623 12/28/15 1632  WBC 7.8  --   NEUTROABS 4.3  --   HGB 12.9 15.0  HCT 40.2 44.0  MCV 95.3  --   PLT 363  --    Hgb A1c: No results for input(s): HGBA1C in the last 72 hours. Lipid Profile:  Recent Labs  12/29/15 1214  CHOL 286*  HDL 57  LDLCALC 194*  TRIG 177*  CHOLHDL 5.0    Microbiology No results found for this or any previous visit (from the past 240 hour(s)).   Medications:   . aspirin  325 mg Oral Daily  . cholecalciferol  1,000 Units Oral Q lunch  . doxepin  50 mg Oral QHS  . DULoxetine  60 mg Oral QHS  . fluticasone  1 spray Each Nare Daily  . lubiprostone  24  mcg Oral Q breakfast  . montelukast  10 mg Oral QHS  . nadolol  40 mg Oral QHS  . nitrofurantoin (macrocrystal-monohydrate)  100 mg Oral QHS  . pantoprazole  40 mg Oral Daily  . zolpidem  5 mg Oral QHS   Continuous Infusions:    Time spent: 25 minutes.     [facility] Pager [phone]. If unable to reach me by pager, please call my cell phone at [phone].  *Please refer to [email or web address], password [email or web address] to get updated schedule on who will round on this patient, as hospitalists switch teams weekly. If 7PM-7AM, please contact night-coverage at [email or web address], password [email or web address] for any overnight needs.  12/29/2015, 5:33 PM

## 2015-12-29 NOTE — Evaluation (Signed)
Physical Therapy Evaluation Patient Details Name: Kristin Pope MRN: [id number] DOB: [birth year] Today's Date: 12/29/2015   History of Present Illness  Pt is a 60 y.o. woman with a history of HTN, HLD, recurrent UTIs, chronic low back pain, fibromyalgia, s/p L THA 06/2015 and R THA 07/2015 who was referred to the ED by her ophthamologist for evaluation for TIA/CVA because she has had recurrent, self-limited vision disturbance as well as right upper extremity numbness and tingling.  Clinical Impression  Pt is mod I for all functional mobility. Supervision provided for hallway ambulation and stairs but no assist needed. She is at her baseline level for functional mobility. No skilled PT intervention indicated. PT signing off.    Follow Up Recommendations No PT follow up    Equipment Recommendations  None recommended by PT    Recommendations for Other Services       Precautions / Restrictions Precautions Precautions: None      Mobility  Bed Mobility Overal bed mobility: Modified Independent                Transfers Overall transfer level: Modified independent Equipment used: None                Ambulation/Gait Ambulation/Gait assistance: Modified independent (Device/Increase time) Ambulation Distance (Feet): 250 Feet Assistive device: None Gait Pattern/deviations: WFL(Within Functional Limits) Gait velocity: slow cadence   General Gait Details: steady gait  Stairs Stairs: Yes Stairs assistance: Supervision Stair Management: One rail Right;Alternating pattern;Forwards Number of Stairs: 10    Wheelchair Mobility    Modified Rankin (Stroke Patients Only)       Balance Overall balance assessment: No apparent balance deficits (not formally assessed)                                           Pertinent Vitals/Pain Pain Assessment: No/denies pain    Home Living Family/patient expects to be discharged to:: Private residence Living  Arrangements: Alone Available Help at Discharge: Family;Available PRN/intermittently Type of Home: House Home Access: Stairs to enter Entrance Stairs-Rails: Psychiatric nurse of Steps: 4 Home Layout: One level Home Equipment: Bedside commode;Walker - 2 wheels;Shower seat      Prior Function Level of Independence: Independent               Hand Dominance   Dominant Hand: Right    Extremity/Trunk Assessment   Upper Extremity Assessment: Defer to OT evaluation           Lower Extremity Assessment: Overall WFL for tasks assessed      Cervical / Trunk Assessment: Normal  Communication   Communication: No difficulties  Cognition Arousal/Alertness: Awake/alert Behavior During Therapy: WFL for tasks assessed/performed Overall Cognitive Status: Within Functional Limits for tasks assessed                      General Comments      Exercises        Assessment/Plan    PT Assessment Patent does not need any further PT services  PT Diagnosis Difficulty walking   PT Problem List    PT Treatment Interventions     PT Goals (Current goals can be found in the Care Plan section) Acute Rehab PT Goals Patient Stated Goal: not stated PT Goal Formulation: All assessment and education complete, DC therapy    Frequency  Barriers to discharge        Co-evaluation               End of Session Equipment Utilized During Treatment: Gait belt Activity Tolerance: Patient tolerated treatment well Patient left: in bed;with call bell/phone within reach;with bed alarm set Nurse Communication: Mobility status    Functional Assessment Tool Used: clinical judgement Functional Limitation: Mobility: Walking and moving around Mobility: Walking and Moving Around Current Status [phone]): 0 percent impaired, limited or restricted Mobility: Walking and Moving Around Goal Status [phone]): 0 percent impaired, limited or restricted Mobility: Walking and  Moving Around Discharge Status [phone]): 0 percent impaired, limited or restricted    Time: 0905-0921 PT Time Calculation (min) (ACUTE ONLY): 16 min   Charges:   PT Evaluation $PT Eval Moderate Complexity: 1 Procedure     PT G Codes:   PT G-Codes **NOT FOR INPATIENT CLASS** Functional Assessment Tool Used: clinical judgement Functional Limitation: Mobility: Walking and moving around Mobility: Walking and Moving Around Current Status [phone]): 0 percent impaired, limited or restricted Mobility: Walking and Moving Around Goal Status [phone]): 0 percent impaired, limited or restricted Mobility: Walking and Moving Around Discharge Status [phone]): 0 percent impaired, limited or restricted    Lorriane Shire 12/29/2015, 9:25 AM

## 2015-12-30 ENCOUNTER — Encounter (HOSPITAL_COMMUNITY): Payer: Self-pay | Admitting: Internal Medicine

## 2015-12-30 ENCOUNTER — Observation Stay (HOSPITAL_BASED_OUTPATIENT_CLINIC_OR_DEPARTMENT_OTHER): Payer: Medicaid Other

## 2015-12-30 DIAGNOSIS — G8929 Other chronic pain: Secondary | ICD-10-CM | POA: Diagnosis not present

## 2015-12-30 DIAGNOSIS — I679 Cerebrovascular disease, unspecified: Secondary | ICD-10-CM | POA: Diagnosis present

## 2015-12-30 DIAGNOSIS — I272 Other secondary pulmonary hypertension: Secondary | ICD-10-CM

## 2015-12-30 DIAGNOSIS — I63542 Cerebral infarction due to unspecified occlusion or stenosis of left cerebellar artery: Secondary | ICD-10-CM | POA: Insufficient documentation

## 2015-12-30 DIAGNOSIS — E785 Hyperlipidemia, unspecified: Secondary | ICD-10-CM | POA: Diagnosis present

## 2015-12-30 DIAGNOSIS — G458 Other transient cerebral ischemic attacks and related syndromes: Secondary | ICD-10-CM

## 2015-12-30 DIAGNOSIS — G459 Transient cerebral ischemic attack, unspecified: Secondary | ICD-10-CM

## 2015-12-30 DIAGNOSIS — R930 Abnormal findings on diagnostic imaging of skull and head, not elsewhere classified: Secondary | ICD-10-CM | POA: Diagnosis not present

## 2015-12-30 HISTORY — DX: Cerebral infarction due to unspecified occlusion or stenosis of left cerebellar artery: I63.542

## 2015-12-30 HISTORY — DX: Pulmonary hypertension, unspecified: I27.20

## 2015-12-30 HISTORY — DX: Cerebrovascular disease, unspecified: I67.9

## 2015-12-30 HISTORY — DX: Hyperlipidemia, unspecified: E78.5

## 2015-12-30 HISTORY — PX: TRANSTHORACIC ECHOCARDIOGRAM: SHX275

## 2015-12-30 LAB — ECHOCARDIOGRAM COMPLETE
HEIGHTINCHES: 58 in
WEIGHTICAEL: 2631.41 [oz_av]

## 2015-12-30 MED ORDER — EZETIMIBE 10 MG PO TABS: 10.0000 mg | ORAL_TABLET | Freq: Every day | ORAL | Status: DC

## 2015-12-30 MED ORDER — ASPIRIN 325 MG PO TABS: 325.0000 mg | ORAL_TABLET | Freq: Every day | ORAL | Status: DC

## 2015-12-30 MED ORDER — EZETIMIBE 10 MG PO TABS
10.0000 mg | ORAL_TABLET | Freq: Every day | ORAL | Status: DC
  Administered 2015-12-30: 10 mg via ORAL
  Filled 2015-12-30: qty 1

## 2015-12-30 MED ORDER — ROSUVASTATIN CALCIUM 5 MG PO TABS: 2.5000 mg | ORAL_TABLET | Freq: Every day | ORAL | Status: DC

## 2015-12-30 MED ORDER — ROSUVASTATIN CALCIUM 5 MG PO TABS
2.5000 mg | ORAL_TABLET | Freq: Every day | ORAL | Status: DC
  Administered 2015-12-30: 2.5 mg via ORAL
  Filled 2015-12-30: qty 0.5

## 2015-12-30 NOTE — Progress Notes (Signed)
Pt d/c to home by car with family. Assessment stable. All questions answered. 

## 2015-12-30 NOTE — Progress Notes (Signed)
  Echocardiogram 2D Echocardiogram has been performed.  Jennette Dubin 12/30/2015, 9:28 AM

## 2015-12-30 NOTE — Discharge Summary (Signed)
Physician Discharge Summary  Kristin Pope [id number] DOB: Jul 26, 1956 DOA: 12/28/2015  PCP: Marijean Bravo, MD  Admit date: 12/28/2015 Discharge date: 12/30/2015   Recommendations for Outpatient Follow-Up:   1. Follow-up with PCP in 2 weeks to assess tolerance of low-dose Crestor/Zetia. 2. PCP: Please follow-up and hemoglobin A1c results and consider further outpatient evaluation of mild pulmonary hypertension.   Discharge Diagnosis:   Principal Problem:    TIA (transient ischemic attack) Active Problems:    Essential hypertension    Abnormal finding on MRI of brain    Chronic pain    Hyperlipidemia LDL goal <70    Cerebrovascular disease or lesion   Discharge disposition:  Home.    Discharge Condition: Improved.  Diet recommendation: Low sodium, heart healthy.  Low concentrated sweets.   History of Present Illness:   Kristin Pope is an 60 y.o. female with a PMH of hypertension, hyperlipidemia, chronic UTIs on suppressive therapy with Macrobid, and chronic pain who was admitted 12/28/15 upon the advice of her ophthalmologist to come here for evaluation of recurrent self-limited visual disturbance accompanied by right upper extremity numbness/tingling. Ophthalmologist reports there was no evidence of retinal artery occlusion on his exam. Initial workup in the ED including MRI negative for acute stroke.   Hospital Course by Problem:   Principal Problem:  TIA (transient ischemic attack)/cerebrovascular disease or lesion Neurology consulted on admission. Full stroke workup including carotid Dopplers, echo, hemoglobin A1c, and FLP all ordered. MRI negative for acute intracranial abnormality but did show a chronic left cerebellar infarct which was new compared to imaging done in 2010. There were no major intracranial arterial occlusions or stenoses. There was a suggestion of possible mild fibromuscular dysplasia due to mild luminal regularity involving the right  cervical ICA and right vertebral artery V3 segment. Continue aspirin. Status post PT/OT evaluations with no follow-up needed. Needs better lipid control. See below.  Active Problems:  Hyperlipidemia Has an intolerance to Lipitor with myalgias. Discussed reattempting Crestor at very low dose along with Zetia, which the patient was willing to try. Instructed her to start with 2.5 mg of Crestor daily and in 2 weeks to try to increase this to 5 mg daily.   Essential hypertension Continue nadolol. Blood pressure controlled.   Chronic pain Continue Celebrex, Zanaflex and Cymbalta.   Medical Consultants:    Neurology   Discharge Exam:   Filed Vitals:   12/30/15 0133 12/30/15 0618  BP: 120/58 121/68  Pulse: 56 61  Temp: 98 F (36.7 C) 97.5 F (36.4 C)  Resp: 16 16   Filed Vitals:   12/29/15 1749 12/29/15 2101 12/30/15 0133 12/30/15 0618  BP: 118/70 104/64 120/58 121/68  Pulse: 56 52 56 61  Temp: 98.1 F (36.7 C) 98.5 F (36.9 C) 98 F (36.7 C) 97.5 F (36.4 C)  TempSrc: Oral Oral  Oral  Resp: 14 16 16 16   Height:      Weight:      SpO2: 96% 95% 96% 97%    Gen:  NAD Cardiovascular:  RRR, No M/R/G Respiratory: Lungs CTAB Gastrointestinal: Abdomen soft, NT/ND with normal active bowel sounds. Extremities: No C/E/C Neuro: Nonfocal    The results of significant diagnostics from this hospitalization (including imaging, microbiology, ancillary and laboratory) are listed below for reference.     Procedures and Diagnostic Studies:   Ct Head Wo Contrast  12/28/2015  CLINICAL DATA:  Blurred vision, headache, right arm numbness EXAM: CT HEAD WITHOUT CONTRAST TECHNIQUE: Contiguous axial  images were obtained from the base of the skull through the vertex without intravenous contrast. COMPARISON:  11/08/2010 FINDINGS: No evidence of parenchymal hemorrhage or extra-axial fluid collection. No mass lesion, mass effect, or midline shift. No CT evidence of acute infarction.  Subcortical white matter and periventricular small vessel ischemic changes. Global cortical atrophy.  No ventriculomegaly. The visualized paranasal sinuses are essentially clear. The mastoid air cells are unopacified. No evidence of calvarial fracture. IMPRESSION: No evidence of acute intracranial abnormality. Atrophy with small vessel ischemic changes. Electronically Signed   By: Julian Hy M.D.   On: 12/28/2015 16:59   Mr brain/Angiogram Head and neck Wo Contrast  12/28/2015  CLINICAL DATA:  TIA. Right arm numbness. Several episodes of visual disturbance this past week. All symptoms have since resolved. Patch that EXAM: MRI HEAD WITHOUT AND WITH CONTRAST MRA HEAD WITHOUT CONTRAST MRA NECK WITHOUT AND WITH CONTRAST TECHNIQUE: Multiplanar, multiecho pulse sequences of the brain and surrounding structures were obtained without and with intravenous contrast. Angiographic images of the Circle of Willis were obtained using MRA technique without intravenous contrast. Angiographic images of the neck were obtained using MRA technique without and with intravenous contrast. Carotid stenosis measurements (when applicable) are obtained utilizing NASCET criteria, using the distal internal carotid diameter as the denominator. CONTRAST:  51mL MULTIHANCE GADOBENATE DIMEGLUMINE 529 MG/ML IV SOLN COMPARISON:  Head CT 12/28/2015 and MRI 02/06/2009. FINDINGS: MRI HEAD FINDINGS There is no evidence of acute infarct, intracranial hemorrhage, mass, or midline shift. Prominent CSF spaces over the anterior aspects of both frontal lobes have slightly increased from the prior MRI and are favored to reflect atrophy over subdural collections. A few small foci of T2 hyperintensity in the cerebral white matter and a small focus in the pons are nonspecific but may reflect minimal chronic small vessel ischemic disease, not greater than expected for patient's age. There is a small, chronic superior left cerebellar infarct which is new  from the prior MRI. No abnormal enhancement is identified. Orbits are unremarkable. There are trace mastoid effusions. The paranasal sinuses are clear. Major intracranial vascular flow voids are preserved. MRA HEAD FINDINGS The visualized distal vertebral arteries are patent and codominant. PICA origins are patent. AICA and SCA origins are grossly patent but suboptimally evaluated due to their small size. Basilar artery is patent without stenosis. There is a small left posterior communicating artery. PCAs are patent without evidence of significant proximal stenosis. Internal carotid arteries are patent from skullbase to carotid termini without stenosis there is likely a small infundibulum at the left posterior communicating artery origin. ACAs and MCAs are patent without evidence of major branch occlusion or significant stenosis. MRA NECK FINDINGS Three vessel aortic arch. Brachiocephalic and subclavian arteries are patent. Both proximal common carotid arteries are tortuous and suboptimally evaluated on both contrast enhanced and noncontrast time-of-flight sequences. The more distal common carotid arteries are widely patent. The internal carotid arteries are patent without stenosis. There is mild luminal irregularity/slight beading of the mid cervical ICA the right. The vertebral arteries are patent and codominant with antegrade flow bilaterally. No significant vertebral artery stenosis is identified, although there is mild luminal regularity involving the right V3 segment. IMPRESSION: 1. No acute intracranial abnormality. 2. Chronic left cerebellar infarct, new from 2010. 3. No major intracranial arterial occlusion or significant stenosis. 4. No significant arterial stenosis identified in the neck. Suboptimal evaluation of the proximal common carotid arteries. 5. Mild luminal regularity involving the right cervical ICA and right vertebral artery V3 segment,  query mild fibromuscular dysplasia. Electronically Signed    By: Logan Bores M.D.   On: 12/28/2015 21:18   Carotid Dopplers 12/30/15: 1-39 percent ICA plaquing. Vertebral artery flow antegrade.  2-D echo 12/30/15 Study Conclusions  - Left ventricle: The cavity size was normal. There was mild  concentric hypertrophy. Systolic function was normal. The  estimated ejection fraction was in the range of 60% to 65%. Wall  motion was normal; there were no regional wall motion  abnormalities. There was an increased relative contribution of  atrial contraction to ventricular filling. Doppler parameters are  consistent with abnormal left ventricular relaxation (grade 1  diastolic dysfunction). - Mitral valve: There was mild regurgitation. - Pulmonary arteries: PA peak pressure: 37 mm Hg (S).  Impressions:  - The right ventricular systolic pressure was increased consistent  with mild pulmonary hypertension.  Labs:   Basic Metabolic Panel:  Recent Labs Lab 12/28/15 1623 12/28/15 1632  NA 141 141  K 4.5 4.6  CL 102 100*  CO2 28  --   GLUCOSE 104* 99  BUN 15 20  CREATININE 0.87 0.80  CALCIUM 10.1  --    GFR Estimated Creatinine Clearance: 64.2 mL/min (by C-G formula based on Cr of 0.8). Liver Function Tests:  Recent Labs Lab 12/28/15 1623  AST 20  ALT 14  ALKPHOS 86  BILITOT 0.6  PROT 7.4  ALBUMIN 4.7   Coagulation profile  Recent Labs Lab 12/28/15 1623  INR 0.96    CBC:  Recent Labs Lab 12/28/15 1623 12/28/15 1632  WBC 7.8  --   NEUTROABS 4.3  --   HGB 12.9 15.0  HCT 40.2 44.0  MCV 95.3  --   PLT 363  --    Lipid Profile  Recent Labs  12/29/15 1214  CHOL 286*  HDL 57  LDLCALC 194*  TRIG 177*  CHOLHDL 5.0   Microbiology No results found for this or any previous visit (from the past 240 hour(s)).   Discharge Instructions:       Discharge Instructions    Call MD for:  difficulty breathing, headache or visual disturbances    Complete by:  As directed      Call MD for:    Complete by:  As  directed   Numbness, weakness, or tingling on one side of the body versus the other.     Diet - low sodium heart healthy    Complete by:  As directed      Discharge instructions    Complete by:  As directed   Take one half a tablet of Crestor daily with your evening meal. If you tolerate the medication at this dose, in 2 weeks, try to take a whole tablet. If the muscle pains come back, go back to the half tablet.  Your total cholesterol was 286 and your "bad" cholesterol was 194. The goal is less than 70.     Increase activity slowly    Complete by:  As directed             Medication List    TAKE these medications        acetaminophen 650 MG CR tablet  Commonly known as:  TYLENOL  Take 1,300 mg by mouth 4 (four) times daily as needed for pain. Tylenol Arthritis     aspirin 325 MG tablet  Take 1 tablet (325 mg total) by mouth daily.     celecoxib 100 MG capsule  Commonly known as:  CELEBREX  Take  100 mg by mouth daily as needed (leg pain).     cholecalciferol 1000 units tablet  Commonly known as:  VITAMIN D  Take 1,000 Units by mouth daily with lunch.     clonazePAM 1 MG tablet  Commonly known as:  KLONOPIN  Take 1 tablet (1 mg total) by mouth 4 (four) times daily.     conjugated estrogens vaginal cream  Commonly known as:  PREMARIN  Place 1 Applicatorful vaginally See admin instructions. Use up to twice weekly as needed to thicken vaginal wall     cyanocobalamin 1000 MCG/ML injection  Commonly known as:  (VITAMIN B-12)  Inject 1,000 mcg into the muscle See admin instructions. Vitamin B12 - last injection approx 12/07/15 - done at Dr. Shanon Brow Talbot's office ([facility], [place]) every 3 or 4 months     Vitamin B-12 5000 MCG Tbdp  Take 5,000 mcg by mouth daily with lunch.     doxepin 50 MG capsule  Commonly known as:  SINEQUAN  Take 50 mg by mouth at bedtime.     DULoxetine 60 MG capsule  Commonly known as:  CYMBALTA  Take 60 mg by mouth at bedtime.      ezetimibe 10 MG tablet  Commonly known as:  ZETIA  Take 1 tablet (10 mg total) by mouth daily.     fluticasone 50 MCG/ACT nasal spray  Commonly known as:  FLONASE  Place 1 spray into both nostrils daily.     furosemide 20 MG tablet  Commonly known as:  LASIX  Take 20 mg by mouth every Monday, Wednesday, and Friday.     hydrocortisone cream 1 %  Apply 1 application topically at bedtime as needed for itching.     HYDROmorphone 2 MG tablet  Commonly known as:  DILAUDID  Take 1-2 tablets (2-4 mg total) by mouth every 4 (four) hours as needed for severe pain.     lubiprostone 24 MCG capsule  Commonly known as:  AMITIZA  Take 1 capsule (24 mcg total) by mouth daily with breakfast.     montelukast 10 MG tablet  Commonly known as:  SINGULAIR  Take 10 mg by mouth at bedtime.     MUCINEX PO  Take 1 tablet by mouth 2 (two) times daily as needed (congestion from seasonal allergies).     nadolol 40 MG tablet  Commonly known as:  CORGARD  TAKE 1 TABLET EVERY DAY     nitrofurantoin (macrocrystal-monohydrate) 100 MG capsule  Commonly known as:  MACROBID  Take 100 mg by mouth at bedtime. Continuous course     NUVIGIL 150 MG tablet  Generic drug:  Armodafinil  TAKE 1 TABLET BY MOUTH EVERY DAY     omeprazole 40 MG capsule  Commonly known as:  PRILOSEC  Take 40 mg by mouth at bedtime.     potassium chloride 8 MEQ tablet  Commonly known as:  KLOR-CON  1 tablet daily     rosuvastatin 5 MG tablet  Commonly known as:  CRESTOR  Take 0.5 tablets (2.5 mg total) by mouth daily at 6 PM.     TESTOSTERONE IM  Inject 1 application into the muscle See admin instructions. Every 2 1/2 months at Dr. Stann Mainland at [place] office - last injection approx 1st week of March     tiZANidine 4 MG tablet  Commonly known as:  ZANAFLEX  Take 4 mg by mouth 4 (four) times daily as needed (pain).     zolpidem 10 MG  tablet  Commonly known as:  AMBIEN  Take 10 mg by mouth at bedtime.        Follow-up Information    Follow up with TALBOT, DAVID C, MD. Schedule an appointment as soon as possible for a visit in 2 weeks.   Specialty:  Internal Medicine   Why:  Hospital follow-up.   Contact information:   [address] [place] [address] [phone]        Time coordinating discharge: 30 minutes.  Signed:  RAMA,CHRISTINA  Pager [phone] [facility] 12/30/2015, 3:32 PM

## 2015-12-30 NOTE — Discharge Instructions (Signed)
Stroke Prevention Some health problems and behaviors may make it more likely for you to have a stroke. Below are ways to lessen your risk of having a stroke.   Be active for at least 30 minutes on most or all days.  Do not smoke. Try not to be around others who smoke.  Do not drink too much alcohol.  Do not have more than 2 drinks a day if you are a man.  Do not have more than 1 drink a day if you are a woman and are not pregnant.  Eat healthy foods, such as fruits and vegetables. If you were put on a specific diet, follow the diet as told.  Keep your cholesterol levels under control through diet and medicines. Look for foods that are low in saturated fat, trans fat, cholesterol, and are high in fiber.  If you have diabetes, follow all diet plans and take your medicine as told.  Ask your doctor if you need treatment to lower your blood pressure. If you have high blood pressure (hypertension), follow all diet plans and take your medicine as told by your doctor.  If you are 107-65 years old, have your blood pressure checked every 3-5 years. If you are age 5 or older, have your blood pressure checked every year.  Keep a healthy weight. Eat foods that are low in calories, salt, saturated fat, trans fat, and cholesterol.  Do not take drugs.  Avoid birth control pills, if this applies. Talk to your doctor about the risks of taking birth control pills.  Talk to your doctor if you have sleep problems (sleep apnea).  Take all medicine as told by your doctor.  You may be told to take aspirin or blood thinner medicine. Take this medicine as told by your doctor.  Understand your medicine instructions.  Make sure any other conditions you have are being taken care of. GET HELP RIGHT AWAY IF:  You suddenly lose feeling (you feel numb) or have weakness in your face, arm, or leg.  Your face or eyelid hangs down to one side.  You suddenly feel confused.  You have trouble talking (aphasia)  or understanding what people are saying.  You suddenly have trouble seeing in one or both eyes.  You suddenly have trouble walking.  You are dizzy.  You lose your balance or your movements are clumsy (uncoordinated).  You suddenly have a very bad headache and you do not know the cause.  You have new chest pain.  Your heart feels like it is fluttering or skipping a beat (irregular heartbeat). Do not wait to see if the symptoms above go away. Get help right away. Call your local emergency services (911 in [place] Do not drive yourself to the hospital.   This information is not intended to replace advice given to you by your health care provider. Make sure you discuss any questions you have with your health care provider.   Document Released: 03/16/2012 Document Revised: 10/06/2014 Document Reviewed: 03/18/2013 Elsevier Interactive Patient Education 2016 [place] Cigarette smoking nearly doubles your risk of having a stroke & is the single most alterable risk factor  If you smoke or have smoked in the last 12 months, you are advised to quit smoking for your health.  Most of the excess cardiovascular risk related to smoking disappears within a year of stopping.  Ask you doctor about anti-smoking medications  [place] Quit Line: 1-800-QUIT NOW  Free Smoking Cessation Classes [phone]  [phone]  CHOLESTEROL Know your levels; limit fat & cholesterol in your diet  Lipid Panel     Component Value Date/Time   CHOL 286* 12/29/2015 1214   TRIG 177* 12/29/2015 1214   HDL 57 12/29/2015 1214   CHOLHDL 5.0 12/29/2015 1214   VLDL 35 12/29/2015 1214   LDLCALC 194* 12/29/2015 1214      Many patients benefit from treatment even if their cholesterol is at goal.  Goal: Total Cholesterol (CHOL) less than 160  Goal:  Triglycerides (TRIG) less than 150  Goal:  HDL greater than 40  Goal:  LDL (LDLCALC) less than 100   BLOOD PRESSURE American Stroke  Association blood pressure target is less that 120/80 mm/Hg  Your discharge blood pressure is:  BP: 121/68 mmHg  Monitor your blood pressure  Limit your salt and alcohol intake  Many individuals will require more than one medication for high blood pressure  DIABETES (A1c is a blood sugar average for last 3 months) Goal HGBA1c is under 7% (HBGA1c is blood sugar average for last 3 months)  Diabetes: No known diagnosis of diabetes    Lab Results  Component Value Date   HGBA1C 5.6 11/12/2011     Your HGBA1c can be lowered with medications, healthy diet, and exercise.  Check your blood sugar as directed by your physician  Call your physician if you experience unexplained or low blood sugars.  PHYSICAL ACTIVITY/REHABILITATION Goal is 30 minutes at least 4 days per week  Activity: No restrictions. Therapies:  Return to work:   Activity decreases your risk of heart attack and stroke and makes your heart stronger.  It helps control your weight and blood pressure; helps you relax and can improve your mood.  Participate in a regular exercise program.  Talk with your doctor about the best form of exercise for you (dancing, walking, swimming, cycling).  DIET/WEIGHT Goal is to maintain a healthy weight  Your discharge diet is: Diet Heart Room service appropriate?: Yes; Fluid consistency:: Thin Diet - low sodium heart healthy   Your height is:  Height: 4\' 10"  (147.3 cm) Your current weight is: Weight: 74.6 kg (164 lb 7.4 oz) Your Body Mass Index (BMI) is:  BMI (Calculated): 34.4  Following the type of diet specifically designed for you will help prevent another stroke.  Your goal weight range is:  89-119lbs  Your goal Body Mass Index (BMI) is 19-24.  Healthy food habits can help reduce 3 risk factors for stroke:  High cholesterol, hypertension, and excess weight.  RESOURCES Stroke/Support Group:  Call [phone]   STROKE EDUCATION PROVIDED/REVIEWED AND GIVEN TO PATIENT Stroke warning  signs and symptoms How to activate emergency medical system (call 911). Medications prescribed at discharge. Need for follow-up after discharge. Personal risk factors for stroke. Pneumonia vaccine given:  Flu vaccine given: Transient Ischemic Attack A transient ischemic attack (TIA) is a "warning stroke" that causes stroke-like symptoms. Unlike a stroke, a TIA does not cause permanent damage to the brain. The symptoms of a TIA can happen very fast and do not last long. It is important to know the symptoms of a TIA and what to do. This can help prevent a major stroke or death. CAUSES  A TIA is caused by a temporary blockage in an artery in the brain or neck (carotid artery). The blockage does not allow the brain to get the blood supply it needs and can cause different symptoms. The blockage can be caused by either:  A blood clot.  Fatty buildup (plaque) in a neck or brain artery. RISK FACTORS  High blood pressure (hypertension).  High cholesterol.  Diabetes mellitus.  Heart disease.  The buildup of plaque in the blood vessels (peripheral artery disease or atherosclerosis).  The buildup of plaque in the blood vessels that provide blood and oxygen to the brain (carotid artery stenosis).  An abnormal heart rhythm (atrial fibrillation).  Obesity.  Using any tobacco products, including cigarettes, chewing tobacco, or electronic cigarettes.  Taking oral contraceptives, especially in combination with using tobacco.  Physical inactivity.  A diet high in fats, salt (sodium), and calories.  Excessive alcohol use.  Use of illegal drugs (especially cocaine and methamphetamine).  Being female.  Being African American.  Being over the age of 35 years.  Family history of stroke.  Previous history of blood clots, stroke, TIA, or heart attack.  Sickle cell disease. SIGNS AND SYMPTOMS  TIA symptoms are the same as a stroke but are temporary. These symptoms usually develop suddenly,  or may be newly present upon waking from sleep:  Sudden weakness or numbness of the face, arm, or leg, especially on one side of the body.  Sudden trouble walking or difficulty moving arms or legs.  Sudden confusion.  Sudden personality changes.  Trouble speaking (aphasia) or understanding.  Difficulty swallowing.  Sudden trouble seeing in one or both eyes.  Double vision.  Dizziness.  Loss of balance or coordination.  Sudden severe headache with no known cause.  Trouble reading or writing.  Loss of bowel or bladder control.  Loss of consciousness. DIAGNOSIS  Your health care provider may be able to determine the presence or absence of a TIA based on your symptoms, history, and physical exam. CT scan of the brain is usually performed to help identify a TIA. Other tests may include:  Electrocardiography (ECG).  Continuous heart monitoring.  Echocardiography.  Carotid ultrasonography.  MRI.  A scan of the brain circulation.  Blood tests. TREATMENT  Since the symptoms of TIA are the same as a stroke, it is important to seek treatment as soon as possible. You may need a medicine to dissolve a blood clot (thrombolytic) if that is the cause of the TIA. This medicine cannot be given if too much time has passed. Treatment may also include:   Rest, oxygen, fluids through an IV tube, and medicines to thin the blood (anticoagulants).  Measures will be taken to prevent short-term and long-term complications, including infection from breathing foreign material into the lungs (aspiration pneumonia), blood clots in the legs, and falls.  Procedures to either remove plaque in the carotid arteries or dilate carotid arteries that have narrowed due to plaque. Those procedures are:  Carotid endarterectomy.  Carotid angioplasty and stenting.  Medicines and diet may be used to address diabetes, high blood pressure, and other underlying risk factors. HOME CARE INSTRUCTIONS   Take  medicines only as directed by your health care provider. Follow the directions carefully. Medicines may be used to control risk factors for a stroke. Be sure you understand all your medicine instructions.  You may be told to take aspirin or the anticoagulant warfarin. Warfarin needs to be taken exactly as instructed.  Taking too much or too little warfarin is dangerous. Too much warfarin increases the risk of bleeding. Too little warfarin continues to allow the risk for blood clots. While taking warfarin, you will need to have regular blood tests to measure your blood clotting time. A PT blood test measures how long it  takes for blood to clot. Your PT is used to calculate another value called an INR. Your PT and INR help your health care provider to adjust your dose of warfarin. The dose can change for many reasons. It is critically important that you take warfarin exactly as prescribed.  Many foods, especially foods high in vitamin K can interfere with warfarin and affect the PT and INR. Foods high in vitamin K include spinach, kale, broccoli, cabbage, collard and turnip greens, Brussels sprouts, peas, cauliflower, seaweed, and parsley, as well as beef and pork liver, green tea, and soybean oil. You should eat a consistent amount of foods high in vitamin K. Avoid major changes in your diet, or notify your health care provider before changing your diet. Arrange a visit with a dietitian to answer your questions.  Many medicines can interfere with warfarin and affect the PT and INR. You must tell your health care provider about any and all medicines you take; this includes all vitamins and supplements. Be especially cautious with aspirin and anti-inflammatory medicines. Do not take or discontinue any prescribed or over-the-counter medicine except on the advice of your health care provider or pharmacist.  Warfarin can have side effects, such as excessive bruising or bleeding. You will need to hold pressure  over cuts for longer than usual. Your health care provider or pharmacist will discuss other potential side effects.  Avoid sports or activities that may cause injury or bleeding.  Be careful when shaving, flossing your teeth, or handling sharp objects.  Alcohol can change the body's ability to handle warfarin. It is best to avoid alcoholic drinks or consume only very small amounts while taking warfarin. Notify your health care provider if you change your alcohol intake.  Notify your dentist or other health care providers before procedures.  Eat a diet that includes 5 or more servings of fruits and vegetables each day. This may reduce the risk of stroke. Certain diets may be prescribed to address high blood pressure, high cholesterol, diabetes, or obesity.  A diet low in sodium, saturated fat, trans fat, and cholesterol is recommended to manage high blood pressure.  A diet low in saturated fat, trans fat, and cholesterol, and high in fiber may control cholesterol levels.  A controlled-carbohydrate, controlled-sugar diet is recommended to manage diabetes.  A reduced-calorie diet that is low in sodium, saturated fat, trans fat, and cholesterol is recommended to manage obesity.  Maintain a healthy weight.  Stay physically active. It is recommended that you get at least 30 minutes of activity on most or all days.  Do not use any tobacco products, including cigarettes, chewing tobacco, or electronic cigarettes. If you need help quitting, ask your health care provider.  Limit alcohol intake to no more than 1 drink per day for nonpregnant women and 2 drinks per day for men. One drink equals 12 ounces of beer, 5 ounces of wine, or 1 ounces of hard liquor.  Do not abuse drugs.  A safe home environment is important to reduce the risk of falls. Your health care provider may arrange for specialists to evaluate your home. Having grab bars in the bedroom and bathroom is often important. Your health  care provider may arrange for equipment to be used at home, such as raised toilets and a seat for the shower.  Follow all instructions for follow-up with your health care provider. This is very important. This includes any referrals and lab tests. Proper follow-up can prevent a stroke or another TIA  from occurring. PREVENTION  The risk of a TIA can be decreased by appropriately treating high blood pressure, high cholesterol, diabetes, heart disease, and obesity, and by quitting smoking, limiting alcohol, and staying physically active. SEEK MEDICAL CARE IF:  You have personality changes.  You have difficulty swallowing.  You are seeing double.  You have dizziness.  You have a fever. SEEK IMMEDIATE MEDICAL CARE IF:  Any of the following symptoms may represent a serious problem that is an emergency. Do not wait to see if the symptoms will go away. Get medical help right away. Call your local emergency services (911 in [place] Do not drive yourself to the hospital.  You have sudden weakness or numbness of the face, arm, or leg, especially on one side of the body.  You have sudden trouble walking or difficulty moving arms or legs.  You have sudden confusion.  You have trouble speaking (aphasia) or understanding.  You have sudden trouble seeing in one or both eyes.  You have a loss of balance or coordination.  You have a sudden, severe headache with no known cause.  You have new chest pain or an irregular heartbeat.  You have a partial or total loss of consciousness. MAKE SURE YOU:   Understand these instructions.  Will watch your condition.  Will get help right away if you are not doing well or get worse.   This information is not intended to replace advice given to you by your health care provider. Make sure you discuss any questions you have with your health care provider.   Document Released: 06/25/2005 Document Revised: 10/06/2014 Document Reviewed: 12/21/2013 Elsevier  Interactive Patient Education [employer].  My questions have been answered, the writing is legible, and I understand these instructions.  I will adhere to these goals & educational materials that have been provided to me after my discharge from the hospital.    Fat and Cholesterol Restricted Diet Getting too much fat and cholesterol in your diet may cause health problems. Following this diet helps keep your fat and cholesterol at normal levels. This can keep you from getting sick. WHAT TYPES OF FAT SHOULD I CHOOSE?  Choose monosaturated and polyunsaturated fats. These are found in foods such as olive oil, canola oil, flaxseeds, walnuts, almonds, and seeds.  Eat more omega-3 fats. Good choices include salmon, mackerel, sardines, tuna, flaxseed oil, and ground flaxseeds.  Limit saturated fats. These are in animal products such as meats, butter, and cream. They can also be in plant products such as palm oil, palm kernel oil, and coconut oil.   Avoid foods with partially hydrogenated oils in them. These contain trans fats. Examples of foods that have trans fats are stick margarine, some tub margarines, cookies, crackers, and other baked goods. WHAT GENERAL GUIDELINES DO I NEED TO FOLLOW?   Check food labels. Look for the words "trans fat" and "saturated fat."  When preparing a meal:  Fill half of your plate with vegetables and green salads.  Fill one fourth of your plate with whole grains. Look for the word "whole" as the first word in the ingredient list.  Fill one fourth of your plate with lean protein foods.  Limit fruit to two servings a day. Choose fruit instead of juice.  Eat more foods with soluble fiber. Examples of foods with this type of fiber are apples, broccoli, carrots, beans, peas, and barley. Try to get 20-30 g (grams) of fiber per day.  Eat more home-cooked foods. Eat  less at restaurants and buffets.  Limit or avoid alcohol.  Limit foods high in starch and  sugar.  Limit fried foods.  Cook foods without frying them. Baking, boiling, grilling, and broiling are all great options.  Lose weight if you are overweight. Losing even a small amount of weight can help your overall health. It can also help prevent diseases such as diabetes and heart disease. WHAT FOODS CAN I EAT? Grains Whole grains, such as whole wheat or whole grain breads, crackers, cereals, and pasta. Unsweetened oatmeal, bulgur, barley, quinoa, or brown rice. Corn or whole wheat flour tortillas. Vegetables Fresh or frozen vegetables (raw, steamed, roasted, or grilled). Green salads. Fruits All fresh, canned (in natural juice), or frozen fruits. Meat and Other Protein Products Ground beef (85% or leaner), grass-fed beef, or beef trimmed of fat. Skinless chicken or [place]. Ground chicken or [place]. Pork trimmed of fat. All fish and seafood. Eggs. Dried beans, peas, or lentils. Unsalted nuts or seeds. Unsalted canned or dry beans. Dairy Low-fat dairy products, such as skim or 1% milk, 2% or reduced-fat cheeses, low-fat ricotta or cottage cheese, or plain low-fat yogurt. Fats and Oils Tub margarines without trans fats. Light or reduced-fat mayonnaise and salad dressings. Avocado. Olive, canola, sesame, or safflower oils. Natural peanut or almond butter (choose ones without added sugar and oil). The items listed above may not be a complete list of recommended foods or beverages. Contact your dietitian for more options. WHAT FOODS ARE NOT RECOMMENDED? Grains White bread. White pasta. White rice. Cornbread. Bagels, pastries, and croissants. Crackers that contain trans fat. Vegetables White potatoes. Corn. Creamed or fried vegetables. Vegetables in a cheese sauce. Fruits Dried fruits. Canned fruit in light or heavy syrup. Fruit juice. Meat and Other Protein Products Fatty cuts of meat. Ribs, chicken wings, bacon, sausage, bologna, salami, chitterlings, fatback, hot dogs, bratwurst, and  packaged luncheon meats. Liver and organ meats. Dairy Whole or 2% milk, cream, half-and-half, and cream cheese. Whole milk cheeses. Whole-fat or sweetened yogurt. Full-fat cheeses. Nondairy creamers and whipped toppings. Processed cheese, cheese spreads, or cheese curds. Sweets and Desserts Corn syrup, sugars, honey, and molasses. Candy. Jam and jelly. Syrup. Sweetened cereals. Cookies, pies, cakes, donuts, muffins, and ice cream. Fats and Oils Butter, stick margarine, lard, shortening, ghee, or bacon fat. Coconut, palm kernel, or palm oils. Beverages Alcohol. Sweetened drinks (such as sodas, lemonade, and fruit drinks or punches). The items listed above may not be a complete list of foods and beverages to avoid. Contact your dietitian for more information.   This information is not intended to replace advice given to you by your health care provider. Make sure you discuss any questions you have with your health care provider.   Document Released: 03/16/2012 Document Revised: 10/06/2014 Document Reviewed: 12/15/2013 Elsevier Interactive Patient Education [employer].

## 2015-12-30 NOTE — Progress Notes (Addendum)
STROKE TEAM PROGRESS NOTE   HISTORY OF PRESENT ILLNESS Kristin Pope is a 60 y.o. female history of multiple medical problems who presents with several complaints. The first is visual changes which she describes as noticing that she can't see her hand in an area of her vision that she is typically able to see it. This has happened twice, the first time lasted at least 45 minutes and then she went to bed. The second happened today shortly after having her eyes dilated at the ophthalmologist and lasted approximately 30 minutes.  She also complains of intermittent right arm numbness. She states that when this happens it occurs suddenly, there is no progression of symptoms, no paresthesia, and no association with headache. This has happened 4 times over the past month.  She does have chronic headaches. She denies a history of migraines, however on further history she used to get headaches in the 80s which were associated with photophobia. More recently over the past year and a half she has been having increasing severity in her headaches. She does have a headache today with photophobia.   SUBJECTIVE (INTERVAL HISTORY) No family members present. The patient believes she may have had a stroke in the past. She reports that she has a strong family history of vascular disease. As well as migraines. MRI scan of the brain personally reviewed shows no acute infarct  She feels fine today. She reports echo just completed. No overnight events.   OBJECTIVE Temp:  [97.5 F (36.4 C)-98.5 F (36.9 C)] 97.5 F (36.4 C) (04/02 0618) Pulse Rate:  [50-61] 61 (04/02 0618) Cardiac Rhythm:  [-] Normal sinus rhythm (04/02 0412) Resp:  [12-16] 16 (04/02 0618) BP: (104-129)/(53-70) 121/68 mmHg (04/02 0618) SpO2:  [91 %-97 %] 97 % (04/02 0618)  CBC:   Recent Labs Lab 12/28/15 1623 12/28/15 1632  WBC 7.8  --   NEUTROABS 4.3  --   HGB 12.9 15.0  HCT 40.2 44.0  MCV 95.3  --   PLT 363  --     Basic  Metabolic Panel:   Recent Labs Lab 12/28/15 1623 12/28/15 1632  NA 141 141  K 4.5 4.6  CL 102 100*  CO2 28  --   GLUCOSE 104* 99  BUN 15 20  CREATININE 0.87 0.80  CALCIUM 10.1  --     Lipid Panel:     Component Value Date/Time   CHOL 286* 12/29/2015 1214   TRIG 177* 12/29/2015 1214   HDL 57 12/29/2015 1214   CHOLHDL 5.0 12/29/2015 1214   VLDL 35 12/29/2015 1214   LDLCALC 194* 12/29/2015 1214   HgbA1c:  Lab Results  Component Value Date   HGBA1C 5.6 11/12/2011   Urine Drug Screen: No results found for: LABOPIA, COCAINSCRNUR, LABBENZ, AMPHETMU, THCU, LABBARB      IMAGING  Ct Head Wo Contrast 12/28/2015   No evidence of acute intracranial abnormality. Atrophy with small vessel ischemic changes.    Mr Angiogram Head and Neck Wo Contrast 12/28/2015   1. No acute intracranial abnormality.  2. Chronic left cerebellar infarct, new from 2010.  3. No major intracranial arterial occlusion or significant stenosis.  4. No significant arterial stenosis identified in the neck. Suboptimal evaluation of the proximal common carotid arteries.  5. Mild luminal regularity involving the right cervical ICA and right vertebral artery V3 segment, query mild fibromuscular dysplasia.    Mr Kizzie Fantasia Contrast 12/28/2015   1. No acute intracranial abnormality.  2. Chronic left cerebellar  infarct, new from 2010.  3. No major intracranial arterial occlusion or significant stenosis.  4. No significant arterial stenosis identified in the neck. Suboptimal evaluation of the proximal common carotid arteries.  5. Mild luminal regularity involving the right cervical ICA and right vertebral artery V3 segment, query mild fibromuscular dysplasia.     PHYSICAL EXAM Pleasant middle-aged Caucasian lady currently not in distress. . Afebrile. Head is nontraumatic. Neck is supple without bruit.    Cardiac exam no murmur or gallop. Lungs are clear to auscultation. Distal pulses are well  felt.  Neurological Exam ;  Awake  Alert oriented x 3. Normal speech and language.eye movements full without nystagmus.fundi were not visualized. Vision acuity and fields appear normal. Hearing is normal. Palatal movements are normal. Face symmetric. Tongue midline. Normal strength, tone, reflexes and coordination. Normal sensation. Gait deferred.     ASSESSMENT/PLAN [name] is a 60 y.o. female with history of hypertension, hyperlipidemia, chronic fatigue, reactive airway disease, sleep apnea, borderline diabetes, and possibly migraine headaches presenting with intermittent right arm numbness, visual changes, and headache with photophobia.  She did not receive IV t-PA due to mild transient deficits.   Possible TIA:  Dominant   Resultant  Resolution of deficits.  MRI - No acute intracranial abnormality but chronic left cerebellar infarct, new from 2010.  MRA - no significant abnormality but query mild fibromuscular dysplasia.   Carotid Doppler Preliminary report: 1-39% ICA plaquing. Vertebral artery flow is antegrade.   2D Echo pending  LDL 194  HgbA1c pending  VTE prophylaxis - will order SCDs Diet Heart Room service appropriate?: Yes; Fluid consistency:: Thin  No antithrombotic prior to admission, now on aspirin 325 mg daily  Patient counseled to be compliant with her antithrombotic medications  Ongoing aggressive stroke risk factor management  Therapy recommendations: No PT or OT follow up  Disposition:  Pending  Hypertension  Blood pressure frequently low. The patient was on Corgard 40 mg daily prior to admission and currently.   Hyperlipidemia  Home meds: No lipid lowering medications prior to admission.  LDL 194, goal < 70  Would recommend Lipitor 80mg  qhs but patient has allergy listed to statin. Recommend discussion with patient and possibly suggesting  new PCSK9 inhibitor like Praluent. Will start Zetia.     Other Stroke Risk  Factors  Advanced age  Cigarette smoker, quit smoking   Obesity, Body mass index is 34.38 kg/(m^2).   Hx stroke by MRI.  Family hx stroke (maternal grandfather)  Migraines  Obstructive sleep apnea  Other Active Problems  History of borderline diabetes   PLAN  Finish workup   Hospital day #   Mikey Bussing PA-C [facility] Pager [phone] 12/30/2015, 8:16 AM  I have personally examined this patient, reviewed notes, independently viewed imaging studies, participated in medical decision making and plan of care. I have made any additions or clarifications directly to the above note. Agree with note above. Albrecht status. She presented with recurrent blurred vision with headache and numbness etiology indeterminate possible posterior circulation TIA versus complicated migraine. Seizures less likely. She remains at risk for neurological worsening, recurrent stroke, TIA and needs ongoing evaluation and aggressive risk factor control.   Sarina Ill, MD Stroke Neurology [phone] [facility]    To contact Stroke Continuity provider, please refer to [email or web address] After hours, contact General Neurology

## 2015-12-30 NOTE — Progress Notes (Signed)
VASCULAR LAB PRELIMINARY  PRELIMINARY  PRELIMINARY  PRELIMINARY  Carotid duplex completed.    Preliminary report:  1-39% ICA plaquing.  Vertebral artery flow is antegrade.   Latarsha Zani, RVT 12/30/2015, 8:58 AM

## 2015-12-31 LAB — HEMOGLOBIN A1C
HEMOGLOBIN A1C: 5.7 % — AB (ref 4.8–5.6)
MEAN PLASMA GLUCOSE: 117 mg/dL

## 2016-01-30 ENCOUNTER — Other Ambulatory Visit: Payer: Self-pay | Admitting: Internal Medicine

## 2017-01-19 IMAGING — DX DG HIP (WITH OR WITHOUT PELVIS) 1V PORT*L*
2 series · 2 of 2 positions shown · non-contrast
Comparison: None.

CLINICAL DATA: Left hip replacement

EXAM:
DG HIP (WITH OR WITHOUT PELVIS) 1V PORT LEFT

[pelvis ap]
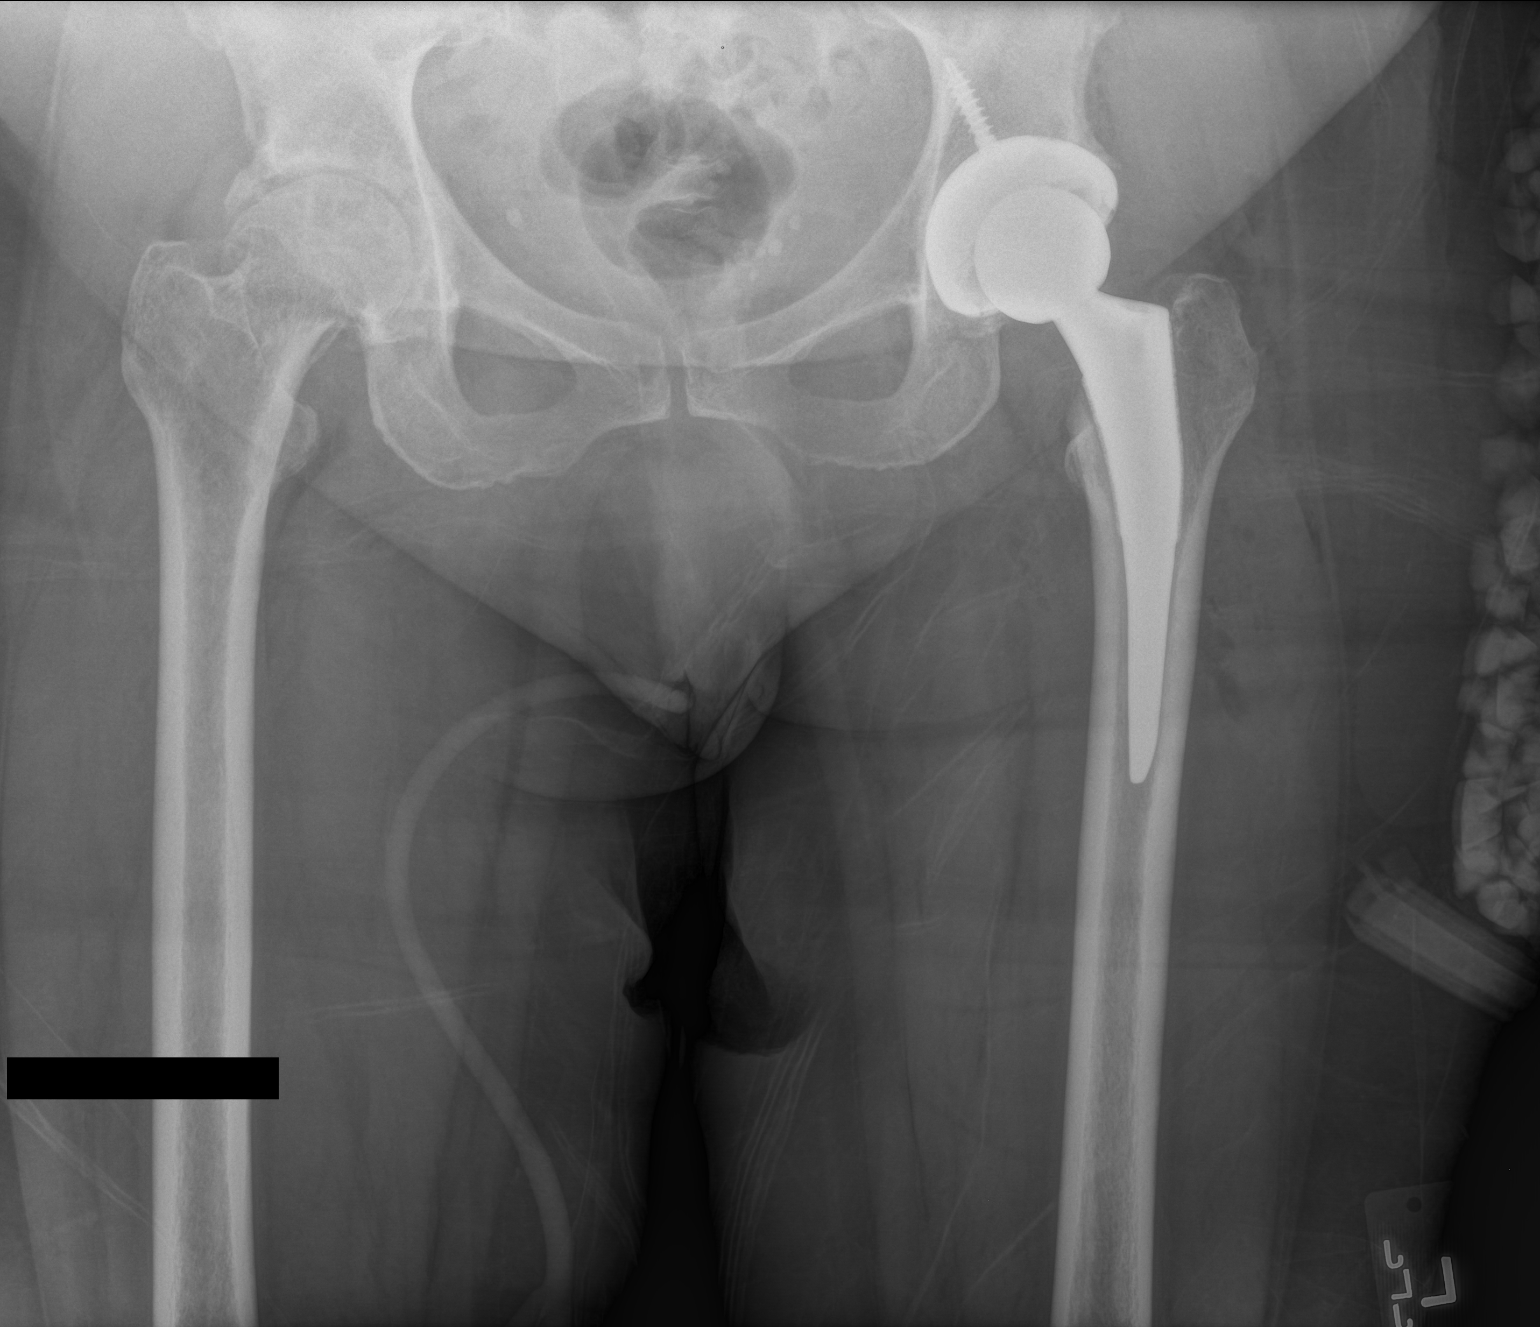

[hip x-table]
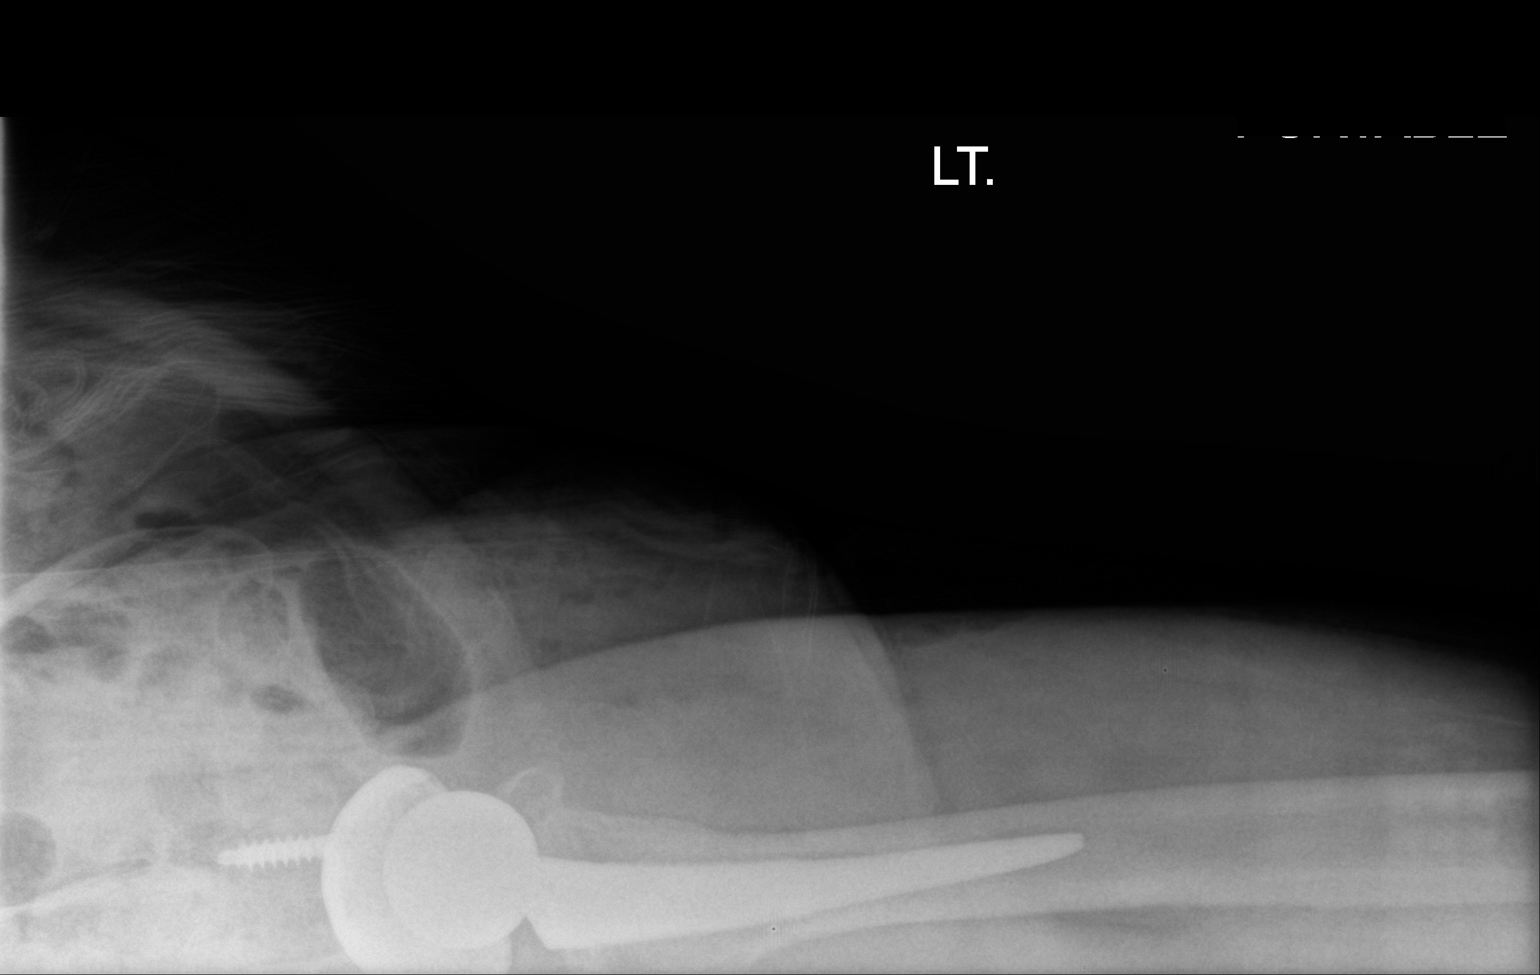

[2 of 2 positions shown; findings below may reference images not displayed]

FINDINGS: Limited lateral view due to partial exclusion of the acetabular cup.

New total left hip arthroplasty without periprosthetic fracture or
dislocation. The prosthesis appears well seated.

Advanced right hip osteoarthritis with joint narrowing and ring
osteophytes.
IMPRESSION: No acute finding after left hip arthroplasty.

## 2017-01-29 DIAGNOSIS — M8589 Other specified disorders of bone density and structure, multiple sites: Secondary | ICD-10-CM | POA: Insufficient documentation

## 2017-01-29 HISTORY — DX: Other specified disorders of bone density and structure, multiple sites: M85.89

## 2017-02-23 IMAGING — DX DG HIP (WITH OR WITHOUT PELVIS) 1V PORT*R*
2 series · 2 of 2 positions shown · non-contrast
Comparison: 07/17/2015.

CLINICAL DATA: 59-year-old female post right hip surgery.
Subsequent encounter.

EXAM:
DG HIP (WITH OR WITHOUT PELVIS) 1V PORT RIGHT

[hip x-table]
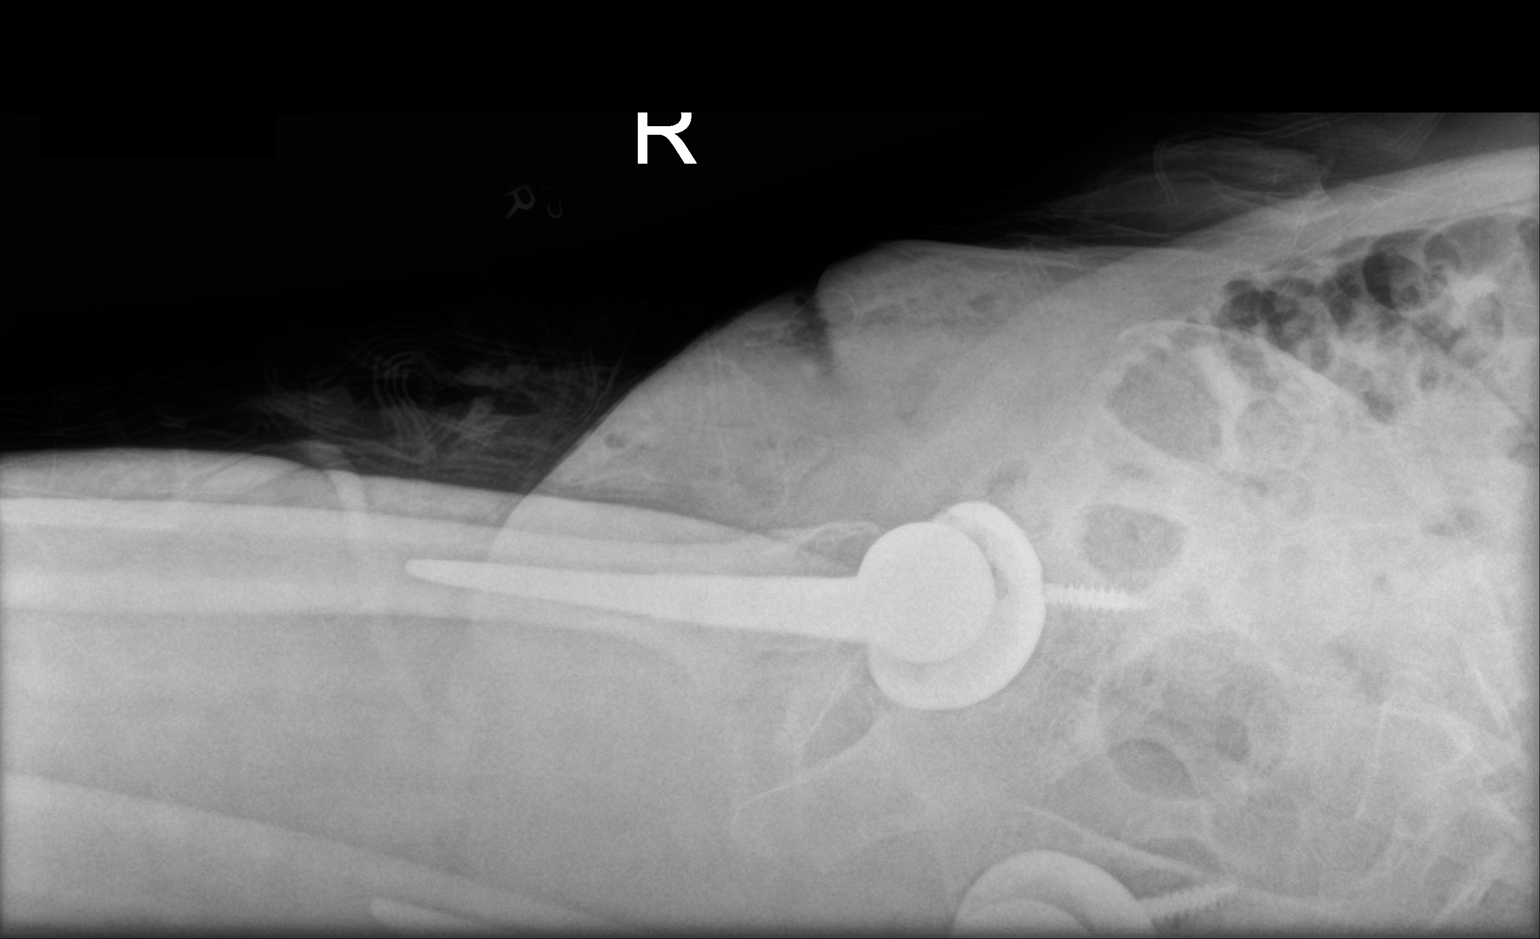

[pelvis ap]
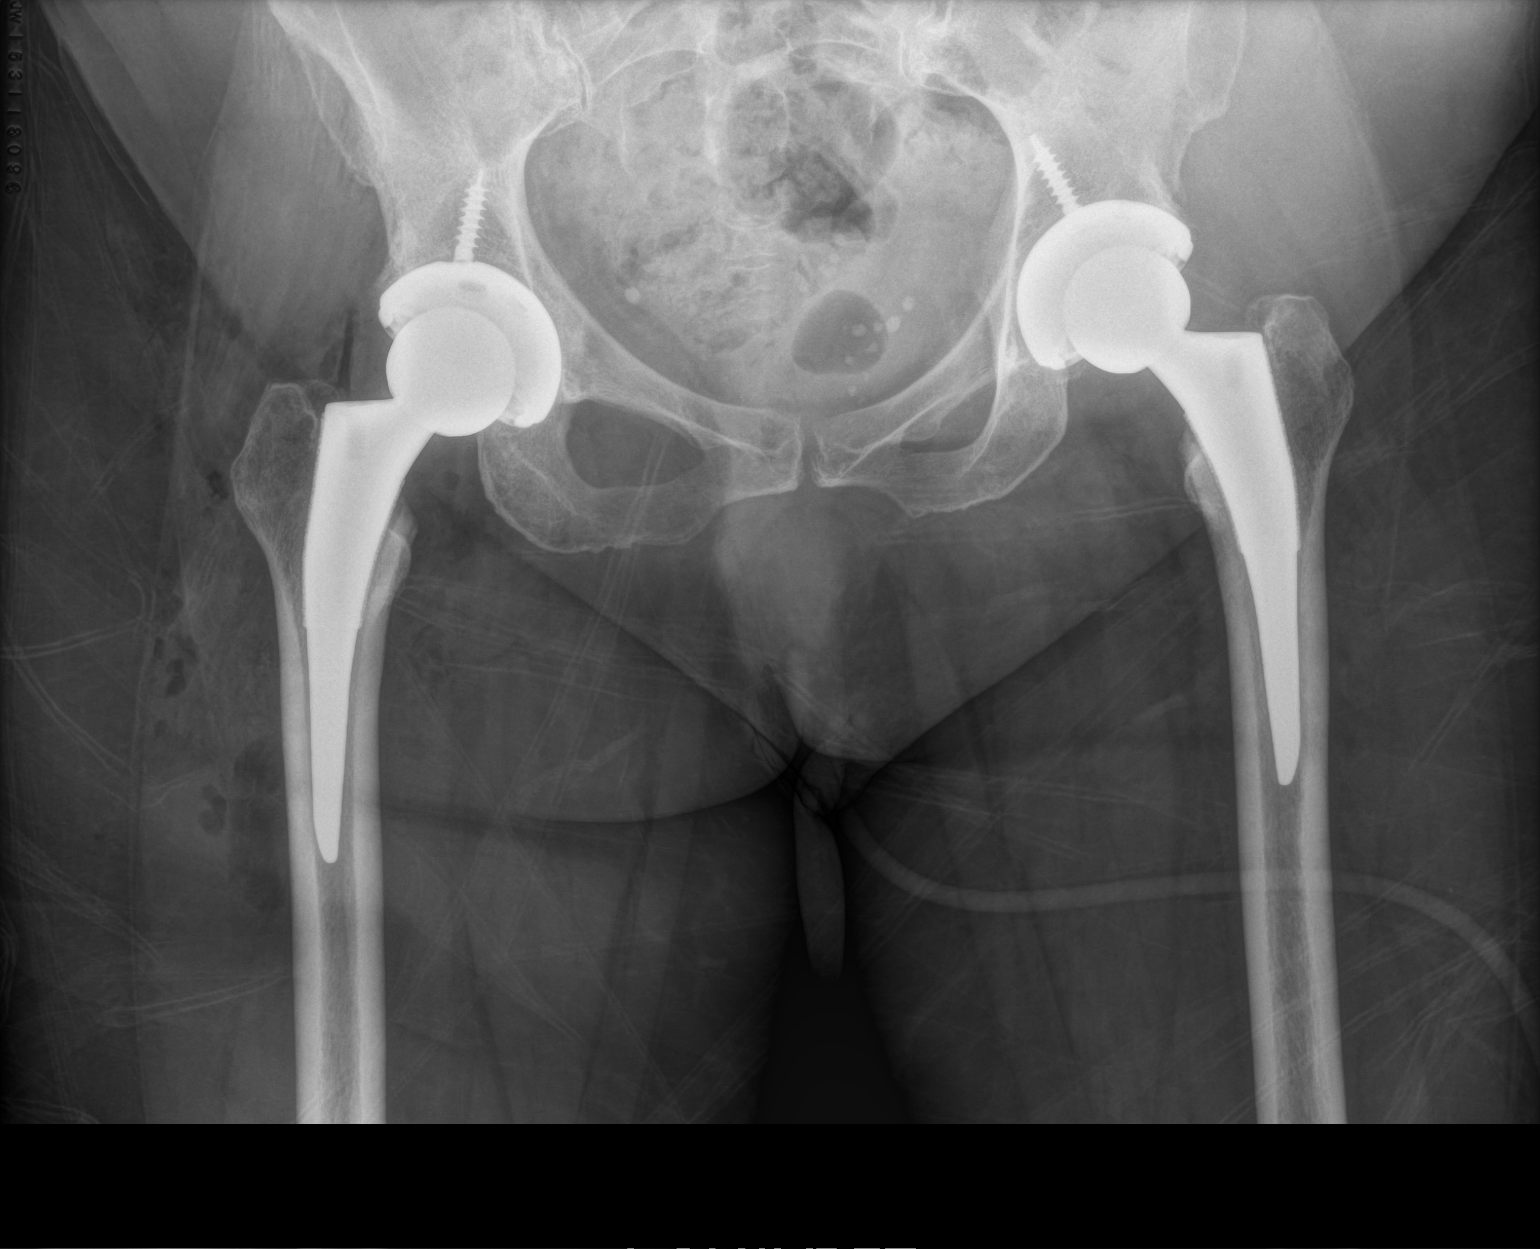

[2 of 2 positions shown; findings below may reference images not displayed]

FINDINGS: Total right hip replacement appears in satisfactory position without
complication noted.

Remote total left hip replacement.
IMPRESSION: Total right hip replacement appears in satisfactory position without
complication noted.

Remote total left hip replacement.

## 2017-05-13 ENCOUNTER — Ambulatory Visit: Payer: Self-pay | Admitting: Orthopedic Surgery

## 2017-05-20 ENCOUNTER — Ambulatory Visit (HOSPITAL_BASED_OUTPATIENT_CLINIC_OR_DEPARTMENT_OTHER): Payer: Medicaid Other | Admitting: Genetic Counselor

## 2017-05-20 ENCOUNTER — Encounter: Payer: Self-pay | Admitting: Genetic Counselor

## 2017-05-20 ENCOUNTER — Other Ambulatory Visit: Payer: Medicaid Other

## 2017-05-20 DIAGNOSIS — Z803 Family history of malignant neoplasm of breast: Secondary | ICD-10-CM | POA: Diagnosis not present

## 2017-05-20 DIAGNOSIS — Z808 Family history of malignant neoplasm of other organs or systems: Secondary | ICD-10-CM

## 2017-05-20 DIAGNOSIS — Z315 Encounter for genetic counseling: Secondary | ICD-10-CM | POA: Diagnosis not present

## 2017-05-20 DIAGNOSIS — Z801 Family history of malignant neoplasm of trachea, bronchus and lung: Secondary | ICD-10-CM

## 2017-05-20 NOTE — Progress Notes (Signed)
REFERRING PROVIDER: Jonathon Bellows, PA-C [phone] [facility] [place], [place] [address]  PRIMARY PROVIDER:  Marijean Bravo, MD  PRIMARY REASON FOR VISIT:  1. Family history of breast cancer      HISTORY OF PRESENT ILLNESS:   Kristin Pope, a 61 y.o. female, was seen for a [facility] cancer genetics consultation at the request of Jossie Ng, PA-C due to a family history of cancer.  Kristin Pope presents to clinic today to discuss the possibility of a hereditary predisposition to cancer, genetic testing, and to further clarify her future cancer risks, as well as potential cancer risks for family members. Kristin Pope is a 61 y.o. female with no personal history of cancer.  She has a strong family history of breast cancer, and her sister and niece were found to carry a mutation in the ATM gene.  CANCER HISTORY:   No history exists.     HORMONAL RISK FACTORS:  Menarche was at age 33.  First live birth at age N/A.  OCP use for approximately 35 years.  Ovaries intact: yes.  Hysterectomy: no.  Menopausal status: postmenopausal.  HRT use: 0 years. Colonoscopy: yes; a few polyps. Mammogram within the last year: yes. Number of breast biopsies: 1. Up to date with pelvic exams:  yes. Any excessive radiation exposure in the past:  no  Past Medical History:  Diagnosis Date  . Abnormal brain MRI    per pt   . Allergy   . Anemia, pernicious   . Anxiety   . Arthritis    neck and spine, with bone spurs  . Borderline diabetes    no meds  . Cerebrovascular disease or lesion 12/30/2015  . Chronic fatigue   . DDD (degenerative disc disease), cervical    ALSO LUMBAR AREA  . Depression   . 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   . Diverticulitis 2011   HAD BOWEL PERFORATION  . DIVERTICULITIS, COLON, WITH PERFORATION 08/27/2010   Annotation: microperforation Qualifier: Diagnosis of  By: Arnoldo Morale MD, Balinda Quails   . Family history of adverse reaction to anesthesia    sister has problems waking up  . Family history of breast cancer   . Fibromyalgia   . FIBROMYALGIA 03/29/2007   Qualifier: Diagnosis of  By: Tiney Rouge CMA, Ellison Hughs    . GERD (gastroesophageal reflux disease) 07/23/2015  . Headache    constant headaches  . Hx of pyelonephritis 2007  . Hx: UTI (urinary tract infection)   . Hyperlipidemia   . Hypertension   . Low back pain   . Mild pulmonary hypertension ([place]) 12/30/2015  . MVP (mitral valve prolapse)   . Reactive airway disease   . Sleep apnea    does not use sleep - was told did not need cpap  . TIA (transient ischemic attack) 12/28/2015  . Torn rotator cuff    right shoulder  . Unspecified hypothyroidism 05/16/2009   Qualifier: Diagnosis of  By: Arnoldo Morale MD, Balinda Quails   . Vaginitis    atropic-ongoing abnormal vaginal bleeding-had ultrasound and biopsy in last couple months    Past Surgical History:  Procedure Laterality Date  . BREAST SURGERY     breast biopsy-benign  . COLONOSCOPY W/ POLYPECTOMY    . DILATION AND CURETTAGE OF UTERUS    . HYSTEROSCOPY  01/2011  . RADIAL OPTIC NEUROTOMY     twice in lumbar area of back-every 6 months  .  TOTAL HIP ARTHROPLASTY Left 07/17/2015   Procedure: LEFT TOTAL HIP ARTHROPLASTY ANTERIOR APPROACH;  Surgeon: Paralee Cancel, MD;  Location: [facility];  Service: Orthopedics;  Laterality: Left;  . TOTAL HIP ARTHROPLASTY Right 08/21/2015   Procedure: RIGHT TOTAL HIP ARTHROPLASTY ANTERIOR APPROACH;  Surgeon: Paralee Cancel, MD;  Location: [facility];  Service: Orthopedics;  Laterality: Right;    Social History   Social History  . Marital status: Divorced    Spouse name: N/A  . Number of children: N/A  . Years of education: N/A   Occupational History  . retired Unemployed   Social History Main Topics  . Smoking status: Former Smoker    Quit date: 09/29/1984  . Smokeless tobacco: Not on file  . Alcohol use Yes     Comment: wine occassionally  .  Drug use: Yes    Types: Marijuana     Comment: occassionally  . Sexual activity: Not Currently   Other Topics Concern  . Not on file   Social History Narrative  . No narrative on file     FAMILY HISTORY:  We obtained a detailed, 4-generation family history.  Significant diagnoses are listed below: Family History  Problem Relation Age of Onset  . Heart disease Mother   . Heart attack Mother   . Asthma Mother   . Breast cancer Mother 34  . Other Father        car accident  . Breast cancer Sister 64  . Lung cancer Sister   . Other Sister        ATM mutation  . Heart attack Brother 31  . Coronary artery disease Brother   . Breast cancer Maternal Grandmother   . Rheum arthritis Maternal Grandmother   . Stroke Maternal Grandfather   . Brain cancer Paternal Grandfather   . Lung cancer Paternal Grandfather   . Breast cancer Maternal Aunt 34  . Alzheimer's disease Paternal Grandmother   . Testicular cancer Cousin 28  . Other Other        ATM mutation    The patient does not have children.  She has a sister who was diagnosed with breast cancer at 20 and underwent genetic testing and was found to carry an ATM mutation.  This sister has a daughter who also has an ATM mutation.  The patient has one brother who is deceased from a heart attack.    The patient's mother developed breast cancer at 56.  She has three sisters and a brother.  One sister developed breast cancer at 57.  The patient's maternal grandparents are deceased.  Her grandfather had a heart attack.  His mother also had breast cancer.  The patient's father died in a car accident at 79.  He had two sisters, one who died at birth and the other who died within the last year of non cancer related issues.  The paternal grandparents are deceased.  The grandfather died of multiple cancers, including lung, brain and bone cancer.  The grandmother died from complications of Alzheimer's disease.  Patient's maternal ancestors are  of [place] descent, and paternal ancestors are of [place] descent. There is no reported Ashkenazi Jewish ancestry. There is no known consanguinity.  GENETIC COUNSELING ASSESSMENT: Kristin Pope is a 61 y.o. 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 We, therefore, discussed and recommended the following at today's visit.   DISCUSSION: We  discussed that about 5-10% of breast cancer is hereditary with most cases due to BRCA mutation.  The patient's sister and niece were found to have an ATM mutation.  ATM is associated with breast, pancreatic and prostate cancer primarily.  According to NCCN guidelines, there could be an increased incidence of ovarian cancer, but it is not high enough to recommend BSO.  The laboratory who tested the patient's niece will offer single gene testing for all of her relatives.  Therefore, we can test this patient for free.  We reviewed the characteristics, features and inheritance patterns of hereditary cancer syndromes. We also discussed genetic testing, including the appropriate family members to test, the process of testing, insurance coverage and turn-around-time for results. We discussed the implications of a negative, positive and/or variant of uncertain significant result. We recommended Kristin Pope pursue genetic testing for the ATM gene to look for the known family mutation.  Marland Kitchen   PLAN: After considering the risks, benefits, and limitations, Kristin Pope  provided informed consent to pursue genetic testing and the blood sample was sent to [facility] for analysis of the ATM gene. Results should be available within approximately 2-3 weeks' time, at which point they will be disclosed by telephone to Kristin Pope, as will any additional recommendations warranted by these results. Kristin Pope will receive a summary of her genetic counseling visit  and a copy of her results once available. This information will also be available in Epic. We encouraged Kristin Pope to remain in contact with cancer genetics annually so that we can continuously update the family history and inform her of any changes in cancer genetics and testing that may be of benefit for her family. Kristin Pope's questions were answered to her satisfaction today. Our contact information was provided should additional questions or concerns arise.  Based on Kristin Pope's family history, we recommended her mother, who was diagnosed with breast cancer at age 31, have genetic counseling and testing. Kristin Pope will let us know if we can be of any assistance in coordinating genetic counseling and/or testing for this family member.   Lastly, we encouraged Kristin Pope to remain in contact with cancer genetics annually so that we can continuously update the family history and inform her of any changes in cancer genetics and testing that may be of benefit for this family.   Ms.  Pope's questions were answered to her satisfaction today. Our contact information was provided should additional questions or concerns arise. Thank you for the referral and allowing [place] to share in the care of your patient.   Karen P. Florene Glen, [place], [facility] Certified Genetic Counselor [email or web address] phone: [phone]  The patient was seen for a total of 30 minutes in face-to-face genetic counseling.  This patient was discussed with Drs. Magrinat, Lindi Adie and/or Burr Medico who agrees with the above.    _______________________________________________________________________ For Office Staff:  Number of people involved in session: 1 Was an Intern/ student involved with case: yes: Rise Paganini

## 2017-05-22 DIAGNOSIS — D75839 Thrombocytosis, unspecified: Secondary | ICD-10-CM

## 2017-05-22 HISTORY — DX: Thrombocytosis, unspecified: D75.839

## 2017-05-29 ENCOUNTER — Telehealth: Payer: Self-pay | Admitting: Genetic Counselor

## 2017-05-29 ENCOUNTER — Encounter: Payer: Self-pay | Admitting: Genetic Counselor

## 2017-05-29 DIAGNOSIS — Z1379 Encounter for other screening for genetic and chromosomal anomalies: Secondary | ICD-10-CM | POA: Insufficient documentation

## 2017-05-29 NOTE — Telephone Encounter (Signed)
LM on VM that results are back. Asked that she CB.

## 2017-05-29 NOTE — Telephone Encounter (Signed)
Revealed negative genetic test results.  Recommended that her mother consider testing to confirm the side of the family that it is coming from.

## 2017-06-05 ENCOUNTER — Ambulatory Visit: Payer: Self-pay | Admitting: Genetic Counselor

## 2017-06-05 DIAGNOSIS — Z803 Family history of malignant neoplasm of breast: Secondary | ICD-10-CM

## 2017-06-05 DIAGNOSIS — Z1379 Encounter for other screening for genetic and chromosomal anomalies: Secondary | ICD-10-CM

## 2017-06-05 NOTE — Progress Notes (Signed)
HPI: Ms. Maddy was previously seen in the [facility] due to a family history of cancer, a known family mutation in ATM, and concerns regarding a hereditary predisposition to cancer. Please refer to our prior cancer genetics clinic note for more information regarding Ms. Blixt's medical, social and family histories, and our assessment and recommendations, at the time. Ms. Suminski's recent genetic test results were disclosed to her, as were recommendations warranted by these results. These results and recommendations are discussed in more detail below.  CANCER HISTORY:   No history exists.    FAMILY HISTORY:  We obtained a detailed, 4-generation family history.  Significant diagnoses are listed below: Family History  Problem Relation Age of Onset  . Heart disease Mother   . Heart attack Mother   . Asthma Mother   . Breast cancer Mother 40  . Other Father        car accident  . Breast cancer Sister 50  . Lung cancer Sister   . Other Sister        ATM mutation  . Heart attack Brother 44  . Coronary artery disease Brother   . Breast cancer Maternal Grandmother   . Rheum arthritis Maternal Grandmother   . Stroke Maternal Grandfather   . Brain cancer Paternal Grandfather   . Lung cancer Paternal Grandfather   . Breast cancer Maternal Aunt 34  . Alzheimer's disease Paternal Grandmother   . Testicular cancer Cousin 28  . Other Other        ATM mutation    The patient does not have children.  She has a sister who was diagnosed with breast cancer at 27 and underwent genetic testing and was found to carry an ATM mutation.  This sister has a daughter who also has an ATM mutation.  The patient has one brother who is deceased from a heart attack.    The patient's mother developed breast cancer at 44.  She has three sisters and a brother.  One sister developed breast cancer at 71.  The patient's maternal grandparents are deceased.  Her grandfather had a heart attack.   His mother also had breast cancer.  The patient's father died in a car accident at 56.  He had two sisters, one who died at birth and the other who died within the last year of non cancer related issues.  The paternal grandparents are deceased.  The grandfather died of multiple cancers, including lung, brain and bone cancer.  The grandmother died from complications of Alzheimer's disease.  Patient's maternal ancestors are of [place] descent, and paternal ancestors are of [place] descent. There is no reported Ashkenazi Jewish ancestry. There is no known consanguinity.  GENETIC TEST RESULTS:  We recommended Ms. Barbary pursue testing for the familial hereditary cancer gene mutation called ATM, [id number] ([id number]). Ms. Shorb's test was normal and did not reveal the familial mutation. We call this result a true negative result because the cancer-causing mutation was identified in Ms. Coufal's family, and she did not inherit it.    ADDITIONAL GENETIC TESTING: We discussed with Ms. Nies that there are other genes that are associated with increased cancer risk that can be analyzed. The laboratories that offer such testing look at these additional genes via a hereditary cancer gene panel. Should Ms. Sessler wish to pursue additional genetic testing, we are happy to discuss and coordinate this testing, at any time.    CANCER SCREENING RECOMMENDATIONS:  This normal result is reassuring and indicates  that Ms. Murchison does not likely have an increased risk of cancer due to a mutation in one of these genes.  We, therefore, recommended  Ms. Segundo continue to follow the cancer screening guidelines provided by her primary healthcare providers.   RECOMMENDATIONS FOR FAMILY MEMBERS: Women in this family might be at some increased risk of developing cancer, over the general population risk, simply due to the family history of cancer. We recommended women in this family have a yearly mammogram  beginning at age 47, or 70 years younger than the earliest onset of cancer, an annual clinical breast exam, and perform monthly breast self-exams. Women in this family should also have a gynecological exam as recommended by their primary provider. All family members should have a colonoscopy by age 18.  Based on [name]'s family history, we recommended her mother, who was diagnosed with breast cancer at age 75, have genetic counseling and testing. Ms. Mozer will let us know if we can be of any assistance in coordinating genetic counseling and/or testing for this family member.   FOLLOW-UP: Lastly, we discussed with Ms. Loge that cancer genetics is a rapidly advancing field and it is possible that new genetic tests will be appropriate for her and/or her family members in the future. We encouraged her to remain in contact with cancer genetics on an annual basis so we can update her personal and family histories and let her know of advances in cancer genetics that may benefit this family.   Our contact number was provided. Ms. Velez's questions were answered to her satisfaction, and she knows she is welcome to call us at anytime with additional questions or concerns.   Roma Kayser, MS, [facility] Certified Genetic Counselor [email or web address]

## 2017-06-16 DIAGNOSIS — M5412 Radiculopathy, cervical region: Secondary | ICD-10-CM | POA: Insufficient documentation

## 2017-06-16 HISTORY — DX: Radiculopathy, cervical region: M54.12

## 2017-07-04 ENCOUNTER — Ambulatory Visit: Payer: Self-pay | Admitting: Orthopedic Surgery

## 2017-07-22 ENCOUNTER — Ambulatory Visit: Payer: Medicaid Other | Attending: Specialist | Admitting: Physical Therapy

## 2017-08-10 ENCOUNTER — Encounter (HOSPITAL_BASED_OUTPATIENT_CLINIC_OR_DEPARTMENT_OTHER): Payer: Self-pay | Admitting: *Deleted

## 2017-08-10 ENCOUNTER — Other Ambulatory Visit: Payer: Self-pay

## 2017-08-10 NOTE — H&P (View-Only) (Signed)
NPO AFTER MN W/ EXCEPTION CLEAR LIQUIDS UNTIL 0715 (NO CREAM/MILK PRODUCTS).  ARRIVE AT 1115.  NEEDS ISTAT 8.  CURRENT EKG IN CHART W/ CLEARANCE.  WILL TAKE TYLENOL AM DOS W/ SIPS OF WATER AND IF NEEDED MAY TAKE KLONOPIN/ ROBAXIN.

## 2017-08-10 NOTE — Progress Notes (Signed)
NPO AFTER MN W/ EXCEPTION CLEAR LIQUIDS UNTIL 0715 (NO CREAM/MILK PRODUCTS).  ARRIVE AT 1115.  NEEDS ISTAT 8.  CURRENT EKG IN CHART W/ CLEARANCE.  WILL TAKE TYLENOL AM DOS W/ SIPS OF WATER AND IF NEEDED MAY TAKE KLONOPIN/ ROBAXIN.

## 2017-08-13 ENCOUNTER — Ambulatory Visit (HOSPITAL_BASED_OUTPATIENT_CLINIC_OR_DEPARTMENT_OTHER): Payer: Medicaid Other | Admitting: Anesthesiology

## 2017-08-13 ENCOUNTER — Encounter (HOSPITAL_BASED_OUTPATIENT_CLINIC_OR_DEPARTMENT_OTHER)
Admission: RE | Disposition: A | Discharge status: Home / Self Care | Payer: Self-pay | Source: Ambulatory Visit | Attending: Specialist

## 2017-08-13 ENCOUNTER — Encounter (HOSPITAL_BASED_OUTPATIENT_CLINIC_OR_DEPARTMENT_OTHER): Payer: Self-pay

## 2017-08-13 ENCOUNTER — Ambulatory Visit (HOSPITAL_BASED_OUTPATIENT_CLINIC_OR_DEPARTMENT_OTHER)
Admission: RE | Admit: 2017-08-13 | Discharge: 2017-08-13 | Disposition: A | Discharge status: Home / Self Care | Payer: Medicaid Other | Source: Ambulatory Visit | Attending: Specialist | Admitting: Specialist

## 2017-08-13 DIAGNOSIS — Z888 Allergy status to other drugs, medicaments and biological substances status: Secondary | ICD-10-CM | POA: Insufficient documentation

## 2017-08-13 DIAGNOSIS — Z8249 Family history of ischemic heart disease and other diseases of the circulatory system: Secondary | ICD-10-CM | POA: Diagnosis not present

## 2017-08-13 DIAGNOSIS — M66811 Spontaneous rupture of other tendons, right shoulder: Secondary | ICD-10-CM | POA: Insufficient documentation

## 2017-08-13 DIAGNOSIS — F419 Anxiety disorder, unspecified: Secondary | ICD-10-CM | POA: Insufficient documentation

## 2017-08-13 DIAGNOSIS — Z96643 Presence of artificial hip joint, bilateral: Secondary | ICD-10-CM | POA: Diagnosis not present

## 2017-08-13 DIAGNOSIS — K582 Mixed irritable bowel syndrome: Secondary | ICD-10-CM | POA: Insufficient documentation

## 2017-08-13 DIAGNOSIS — K219 Gastro-esophageal reflux disease without esophagitis: Secondary | ICD-10-CM | POA: Diagnosis not present

## 2017-08-13 DIAGNOSIS — F329 Major depressive disorder, single episode, unspecified: Secondary | ICD-10-CM | POA: Insufficient documentation

## 2017-08-13 DIAGNOSIS — M5137 Other intervertebral disc degeneration, lumbosacral region: Secondary | ICD-10-CM | POA: Diagnosis not present

## 2017-08-13 DIAGNOSIS — M797 Fibromyalgia: Secondary | ICD-10-CM | POA: Diagnosis not present

## 2017-08-13 DIAGNOSIS — Z8719 Personal history of other diseases of the digestive system: Secondary | ICD-10-CM | POA: Diagnosis not present

## 2017-08-13 DIAGNOSIS — I10 Essential (primary) hypertension: Secondary | ICD-10-CM | POA: Insufficient documentation

## 2017-08-13 DIAGNOSIS — Z9989 Dependence on other enabling machines and devices: Secondary | ICD-10-CM | POA: Diagnosis not present

## 2017-08-13 DIAGNOSIS — M5030 Other cervical disc degeneration, unspecified cervical region: Secondary | ICD-10-CM | POA: Diagnosis not present

## 2017-08-13 DIAGNOSIS — M75101 Unspecified rotator cuff tear or rupture of right shoulder, not specified as traumatic: Secondary | ICD-10-CM | POA: Diagnosis not present

## 2017-08-13 DIAGNOSIS — G4733 Obstructive sleep apnea (adult) (pediatric): Secondary | ICD-10-CM | POA: Insufficient documentation

## 2017-08-13 DIAGNOSIS — Z885 Allergy status to narcotic agent status: Secondary | ICD-10-CM | POA: Insufficient documentation

## 2017-08-13 DIAGNOSIS — M19011 Primary osteoarthritis, right shoulder: Secondary | ICD-10-CM | POA: Insufficient documentation

## 2017-08-13 DIAGNOSIS — R7303 Prediabetes: Secondary | ICD-10-CM | POA: Insufficient documentation

## 2017-08-13 DIAGNOSIS — E039 Hypothyroidism, unspecified: Secondary | ICD-10-CM | POA: Diagnosis not present

## 2017-08-13 DIAGNOSIS — E782 Mixed hyperlipidemia: Secondary | ICD-10-CM | POA: Diagnosis not present

## 2017-08-13 DIAGNOSIS — D510 Vitamin B12 deficiency anemia due to intrinsic factor deficiency: Secondary | ICD-10-CM | POA: Diagnosis not present

## 2017-08-13 DIAGNOSIS — Z7982 Long term (current) use of aspirin: Secondary | ICD-10-CM | POA: Insufficient documentation

## 2017-08-13 DIAGNOSIS — Z8673 Personal history of transient ischemic attack (TIA), and cerebral infarction without residual deficits: Secondary | ICD-10-CM | POA: Diagnosis not present

## 2017-08-13 DIAGNOSIS — M94211 Chondromalacia, right shoulder: Secondary | ICD-10-CM | POA: Diagnosis not present

## 2017-08-13 DIAGNOSIS — Z87891 Personal history of nicotine dependence: Secondary | ICD-10-CM | POA: Insufficient documentation

## 2017-08-13 DIAGNOSIS — Z9889 Other specified postprocedural states: Secondary | ICD-10-CM

## 2017-08-13 DIAGNOSIS — Z79891 Long term (current) use of opiate analgesic: Secondary | ICD-10-CM | POA: Insufficient documentation

## 2017-08-13 DIAGNOSIS — M7521 Bicipital tendinitis, right shoulder: Secondary | ICD-10-CM | POA: Insufficient documentation

## 2017-08-13 DIAGNOSIS — Z79899 Other long term (current) drug therapy: Secondary | ICD-10-CM | POA: Insufficient documentation

## 2017-08-13 DIAGNOSIS — Z881 Allergy status to other antibiotic agents status: Secondary | ICD-10-CM | POA: Insufficient documentation

## 2017-08-13 HISTORY — DX: Allergic rhinitis, unspecified: J30.9

## 2017-08-13 HISTORY — DX: Spondylosis without myelopathy or radiculopathy, lumbosacral region: M47.817

## 2017-08-13 HISTORY — DX: Personal history of transient ischemic attack (TIA), and cerebral infarction without residual deficits: Z86.73

## 2017-08-13 HISTORY — DX: Irritable bowel syndrome without diarrhea: K58.9

## 2017-08-13 HISTORY — DX: Other spondylosis with radiculopathy, cervical region: M47.22

## 2017-08-13 HISTORY — DX: Strain of muscle, fascia and tendon of other parts of biceps, unspecified arm, initial encounter: S46.219A

## 2017-08-13 HISTORY — DX: Obstructive sleep apnea (adult) (pediatric): G47.33

## 2017-08-13 HISTORY — DX: Unspecified rotator cuff tear or rupture of right shoulder, not specified as traumatic: M75.101

## 2017-08-13 HISTORY — DX: Prediabetes: R73.03

## 2017-08-13 HISTORY — DX: Personal history of other diseases of the digestive system: Z87.19

## 2017-08-13 HISTORY — DX: Unspecified osteoarthritis, unspecified site: M19.90

## 2017-08-13 HISTORY — DX: Dermatitis, unspecified: L30.9

## 2017-08-13 HISTORY — DX: Other chronic pain: G89.29

## 2017-08-13 HISTORY — DX: Other intervertebral disc degeneration, lumbosacral region: M51.37

## 2017-08-13 HISTORY — DX: Diverticulosis of large intestine without perforation or abscess without bleeding: K57.30

## 2017-08-13 HISTORY — DX: Other intervertebral disc degeneration, lumbosacral region without mention of lumbar back pain or lower extremity pain: M51.379

## 2017-08-13 HISTORY — DX: Mixed hyperlipidemia: E78.2

## 2017-08-13 HISTORY — PX: SHOULDER ARTHROSCOPY WITH ROTATOR CUFF REPAIR: SHX5685

## 2017-08-13 LAB — POCT I-STAT, CHEM 8
BUN: 24 mg/dL — ABNORMAL HIGH (ref 6–20)
CALCIUM ION: 1.22 mmol/L (ref 1.15–1.40)
CHLORIDE: 102 mmol/L (ref 101–111)
Creatinine, Ser: 0.8 mg/dL (ref 0.44–1.00)
GLUCOSE: 98 mg/dL (ref 65–99)
HCT: 37 % (ref 36.0–46.0)
Hemoglobin: 12.6 g/dL (ref 12.0–15.0)
Potassium: 4.5 mmol/L (ref 3.5–5.1)
Sodium: 140 mmol/L (ref 135–145)
TCO2: 27 mmol/L (ref 22–32)

## 2017-08-13 SURGERY — ARTHROSCOPY, SHOULDER, WITH ROTATOR CUFF REPAIR
Anesthesia: General | Site: Shoulder | Laterality: Right

## 2017-08-13 MED ORDER — ROPIVACAINE HCL 7.5 MG/ML IJ SOLN
INTRAMUSCULAR | Status: DC | PRN
  Administered 2017-08-13: 20 mL via PERINEURAL

## 2017-08-13 MED ORDER — MIDAZOLAM HCL 2 MG/2ML IJ SOLN
INTRAMUSCULAR | Status: AC
  Filled 2017-08-13: qty 2

## 2017-08-13 MED ORDER — LIDOCAINE 2% (20 MG/ML) 5 ML SYRINGE
INTRAMUSCULAR | Status: AC
  Filled 2017-08-13: qty 5

## 2017-08-13 MED ORDER — HYDROMORPHONE HCL 1 MG/ML IJ SOLN
INTRAMUSCULAR | Status: AC
  Filled 2017-08-13: qty 2

## 2017-08-13 MED ORDER — HYDROMORPHONE HCL 1 MG/ML IJ SOLN
0.2500 mg | INTRAMUSCULAR | Status: DC | PRN
  Administered 2017-08-13: 0.5 mg via INTRAVENOUS
  Filled 2017-08-13: qty 0.5

## 2017-08-13 MED ORDER — HYDROMORPHONE HCL 1 MG/ML IJ SOLN
INTRAMUSCULAR | Status: AC
  Filled 2017-08-13: qty 1

## 2017-08-13 MED ORDER — ONDANSETRON HCL 4 MG/2ML IJ SOLN
INTRAMUSCULAR | Status: DC | PRN
  Administered 2017-08-13: 4 mg via INTRAVENOUS

## 2017-08-13 MED ORDER — SUCCINYLCHOLINE CHLORIDE 20 MG/ML IJ SOLN
INTRAMUSCULAR | Status: DC | PRN
  Administered 2017-08-13: 100 mg via INTRAVENOUS

## 2017-08-13 MED ORDER — HYDROMORPHONE HCL 2 MG PO TABS: 2.0000 mg | ORAL_TABLET | ORAL | 0 refills | Status: AC | PRN

## 2017-08-13 MED ORDER — DEXAMETHASONE SODIUM PHOSPHATE 10 MG/ML IJ SOLN
INTRAMUSCULAR | Status: AC
  Filled 2017-08-13: qty 1

## 2017-08-13 MED ORDER — SODIUM CHLORIDE 0.9 % IJ SOLN
INTRAMUSCULAR | Status: DC | PRN
  Administered 2017-08-13: 10 mL via INTRA_ARTICULAR

## 2017-08-13 MED ORDER — HYDROMORPHONE HCL 2 MG PO TABS
2.0000 mg | ORAL_TABLET | ORAL | Status: DC | PRN
  Administered 2017-08-13: 2 mg via ORAL
  Filled 2017-08-13: qty 1

## 2017-08-13 MED ORDER — HYDROMORPHONE HCL 2 MG PO TABS
ORAL_TABLET | ORAL | Status: AC
  Filled 2017-08-13: qty 1

## 2017-08-13 MED ORDER — PROPOFOL 10 MG/ML IV BOLUS
INTRAVENOUS | Status: AC
  Filled 2017-08-13: qty 20

## 2017-08-13 MED ORDER — DEXAMETHASONE SODIUM PHOSPHATE 4 MG/ML IJ SOLN
INTRAMUSCULAR | Status: DC | PRN
  Administered 2017-08-13: 10 mg via INTRAVENOUS

## 2017-08-13 MED ORDER — PROMETHAZINE HCL 25 MG/ML IJ SOLN
6.2500 mg | INTRAMUSCULAR | Status: DC | PRN
  Filled 2017-08-13: qty 1

## 2017-08-13 MED ORDER — BACLOFEN 10 MG PO TABS: 10.0000 mg | ORAL_TABLET | Freq: Three times a day (TID) | ORAL | 1 refills | Status: AC

## 2017-08-13 MED ORDER — LIDOCAINE HCL (CARDIAC) 20 MG/ML IV SOLN
INTRAVENOUS | Status: DC | PRN
  Administered 2017-08-13: 30 mg via INTRAVENOUS

## 2017-08-13 MED ORDER — BUPIVACAINE HCL (PF) 0.25 % IJ SOLN
INTRAMUSCULAR | Status: DC | PRN
  Administered 2017-08-13: 30 mL

## 2017-08-13 MED ORDER — LACTATED RINGERS IV SOLN
INTRAVENOUS | Status: DC
  Administered 2017-08-13: 12:00:00 via INTRAVENOUS
  Filled 2017-08-13: qty 1000

## 2017-08-13 MED ORDER — CEFAZOLIN SODIUM-DEXTROSE 2-4 GM/100ML-% IV SOLN
INTRAVENOUS | Status: AC
  Filled 2017-08-13: qty 100

## 2017-08-13 MED ORDER — MIDAZOLAM HCL 2 MG/2ML IJ SOLN
1.0000 mg | Freq: Once | INTRAMUSCULAR | Status: AC
  Administered 2017-08-13: 1 mg via INTRAVENOUS
  Filled 2017-08-13: qty 1

## 2017-08-13 MED ORDER — ONDANSETRON HCL 4 MG/2ML IJ SOLN
INTRAMUSCULAR | Status: AC
  Filled 2017-08-13: qty 2

## 2017-08-13 MED ORDER — PROPOFOL 10 MG/ML IV BOLUS
INTRAVENOUS | Status: DC | PRN
  Administered 2017-08-13: 150 mg via INTRAVENOUS
  Administered 2017-08-13: 50 mg via INTRAVENOUS

## 2017-08-13 MED ORDER — LIDOCAINE HCL 4 % EX SOLN
CUTANEOUS | Status: DC | PRN
  Administered 2017-08-13: 3 mL via TOPICAL

## 2017-08-13 MED ORDER — CEFAZOLIN SODIUM-DEXTROSE 2-4 GM/100ML-% IV SOLN
2.0000 g | INTRAVENOUS | Status: AC
  Administered 2017-08-13: 2 g via INTRAVENOUS
  Filled 2017-08-13: qty 100

## 2017-08-13 MED ORDER — CEPHALEXIN 500 MG PO CAPS: 500.0000 mg | ORAL_CAPSULE | Freq: Three times a day (TID) | ORAL | 0 refills | Status: DC

## 2017-08-13 MED ORDER — CHLORHEXIDINE GLUCONATE 4 % EX LIQD
60.0000 mL | Freq: Once | CUTANEOUS | Status: DC
  Filled 2017-08-13: qty 118

## 2017-08-13 MED ORDER — SODIUM CHLORIDE 0.9 % IR SOLN
Status: DC | PRN
  Administered 2017-08-13: 6000 mL

## 2017-08-13 MED ORDER — SUCCINYLCHOLINE CHLORIDE 200 MG/10ML IV SOSY
PREFILLED_SYRINGE | INTRAVENOUS | Status: AC
  Filled 2017-08-13: qty 10

## 2017-08-13 MED FILL — HYDROmorphone HCL 2 MG TABS: 2 | 7 days supply | Qty: 40 | Fill #0

## 2017-08-13 MED FILL — CEPHALEXIN 500 MG CAPSULE: 500 | 4 days supply | Qty: 12 | Fill #0

## 2017-08-13 MED FILL — BACLOFEN 10 MG TABS: 10 | 30 days supply | Qty: 90 | Fill #0

## 2017-08-13 SURGICAL SUPPLY — 81 items
ANCH SUT SWLK 19.1 CLS EYLT TL (Anchor) ×1 IMPLANT
ANCH SUT SWLK 19.1 CLS EYLT VT (Anchor) ×2 IMPLANT
ANCH SUT SWLK 19.1X4.75 (Anchor) ×1 IMPLANT
ANCHOR BIO SWLOCK 4.75 W/TIG (Anchor) ×4 IMPLANT
ANCHOR SUT BIO SW 4.75 W/FIB (Anchor) ×2 IMPLANT
ANCHOR SUT BIO SW 4.75X19.1 (Anchor) ×2 IMPLANT
BLADE CUDA GRT WHITE 3.5 (BLADE) ×3 IMPLANT
BLADE CUTTER GATOR 3.5 (BLADE) IMPLANT
BLADE GREAT WHITE 4.2 (BLADE) ×2 IMPLANT
BLADE GREAT WHITE 4.2MM (BLADE) ×1
BLADE SURG 11 STRL SS (BLADE) ×1 IMPLANT
BLADE SURG 15 STRL LF DISP TIS (BLADE) ×1 IMPLANT
BLADE SURG 15 STRL SS (BLADE) ×3
BUR 3.5 LG SPHERICAL (BURR) IMPLANT
BUR OVAL 6.0 (BURR) ×3 IMPLANT
BURR 3.5 LG SPHERICAL (BURR)
BURR 3.5MM LG SPHERICAL (BURR)
CANNULA 5.75X7 CRYSTAL CLEAR (CANNULA) ×3 IMPLANT
CANNULA 5.75X71 LONG (CANNULA) IMPLANT
CANNULA TWIST IN 8.25X7CM (CANNULA) ×4 IMPLANT
DRAPE LG THREE QUARTER DISP (DRAPES) ×3 IMPLANT
DRAPE ORTHO SPLIT 77X108 STRL (DRAPES) ×6
DRAPE POUCH INSTRU U-SHP 10X18 (DRAPES) ×3 IMPLANT
DRAPE STERI 35X30 U-POUCH (DRAPES) ×3 IMPLANT
DRAPE SURG 17X23 STRL (DRAPES) ×3 IMPLANT
DRAPE SURG ORHT 6 SPLT 77X108 (DRAPES) ×2 IMPLANT
DRAPE U-SHAPE 47X51 STRL (DRAPES) ×3 IMPLANT
DURAPREP 26ML APPLICATOR (WOUND CARE) ×3 IMPLANT
ELECT MENISCUS 165MM 90D (ELECTRODE) IMPLANT
ELECT REM PT RETURN 9FT ADLT (ELECTROSURGICAL) ×3
ELECTRODE REM PT RTRN 9FT ADLT (ELECTROSURGICAL) ×1 IMPLANT
FIBERSTICK 2 (SUTURE) IMPLANT
GAUZE SPONGE 4X4 12PLY STRL (GAUZE/BANDAGES/DRESSINGS) ×3 IMPLANT
GAUZE SPONGE 4X4 12PLY STRL LF (GAUZE/BANDAGES/DRESSINGS) ×2 IMPLANT
GAUZE XEROFORM 1X8 LF (GAUZE/BANDAGES/DRESSINGS) ×3 IMPLANT
GLOVE BIO SURGEON STRL SZ7.5 (GLOVE) ×3 IMPLANT
GLOVE BIO SURGEON STRL SZ8 (GLOVE) ×3 IMPLANT
GLOVE INDICATOR 8.0 STRL GRN (GLOVE) ×6 IMPLANT
GOWN STRL REUS W/TWL LRG LVL3 (GOWN DISPOSABLE) IMPLANT
GOWN STRL REUS W/TWL XL LVL3 (GOWN DISPOSABLE) ×6 IMPLANT
KIT RM TURNOVER CYSTO AR (KITS) ×3 IMPLANT
LASSO SUT 90 DEGREE (SUTURE) IMPLANT
MANIFOLD NEPTUNE II (INSTRUMENTS) ×2 IMPLANT
NDL 1/2 CIR CATGUT .05X1.09 (NEEDLE) IMPLANT
NDL SCORPION MULTI FIRE (NEEDLE) IMPLANT
NEEDLE 1/2 CIR CATGUT .05X1.09 (NEEDLE) IMPLANT
NEEDLE HYPO 22GX1.5 SAFETY (NEEDLE) ×3 IMPLANT
NEEDLE SCORPION MULTI FIRE (NEEDLE) ×3 IMPLANT
NS IRRIG 500ML POUR BTL (IV SOLUTION) IMPLANT
PACK ARTHROSCOPY DSU (CUSTOM PROCEDURE TRAY) ×3 IMPLANT
PACK BASIN DAY SURGERY FS (CUSTOM PROCEDURE TRAY) ×3 IMPLANT
PAD ABD 8X10 STRL (GAUZE/BANDAGES/DRESSINGS) ×3 IMPLANT
PAD ARMBOARD 7.5X6 YLW CONV (MISCELLANEOUS) ×4 IMPLANT
PENCIL BUTTON HOLSTER BLD 10FT (ELECTRODE) IMPLANT
PROBE BIPOLAR ATHRO 135MM 90D (MISCELLANEOUS) ×3 IMPLANT
SET ARTHROSCOPY TUBING (MISCELLANEOUS) ×3
SET ARTHROSCOPY TUBING PVC (MISCELLANEOUS) ×1 IMPLANT
SLEEVE ARM SUSPENSION SYSTEM (MISCELLANEOUS) ×3 IMPLANT
SLING S3 LATERAL DISP (MISCELLANEOUS) ×2 IMPLANT
SLING ULTRA II L (ORTHOPEDIC SUPPLIES) ×3 IMPLANT
SLING ULTRA II LARGE (SOFTGOODS) ×2 IMPLANT
SLING ULTRA II S (ORTHOPEDIC SUPPLIES) ×1 IMPLANT
SPONGE LAP 4X18 X RAY DECT (DISPOSABLE) IMPLANT
SUT 2 FIBERLOOP 20 STRT BLUE (SUTURE)
SUT ETHILON 3 0 PS 1 (SUTURE) ×3 IMPLANT
SUT FIBERWIRE #2 38 T-5 BLUE (SUTURE)
SUT LASSO 45 DEGREE LEFT (SUTURE) IMPLANT
SUT LASSO 45D RIGHT (SUTURE) IMPLANT
SUT PDS AB 0 CT1 36 (SUTURE) IMPLANT
SUT TIGER TAPE 7 IN WHITE (SUTURE) ×2 IMPLANT
SUT VIC AB 0 CT1 36 (SUTURE) IMPLANT
SUT VIC AB 2-0 CT1 27 (SUTURE)
SUT VIC AB 2-0 CT1 TAPERPNT 27 (SUTURE) IMPLANT
SUTURE 2 FIBERLOOP 20 STRT BLU (SUTURE) IMPLANT
SUTURE FIBERWR #2 38 T-5 BLUE (SUTURE) IMPLANT
SYR CONTROL 10ML LL (SYRINGE) ×3 IMPLANT
TAPE CLOTH SURG 6X10 WHT LF (GAUZE/BANDAGES/DRESSINGS) ×2 IMPLANT
TOWEL OR 17X24 6PK STRL BLUE (TOWEL DISPOSABLE) ×6 IMPLANT
TUBE CONNECTING 12'X1/4 (SUCTIONS) ×2
TUBE CONNECTING 12X1/4 (SUCTIONS) ×3 IMPLANT
WATER STERILE IRR 500ML POUR (IV SOLUTION) ×3 IMPLANT

## 2017-08-13 NOTE — Discharge Instructions (Signed)

## 2017-08-13 NOTE — Anesthesia Procedure Notes (Signed)
Procedure Name: Intubation Date/Time: 08/13/2017 1:46 PM Performed by: Justice Rocher, CRNA Pre-anesthesia Checklist: Patient identified, Emergency Drugs available, Suction available and Patient being monitored Patient Re-evaluated:Patient Re-evaluated prior to induction Oxygen Delivery Method: Circle system utilized Preoxygenation: Pre-oxygenation with 100% oxygen Induction Type: IV induction Ventilation: Mask ventilation without difficulty Laryngoscope Size: Mac and 3 Grade View: Grade II Tube type: Oral Tube size: 7.0 mm Number of attempts: 1 Airway Equipment and Method: Stylet,  Oral airway and LTA kit utilized Placement Confirmation: ETT inserted through vocal cords under direct vision,  positive ETCO2 and breath sounds checked- equal and bilateral Secured at: 22 cm Tube secured with: Tape Dental Injury: Teeth and Oropharynx as per pre-operative assessment

## 2017-08-13 NOTE — Op Note (Signed)
[phone]

## 2017-08-13 NOTE — Anesthesia Procedure Notes (Addendum)
Anesthesia Regional Block: Interscalene brachial plexus block   Pre-Anesthetic Checklist: ,, timeout performed, Correct Patient, Correct Site, Correct Laterality, Correct Procedure, Correct Position, site marked, Risks and benefits discussed,  Surgical consent,  Pre-op evaluation,  At surgeon's request and post-op pain management  Laterality: Right  Prep: chloraprep       Needles:  Injection technique: Single-shot  Needle Type: Echogenic Needle     Needle Length: 9cm  Needle Gauge: 21     Additional Needles:   Procedures:,,,, ultrasound used (permanent image in chart),,,,  Narrative:  Start time: 08/13/2017 12:40 PM End time: 08/13/2017 12:45 PM Injection made incrementally with aspirations every 5 mL.  Performed by: Personally  Anesthesiologist: Catalina Gravel, MD  Additional Notes: No pain on injection. No increased resistance to injection. Injection made in 5cc increments.  Good needle visualization.  Patient tolerated procedure well.

## 2017-08-13 NOTE — Transfer of Care (Signed)
Immediate Anesthesia Transfer of Care Note  Patient: Kristin Pope  Procedure(s) Performed: Procedure(s) (LRB): RIGHT SHOULDER ARTHROSCOPY, DEBRIDEMENT, BICEPS TENOTOMY, ROTATOR CUFF REPAIR, DISTAL CLAVICLE RESECTION (Right)  Patient Location: PACU  Anesthesia Type: General  Level of Consciousness: awake, sedated, patient cooperative and responds to stimulation  Airway & Oxygen Therapy: Patient Spontanous Breathing and Patient connected to [place] oxygen  Post-op Assessment: Report given to PACU RN, Post -op Vital signs reviewed and stable and Patient moving all extremities  Post vital signs: Reviewed and stable  Complications: No apparent anesthesia complications

## 2017-08-13 NOTE — Interval H&P Note (Signed)
History and Physical Interval Note:  08/13/2017 1:00 PM  Kristin Pope  has presented today for surgery, with the diagnosis of Right shoulder rotator cuff tear, acromioclavicular osteoarthritis, biceps tear  The various methods of treatment have been discussed with the patient and family. After consideration of risks, benefits and other options for treatment, the patient has consented to  Procedure(s): RIGHT SHOULDER ARTHROSCOPY, DEBRIDEMENT, BICEPS TENOTOMY, ROTATOR CUFF REPAIR, DISTAL CLAVICLE RESECTION, POSSIBLE SUPERIOR CAPSULAR RECONSTRUCTION (Right) as a surgical intervention .  The patient's history has been reviewed, patient examined, no change in status, stable for surgery.  I have reviewed the patient's chart and labs.  Questions were answered to the patient's satisfaction.     [name]

## 2017-08-13 NOTE — H&P (Signed)
Kristin Pope is an 61 y.o. female.   Chief Complaint: Right shoulder pain HPI: Patient presents with joint discomfort that had been persistent for several weeks now. Despite conservative treatments, the patients discomfort has not improved. Imaging was obtained. Other conservative and surgical treatments were discussed in detail. Patient wishes to proceed with surgery as consented. Denies SOB, CP, or calf pain. No Fever, chills, or nausea/ vomiting.   Past Medical History:  Diagnosis Date  . Allergic rhinitis   . Anemia, pernicious    b12 def.  . Anxiety   . Arthritis   . Biceps tendon tear    right  . Cervical spondylosis with radiculopathy    C2 -- C7  . Chronic fatigue   . Chronic pain    neck, back  . DDD (degenerative disc disease), cervical   . DDD (degenerative disc disease), lumbosacral   . Depression, major, recurrent ([place])   . Diverticulosis of colon   . Eczema   . Family history of adverse reaction to anesthesia    sister has problems waking up  . Family history of breast cancer   . Fibromyalgia 03/29/2007  . GERD (gastroesophageal reflux disease)   . Headache    constant headaches  . History of colonic diverticulitis 08/16/2010   w/ perforation (lower GI bleed)--- resolved without surgerical intervention  . 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 Hx of pyelonephritis 09/2005   due to UTI  . Hypertension   . IBS (irritable bowel syndrome)    internates between constpation/ diarrhea  . Lumbosacral spondylosis    L2-3, L4-5  . Mild obstructive sleep apnea    per study 06/ 2009 mild osa  AHI 13/hr---  recommendation given mouth appliance, loss wt., cpap  . Mixed hyperlipidemia   . OA (osteoarthritis)    right shoulder AC joint  . Pre-diabetes   . Right rotator cuff tear     Past Surgical History:  Procedure Laterality Date  . BREAST SURGERY     breast biopsy-benign  .  COLONOSCOPY  last one 08-08-2009  . DILATATION & CURRETTAGE/HYSTEROSCOPY WITH RESECTOCOPE  02-03-2011   dr Dellis Filbert  [facility]   polypectomy  . RADIAL OPTIC NEUROTOMY     twice in lumbar area of back-every 6 months  . TOTAL HIP ARTHROPLASTY Left 07/17/2015   Procedure: LEFT TOTAL HIP ARTHROPLASTY ANTERIOR APPROACH;  Surgeon: Paralee Cancel, MD;  Location: [facility];  Service: Orthopedics;  Laterality: Left;  . TOTAL HIP ARTHROPLASTY Right 08/21/2015   Procedure: RIGHT TOTAL HIP ARTHROPLASTY ANTERIOR APPROACH;  Surgeon: Paralee Cancel, MD;  Location: [facility];  Service: Orthopedics;  Laterality: Right;  . TRANSTHORACIC ECHOCARDIOGRAM  12/30/2015   ef 81-77%, grade 1 diastolic dysfunction/  mild MR/ trivial TR    Family History  Problem Relation Age of Onset  . Heart disease Mother   . Heart attack Mother   . Asthma Mother   . Breast cancer Mother 98  . Other Father        car accident  . Breast cancer Sister 82  . Lung cancer Sister   . Other Sister        ATM mutation  . Heart attack Brother 85  . Coronary artery disease Brother   . Breast cancer Maternal Grandmother   . Rheum arthritis Maternal Grandmother   . Stroke Maternal Grandfather   . Brain cancer Paternal Grandfather   . Lung cancer  Paternal Grandfather   . Breast cancer Maternal Aunt 34  . Alzheimer's disease Paternal Grandmother   . Testicular cancer Cousin 28  . Other Other        ATM mutation   Social History:  reports that she quit smoking about 32 years ago. Her smoking use included cigarettes. She quit after 11.00 years of use. she has never used smokeless tobacco. She reports that she drinks alcohol. She reports that she does not use drugs.  Allergies:  Allergies  Allergen Reactions  . Morphine And Related Other (See Comments)    Headaches - pt can take hydromorphone  . Codeine Other (See Comments)    REACTION: Insomnia "crazy dreams"  . Fentanyl Nausea And Vomiting    Fentanyl patch - nausea and vomiting.  . Gluten  Meal Other (See Comments)    Pt avoids eating gluten  . Hydrocodone Other (See Comments)    Urinary retention---  "can only take 1/2 tablet low dose"  . Lactose Intolerance (Gi) Other (See Comments)    Upset stomach  . Lipitor [Atorvastatin] Other (See Comments)    Pain, myalgias  . Monosodium Glutamate Other (See Comments)    MSG  --- Increases blood pressure  . Tetracycline Nausea And Vomiting  . Tramadol Other (See Comments)    "Passed out"    Medications Prior to Admission  Medication Sig Dispense Refill  . acetaminophen (TYLENOL) 650 MG CR tablet Take 1,300 mg every 8 (eight) hours as needed by mouth for pain.    Marland Kitchen aspirin 325 MG tablet Take 1 tablet (325 mg total) by mouth daily. 30 tablet 10  . Cariprazine HCl (VRAYLAR) 3 MG CAPS Take 1 capsule at bedtime by mouth. VRAYLAR    . celecoxib (CELEBREX) 100 MG capsule Take 100 mg by mouth daily as needed (leg pain).    . celecoxib (CELEBREX) 200 MG capsule Take 200 mg 2 (two) times daily as needed by mouth.    . cholecalciferol (VITAMIN D) 1000 UNITS tablet Take 1,000 Units daily by mouth.     . clindamycin (CLEOCIN) 2 % vaginal cream Place 1 Applicatorful at bedtime vaginally. For 7 days --    . clonazePAM (KLONOPIN) 1 MG tablet Take 1 tablet (1 mg total) by mouth 4 (four) times daily. (Patient taking differently: Take 1 mg by mouth 4 (four) times daily as needed for anxiety. ) 60 tablet 0  . cyanocobalamin (,VITAMIN B-12,) 1000 MCG/ML injection Inject 1,000 mcg See admin instructions into the muscle. Vitamin B12 - every month --  last injection approx 07-24-2017    . doxepin (SINEQUAN) 25 MG capsule Take 25-50 mg at bedtime by mouth.    . DULoxetine (CYMBALTA) 60 MG capsule Take 60 mg by mouth at bedtime.   5  . ezetimibe (ZETIA) 10 MG tablet Take 1 tablet (10 mg total) by mouth daily. (Patient taking differently: Take 10 mg every evening by mouth. ) 30 tablet 10  . fluticasone (FLONASE) 50 MCG/ACT nasal spray Place 1 spray as needed  into both nostrils.   5  . furosemide (LASIX) 20 MG tablet Take 20 mg by mouth every Monday, Wednesday, and Friday.    . gabapentin (NEURONTIN) 300 MG capsule Take 300 mg at bedtime by mouth. PT ONLY TAKES AT BEDTIME--  STATED PRESCRIBED TID    . hydrocortisone cream 1 % Apply 1 application topically at bedtime as needed for itching.     . lubiprostone (AMITIZA) 24 MCG capsule Take 1 capsule (24 mcg  total) by mouth daily with breakfast. (Patient taking differently: Take 24 mcg by mouth at bedtime. ) 30 capsule 11  . methocarbamol (ROBAXIN) 500 MG tablet Take 500 mg every 8 (eight) hours as needed by mouth for muscle spasms.    . montelukast (SINGULAIR) 10 MG tablet Take 10 mg by mouth at bedtime.     . nadolol (CORGARD) 40 MG tablet TAKE 1 TABLET EVERY DAY (Patient taking differently: TAKE 1 TABLET EVERY DAY AT BEDTIME) 90 tablet 0  . nitrofurantoin, macrocrystal-monohydrate, (MACROBID) 100 MG capsule Take 100 mg at bedtime by mouth. Continuous course  5  . NUVIGIL 150 MG tablet TAKE 1 TABLET BY MOUTH EVERY DAY (Patient taking differently: TAKE 1/2 TO 1 TABLET BY MOUTH 2 TIMES DAILY AS NEEDED FOR ENERGY BOOST) 30 tablet 1  . omeprazole (PRILOSEC) 40 MG capsule Take 40 mg at bedtime by mouth.     . TESTOSTERONE IM Inject 1 application See admin instructions into the muscle. Every 2 1/2 months at Dr. Stann Mainland at [place] office - last injection approx.  End of October 2018    . tiZANidine (ZANAFLEX) 4 MG tablet Take 4 mg 4 (four) times daily as needed by mouth (pain  (per pt takes daily in evening and one during the day if needed)).   3  . zolpidem (AMBIEN) 10 MG tablet Take 10 mg by mouth at bedtime.     . Chlorphen-Pseudoephed-APAP (ALLERGY SINUS-D MAX ST PO) Take 2 (two) times daily as needed by mouth (SINUS HEADACHE).    . Cyanocobalamin (VITAMIN B-12) 5000 MCG TBDP Take 5,000 mcg daily with lunch by mouth. Per pt varies 2069mg, 25084m, or 500024m   . GuaiFENesin (MUCINEX PO) Take 1 tablet  by mouth 2 (two) times daily as needed (congestion from seasonal allergies).    . Multiple Vitamin (MULTIVITAMIN IRON-FREE) TABS Take daily by mouth.    . pravastatin (PRAVACHOL) 40 MG tablet Take 40 mg every evening by mouth. Per pt has not taken in few months (on 08-10-2017)    . rosuvastatin (CRESTOR) 5 MG tablet Take 0.5 tablets (2.5 mg total) by mouth daily at 6 PM. (Patient not taking: Reported on 08/10/2017) 30 tablet 2    Results for orders placed or performed during the hospital encounter of 08/13/17 (from the past 48 hour(s))  I-STAT, chem 8     Status: Abnormal   Collection Time: 08/13/17 12:29 PM  Result Value Ref Range   Sodium 140 135 - 145 mmol/L   Potassium 4.5 3.5 - 5.1 mmol/L   Chloride 102 101 - 111 mmol/L   BUN 24 (H) 6 - 20 mg/dL   Creatinine, Ser 0.80 0.44 - 1.00 mg/dL   Glucose, Bld 98 65 - 99 mg/dL   Calcium, Ion 1.22 1.15 - 1.40 mmol/L   TCO2 27 22 - 32 mmol/L   Hemoglobin 12.6 12.0 - 15.0 g/dL   HCT 37.0 36.0 - 46.0 %   No results found.  Review of Systems  Constitutional: Negative.   HENT: Negative.   Eyes: Negative.   Respiratory: Negative.   Cardiovascular: Negative.   Gastrointestinal: Negative.   Genitourinary: Negative.   Musculoskeletal: Positive for joint pain.  Skin: Negative.   Neurological: Negative.   Endo/Heme/Allergies: Negative.   Psychiatric/Behavioral: Negative.     Blood pressure 132/69, pulse (!) 59, temperature 98.4 F (36.9 C), temperature source Oral, resp. rate 16, height 4' 10"  (1.473 m), weight 71.4 kg (157 lb 8 oz), SpO2 100 %.  Physical Exam  Constitutional: She is oriented to person, place, and time. She appears well-developed.  HENT:  Head: Normocephalic.  Eyes: EOM are normal.  Neck: Normal range of motion.  Cardiovascular: Normal rate and intact distal pulses.  Respiratory: Effort normal.  GI: Soft.  Genitourinary:  Genitourinary Comments: Deferred  Musculoskeletal:  Right UE grossly n/v inatct. 2+ radial  pulse. Limited ROM and strength of right shoulder.  Neurological: She is alert and oriented to person, place, and time.  Skin: Skin is warm and dry.  Psychiatric: Her behavior is normal.     Assessment/Plan Right Shoulder RCT: Right shoulder scope as consented D/c home Follow instructions Take medications as directed  Shantana Christon L, PA-C 08/13/2017, 12:51 PM

## 2017-08-13 NOTE — Progress Notes (Signed)
1247 time out called wit;h Dr. Gifford Shave for ISB.  1248 versed 1 mg iv given.  1252 block completed toerated well.

## 2017-08-13 NOTE — Anesthesia Postprocedure Evaluation (Signed)
Anesthesia Post Note  Patient: Kristin Pope  Procedure(s) Performed: RIGHT SHOULDER ARTHROSCOPY, DEBRIDEMENT, BICEPS TENOTOMY, ROTATOR CUFF REPAIR, DISTAL CLAVICLE RESECTION (Right Shoulder)     Patient location during evaluation: PACU Anesthesia Type: General Level of consciousness: awake and alert Pain management: pain level controlled Vital Signs Assessment: post-procedure vital signs reviewed and stable Respiratory status: spontaneous breathing, nonlabored ventilation, respiratory function stable and patient connected to nasal cannula oxygen Cardiovascular status: blood pressure returned to baseline and stable Postop Assessment: no apparent nausea or vomiting Anesthetic complications: no    Last Vitals:  Vitals:   08/13/17 1600 08/13/17 1615  BP: 133/68 137/83  Pulse: 66 74  Resp: 16 17  Temp:    SpO2: 92% 92%    Last Pain:  Vitals:   08/13/17 1700  TempSrc:   PainSc: [place]

## 2017-08-13 NOTE — Anesthesia Preprocedure Evaluation (Signed)
Anesthesia Evaluation  Patient identified by MRN, date of birth, ID band Patient awake    Reviewed: Allergy & Precautions, NPO status , Patient's Chart, lab work & pertinent test results  Airway Mallampati: II  TM Distance: >3 FB Neck ROM: Full    Dental  (+) Teeth Intact, Dental Advisory Given   Pulmonary sleep apnea , former smoker,    Pulmonary exam normal breath sounds clear to auscultation       Cardiovascular hypertension, Pt. on home beta blockers Normal cardiovascular exam Rhythm:Regular Rate:Normal     Neuro/Psych  Headaches, PSYCHIATRIC DISORDERS Anxiety Depression C2-C7 radiculopathy TIA Neuromuscular disease    GI/Hepatic Neg liver ROS, GERD  Medicated,  Endo/Other  Hypothyroidism Obesity   Renal/GU negative Renal ROS     Musculoskeletal  (+) Arthritis , Fibromyalgia -, narcotic dependent  Abdominal   Peds  Hematology negative hematology ROS (+)   Anesthesia Other Findings Day of surgery medications reviewed with the patient.  Reproductive/Obstetrics                             Anesthesia Physical Anesthesia Plan  ASA: II  Anesthesia Plan: General   Post-op Pain Management:  Regional for Post-op pain   Induction: Intravenous  PONV Risk Score and Plan: 3 and Dexamethasone, Ondansetron and Midazolam  Airway Management Planned: Oral ETT  Additional Equipment:   Intra-op Plan:   Post-operative Plan: Extubation in OR  Informed Consent: I have reviewed the patients History and Physical, chart, labs and discussed the procedure including the risks, benefits and alternatives for the proposed anesthesia with the patient or authorized representative who has indicated his/her understanding and acceptance.   Dental advisory given  Plan Discussed with: CRNA  Anesthesia Plan Comments: (Risks/benefits of general anesthesia discussed with patient including risk of damage to  teeth, lips, gum, and tongue, nausea/vomiting, allergic reactions to medications, and the possibility of heart attack, stroke and death.  All patient questions answered.  Patient wishes to proceed.)        Anesthesia Quick Evaluation

## 2017-08-14 ENCOUNTER — Encounter (HOSPITAL_BASED_OUTPATIENT_CLINIC_OR_DEPARTMENT_OTHER): Payer: Self-pay | Admitting: Specialist

## 2017-08-14 NOTE — Op Note (Signed)
NAMEOPHA, MCGHEE NO.:  [id number]  MEDICAL RECORD NO.:  [id number]  LOCATION:                                 FACILITY:  PHYSICIAN:  Cynda Familia, M.D. DATE OF BIRTH:  DATE OF PROCEDURE:  08/13/2017 DATE OF DISCHARGE:                              OPERATIVE REPORT   PREOPERATIVE DIAGNOSES: 1. Right shoulder rotator cuff tear. 2. Acromioclavicular arthritis. 3. Biceps tendinopathy.  POSTOPERATIVE DIAGNOSES: 1. Right shoulder large rotator cuff tear. 2. Acromioclavicular arthritis. 3. Pre-existing rupture of long head of biceps. 4. Os acromiale.  PROCEDURES: 1. Right shoulder glenohumeral arthroscopy with arthroscopic rotator     cuff repair. 2. Arthroscopic subacromial decompression, acromioplasty, bursectomy,     coracoacromial ligament release. 3. Arthroscopic distal clavicle resection or Mumford procedure.  SURGEON:  Cynda Familia, M.D.  ASSISTANT:  Wyatt Portela, PA-C.  ANESTHESIA:  Interscalene block and general.  ESTIMATED BLOOD LOSS:  Minimal.  DRAINS:  None.  COMPLICATIONS:  None.  DISPOSITION:  PACU, stable.  OPERATIVE DETAILS:  The patient and family counseled in the holding area.  Correct site was identified, marked, and signed appropriately. IV started, sedation given, block was administered.  IV antibiotics were given.  Taken to the operating room, placed in supine position under general anesthesia.  PAS stockings were applied for DVT prophylaxis. Gently placed into a left lateral decubitus position, properly padded and bumped.  Right shoulder was gently examined, full range of motion and stable.  Prepped with DuraPrep and draped in sterile fashion.  Had a time-out done.  The posterior portal was created.  We utilized the Arthrex sterile shoulder holder at 15 degrees, 12 pounds of longitudinal traction, at 30 degrees of abduction.  Scope revealed a large rotator cuff tear, some cuff tendinopathy, long head  of the biceps has already previously ruptured and retracted.  There was mild glenohumeral chondromalacia.  No significant arthrosis.  Subacromial region was debrided with subacromial bursectomy.  Lateral portal was established.  Neurovascular structures were protected including the axillary nerve.  Subacromial subdeltoid bursectomy was performed.  Trudee Kuster was then placed posteriorly.  She was found to have a very small anterior os acromiale, it was removed with a burr, leaving the deltoid attached and lateral acromioplasty was performed.  Then, we made an anterior accessory portal, the distal clavicle was resected approximately 5 to 8 mm, and the superior capsule intact.  Clavicle was palpated, found to be stable.  Debris removed.  Hemostasis obtained. Soft tissues were taken off the greater tuberosity with the cautery and then a light burring.  The rotator cuff appeared to be in satisfactory mobility back to the greater tuberosity.  Anteriorly, small puncture was made, an Arthrex BioComposite anchor was placed.  Four limbs of suture placed, 2 FiberWire, 2 FiberTape.  Another anchor was then placed posteriorly at the infraspinatus, 3 limbs of suture, 2 FiberTape, 1 FiberWire.  Arm was then abducted.  The posterior sutures in place into an anchor through a SwiveLock and then anterior anchor was placed with double-row suture bridge technique.  There were no complicating features.  Had an excellent repair of the rotator cuff down to bleeding bone without excessive  tension.  There were no other abnormalities noted.  Taken out of the traction.  Portals were closed with 4-0 nylon suture.  10 mL Sensorcaine placed on the skin.  Sterile dressing applied.  Placed in shoulder abduction sling, turned supine, awakened, taken from the operating room to the PACU in stable condition.  She will be stabilized in the PACU and discharged home.  To help with the patient positioning, prepping, draping,  technical and surgical assistance throughout the entire case, wound closure, application of dressing and sling, Mr. Wyatt Portela, PA-C's assistance was needed.    ______________________________ Cynda Familia, M.D.   ______________________________ Cynda Familia, M.D.    RAC/MEDQ  D:  08/13/2017  T:  08/14/2017  Job:  [id number]

## 2017-08-19 ENCOUNTER — Encounter: Payer: Self-pay | Admitting: Physical Therapy

## 2017-08-19 ENCOUNTER — Ambulatory Visit: Payer: Medicaid Other | Attending: Specialist | Admitting: Physical Therapy

## 2017-08-19 DIAGNOSIS — R293 Abnormal posture: Secondary | ICD-10-CM | POA: Insufficient documentation

## 2017-08-19 DIAGNOSIS — M6281 Muscle weakness (generalized): Secondary | ICD-10-CM | POA: Diagnosis present

## 2017-08-19 DIAGNOSIS — M25611 Stiffness of right shoulder, not elsewhere classified: Secondary | ICD-10-CM

## 2017-08-19 DIAGNOSIS — M25511 Pain in right shoulder: Secondary | ICD-10-CM | POA: Diagnosis present

## 2017-08-19 NOTE — Therapy (Signed)
[facility] [address], [place], [place], [address] Phone: [phone]   Fax:  [phone]  Physical Therapy Evaluation  Patient Details  Name: Kristin Pope MRN: [id number] Date of Birth: 17-Sep-1956 Referring Provider: Hart Robinsons, MD   Encounter Date: 08/19/2017  PT End of Session - 08/19/17 1145    Visit Number  1    Date for PT Re-Evaluation  09/30/17    Authorization Type  Medicaid    Authorization Time Period  Pending medicaid approval    PT Start Time  1101    PT Stop Time  6294 Session limited and ended early due to pt feeling sick    PT Time Calculation (min)  34 min    Activity Tolerance  Patient limited by pain;Other (comment) limited due to pt nausea    Behavior During Therapy  Flat affect       Past Medical History:  Diagnosis Date  . Allergic rhinitis   . Anemia, pernicious    b12 def.  . Anxiety   . Arthritis   . Biceps tendon tear    right  . Cervical spondylosis with radiculopathy    C2 -- C7  . Chronic fatigue   . Chronic pain    neck, back  . DDD (degenerative disc disease), cervical   . DDD (degenerative disc disease), lumbosacral   . Depression, major, recurrent ([place])   . Diverticulosis of colon   . Eczema   . Family history of adverse reaction to anesthesia    sister has problems waking up  . Family history of breast cancer   . Fibromyalgia 03/29/2007  . GERD (gastroesophageal reflux disease)   . Headache    constant headaches  . History of colonic diverticulitis 08/16/2010   w/ perforation (lower GI bleed)--- resolved without surgerical intervention  . 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 Hx of pyelonephritis 09/2005   due to UTI  . Hypertension   . IBS (irritable bowel syndrome)    internates between constpation/ diarrhea  . Lumbosacral spondylosis    L2-3, L4-5  . Mild obstructive  sleep apnea    per study 06/ 2009 mild osa  AHI 13/hr---  recommendation given mouth appliance, loss wt., cpap  . Mixed hyperlipidemia   . OA (osteoarthritis)    right shoulder AC joint  . Pre-diabetes   . Right rotator cuff tear     Past Surgical History:  Procedure Laterality Date  . BREAST SURGERY     breast biopsy-benign  . COLONOSCOPY  last one 08-08-2009  . DILATATION & CURRETTAGE/HYSTEROSCOPY WITH RESECTOCOPE  02-03-2011   dr Dellis Filbert  [facility]   polypectomy  . RADIAL OPTIC NEUROTOMY     twice in lumbar area of back-every 6 months  . SHOULDER ARTHROSCOPY WITH ROTATOR CUFF REPAIR Right 08/13/2017   Procedure: RIGHT SHOULDER ARTHROSCOPY, DEBRIDEMENT, BICEPS TENOTOMY, ROTATOR CUFF REPAIR, DISTAL CLAVICLE RESECTION;  Surgeon: Sydnee Cabal, MD;  Location: [place];  Service: Orthopedics;  Laterality: Right;  . TOTAL HIP ARTHROPLASTY Left 07/17/2015   Procedure: LEFT TOTAL HIP ARTHROPLASTY ANTERIOR APPROACH;  Surgeon: Paralee Cancel, MD;  Location[facility];  Service: Orthopedics;  Laterality: Left;  . TOTAL HIP ARTHROPLASTY Right 08/21/2015   Procedure: RIGHT TOTAL HIP ARTHROPLASTY ANTERIOR APPROACH;  Surgeon: Paralee Cancel, MD;  [facility];  Service: Orthopedics;  Laterality: Right;  . TRANSTHORACIC ECHOCARDIOGRAM  12/30/2015  ef 29-51%, grade 1 diastolic dysfunction/  mild MR/ trivial TR    There were no vitals filed for this visit.   Subjective Assessment - 08/19/17 1106    Subjective  Pt reports that she underwent Rt rotator cuff repair on 08/13/17. Since then, she has been feeling on/off dizzy and nauseous. Her pain is not too bad, but she is trying to keep up with taking her medications. She saw Dr. Theda Sers this morning who reports things look good.     Limitations  Other (comment) using RUE    Patient Stated Goals  improve shoulder ROM, strength     Currently in Pain?  No/denies Pt reports feeling more nauseous than pain          [facility] PT Assessment -  08/19/17 0001      Assessment   Medical Diagnosis  Rt shoulder scope/RTC repair/biceps tenotomy    Referring Provider  Hart Robinsons, MD    Onset Date/Surgical Date  08/13/17    Hand Dominance  Right    Next MD Visit  09/18/17 approx    Prior Therapy  prior to surgery      Precautions   Precautions  Shoulder    Type of Shoulder Precautions  Rotator cuff repair       Balance Screen   Has the patient fallen in the past 6 months  No    Has the patient had a decrease in activity level because of a fear of falling?   No    Is the patient reluctant to leave their home because of a fear of falling?   No      Cognition   Overall Cognitive Status  No family/caregiver present to determine baseline cognitive functioning    Behaviors  Other (comment) pt lethargic and feeling sick throughout session       Observation/Other Assessments   Other Surveys   Other Surveys    Upper Extremity Functional Index   0/80      Posture/Postural Control   Posture Comments  Pt wearing wedged sling on RUE; Rt lateral cervical tilt/Rt shoulder shrug      ROM / Strength   AROM / PROM / Strength  PROM;Strength      PROM   Overall PROM Comments  AROM not tested due to surgical precautions     PROM Assessment Site  Shoulder    Right/Left Shoulder  Right;Left    Right Shoulder Flexion  40 Degrees    Right Shoulder ABduction  40 Degrees    Right Shoulder Internal Rotation  -- elbow by side, able to reach pt's stomach     Right Shoulder External Rotation  20 Degrees elbow by side, 20 deg from stomach       Strength   Overall Strength Comments  Rt grip strength 5lb      Transfers   Comments  Pt needing MinA with transitions in/out of supine from sitting              Objective measurements completed on examination: See above findings.      [place] Adult PT Treatment/Exercise - 08/19/17 0001      Exercises   Exercises  Shoulder      Shoulder Exercises: Supine   Other Supine Exercises  scap  retraction x10 reps  incline       Modalities   Modalities  Cryotherapy      Cryotherapy   Number Minutes Cryotherapy  3 Minutes discontinued due to pt request/  not feeling well     Cryotherapy Location  Shoulder Rt    Type of Cryotherapy  Ice pack             PT Education - 08/19/17 1142    Education provided  Yes    Education Details  eval findings/POC; importance of addressing scapular mobility; ice parameters     Person(s) Educated  Patient    Methods  Explanation;Handout    Comprehension  Verbalized understanding;Need further instruction       PT Short Term Goals - 08/19/17 1155      PT SHORT TERM GOAL #1   Title  Pt will demo consistency and independence with her HEP to decrease inflammation and improve shoulder ROM.    Time  2    Period  Weeks    Status  New    Target Date  09/02/17      PT SHORT TERM GOAL #2   Title  Pt will demo improved posture awareness evident by her ability to maintain upright posture without cervical tilt and without cuing from the therapist during her session.     Time  2    Period  Weeks    Status  New      PT SHORT TERM GOAL #3   Title  Pt will be able to complete bed mobility activity such as transitioning sit <> supine with no more than SBA.     Time  2    Period  Weeks    Status  New        PT Long Term Goals - 08/19/17 1158      PT LONG TERM GOAL #1   Title  Pt will be independent in advanced HEP to allow for continuous progression towards her goals.     Time  6    Period  Weeks    Status  New    Target Date  09/16/17      PT LONG TERM GOAL #2   Title  Pt will demonstrate atleast a 9 point improvement in her UEFI to reflect and improvement in her UE functional use.     Time  6    Period  Weeks    Status  New      PT LONG TERM GOAL #3   Title  Pt will demo atleast 120 deg of passive Rt shoulder flexion to allow her to progress towards functional tasks when protocol allows.    Time  6    Period  Weeks    Status   New      PT LONG TERM GOAL #4   Title  Pt will report atleast 25% decrease in her pain and stiffness from the start of PT to allow for improved quality of life and daily activity tolerance.     Time  6    Period  Weeks    Status  New      PT LONG TERM GOAL #5   Title  Pt will demo improved Rt shoulder passive external rotation to atleast 60 deg to allow pt to reach above head once strength allows.     Time  6    Period  Weeks    Status  New      Additional Long Term Goals   Additional Long Term Goals  Yes      PT LONG TERM GOAL #6   Title  Pt will demo atleast 5lb improvement in Rt grip strength to increase in  her independence with opening jars at home.     Time  6    Period  Weeks    Status  New             Plan - 08/19/17 1147    Clinical Impression Statement  Pt presents today s/p Rt shoulder scope with RTC repair on 08/13/17. She arrived reporting feeling nauseous, so session was limited and this was monitored throughout. She demonstrates post-surgical pain in addition to significant limitations in Rt shoulder passive ROM, as well as decreased Rt grip strength, poor scapular control/mobility and muscle spasm throughout the Rt cervical/upper trap region following surgery. She has no more than 40 deg of passive shoulder flexion and is unable to attain neutral shoulder external rotation at this time. She would greatly benefit from skilled PT to address her limitations in ROM, strength, decrease pain, improve scapular control and increase her independence with UE function. Therapist provided education to pt this session regarding use of modalities for inflammation/pain and implemented her HEP. She does require reinforcement of the topics covered this session.     History and Personal Factors relevant to plan of care:  Rt and Lt THA, DDD, Fibromyalgia    Clinical Presentation  Evolving    Clinical Presentation due to:  significant limitations in ROM, strength, mobility, etc. post  operative expectations     Clinical Decision Making  Moderate    Rehab Potential  Fair    Clinical Impairments Affecting Rehab Potential  (-) limited visits due to insurance     PT Frequency  3x / week    PT Duration  6 weeks    PT Treatment/Interventions  ADLs/Self Care Home Management;Cryotherapy;Electrical Stimulation;Moist Heat;Iontophoresis 4mg /ml Dexamethasone;Therapeutic activities;Therapeutic exercise;Patient/family education;Orthotic Fit/Training;Neuromuscular re-education;Manual techniques;Taping;Dry needling;Passive range of motion;Scar mobilization    PT Next Visit Plan  scapular mobilizations; shoulder PROM; grip strength/squeezes for home; modalities and manual for pain modulation     PT Home Exercise Plan  update frequently as able due to limited visits    Consulted and Agree with Plan of Care  Patient       Patient will benefit from skilled therapeutic intervention in order to improve the following deficits and impairments:  Decreased activity tolerance, Impaired flexibility, Impaired UE functional use, Hypomobility, Decreased strength, Decreased range of motion, Decreased endurance, Decreased coordination, Decreased scar mobility, Increased muscle spasms, Postural dysfunction, Pain, Improper body mechanics  Visit Diagnosis: Stiffness of right shoulder, not elsewhere classified  Acute pain of right shoulder  Muscle weakness (generalized)  Abnormal posture     Problem List Patient Active Problem List   Diagnosis Date Noted  . S/P arthroscopy of right shoulder 08/13/2017  . Genetic testing 05/29/2017  . Family history of breast cancer   . Hyperlipidemia LDL goal <70 12/30/2015  . Cerebrovascular disease or lesion 12/30/2015  . Mild pulmonary hypertension ([place]) 12/30/2015  . TIA (transient ischemic attack) 12/28/2015  . GERD (gastroesophageal reflux disease) 07/23/2015  . Chronic pain 07/23/2015  . Obese 07/19/2015  . S/P left THA, AA 07/17/2015  . 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 Insomnia 03/03/2011  . OSA (obstructive sleep apnea) 03/03/2011  . Depression, major, recurrent ([place]) 12/02/2010  . CONSTIPATION, SLOW TRANSIT 10/04/2010  . IRRITABLE BOWEL SYNDROME 08/14/2009  . CYSTITIS, CHRONIC INTERSTITIAL 06/27/2009  . UNSPECIFIED HYPOTHYROIDISM 05/16/2009  . LIPOMA OF OTHER SPECIFIED SITES 04/13/2009  . BRUXISM 04/13/2009  . SYNCOPE 03/13/2009  . Abnormal finding on MRI of brain 02/07/2009  . MUSCLE WEAKNESS (  GENERALIZED) 12/20/2008  . UNSPECIFIED ALLERGIC ALVEOLITIS AND PNEUMONITIS 08/02/2008  . HIP PAIN, LEFT, CHRONIC 07/04/2008  . MALAISE AND FATIGUE 07/04/2008  . LOW BACK PAIN 05/26/2008  . PROTEINURIA 12/23/2007  . HYPERCALCEMIA 11/18/2007  . Essential hypertension 09/14/2007  . ANEMIA, B12 DEFICIENCY 05/18/2007  . 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 ALLERGIC RHINITIS 03/29/2007  . FIBROMYALGIA 03/29/2007   12:12 PM,08/19/17 Elly Modena PT, DPT [facility] [address] [place], [place], [address] Phone: [phone]   Fax:  [phone]  Name: JENNAFER GLADUE MRN: [id number] Date of Birth: [birth year]

## 2017-08-19 NOTE — Patient Instructions (Signed)
  SCAPULAR RETRACTIONS  Draw your shoulder blades back and down.      SHOULDER ROLLS  Move your shoulders in a circular pattern as shown so that your are moving in an up, back and down direction. Perform small cicles if needed for comfort.       Cradle Pendulums  Support your affected arm by holding your bent elbow with the other hand.  Gently lean forward at the waist and gently rock the affected arm side to side as if you were rocking a baby. Keeping your arm supported by the opposite side the whole time.  You can also perform small circles or rock forward and backward.    [place] [address], [place], [place] [address] Phone # [phone] Fax [phone]

## 2017-08-28 ENCOUNTER — Ambulatory Visit: Payer: Medicaid Other | Admitting: Physical Therapy

## 2017-08-28 DIAGNOSIS — M25511 Pain in right shoulder: Secondary | ICD-10-CM

## 2017-08-28 DIAGNOSIS — R293 Abnormal posture: Secondary | ICD-10-CM

## 2017-08-28 DIAGNOSIS — M6281 Muscle weakness (generalized): Secondary | ICD-10-CM

## 2017-08-28 DIAGNOSIS — M25611 Stiffness of right shoulder, not elsewhere classified: Secondary | ICD-10-CM | POA: Diagnosis not present

## 2017-08-28 NOTE — Therapy (Signed)
[facility] [address] [place], [place], [address] Phone: [phone]   Fax:  [phone]  Physical Therapy Treatment  Patient Details  Name: Kristin Pope MRN: [id number] Date of Birth: Feb 16, 1956 Referring Provider: Hart Robinsons, MD   Encounter Date: 08/28/2017  PT End of Session - 08/28/17 1146    Visit Number  2    Date for PT Re-Evaluation  09/30/17    Authorization Type  Medicaid    Authorization Time Period  Pending medicaid approval    PT Start Time  1110    PT Stop Time  1155    PT Time Calculation (min)  45 min    Activity Tolerance  Patient tolerated treatment well    Behavior During Therapy  Flat affect       Past Medical History:  Diagnosis Date  . Allergic rhinitis   . Anemia, pernicious    b12 def.  . Anxiety   . Arthritis   . Biceps tendon tear    right  . Cervical spondylosis with radiculopathy    C2 -- C7  . Chronic fatigue   . Chronic pain    neck, back  . DDD (degenerative disc disease), cervical   . DDD (degenerative disc disease), lumbosacral   . Depression, major, recurrent ([place])   . Diverticulosis of colon   . Eczema   . Family history of adverse reaction to anesthesia    sister has problems waking up  . Family history of breast cancer   . Fibromyalgia 03/29/2007  . GERD (gastroesophageal reflux disease)   . Headache    constant headaches  . History of colonic diverticulitis 08/16/2010   w/ perforation (lower GI bleed)--- resolved without surgerical intervention  . 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 Hx of pyelonephritis 09/2005   due to UTI  . Hypertension   . IBS (irritable bowel syndrome)    internates between constpation/ diarrhea  . Lumbosacral spondylosis    L2-3, L4-5  . Mild obstructive sleep apnea    per study 06/ 2009 mild osa  AHI 13/hr---  recommendation given mouth  appliance, loss wt., cpap  . Mixed hyperlipidemia   . OA (osteoarthritis)    right shoulder AC joint  . Pre-diabetes   . Right rotator cuff tear     Past Surgical History:  Procedure Laterality Date  . BREAST SURGERY     breast biopsy-benign  . COLONOSCOPY  last one 08-08-2009  . DILATATION & CURRETTAGE/HYSTEROSCOPY WITH RESECTOCOPE  02-03-2011   dr Dellis Filbert  [facility]   polypectomy  . RADIAL OPTIC NEUROTOMY     twice in lumbar area of back-every 6 months  . SHOULDER ARTHROSCOPY WITH ROTATOR CUFF REPAIR Right 08/13/2017   Procedure: RIGHT SHOULDER ARTHROSCOPY, DEBRIDEMENT, BICEPS TENOTOMY, ROTATOR CUFF REPAIR, DISTAL CLAVICLE RESECTION;  Surgeon: Sydnee Cabal, MD;  Location: [place];  Service: Orthopedics;  Laterality: Right;  . TOTAL HIP ARTHROPLASTY Left 07/17/2015   Procedure: LEFT TOTAL HIP ARTHROPLASTY ANTERIOR APPROACH;  Surgeon: Paralee Cancel, MD;  [facility];  Service: Orthopedics;  Laterality: Left;  . TOTAL HIP ARTHROPLASTY Right 08/21/2015   Procedure: RIGHT TOTAL HIP ARTHROPLASTY ANTERIOR APPROACH;  Surgeon: Paralee Cancel, MD;  [facility];  Service: Orthopedics;  Laterality: Right;  . TRANSTHORACIC ECHOCARDIOGRAM  12/30/2015   ef 57-32%, grade 1 diastolic dysfunction/  mild MR/ trivial TR    There  were no vitals filed for this visit.  Subjective Assessment - 08/28/17 1114    Subjective  Pain is better than last week. Taking meds and ice to help.    Pain Score  3     Pain Location  Shoulder    Pain Orientation  Right    Pain Descriptors / Indicators  Sore    Multiple Pain Sites  No         [facility] PT Assessment - 08/28/17 0001      PROM   Right Shoulder Flexion  90 Degrees    Right Shoulder ABduction  80 Degrees                  [facility] Adult PT Treatment/Exercise - 08/28/17 0001      Shoulder Exercises: Supine   Other Supine Exercises  Scap squeezes 5x VC to not elevate the scapula    Other Supine Exercises  Elbow active  felx/ext 2x10, forearm pronation/sup 10x VC to relax the shoulder      Moist Heat Therapy   Number Minutes Moist Heat  10 Minutes    Moist Heat Location  Shoulder      Electrical Stimulation   Electrical Stimulation Location  RT shoulder    Electrical Stimulation Action  premod: A/P shoulder    Electrical Stimulation Parameters  80-150 HZ    Electrical Stimulation Goals  Pain For post session soreness      Manual Therapy   Manual Therapy  Passive ROM    Manual therapy comments  Feathering to RTUE post PROM     Passive ROM  All planes to tolerance               PT Short Term Goals - 08/19/17 1155      PT SHORT TERM GOAL #1   Title  Pt will demo consistency and independence with her HEP to decrease inflammation and improve shoulder ROM.    Time  2    Period  Weeks    Status  New    Target Date  09/02/17      PT SHORT TERM GOAL #2   Title  Pt will demo improved posture awareness evident by her ability to maintain upright posture without cervical tilt and without cuing from the therapist during her session.     Time  2    Period  Weeks    Status  New      PT SHORT TERM GOAL #3   Title  Pt will be able to complete bed mobility activity such as transitioning sit <> supine with no more than SBA.     Time  2    Period  Weeks    Status  New        PT Long Term Goals - 08/19/17 1158      PT LONG TERM GOAL #1   Title  Pt will be independent in advanced HEP to allow for continuous progression towards her goals.     Time  6    Period  Weeks    Status  New    Target Date  09/16/17      PT LONG TERM GOAL #2   Title  Pt will demonstrate atleast a 9 point improvement in her UEFI to reflect and improvement in her UE functional use.     Time  6    Period  Weeks    Status  New      PT LONG TERM GOAL #  3   Title  Pt will demo atleast 120 deg of passive Rt shoulder flexion to allow her to progress towards functional tasks when protocol allows.    Time  6    Period  Weeks     Status  New      PT LONG TERM GOAL #4   Title  Pt will report atleast 25% decrease in her pain and stiffness from the start of PT to allow for improved quality of life and daily activity tolerance.     Time  6    Period  Weeks    Status  New      PT LONG TERM GOAL #5   Title  Pt will demo improved Rt shoulder passive external rotation to atleast 60 deg to allow pt to reach above head once strength allows.     Time  6    Period  Weeks    Status  New      Additional Long Term Goals   Additional Long Term Goals  Yes      PT LONG TERM GOAL #6   Title  Pt will demo atleast 5lb improvement in Rt grip strength to increase in her independence with opening jars at home.     Time  6    Period  Weeks    Status  New            Plan - 08/28/17 1146    Clinical Impression Statement  Pt presented with just a little pain and an overall feeling "much better" since her last session. Pt did appear groggy/sleepy. PROM was improved since eval, no pain and no tightness felt until beyond 90 degrees flexion and 80 degrees abduction. She does continue to struggle with scapular adduction as she defaults to elevation. We discussed this and even suggested to practice a few in front of the mirror for more visual feedback.  No increased pain with PROM, but used Estim post to decrease any post session soreness.     Rehab Potential  Fair    Clinical Impairments Affecting Rehab Potential  (-) limited visits due to insurance     PT Frequency  3x / week    PT Duration  6 weeks    PT Treatment/Interventions  ADLs/Self Care Home Management;Cryotherapy;Electrical Stimulation;Moist Heat;Iontophoresis 4mg /ml Dexamethasone;Therapeutic activities;Therapeutic exercise;Patient/family education;Orthotic Fit/Training;Neuromuscular re-education;Manual techniques;Taping;Dry needling;Passive range of motion;Scar mobilization    PT Next Visit Plan  scapular mobilizations; shoulder PROM; grip strength/squeezes for home;  modalities and manual for pain modulation     Consulted and Agree with Plan of Care  Patient       Patient will benefit from skilled therapeutic intervention in order to improve the following deficits and impairments:  Decreased activity tolerance, Impaired flexibility, Impaired UE functional use, Hypomobility, Decreased strength, Decreased range of motion, Decreased endurance, Decreased coordination, Decreased scar mobility, Increased muscle spasms, Postural dysfunction, Pain, Improper body mechanics  Visit Diagnosis: Stiffness of right shoulder, not elsewhere classified  Acute pain of right shoulder  Muscle weakness (generalized)  Abnormal posture     Problem List Patient Active Problem List   Diagnosis Date Noted  . S/P arthroscopy of right shoulder 08/13/2017  . Genetic testing 05/29/2017  . Family history of breast cancer   . Hyperlipidemia LDL goal <70 12/30/2015  . Cerebrovascular disease or lesion 12/30/2015  . Mild pulmonary hypertension ([place]) 12/30/2015  . TIA (transient ischemic attack) 12/28/2015  . GERD (gastroesophageal reflux disease) 07/23/2015  . Chronic pain 07/23/2015  .  Obese 07/19/2015  . S/P left THA, AA 07/17/2015  . 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 Insomnia 03/03/2011  . OSA (obstructive sleep apnea) 03/03/2011  . Depression, major, recurrent ([place]) 12/02/2010  . CONSTIPATION, SLOW TRANSIT 10/04/2010  . IRRITABLE BOWEL SYNDROME 08/14/2009  . CYSTITIS, CHRONIC INTERSTITIAL 06/27/2009  . UNSPECIFIED HYPOTHYROIDISM 05/16/2009  . LIPOMA OF OTHER SPECIFIED SITES 04/13/2009  . BRUXISM 04/13/2009  . SYNCOPE 03/13/2009  . Abnormal finding on MRI of brain 02/07/2009  . MUSCLE WEAKNESS (GENERALIZED) 12/20/2008  . UNSPECIFIED ALLERGIC ALVEOLITIS AND PNEUMONITIS 08/02/2008  . HIP PAIN, LEFT, CHRONIC 07/04/2008  . MALAISE AND FATIGUE 07/04/2008  . LOW BACK PAIN 05/26/2008  . PROTEINURIA 12/23/2007  . HYPERCALCEMIA 11/18/2007  . Essential  hypertension 09/14/2007  . ANEMIA, B12 DEFICIENCY 05/18/2007  . 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 ALLERGIC RHINITIS 03/29/2007  . FIBROMYALGIA 03/29/2007    Griffyn Kucinski, PTA 08/28/2017, 11:51 AM  [facility] [address] [place], [place], [address] Phone: [phone]   Fax:  [phone]  Name: NOEMIE DEVIVO MRN: [id number] Date of Birth: 09/23/1956

## 2017-08-31 ENCOUNTER — Encounter: Payer: Medicaid Other | Admitting: Physical Therapy

## 2017-09-02 ENCOUNTER — Encounter: Payer: Self-pay | Admitting: Physical Therapy

## 2017-09-02 ENCOUNTER — Ambulatory Visit: Payer: Medicaid Other | Attending: Specialist | Admitting: Physical Therapy

## 2017-09-02 DIAGNOSIS — M6281 Muscle weakness (generalized): Secondary | ICD-10-CM | POA: Diagnosis present

## 2017-09-02 DIAGNOSIS — R293 Abnormal posture: Secondary | ICD-10-CM | POA: Diagnosis present

## 2017-09-02 DIAGNOSIS — M25611 Stiffness of right shoulder, not elsewhere classified: Secondary | ICD-10-CM | POA: Insufficient documentation

## 2017-09-02 DIAGNOSIS — M25511 Pain in right shoulder: Secondary | ICD-10-CM | POA: Diagnosis present

## 2017-09-02 NOTE — Therapy (Signed)
[facility] [address] [place], [place], [address] Phone: [phone]   Fax:  [phone]  Physical Therapy Treatment  Patient Details  Name: Kristin Pope MRN: [id number] Date of Birth: April 10, 1956 Referring Provider: Hart Robinsons, MD   Encounter Date: 09/02/2017  PT End of Session - 09/02/17 1405    Visit Number  3    Date for PT Re-Evaluation  09/30/17    Authorization Type  Medicaid    Authorization Time Period  Pending medicaid approval    PT Start Time  5427    PT Stop Time  1450    PT Time Calculation (min)  47 min    Activity Tolerance  Patient tolerated treatment well    Behavior During Therapy  Flat affect       Past Medical History:  Diagnosis Date  . Allergic rhinitis   . Anemia, pernicious    b12 def.  . Anxiety   . Arthritis   . Biceps tendon tear    right  . Cervical spondylosis with radiculopathy    C2 -- C7  . Chronic fatigue   . Chronic pain    neck, back  . DDD (degenerative disc disease), cervical   . DDD (degenerative disc disease), lumbosacral   . Depression, major, recurrent ([place])   . Diverticulosis of colon   . Eczema   . Family history of adverse reaction to anesthesia    sister has problems waking up  . Family history of breast cancer   . Fibromyalgia 03/29/2007  . GERD (gastroesophageal reflux disease)   . Headache    constant headaches  . History of colonic diverticulitis 08/16/2010   w/ perforation (lower GI bleed)--- resolved without surgerical intervention  . 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 Hx of pyelonephritis 09/2005   due to UTI  . Hypertension   . IBS (irritable bowel syndrome)    internates between constpation/ diarrhea  . Lumbosacral spondylosis    L2-3, L4-5  . Mild obstructive sleep apnea    per study 06/ 2009 mild osa  AHI 13/hr---  recommendation given mouth  appliance, loss wt., cpap  . Mixed hyperlipidemia   . OA (osteoarthritis)    right shoulder AC joint  . Pre-diabetes   . Right rotator cuff tear     Past Surgical History:  Procedure Laterality Date  . BREAST SURGERY     breast biopsy-benign  . COLONOSCOPY  last one 08-08-2009  . DILATATION & CURRETTAGE/HYSTEROSCOPY WITH RESECTOCOPE  02-03-2011   dr Dellis Filbert  [facility]   polypectomy  . RADIAL OPTIC NEUROTOMY     twice in lumbar area of back-every 6 months  . SHOULDER ARTHROSCOPY WITH ROTATOR CUFF REPAIR Right 08/13/2017   Procedure: RIGHT SHOULDER ARTHROSCOPY, DEBRIDEMENT, BICEPS TENOTOMY, ROTATOR CUFF REPAIR, DISTAL CLAVICLE RESECTION;  Surgeon: Sydnee Cabal, MD;  Location: [facility];  Service: Orthopedics;  Laterality: Right;  . TOTAL HIP ARTHROPLASTY Left 07/17/2015   Procedure: LEFT TOTAL HIP ARTHROPLASTY ANTERIOR APPROACH;  Surgeon: Paralee Cancel, MD;  [facility];  Service: Orthopedics;  Laterality: Left;  . TOTAL HIP ARTHROPLASTY Right 08/21/2015   Procedure: RIGHT TOTAL HIP ARTHROPLASTY ANTERIOR APPROACH;  Surgeon: Paralee Cancel, MD;  [facility];  Service: Orthopedics;  Laterality: Right;  . TRANSTHORACIC ECHOCARDIOGRAM  12/30/2015   ef 06-23%, grade 1 diastolic dysfunction/  mild MR/ trivial TR    There  were no vitals filed for this visit.  Subjective Assessment - 09/02/17 1406    Subjective  I feel ok, continues to use ice for pain. Keeps a small bandage on it for "precaution" of a "skin irritation."    Currently in Pain?  Yes    Pain Score  5     Pain Location  Shoulder    Pain Orientation  Right    Pain Descriptors / Indicators  Discomfort    Aggravating Factors   just hurts    Pain Relieving Factors  ice    Multiple Pain Sites  No         [facility] PT Assessment - 09/02/17 0001      PROM   Right Shoulder Flexion  140 Degrees    Right Shoulder ABduction  105 Degrees    Right Shoulder External Rotation  35 Degrees arm by side                   [facility] Adult PT Treatment/Exercise - 09/02/17 0001      Moist Heat Therapy   Number Minutes Moist Heat  15 Minutes    Moist Heat Location  Shoulder      Electrical Stimulation   Electrical Stimulation Location  RT shoulder    Electrical Stimulation Action  pre mod 80-150 HZ    Electrical Stimulation Goals  Pain      Manual Therapy   Manual therapy comments  Feathering to RTUE post PROM  inhibition to forearm extensors & lateral deltoid      Passive ROM  All planes to tolerance               PT Short Term Goals - 09/02/17 1439      PT SHORT TERM GOAL #1   Title  Pt will demo consistency and independence with her HEP to decrease inflammation and improve shoulder ROM.    Time  2    Period  Weeks    Status  On-going Pt is using ice regularly      PT SHORT TERM GOAL #3   Title  Pt will be able to complete bed mobility activity such as transitioning sit <> supine with no more than SBA.     Time  2    Period  Weeks    Status  Achieved        PT Long Term Goals - 08/19/17 1158      PT LONG TERM GOAL #1   Title  Pt will be independent in advanced HEP to allow for continuous progression towards her goals.     Time  6    Period  Weeks    Status  New    Target Date  09/16/17      PT LONG TERM GOAL #2   Title  Pt will demonstrate atleast a 9 point improvement in her UEFI to reflect and improvement in her UE functional use.     Time  6    Period  Weeks    Status  New      PT LONG TERM GOAL #3   Title  Pt will demo atleast 120 deg of passive Rt shoulder flexion to allow her to progress towards functional tasks when protocol allows.    Time  6    Period  Weeks    Status  New      PT LONG TERM GOAL #4   Title  Pt will report atleast 25% decrease in her  pain and stiffness from the start of PT to allow for improved quality of life and daily activity tolerance.     Time  6    Period  Weeks    Status  New      PT LONG TERM GOAL #5   Title  Pt  will demo improved Rt shoulder passive external rotation to atleast 60 deg to allow pt to reach above head once strength allows.     Time  6    Period  Weeks    Status  New      Additional Long Term Goals   Additional Long Term Goals  Yes      PT LONG TERM GOAL #6   Title  Pt will demo atleast 5lb improvement in Rt grip strength to increase in her independence with opening jars at home.     Time  6    Period  Weeks    Status  New            Plan - 09/02/17 1405    Clinical Impression Statement  Pt reports today she has been by herself more therefore is having to use her RTUE more for simple activities. She reports probably having some more discomfort bc of this this. PROM looks fantastic., see measurementrs in chart, all have improved since eval. Does not appear pt is doing any scap squeezes or gripping exs for HEP.     Rehab Potential  Fair    Clinical Impairments Affecting Rehab Potential  (-) limited visits due to insurance     PT Frequency  3x / week    PT Duration  6 weeks    PT Treatment/Interventions  ADLs/Self Care Home Management;Cryotherapy;Electrical Stimulation;Moist Heat;Iontophoresis 4mg /ml Dexamethasone;Therapeutic activities;Therapeutic exercise;Patient/family education;Orthotic Fit/Training;Neuromuscular re-education;Manual techniques;Taping;Dry needling;Passive range of motion;Scar mobilization    PT Next Visit Plan  3rd visit per Medicaid, reassess, Take PROm measurements, update HEP??? PT sees MD 12/26    Consulted and Agree with Plan of Care  Patient       Patient will benefit from skilled therapeutic intervention in order to improve the following deficits and impairments:  Decreased activity tolerance, Impaired flexibility, Impaired UE functional use, Hypomobility, Decreased strength, Decreased range of motion, Decreased endurance, Decreased coordination, Decreased scar mobility, Increased muscle spasms, Postural dysfunction, Pain, Improper body  mechanics  Visit Diagnosis: Stiffness of right shoulder, not elsewhere classified  Acute pain of right shoulder  Muscle weakness (generalized)  Abnormal posture     Problem List Patient Active Problem List   Diagnosis Date Noted  . S/P arthroscopy of right shoulder 08/13/2017  . Genetic testing 05/29/2017  . Family history of breast cancer   . Hyperlipidemia LDL goal <70 12/30/2015  . Cerebrovascular disease or lesion 12/30/2015  . Mild pulmonary hypertension ([place]) 12/30/2015  . TIA (transient ischemic attack) 12/28/2015  . GERD (gastroesophageal reflux disease) 07/23/2015  . Chronic pain 07/23/2015  . Obese 07/19/2015  . S/P left THA, AA 07/17/2015  . 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 Insomnia 03/03/2011  . OSA (obstructive sleep apnea) 03/03/2011  . Depression, major, recurrent ([place]) 12/02/2010  . CONSTIPATION, SLOW TRANSIT 10/04/2010  . IRRITABLE BOWEL SYNDROME 08/14/2009  . CYSTITIS, CHRONIC INTERSTITIAL 06/27/2009  . UNSPECIFIED HYPOTHYROIDISM 05/16/2009  . LIPOMA OF OTHER SPECIFIED SITES 04/13/2009  . BRUXISM 04/13/2009  . SYNCOPE 03/13/2009  . Abnormal finding on MRI of brain 02/07/2009  . MUSCLE WEAKNESS (GENERALIZED) 12/20/2008  . UNSPECIFIED ALLERGIC ALVEOLITIS AND PNEUMONITIS 08/02/2008  . HIP  PAIN, LEFT, CHRONIC 07/04/2008  . MALAISE AND FATIGUE 07/04/2008  . LOW BACK PAIN 05/26/2008  . PROTEINURIA 12/23/2007  . HYPERCALCEMIA 11/18/2007  . Essential hypertension 09/14/2007  . ANEMIA, B12 DEFICIENCY 05/18/2007  . 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 ALLERGIC RHINITIS 03/29/2007  . FIBROMYALGIA 03/29/2007    Mikyla Schachter, PTA 09/02/2017, 2:47 PM   [facility] [address] [place], [place], [address] Phone: [phone]   Fax:  [phone]  Name: SWAYZE KOZUCH MRN: [id number] Date of Birth: March 15, 1956

## 2017-09-09 ENCOUNTER — Ambulatory Visit: Payer: Medicaid Other | Admitting: Physical Therapy

## 2017-09-09 DIAGNOSIS — M25611 Stiffness of right shoulder, not elsewhere classified: Secondary | ICD-10-CM | POA: Diagnosis not present

## 2017-09-09 DIAGNOSIS — R293 Abnormal posture: Secondary | ICD-10-CM

## 2017-09-09 DIAGNOSIS — M25511 Pain in right shoulder: Secondary | ICD-10-CM

## 2017-09-09 DIAGNOSIS — M6281 Muscle weakness (generalized): Secondary | ICD-10-CM

## 2017-09-09 NOTE — Therapy (Signed)
[facility] [address], [place], [place], [address] Phone: [phone]   Fax:  [phone]  Physical Therapy Treatment  Patient Details  Name: Kristin Pope MRN: [id number] Date of Birth: Feb 01, 1956 Referring Provider: Hart Robinsons, MD    Encounter Date: 09/09/2017  PT End of Session - 09/09/17 1101    Visit Number  4    Date for PT Re-Evaluation  09/30/17    Authorization Type  Medicaid    Authorization Time Period  Pending medicaid approval    PT Start Time  1016    PT Stop Time  1100    PT Time Calculation (min)  44 min    Activity Tolerance  Patient tolerated treatment well    Behavior During Therapy  Flat affect       Past Medical History:  Diagnosis Date  . Allergic rhinitis   . Anemia, pernicious    b12 def.  . Anxiety   . Arthritis   . Biceps tendon tear    right  . Cervical spondylosis with radiculopathy    C2 -- C7  . Chronic fatigue   . Chronic pain    neck, back  . DDD (degenerative disc disease), cervical   . DDD (degenerative disc disease), lumbosacral   . Depression, major, recurrent ([place])   . Diverticulosis of colon   . Eczema   . Family history of adverse reaction to anesthesia    sister has problems waking up  . Family history of breast cancer   . Fibromyalgia 03/29/2007  . GERD (gastroesophageal reflux disease)   . Headache    constant headaches  . History of colonic diverticulitis 08/16/2010   w/ perforation (lower GI bleed)--- resolved without surgerical intervention  . 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 Hx of pyelonephritis 09/2005   due to UTI  . Hypertension   . IBS (irritable bowel syndrome)    internates between constpation/ diarrhea  . Lumbosacral spondylosis    L2-3, L4-5  . Mild obstructive sleep apnea    per study 06/ 2009 mild osa  AHI 13/hr---  recommendation given mouth  appliance, loss wt., cpap  . Mixed hyperlipidemia   . OA (osteoarthritis)    right shoulder AC joint  . Pre-diabetes   . Right rotator cuff tear     Past Surgical History:  Procedure Laterality Date  . BREAST SURGERY     breast biopsy-benign  . COLONOSCOPY  last one 08-08-2009  . DILATATION & CURRETTAGE/HYSTEROSCOPY WITH RESECTOCOPE  02-03-2011   dr Dellis Filbert  [facility]   polypectomy  . RADIAL OPTIC NEUROTOMY     twice in lumbar area of back-every 6 months  . SHOULDER ARTHROSCOPY WITH ROTATOR CUFF REPAIR Right 08/13/2017   Procedure: RIGHT SHOULDER ARTHROSCOPY, DEBRIDEMENT, BICEPS TENOTOMY, ROTATOR CUFF REPAIR, DISTAL CLAVICLE RESECTION;  Surgeon: Sydnee Cabal, MD;  Location: [facility];  Service: Orthopedics;  Laterality: Right;  . TOTAL HIP ARTHROPLASTY Left 07/17/2015   Procedure: LEFT TOTAL HIP ARTHROPLASTY ANTERIOR APPROACH;  Surgeon: Paralee Cancel, MD;  [facility];  Service: Orthopedics;  Laterality: Left;  . TOTAL HIP ARTHROPLASTY Right 08/21/2015   Procedure: RIGHT TOTAL HIP ARTHROPLASTY ANTERIOR APPROACH;  Surgeon: Paralee Cancel, MD;  [facility];  Service: Orthopedics;  Laterality: Right;  . TRANSTHORACIC ECHOCARDIOGRAM  12/30/2015   ef 02-58%, grade 1 diastolic dysfunction/  mild MR/ trivial TR  There were no vitals filed for this visit.  Subjective Assessment - 09/09/17 1018    Subjective  Pt reports that things are going well. She has been working on her exercises and feels that her range is much better. She still has some pain in her shoulder.     Currently in Pain?  Yes    Pain Score  5     Pain Location  Shoulder    Pain Orientation  Right upper trap region    Pain Descriptors / Indicators  Aching    Pain Type  Acute pain;Surgical pain    Pain Radiating Towards  none     Pain Onset  1 to 4 weeks ago    Pain Frequency  Constant    Aggravating Factors   nothing specific     Pain Relieving Factors  ice    Effect of Pain on Daily Activities   moderate to severe          [facility] PT Assessment - 09/09/17 0001      Assessment   Medical Diagnosis  Rt shoulder scope/RTC repair/biceps tenotomy    Referring Provider  Hart Robinsons, MD     Onset Date/Surgical Date  08/13/17    Hand Dominance  Right    Next MD Visit  09/23/17 approx    Prior Therapy  prior to surgery      Precautions   Precautions  Shoulder    Type of Shoulder Precautions  Rotator cuff repair       Balance Screen   Has the patient fallen in the past 6 months  No    Has the patient had a decrease in activity level because of a fear of falling?   No    Is the patient reluctant to leave their home because of a fear of falling?   No      Cognition   Overall Cognitive Status  No family/caregiver present to determine baseline cognitive functioning    Behaviors  Other (comment) pt lethargic and feeling sick throughout session       Observation/Other Assessments   Other Surveys   Other Surveys    Upper Extremity Functional Index   0/80      Posture/Postural Control   Posture Comments  Pt wearing wedged sling on RUE; Rt lateral cervical tilt/Rt shoulder shrug      PROM   Overall PROM Comments  AROM not tested due to surgical precautions     Right Shoulder Flexion  140 Degrees    Right Shoulder ABduction  110 Degrees    Right Shoulder External Rotation  20 Degrees ~20 deg abduction       Strength   Overall Strength Comments  Rt 20 lbs; Lt 30 lbs       Transfers   Comments  Pt needing MinA with transitions in/out of supine from sitting                   [facility] Adult PT Treatment/Exercise - 09/09/17 0001      Shoulder Exercises: Seated   Retraction  Both;15 reps;Limitations    Retraction Limitations  3 sec hold     Flexion  PROM;Right;10 reps;Limitations    Flexion Limitations  LUE assist, slide on table     Other Seated Exercises  shoulder rolls backward x20 reps  mirror feedback     Other Seated Exercises  Rt towel squeeze 50% effort x10  reps, 5 sec hold  Manual Therapy   Manual Therapy  Joint mobilization;Soft tissue mobilization    Joint Mobilization  Grade III Rt GH inferior and posterolateral mobilizations; Rt scapula mobilization     Soft tissue mobilization  Rt upper trap/levator scap     Passive ROM  Rt shoulder flexion/ER x5 reps each              PT Education - 09/09/17 1101    Education provided  Yes    Education Details  noted improvements in ROM; importance of allowing shoulder to heal with passive exercises until a couple of more weeks; updated and reviewed HEP    Person(s) Educated  Patient    Methods  Explanation;Handout;Tactile cues    Comprehension  Verbalized understanding;Returned demonstration       PT Short Term Goals - 09/09/17 1310      PT SHORT TERM GOAL #1   Title  Pt will demo consistency and independence with her HEP to decrease inflammation and improve shoulder ROM.    Time  2    Period  Weeks    Status  Achieved      PT SHORT TERM GOAL #2   Title  Pt will demo improved posture awareness evident by her ability to maintain upright posture without cervical tilt and without cuing from the therapist during her session.     Baseline  (+) shoulder shrug on the Rt    Time  2    Period  Weeks    Status  Partially Met      PT SHORT TERM GOAL #3   Title  Pt will be able to complete bed mobility activity such as transitioning sit <> supine with no more than SBA.     Time  2    Period  Weeks    Status  Achieved        PT Long Term Goals - 09/09/17 1311      PT LONG TERM GOAL #1   Title  Pt will be independent in advanced HEP to allow for continuous progression towards her goals.     Time  6    Period  Weeks    Status  Partially Met      PT LONG TERM GOAL #2   Title  Pt will demonstrate atleast a 9 point improvement in her UEFI to reflect and improvement in her UE functional use.     Time  6    Period  Weeks    Status  Partially Met      PT LONG TERM GOAL #3   Title   Pt will demo atleast 120 deg of passive Rt shoulder flexion to allow her to progress towards functional tasks when protocol allows.    Baseline  140    Time  6    Period  Weeks    Status  Achieved      PT LONG TERM GOAL #4   Title  Pt will report atleast 25% decrease in her pain and stiffness from the start of PT to allow for improved quality of life and daily activity tolerance.     Time  6    Period  Weeks    Status  Partially Met      PT LONG TERM GOAL #5   Title  Pt will demo improved Rt shoulder passive external rotation to atleast 60 deg to allow pt to reach above head once strength allows.     Baseline  35 deg  Time  6    Period  Weeks    Status  Partially Met      PT LONG TERM GOAL #6   Title  Pt will demo atleast 5lb improvement in Rt grip strength to increase in her independence with opening jars at home.     Baseline  10lb on the Rt     Time  6    Period  Weeks    Status  On-going            Plan - 09/09/17 1102    Clinical Impression Statement  Pt is making steady progress towards her goals, demonstrating significant improvements in her Rt shoulder passive ROM into flexion up to 140 deg, abduction to 110 deg and external rotation up to approximately 35 deg. She is able to complete bed mobility with decreased assistance, needing supervision at most, and she is consistently completing her HEP at home without reported difficulty. Pt does continue to report Rt shoulder pain both post-surgically and along the upper trap region secondary to noted shoulder shrug compensation throughout the day. In addition, pt also has limited scapular mobility and poor scapular control. Session focused on promoting shoulder and scapular mobility, improving pt awareness and understanding of post-op precautions, and updating her HEP with feedback to improve technique. Pt would benefit from continued skilled PT to address remaining limitations in shoulder ROM, progression of Rt shoulder  strength and improve posture control/strength and pt education throughout her recovery process.     Rehab Potential  Fair    Clinical Impairments Affecting Rehab Potential  (-) limited visits due to insurance     PT Frequency  3x / week    PT Duration  6 weeks    PT Treatment/Interventions  ADLs/Self Care Home Management;Cryotherapy;Electrical Stimulation;Moist Heat;Iontophoresis 71m/ml Dexamethasone;Therapeutic activities;Therapeutic exercise;Patient/family education;Orthotic Fit/Training;Neuromuscular re-education;Manual techniques;Taping;Dry needling;Passive range of motion;Scar mobilization    PT Next Visit Plan  progression of shoulder PROM, scapular mobilizations, scapula control/strength; MD 12/26    Consulted and Agree with Plan of Care  Patient       Patient will benefit from skilled therapeutic intervention in order to improve the following deficits and impairments:  Decreased activity tolerance, Impaired flexibility, Impaired UE functional use, Hypomobility, Decreased strength, Decreased range of motion, Decreased endurance, Decreased coordination, Decreased scar mobility, Increased muscle spasms, Postural dysfunction, Pain, Improper body mechanics  Visit Diagnosis: Stiffness of right shoulder, not elsewhere classified  Acute pain of right shoulder  Muscle weakness (generalized)  Abnormal posture     Problem List Patient Active Problem List   Diagnosis Date Noted  . S/P arthroscopy of right shoulder 08/13/2017  . Genetic testing 05/29/2017  . Family history of breast cancer   . Hyperlipidemia LDL goal <70 12/30/2015  . Cerebrovascular disease or lesion 12/30/2015  . [place] 12/30/2015  . TIA (transient ischemic attack) 12/28/2015  . GERD (gastroesophageal reflux disease) 07/23/2015  . Chronic pain 07/23/2015  . Obese 07/19/2015  . S/P left THA, AA 07/17/2015  . 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 Insomnia 03/03/2011  . OSA  (obstructive sleep apnea) 03/03/2011  . Depression, major, recurrent ([place] 12/02/2010  . CONSTIPATION, SLOW TRANSIT 10/04/2010  . IRRITABLE BOWEL SYNDROME 08/14/2009  . CYSTITIS, CHRONIC INTERSTITIAL 06/27/2009  . UNSPECIFIED HYPOTHYROIDISM 05/16/2009  . LIPOMA OF OTHER SPECIFIED SITES 04/13/2009  . BRUXISM 04/13/2009  . SYNCOPE 03/13/2009  . Abnormal finding on MRI of brain 02/07/2009  . MUSCLE WEAKNESS (GENERALIZED) 12/20/2008  . UNSPECIFIED ALLERGIC  ALVEOLITIS AND PNEUMONITIS 08/02/2008  . HIP PAIN, LEFT, CHRONIC 07/04/2008  . MALAISE AND FATIGUE 07/04/2008  . LOW BACK PAIN 05/26/2008  . PROTEINURIA 12/23/2007  . HYPERCALCEMIA 11/18/2007  . Essential hypertension 09/14/2007  . ANEMIA, B12 DEFICIENCY 05/18/2007  . 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 ALLERGIC RHINITIS 03/29/2007  . FIBROMYALGIA 03/29/2007    1:17 PM,09/09/17 Elly Modena PT, DPT [facility] [address], [place], [place], [address] Phone: [phone]   Fax:  [phone]  Name: Kristin Pope MRN: [id number] Date of Birth: 1956/06/10

## 2017-09-09 NOTE — Patient Instructions (Signed)
  SHOULDER ROLLS  Move your shoulders in a circular pattern as shown so that your are moving in an up, back and down direction. Perform small cicles if needed for comfort.  x20 reps       SCAPULAR RETRACTIONS  Draw your shoulder blades back and down.  x3 sec hold, 15 reps     TOWEL GRIP  Place a rolled up towel in your hand and squeeze.    50% effort, hold 5 sec. Repeat 10-15x      Passive Shoulder Flexion  Standing in front of a high surface, place elbow on table. Move body backwards, passively bringing arm overhead. Shoulder muscles should remain relaxed throughout entire range of motion.  x10-15 reps    [facility] [address] [place], [place] [address] Phone # [phone] Fax [phone]

## 2017-09-14 DIAGNOSIS — N938 Other specified abnormal uterine and vaginal bleeding: Secondary | ICD-10-CM

## 2017-09-14 DIAGNOSIS — N950 Postmenopausal bleeding: Secondary | ICD-10-CM

## 2017-09-14 HISTORY — DX: Other specified abnormal uterine and vaginal bleeding: N93.8

## 2017-09-14 HISTORY — DX: Postmenopausal bleeding: N95.0

## 2017-09-17 ENCOUNTER — Ambulatory Visit: Payer: Medicaid Other | Admitting: Physical Therapy

## 2017-09-17 ENCOUNTER — Encounter: Payer: Self-pay | Admitting: Physical Therapy

## 2017-09-17 DIAGNOSIS — M6281 Muscle weakness (generalized): Secondary | ICD-10-CM

## 2017-09-17 DIAGNOSIS — R293 Abnormal posture: Secondary | ICD-10-CM

## 2017-09-17 DIAGNOSIS — M25511 Pain in right shoulder: Secondary | ICD-10-CM

## 2017-09-17 DIAGNOSIS — M25611 Stiffness of right shoulder, not elsewhere classified: Secondary | ICD-10-CM

## 2017-09-17 NOTE — Therapy (Signed)
[facility] [address], [place], [place], [address] Phone: [phone]   Fax:  [phone]  Physical Therapy Treatment  Patient Details  Name: Kristin Pope MRN: [id number] Date of Birth: 1956-03-22 Referring Provider: Dr. Hart Robinsons   Encounter Date: 09/17/2017  PT End of Session - 09/17/17 1317    Visit Number  5    Date for PT Re-Evaluation  09/30/17    Authorization Type  09/14/2017-09/27/2017 6 visit    Authorization - Visit Number  1    Authorization - Number of Visits  6    PT Start Time  1230    PT Stop Time  1310    PT Time Calculation (min)  40 min    Activity Tolerance  Patient tolerated treatment well    Behavior During Therapy  [facility] for tasks assessed/performed       Past Medical History:  Diagnosis Date  . Allergic rhinitis   . Anemia, pernicious    b12 def.  . Anxiety   . Arthritis   . Biceps tendon tear    right  . Cervical spondylosis with radiculopathy    C2 -- C7  . Chronic fatigue   . Chronic pain    neck, back  . DDD (degenerative disc disease), cervical   . DDD (degenerative disc disease), lumbosacral   . Depression, major, recurrent ([place])   . Diverticulosis of colon   . Eczema   . Family history of adverse reaction to anesthesia    sister has problems waking up  . Family history of breast cancer   . Fibromyalgia 03/29/2007  . GERD (gastroesophageal reflux disease)   . Headache    constant headaches  . History of colonic diverticulitis 08/16/2010   w/ perforation (lower GI bleed)--- resolved without surgerical intervention  . 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 Hx of pyelonephritis 09/2005   due to UTI  . Hypertension   . IBS (irritable bowel syndrome)    internates between constpation/ diarrhea  . Lumbosacral spondylosis    L2-3, L4-5  . Mild obstructive sleep apnea    per study  06/ 2009 mild osa  AHI 13/hr---  recommendation given mouth appliance, loss wt., cpap  . Mixed hyperlipidemia   . OA (osteoarthritis)    right shoulder AC joint  . Pre-diabetes   . Right rotator cuff tear     Past Surgical History:  Procedure Laterality Date  . BREAST SURGERY     breast biopsy-benign  . COLONOSCOPY  last one 08-08-2009  . DILATATION & CURRETTAGE/HYSTEROSCOPY WITH RESECTOCOPE  02-03-2011   dr Dellis Filbert  [facility]   polypectomy  . RADIAL OPTIC NEUROTOMY     twice in lumbar area of back-every 6 months  . SHOULDER ARTHROSCOPY WITH ROTATOR CUFF REPAIR Right 08/13/2017   Procedure: RIGHT SHOULDER ARTHROSCOPY, DEBRIDEMENT, BICEPS TENOTOMY, ROTATOR CUFF REPAIR, DISTAL CLAVICLE RESECTION;  Surgeon: Sydnee Cabal, MD;  Location: [place];  Service: Orthopedics;  Laterality: Right;  . TOTAL HIP ARTHROPLASTY Left 07/17/2015   Procedure: LEFT TOTAL HIP ARTHROPLASTY ANTERIOR APPROACH;  Surgeon: Paralee Cancel, MD;  [facility];  Service: Orthopedics;  Laterality: Left;  . TOTAL HIP ARTHROPLASTY Right 08/21/2015   Procedure: RIGHT TOTAL HIP ARTHROPLASTY ANTERIOR APPROACH;  Surgeon: Paralee Cancel, MD;  [facility];  Service: Orthopedics;  Laterality: Right;  . TRANSTHORACIC ECHOCARDIOGRAM  12/30/2015   ef 60-65%,  grade 1 diastolic dysfunction/  mild MR/ trivial TR    There were no vitals filed for this visit.  Subjective Assessment - 09/17/17 1234    Limitations  Other (comment) usign right UE    Patient Stated Goals  improve shoulder ROM, strength     Currently in Pain?  Yes    Pain Score  5     Pain Location  Shoulder    Pain Orientation  Right    Pain Descriptors / Indicators  Aching    Pain Type  Acute pain;Surgical pain    Pain Onset  1 to 4 weeks ago    Pain Frequency  Constant    Aggravating Factors   nothing specific    Pain Relieving Factors  ice    Multiple Pain Sites  No         [facility] PT Assessment - 09/17/17 0001      Assessment    Medical Diagnosis  Rt shoulder scope/RTC repair/biceps tenotomy    Referring Provider  Dr. Hart Robinsons    Onset Date/Surgical Date  08/13/17    Hand Dominance  Right    Next MD Visit  09/23/17 approx    Prior Therapy  prior to surgery      Precautions   Precautions  Shoulder    Type of Shoulder Precautions  Rotator cuff repair       Restrictions   Weight Bearing Restrictions  No      [place] residence      Prior Function   Level of Independence  Independent      PROM   Right Shoulder Flexion  140 Degrees      Strength   Overall Strength Comments  Rt 20 lbs; Lt 30 lbs                   [facility] Adult PT Treatment/Exercise - 09/17/17 0001      Shoulder Exercises: Supine   External Rotation  AAROM;Right;20 reps with arm at side    Flexion  AAROM;Right;20 reps    ABduction  AAROM;Right;20 reps going to 90 degrees    Other Supine Exercises  scapula movement up/down, retraction and protraction      Shoulder Exercises: Seated   Retraction  Both;15 reps;Limitations    Retraction Limitations  3 sec hold     Flexion  PROM;Right;10 reps;Limitations    Flexion Limitations  LUE assist, slide on table       Shoulder Exercises: Standing   Other Standing Exercises  hand on cloth with table at hip height moving forward and back, side to side, circulars, shoulder shrug,                PT Short Term Goals - 09/09/17 1310      PT SHORT TERM GOAL #1   Title  Pt will demo consistency and independence with her HEP to decrease inflammation and improve shoulder ROM.    Time  2    Period  Weeks    Status  Achieved      PT SHORT TERM GOAL #2   Title  Pt will demo improved posture awareness evident by her ability to maintain upright posture without cervical tilt and without cuing from the therapist during her session.     Baseline  (+) shoulder shrug on the Rt    Time  2    Period  Weeks    Status  Partially Met  PT SHORT  TERM GOAL #3   Title  Pt will be able to complete bed mobility activity such as transitioning sit <> supine with no more than SBA.     Time  2    Period  Weeks    Status  Achieved        PT Long Term Goals - 09/09/17 1311      PT LONG TERM GOAL #1   Title  Pt will be independent in advanced HEP to allow for continuous progression towards her goals.     Time  6    Period  Weeks    Status  Partially Met      PT LONG TERM GOAL #2   Title  Pt will demonstrate atleast a 9 point improvement in her UEFI to reflect and improvement in her UE functional use.     Time  6    Period  Weeks    Status  Partially Met      PT LONG TERM GOAL #3   Title  Pt will demo atleast 120 deg of passive Rt shoulder flexion to allow her to progress towards functional tasks when protocol allows.    Baseline  140    Time  6    Period  Weeks    Status  Achieved      PT LONG TERM GOAL #4   Title  Pt will report atleast 25% decrease in her pain and stiffness from the start of PT to allow for improved quality of life and daily activity tolerance.     Time  6    Period  Weeks    Status  Partially Met      PT LONG TERM GOAL #5   Title  Pt will demo improved Rt shoulder passive external rotation to atleast 60 deg to allow pt to reach above head once strength allows.     Baseline  35 deg     Time  6    Period  Weeks    Status  Partially Met      PT LONG TERM GOAL #6   Title  Pt will demo atleast 5lb improvement in Rt grip strength to increase in her independence with opening jars at home.     Baseline  10lb on the Rt     Time  6    Period  Weeks    Status  On-going            Plan - 09/17/17 1318    Clinical Impression Statement  Patient is able to move her scapula in correct pattern.  Patient is able to flex her right shoulder AAROM with good scapula mobility.  Patient needed breaks with therapy due to using her muscles more.  Paitent pain is low due to her taking pain medicaiton. Patient is  seeing the MD on 09/23/2017.  Patient will continue with skilled PT to address reamining limitations in shoulder ROM, progresion of right shoulder strength and improve posture control/strength and PT education throughout her recovery process.     Rehab Potential  Fair    Clinical Impairments Affecting Rehab Potential  (-) limited visits due to insurance     PT Frequency  3x / week    PT Duration  6 weeks    PT Treatment/Interventions  ADLs/Self Care Home Management;Cryotherapy;Electrical Stimulation;Moist Heat;Iontophoresis 85m/ml Dexamethasone;Therapeutic activities;Therapeutic exercise;Patient/family education;Orthotic Fit/Training;Neuromuscular re-education;Manual techniques;Taping;Dry needling;Passive range of motion;Scar mobilization    PT Next Visit Plan   MD 12/26; begin  shoulder isometrics, cane exercises for HEP; pendulums for HEP, AAROM for shoulder flexion, abduction and ER; scapula strength with manual resistance; needs a renewal and more medicaid coverage for the new year    PT Home Exercise Plan  update frequently as able due to limited visits    Consulted and Agree with Plan of Care  Patient       Patient will benefit from skilled therapeutic intervention in order to improve the following deficits and impairments:  Decreased activity tolerance, Impaired flexibility, Impaired UE functional use, Hypomobility, Decreased strength, Decreased range of motion, Decreased endurance, Decreased coordination, Decreased scar mobility, Increased muscle spasms, Postural dysfunction, Pain, Improper body mechanics  Visit Diagnosis: Stiffness of right shoulder, not elsewhere classified  Acute pain of right shoulder  Muscle weakness (generalized)  Abnormal posture     Problem List Patient Active Problem List   Diagnosis Date Noted  . S/P arthroscopy of right shoulder 08/13/2017  . Genetic testing 05/29/2017  . Family history of breast cancer   . Hyperlipidemia LDL goal <70 12/30/2015  .  Cerebrovascular disease or lesion 12/30/2015  . Mild pulmonary hypertension ([place]) 12/30/2015  . TIA (transient ischemic attack) 12/28/2015  . GERD (gastroesophageal reflux disease) 07/23/2015  . Chronic pain 07/23/2015  . Obese 07/19/2015  . S/P left THA, AA 07/17/2015  . 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 Insomnia 03/03/2011  . OSA (obstructive sleep apnea) 03/03/2011  . Depression, major, recurrent ([place]) 12/02/2010  . CONSTIPATION, SLOW TRANSIT 10/04/2010  . IRRITABLE BOWEL SYNDROME 08/14/2009  . CYSTITIS, CHRONIC INTERSTITIAL 06/27/2009  . UNSPECIFIED HYPOTHYROIDISM 05/16/2009  . LIPOMA OF OTHER SPECIFIED SITES 04/13/2009  . BRUXISM 04/13/2009  . SYNCOPE 03/13/2009  . Abnormal finding on MRI of brain 02/07/2009  . MUSCLE WEAKNESS (GENERALIZED) 12/20/2008  . UNSPECIFIED ALLERGIC ALVEOLITIS AND PNEUMONITIS 08/02/2008  . HIP PAIN, LEFT, CHRONIC 07/04/2008  . MALAISE AND FATIGUE 07/04/2008  . LOW BACK PAIN 05/26/2008  . PROTEINURIA 12/23/2007  . HYPERCALCEMIA 11/18/2007  . Essential hypertension 09/14/2007  . ANEMIA, B12 DEFICIENCY 05/18/2007  . 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 ALLERGIC RHINITIS 03/29/2007  . FIBROMYALGIA 03/29/2007    Earlie Counts, PT 09/17/17 1:26 PM   [facility] [address], [place], [place], [address] Phone: [phone]   Fax:  [phone]  Name: Kristin Pope MRN: [id number] Date of Birth: 06-Jul-1956

## 2017-09-24 ENCOUNTER — Ambulatory Visit: Payer: Medicaid Other | Admitting: Physical Therapy

## 2017-09-24 ENCOUNTER — Encounter: Payer: Self-pay | Admitting: Physical Therapy

## 2017-09-24 DIAGNOSIS — M6281 Muscle weakness (generalized): Secondary | ICD-10-CM

## 2017-09-24 DIAGNOSIS — R293 Abnormal posture: Secondary | ICD-10-CM

## 2017-09-24 DIAGNOSIS — M25511 Pain in right shoulder: Secondary | ICD-10-CM

## 2017-09-24 DIAGNOSIS — M25611 Stiffness of right shoulder, not elsewhere classified: Secondary | ICD-10-CM

## 2017-09-24 NOTE — Therapy (Signed)
[facility] [address], [place], [place], [address] Phone: [phone]   Fax:  [phone]  Physical Therapy Treatment  Patient Details  Name: Kristin Pope MRN: [id number] Date of Birth: 1955/12/23 Referring Provider: Dr. Hart Robinsons   Encounter Date: 09/24/2017  PT End of Session - 09/24/17 1604    Visit Number  6 including eval    Date for PT Re-Evaluation  09/30/17    Authorization Type  09/14/2017-09/27/2017 Eval plus 6 visit    PT Start Time  1521    PT Stop Time  1620    PT Time Calculation (min)  59 min    Activity Tolerance  Patient tolerated treatment well       Past Medical History:  Diagnosis Date  . Allergic rhinitis   . Anemia, pernicious    b12 def.  . Anxiety   . Arthritis   . Biceps tendon tear    right  . Cervical spondylosis with radiculopathy    C2 -- C7  . Chronic fatigue   . Chronic pain    neck, back  . DDD (degenerative disc disease), cervical   . DDD (degenerative disc disease), lumbosacral   . Depression, major, recurrent ([place])   . Diverticulosis of colon   . Eczema   . Family history of adverse reaction to anesthesia    sister has problems waking up  . Family history of breast cancer   . Fibromyalgia 03/29/2007  . GERD (gastroesophageal reflux disease)   . Headache    constant headaches  . History of colonic diverticulitis 08/16/2010   w/ perforation (lower GI bleed)--- resolved without surgerical intervention  . 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 Hx of pyelonephritis 09/2005   due to UTI  . Hypertension   . IBS (irritable bowel syndrome)    internates between constpation/ diarrhea  . Lumbosacral spondylosis    L2-3, L4-5  . Mild obstructive sleep apnea    per study 06/ 2009 mild osa  AHI 13/hr---  recommendation given mouth appliance, loss wt., cpap  . Mixed hyperlipidemia   .  OA (osteoarthritis)    right shoulder AC joint  . Pre-diabetes   . Right rotator cuff tear     Past Surgical History:  Procedure Laterality Date  . BREAST SURGERY     breast biopsy-benign  . COLONOSCOPY  last one 08-08-2009  . DILATATION & CURRETTAGE/HYSTEROSCOPY WITH RESECTOCOPE  02-03-2011   dr Dellis Filbert  [facility]   polypectomy  . RADIAL OPTIC NEUROTOMY     twice in lumbar area of back-every 6 months  . SHOULDER ARTHROSCOPY WITH ROTATOR CUFF REPAIR Right 08/13/2017   Procedure: RIGHT SHOULDER ARTHROSCOPY, DEBRIDEMENT, BICEPS TENOTOMY, ROTATOR CUFF REPAIR, DISTAL CLAVICLE RESECTION;  Surgeon: Sydnee Cabal, MD;  Location: [place];  Service: Orthopedics;  Laterality: Right;  . TOTAL HIP ARTHROPLASTY Left 07/17/2015   Procedure: LEFT TOTAL HIP ARTHROPLASTY ANTERIOR APPROACH;  Surgeon: Paralee Cancel, MD;  [facility];  Service: Orthopedics;  Laterality: Left;  . TOTAL HIP ARTHROPLASTY Right 08/21/2015   Procedure: RIGHT TOTAL HIP ARTHROPLASTY ANTERIOR APPROACH;  Surgeon: Paralee Cancel, MD;  [facility];  Service: Orthopedics;  Laterality: Right;  . TRANSTHORACIC ECHOCARDIOGRAM  12/30/2015   ef 03-49%, grade 1 diastolic dysfunction/  mild MR/ trivial TR    There were no vitals filed for this visit.  Subjective Assessment - 09/24/17 1521  Subjective  Saw the doctor yesterday and I don't have to wear the sling anymore and I can drive now.  Drove herself to PT today.  Wearing sling.      Pertinent History  MD in Feb.      Currently in Pain?  Yes    Pain Score  3     Pain Location  Shoulder    Pain Orientation  Right                      [facility] Adult PT Treatment/Exercise - 09/24/17 0001      Shoulder Exercises: Supine   Flexion  AAROM;Right attempted with cane but patient does not feel she can do      Shoulder Exercises: Seated   Other Seated Exercises  UE Ranger on floor forward/backward,,side to side,  circles 10x each      Shoulder  Exercises: Isometric Strengthening   Flexion  5X5"    Extension  5X5"    External Rotation  5X5"    Internal Rotation  5X5"    ABduction  5X5"      Moist Heat Therapy   Number Minutes Moist Heat  15 Minutes    Moist Heat Location  Shoulder      Electrical Stimulation   Electrical Stimulation Location  RT shoulder, scapula    Electrical Stimulation Action  IFC    Electrical Stimulation Parameters  11 ma 15 min    Electrical Stimulation Goals  Pain      Manual Therapy   Soft tissue mobilization  right upper trap, cervical musculature for pain relief     Passive ROM  Right shoulder flexion, small arc abduction, External rotation 10x each               PT Short Term Goals - 09/24/17 1613      PT SHORT TERM GOAL #1   Title  Pt will demo consistency and independence with her HEP to decrease inflammation and improve shoulder ROM.    Status  Achieved      PT SHORT TERM GOAL #2   Title  Pt will demo improved posture awareness evident by her ability to maintain upright posture without cervical tilt and without cuing from the therapist during her session.     Status  Achieved      PT SHORT TERM GOAL #3   Title  Pt will be able to complete bed mobility activity such as transitioning sit <> supine with no more than SBA.     Status  Achieved        PT Long Term Goals - 09/24/17 1616      PT LONG TERM GOAL #1   Title  Pt will be independent in advanced HEP to allow for continuous progression towards her goals.     Time  6    Period  Weeks    Status  Partially Met      PT LONG TERM GOAL #2   Title  Pt will demonstrate atleast a 9 point improvement in her UEFI to reflect and improvement in her UE functional use.     Time  6    Period  Weeks    Status  Partially Met      PT LONG TERM GOAL #3   Title  Pt will demo atleast 120 deg of passive Rt shoulder flexion to allow her to progress towards functional tasks when protocol allows.    Status  Achieved  PT LONG TERM  GOAL #4   Title  Pt will report atleast 25% decrease in her pain and stiffness from the start of PT to allow for improved quality of life and daily activity tolerance.     Time  6    Period  Weeks    Status  Partially Met      PT LONG TERM GOAL #5   Title  Pt will demo improved Rt shoulder passive external rotation to atleast 60 deg to allow pt to reach above head once strength allows.     Time  6    Period  Weeks    Status  Partially Met      PT LONG TERM GOAL #6   Title  Pt will demo atleast 5lb improvement in Rt grip strength to increase in her independence with opening jars at home.     Time  6    Period  Weeks    Status  On-going            Plan - 09/24/17 1605    Clinical Impression Statement  The patient complains of neck/upper trap region pain upon arrival.  She reports pain relief with soft tissue manual therapy but she reports ROM is generally painful.  She declines attempts for cane active assisted ROM secondary to pain.  Able to do UE Ranger on the floor and submax isometrics with pain level no higher than 5/10.  Good pain relief with electrical stimulation and heat.  Anticipate slower progress secondary to fibromyalgia.      Rehab Potential  Fair    Clinical Impairments Affecting Rehab Potential  (-) limited visits due to insurance     PT Frequency  3x / week    PT Duration  6 weeks    PT Treatment/Interventions  ADLs/Self Care Home Management;Cryotherapy;Electrical Stimulation;Moist Heat;Iontophoresis 67m/ml Dexamethasone;Therapeutic activities;Therapeutic exercise;Patient/family education;Orthotic Fit/Training;Neuromuscular re-education;Manual techniques;Taping;Dry needling;Passive range of motion;Scar mobilization    PT Next Visit Plan   last visit under Medicaid authorization;  assess progress with LTGs, so UEFI and check grip strength;  shoulder isometrics, cane exercises for HEP; pendulums for HEP, AAROM for shoulder flexion, abduction and ER; scapula strength with  manual resistance; needs a renewal and more medicaid coverage for the new year       Patient will benefit from skilled therapeutic intervention in order to improve the following deficits and impairments:  Decreased activity tolerance, Impaired flexibility, Impaired UE functional use, Hypomobility, Decreased strength, Decreased range of motion, Decreased endurance, Decreased coordination, Decreased scar mobility, Increased muscle spasms, Postural dysfunction, Pain, Improper body mechanics  Visit Diagnosis: Stiffness of right shoulder, not elsewhere classified  Acute pain of right shoulder  Muscle weakness (generalized)  Abnormal posture     Problem List Patient Active Problem List   Diagnosis Date Noted  . S/P arthroscopy of right shoulder 08/13/2017  . Genetic testing 05/29/2017  . Family history of breast cancer   . Hyperlipidemia LDL goal <70 12/30/2015  . Cerebrovascular disease or lesion 12/30/2015  . [place] pulmonary hypertension [place] 12/30/2015  . TIA (transient ischemic attack) 12/28/2015  . GERD (gastroesophageal reflux disease) 07/23/2015  . Chronic pain 07/23/2015  . Obese 07/19/2015  . S/P left THA, AA 07/17/2015  . 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 Insomnia 03/03/2011  . OSA (obstructive sleep apnea) 03/03/2011  . Depression, major, recurrent ([place] 12/02/2010  . CONSTIPATION, SLOW TRANSIT 10/04/2010  . IRRITABLE BOWEL SYNDROME 08/14/2009  . CYSTITIS, CHRONIC INTERSTITIAL 06/27/2009  . UNSPECIFIED  HYPOTHYROIDISM 05/16/2009  . LIPOMA OF OTHER SPECIFIED SITES 04/13/2009  . BRUXISM 04/13/2009  . SYNCOPE 03/13/2009  . Abnormal finding on MRI of brain 02/07/2009  . MUSCLE WEAKNESS (GENERALIZED) 12/20/2008  . UNSPECIFIED ALLERGIC ALVEOLITIS AND PNEUMONITIS 08/02/2008  . HIP PAIN, LEFT, CHRONIC 07/04/2008  . MALAISE AND FATIGUE 07/04/2008  . LOW BACK PAIN 05/26/2008  . PROTEINURIA 12/23/2007  . HYPERCALCEMIA 11/18/2007  . Essential hypertension  09/14/2007  . ANEMIA, B12 DEFICIENCY 05/18/2007  . 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 ALLERGIC RHINITIS 03/29/2007  . FIBROMYALGIA 03/29/2007   Ruben Im, PT 09/24/17 4:23 PM Phone: [phone] Fax: [phone]  Kristin Pope 09/24/2017, 4:22 PM  [facility] [address] [place], [place], [address] Phone: [phone]   Fax:  [phone]  Name: Kristin Pope MRN: [id number] Date of Birth: [birth year]

## 2017-09-25 ENCOUNTER — Ambulatory Visit: Payer: Medicaid Other | Admitting: Physical Therapy

## 2017-09-25 DIAGNOSIS — M25611 Stiffness of right shoulder, not elsewhere classified: Secondary | ICD-10-CM

## 2017-09-25 DIAGNOSIS — M25511 Pain in right shoulder: Secondary | ICD-10-CM

## 2017-09-25 DIAGNOSIS — R293 Abnormal posture: Secondary | ICD-10-CM

## 2017-09-25 DIAGNOSIS — M6281 Muscle weakness (generalized): Secondary | ICD-10-CM

## 2017-09-25 NOTE — Patient Instructions (Signed)
  Supine Shoulder Flexion with Wand  Start by lying on your back holding a wand (or broomstick handle) with arms down by your sides. Keeping arms straight, lift wand up above and behind your head until you feel a stretch, and hold 5 sec, repeat 15x     Cane Supine Shoulder Abduction Stretch  Lie on your back and grasp the cane on the shaft with the arm to be stretched. Grasp the bottom with the other hand and push your arm (the one to be stretched) up and away. Hold the stretch for 5 seconds before returning to the resting position.    Repeat 15x       Standing External Rotation w/ Cane  Begin with hand in line with elbow. Elbow is at a 90 degree angle with the shoulder. Use cane to help move hand into external rotation. Keep elbow at side.  Hold 5 sec, repeat 15x    2x/day all exercises   [facility] [address] [place], [place] [address] Phone # [phone] Fax [phone]

## 2017-09-25 NOTE — Therapy (Signed)
[facility] [address] [place], [place], [address] Phone: [phone]   Fax:  [phone]  Physical Therapy TreatmentReassessment  Patient Details  Name: Kristin Pope MRN: [id number] Date of Birth: 07/07/1956 Referring Provider: Hart Robinsons, MD    Encounter Date: 09/25/2017  PT End of Session - 09/25/17 0940    Visit Number  7 including eval    Date for PT Re-Evaluation  09/30/17    Authorization Type  09/28/17 to 11/09/17 (pending medicaid approval)    Authorization Time Period  Medicaid approval:     Authorization - Visit Number  0    Authorization - Number of Visits  3    PT Start Time  0932    PT Stop Time  1022    PT Time Calculation (min)  50 min    Activity Tolerance  Patient tolerated treatment well;No increased pain    Behavior During Therapy  WFL for tasks assessed/performed       Past Medical History:  Diagnosis Date  . Allergic rhinitis   . Anemia, pernicious    b12 def.  . Anxiety   . Arthritis   . Biceps tendon tear    right  . Cervical spondylosis with radiculopathy    C2 -- C7  . Chronic fatigue   . Chronic pain    neck, back  . DDD (degenerative disc disease), cervical   . DDD (degenerative disc disease), lumbosacral   . Depression, major, recurrent ([place])   . Diverticulosis of colon   . Eczema   . Family history of adverse reaction to anesthesia    sister has problems waking up  . Family history of breast cancer   . Fibromyalgia 03/29/2007  . GERD (gastroesophageal reflux disease)   . Headache    constant headaches  . History of colonic diverticulitis 08/16/2010   w/ perforation (lower GI bleed)--- resolved without surgerical intervention  . 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 Hx of pyelonephritis 09/2005   due to UTI  . Hypertension   . IBS (irritable bowel syndrome)    internates between  constpation/ diarrhea  . Lumbosacral spondylosis    L2-3, L4-5  . Mild obstructive sleep apnea    per study 06/ 2009 mild osa  AHI 13/hr---  recommendation given mouth appliance, loss wt., cpap  . Mixed hyperlipidemia   . OA (osteoarthritis)    right shoulder AC joint  . Pre-diabetes   . Right rotator cuff tear     Past Surgical History:  Procedure Laterality Date  . BREAST SURGERY     breast biopsy-benign  . COLONOSCOPY  last one 08-08-2009  . DILATATION & CURRETTAGE/HYSTEROSCOPY WITH RESECTOCOPE  02-03-2011   dr Dellis Filbert  [facility]   polypectomy  . RADIAL OPTIC NEUROTOMY     twice in lumbar area of back-every 6 months  . SHOULDER ARTHROSCOPY WITH ROTATOR CUFF REPAIR Right 08/13/2017   Procedure: RIGHT SHOULDER ARTHROSCOPY, DEBRIDEMENT, BICEPS TENOTOMY, ROTATOR CUFF REPAIR, DISTAL CLAVICLE RESECTION;  Surgeon: Sydnee Cabal, MD;  Location: [facility];  Service: Orthopedics;  Laterality: Right;  . TOTAL HIP ARTHROPLASTY Left 07/17/2015   Procedure: LEFT TOTAL HIP ARTHROPLASTY ANTERIOR APPROACH;  Surgeon: Paralee Cancel, MD;  Location[facility];  Service: Orthopedics;  Laterality: Left;  . TOTAL HIP ARTHROPLASTY Right 08/21/2015   Procedure: RIGHT TOTAL HIP ARTHROPLASTY ANTERIOR APPROACH;  Surgeon: Paralee Cancel, MD;  Location:  [facility];  Service: Orthopedics;  Laterality: Right;  . TRANSTHORACIC ECHOCARDIOGRAM  12/30/2015   ef 74-08%, grade 1 diastolic dysfunction/  mild MR/ trivial TR    There were no vitals filed for this visit.  Subjective Assessment - 09/25/17 0935    Subjective  Pt reports that things are going well, but she is a little sore following yesterday's session. She did not sleep with her sling last night which seemed to go well.     Pertinent History  MD in Feb.      Currently in Pain?  Yes    Pain Score  6     Pain Location  Shoulder    Pain Orientation  Right    Pain Descriptors / Indicators  Aching    Pain Type  Acute pain    Pain Radiating Towards  none      Pain Onset  1 to 4 weeks ago    Aggravating Factors   nothing specific     Pain Relieving Factors  ice    Effect of Pain on Daily Activities  moderate to severe    Multiple Pain Sites  No         [facility] PT Assessment - 09/25/17 0001      Assessment   Medical Diagnosis  Rt shoulder scope/RTC repair/biceps tenotomy    Referring Provider  Hart Robinsons, MD     Onset Date/Surgical Date  08/13/17    Hand Dominance  Right    Next MD Visit  in ~6 weeks  approx    Prior Therapy  prior to surgery      Precautions   Precautions  Shoulder    Type of Shoulder Precautions  Rotator cuff repair     Precaution Comments  Sling discontinued      Restrictions   Weight Bearing Restrictions  No      Balance Screen   Has the patient fallen in the past 6 months  No    Has the patient had a decrease in activity level because of a fear of falling?   No    Is the patient reluctant to leave their home because of a fear of falling?   No      Home Environment   Living Environment  Private residence      Prior Function   Level of Independence  Independent      Observation/Other Assessments   Upper Extremity Functional Index   14/80      Posture/Postural Control   Posture Comments  Pt arrived wearing her sling       PROM   Right Shoulder Flexion  155 Degrees    Right Shoulder ABduction  140 Degrees    Right Shoulder External Rotation  40 Degrees arm 30 deg abducted; at 90 deg abduction: 70 deg ER      Strength   Overall Strength Comments  Rt 30 lbs; Lt 32 lbs       Transfers   Comments  Pt able to complete bed mobility ModI                   [facility] Adult PT Treatment/Exercise - 09/25/17 0001      Shoulder Exercises: Supine   Flexion  AAROM;Both;10 reps HEP demo, elbows bent during return     ABduction  AAROM;Right;10 reps HEP demo       Shoulder Exercises: Standing   External Rotation  AAROM;Right;Other (comment) x3 min  Shoulder Exercises: Pulleys   Flexion  3  minutes    ABduction  3 minutes    Other Pulley Exercises  IR x3 min  standing              PT Education - 09/25/17 1027    Education provided  Yes    Education Details  importance of completing HEP atleast 1x/day regardless of frequent PT sessions to allow for consistent progress towards goals; updated HEP    Person(s) Educated  Patient    Methods  Demonstration;Verbal cues;Handout;Explanation    Comprehension  Verbalized understanding;Need further instruction Needs further instruction with HEP AAROM        PT Short Term Goals - 09/25/17 0941      PT SHORT TERM GOAL #1   Title  Pt will demo consistency and independence with her HEP to decrease inflammation and improve shoulder ROM.    Time  4    Period  Weeks    Status  New    Target Date  10/26/17      PT SHORT TERM GOAL #2   Title  Pt will demo full Rt shoulder PROM into flexion and abduction to 160 deg, Rt shoulder IR atleast 60 deg to allow for optimal strengthening progression.     Time  4    Period  Weeks    Status  New      PT SHORT TERM GOAL #3   Title  Pt will demo pain AAROM in an upright postion with little to no pain and atleast 160 deg of shoulder elevation.    Time  4    Period  Weeks    Status  New      PT SHORT TERM GOAL #4   Title  Pts Rt grip strength will be atleast 5lb stronger than on the Lt, to allow for her to complete more fine motor activities in the kitchen with minimal difficulty.     Time  4    Period  Weeks    Status  New        PT Long Term Goals - 09/25/17 0941      PT LONG TERM GOAL #1   Title  Pt will be independent in advanced HEP to allow for continuous progression towards her goals.     Time  8    Period  Weeks    Status  On-going    Target Date  11/09/17      PT LONG TERM GOAL #2   Title  Pt will demonstrate atleast a 9 point improvement in her UEFI to reflect and improvement in her UE functional use.     Baseline  14/80    Time  8    Period  Weeks    Status   On-going      PT LONG TERM GOAL #3   Title  Pt will demo atleast 120 deg of active Rt shoulder flexion to allow her to reach over head into her cabinet without difficulty.     Baseline  unable     Time  8    Status  Achieved      PT LONG TERM GOAL #4   Title  Pt will report atleast 50% decrease in her pain and stiffness from the start of PT to allow for improved quality of life and daily activity tolerance.     Baseline  30%    Time  8    Period  Weeks  Status  Revised      PT LONG TERM GOAL #5   Title  Pt will demo improved shoulder strength and endurance evident by her report of being able to successfully wash her hair without difficulty.     Time  8    Period  Weeks    Status  New      PT LONG TERM GOAL #6   Title  --    Time  --    Period  --    Status  --            Plan - 09/25/17 1030    Clinical Impression Statement  Pt continues to make steady progress towards her goals, meeting all of her short and all but one of her long term goals since beginning PT several weeks ago. She reports atleast 30% improvement in her shoulder pain from the start of therapy, and demonstrates improved Rt shoulder passive ROM up to 155 deg flexion, 140 deg abduction and 70 deg external rotation at 90 deg abduction. Her grip strength is also 10 lb improved from the start of therapy, although it continues to be less than her strength on the Lt. Pt does lack end ranges of passive shoulder flexion/abduction/external rotation, and is progressing to more active assisted ROM exercises over the past couple of visits. She also continues to have Rt shoulder pain and soft tissue restrictions along the rotator cuff musculature which is making sleeping somewhat difficult. Her score on the upper extremity functional index measure improved to 14 points out of 80, however this reflects that she still has significant functional limitations in Rt shoulder ROM, strength and use. She would benefit from an  extension of skilled PT to allow for continued steady progression of strength, ROM and endurance of the Rt shoulder in order to improve her independence with daily activity around the home and community.     Rehab Potential  Fair    Clinical Impairments Affecting Rehab Potential  (-) limited visits due to insurance     PT Frequency  3x / week    PT Duration  6 weeks    PT Treatment/Interventions  ADLs/Self Care Home Management;Cryotherapy;Electrical Stimulation;Moist Heat;Iontophoresis 4mg /ml Dexamethasone;Therapeutic activities;Therapeutic exercise;Patient/family education;Orthotic Fit/Training;Neuromuscular re-education;Manual techniques;Taping;Dry needling;Passive range of motion;Scar mobilization    PT Next Visit Plan  progression of AAROM and begin AROM when able with minimal pain and proper scapular control; scapular strengthening; tricep/bicep strengthening; manual/modalities as needed for pain control    PT Home Exercise Plan  update frequently as able due to limited visits: 09/25/17 AAROM in supine flexion/abduction, standing ER AAROM    Consulted and Agree with Plan of Care  Patient       Patient will benefit from skilled therapeutic intervention in order to improve the following deficits and impairments:  Decreased activity tolerance, Impaired flexibility, Impaired UE functional use, Hypomobility, Decreased strength, Decreased range of motion, Decreased endurance, Decreased coordination, Decreased scar mobility, Increased muscle spasms, Postural dysfunction, Pain, Improper body mechanics  Visit Diagnosis: Stiffness of right shoulder, not elsewhere classified  Acute pain of right shoulder  Muscle weakness (generalized)  Abnormal posture     Problem List Patient Active Problem List   Diagnosis Date Noted  . S/P arthroscopy of right shoulder 08/13/2017  . Genetic testing 05/29/2017  . Family history of breast cancer   . Hyperlipidemia LDL goal <70 12/30/2015  .  Cerebrovascular disease or lesion 12/30/2015  . Mild pulmonary hypertension ([place]) 12/30/2015  . TIA (transient  ischemic attack) 12/28/2015  . GERD (gastroesophageal reflux disease) 07/23/2015  . Chronic pain 07/23/2015  . Obese 07/19/2015  . S/P left THA, AA 07/17/2015  . 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 Insomnia 03/03/2011  . OSA (obstructive sleep apnea) 03/03/2011  . Depression, major, recurrent ([place]) 12/02/2010  . CONSTIPATION, SLOW TRANSIT 10/04/2010  . IRRITABLE BOWEL SYNDROME 08/14/2009  . CYSTITIS, CHRONIC INTERSTITIAL 06/27/2009  . UNSPECIFIED HYPOTHYROIDISM 05/16/2009  . LIPOMA OF OTHER SPECIFIED SITES 04/13/2009  . BRUXISM 04/13/2009  . SYNCOPE 03/13/2009  . Abnormal finding on MRI of brain 02/07/2009  . MUSCLE WEAKNESS (GENERALIZED) 12/20/2008  . UNSPECIFIED ALLERGIC ALVEOLITIS AND PNEUMONITIS 08/02/2008  . HIP PAIN, LEFT, CHRONIC 07/04/2008  . MALAISE AND FATIGUE 07/04/2008  . LOW BACK PAIN 05/26/2008  . PROTEINURIA 12/23/2007  . HYPERCALCEMIA 11/18/2007  . Essential hypertension 09/14/2007  . ANEMIA, B12 DEFICIENCY 05/18/2007  . 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 ALLERGIC RHINITIS 03/29/2007  . FIBROMYALGIA 03/29/2007    10:50 AM,09/25/17 Elly Modena PT, DPT [facility] [address] [place], [place], [address] Phone: [phone]   Fax:  [phone]  Name: Kristin Pope MRN: [id number] Date of Birth: [birth year]

## 2017-10-13 ENCOUNTER — Ambulatory Visit: Payer: Medicaid Other | Attending: Specialist | Admitting: Physical Therapy

## 2017-10-13 ENCOUNTER — Encounter: Payer: Self-pay | Admitting: Physical Therapy

## 2017-10-13 DIAGNOSIS — M6281 Muscle weakness (generalized): Secondary | ICD-10-CM | POA: Diagnosis present

## 2017-10-13 DIAGNOSIS — M25511 Pain in right shoulder: Secondary | ICD-10-CM | POA: Diagnosis present

## 2017-10-13 DIAGNOSIS — M25611 Stiffness of right shoulder, not elsewhere classified: Secondary | ICD-10-CM | POA: Diagnosis not present

## 2017-10-13 DIAGNOSIS — R293 Abnormal posture: Secondary | ICD-10-CM | POA: Diagnosis present

## 2017-10-13 NOTE — Therapy (Signed)
[facility] [address] [place], [place], [address] Phone: [phone]   Fax:  [phone]  Physical Therapy Treatment  Patient Details  Name: Kristin Pope MRN: [id number] Date of Birth: 1956-03-29 Referring Provider: Hart Robinsons, MD    Encounter Date: 10/13/2017  PT End of Session - 10/13/17 1442    Visit Number  8 including eval    Date for PT Re-Evaluation  09/30/17    Authorization Type  09/28/17 to 11/09/17 (pending medicaid approval)    Authorization Time Period  Medicaid approval:     Authorization - Visit Number  1    Authorization - Number of Visits  3    PT Start Time  0867    PT Stop Time  1529    PT Time Calculation (min)  44 min    Activity Tolerance  Patient tolerated treatment well;No increased pain    Behavior During Therapy  WFL for tasks assessed/performed       Past Medical History:  Diagnosis Date  . Allergic rhinitis   . Anemia, pernicious    b12 def.  . Anxiety   . Arthritis   . Biceps tendon tear    right  . Cervical spondylosis with radiculopathy    C2 -- C7  . Chronic fatigue   . Chronic pain    neck, back  . DDD (degenerative disc disease), cervical   . DDD (degenerative disc disease), lumbosacral   . Depression, major, recurrent ([place])   . Diverticulosis of colon   . Eczema   . Family history of adverse reaction to anesthesia    sister has problems waking up  . Family history of breast cancer   . Fibromyalgia 03/29/2007  . GERD (gastroesophageal reflux disease)   . Headache    constant headaches  . History of colonic diverticulitis 08/16/2010   w/ perforation (lower GI bleed)--- resolved without surgerical intervention  . 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 Hx of pyelonephritis 09/2005   due to UTI  . Hypertension   . IBS (irritable bowel syndrome)    internates between constpation/  diarrhea  . Lumbosacral spondylosis    L2-3, L4-5  . Mild obstructive sleep apnea    per study 06/ 2009 mild osa  AHI 13/hr---  recommendation given mouth appliance, loss wt., cpap  . Mixed hyperlipidemia   . OA (osteoarthritis)    right shoulder AC joint  . Pre-diabetes   . Right rotator cuff tear     Past Surgical History:  Procedure Laterality Date  . BREAST SURGERY     breast biopsy-benign  . COLONOSCOPY  last one 08-08-2009  . DILATATION & CURRETTAGE/HYSTEROSCOPY WITH RESECTOCOPE  02-03-2011   dr Dellis Filbert  [facility]   polypectomy  . RADIAL OPTIC NEUROTOMY     twice in lumbar area of back-every 6 months  . SHOULDER ARTHROSCOPY WITH ROTATOR CUFF REPAIR Right 08/13/2017   Procedure: RIGHT SHOULDER ARTHROSCOPY, DEBRIDEMENT, BICEPS TENOTOMY, ROTATOR CUFF REPAIR, DISTAL CLAVICLE RESECTION;  Surgeon: Sydnee Cabal, MD;  Location: [facility];  Service: Orthopedics;  Laterality: Right;  . TOTAL HIP ARTHROPLASTY Left 07/17/2015   Procedure: LEFT TOTAL HIP ARTHROPLASTY ANTERIOR APPROACH;  Surgeon: Paralee Cancel, MD;  [facility];  Service: Orthopedics;  Laterality: Left;  . TOTAL HIP ARTHROPLASTY Right 08/21/2015   Procedure: RIGHT TOTAL HIP ARTHROPLASTY ANTERIOR APPROACH;  Surgeon: Paralee Cancel, MD;  [facility];  Service: Orthopedics;  Laterality: Right;  . TRANSTHORACIC ECHOCARDIOGRAM  12/30/2015   ef 57-26%, grade 1 diastolic dysfunction/  mild MR/ trivial TR    There were no vitals filed for this visit.  Subjective Assessment - 10/13/17 1447    Subjective  Pt reports that things are going ok. She is not sure how often she is completing her HEP, but she is "still getting the movements in". She has pain in her bicep sometimes.     Pertinent History  MD in Feb.      Currently in Pain?  No/denies    Pain Onset  1 to 4 weeks ago                      [facility] Adult PT Treatment/Exercise - 10/13/17 0001      Shoulder Exercises: Supine   Other Supine  Exercises  serratus punches x20 reps       Shoulder Exercises: Seated   External Rotation  Both;15 reps;AROM;Other (comment) towel squeeze     Flexion  AAROM;Both;10 reps    Flexion Limitations  therapist instruction to complete pain free    Other Seated Exercises  rows with red TB    Other Seated Exercises  seated Rt bicep curls 2x10 reps, 3# dumbbell       Shoulder Exercises: Pulleys   Flexion  3 minutes RUE    ABduction  3 minutes RUE    Other Pulley Exercises  Rt IR x3 min      Shoulder Exercises: ROM/Strengthening   Rhythmic Stabilization, Supine  5x10 sec, RUE only, perturbations along elbow     Other ROM/Strengthening Exercises  Rt UE ranger L25 into flexion x25 reps, into abduction x20 reps       Shoulder Exercises: Stretch   Internal Rotation Stretch  Other (comment) 2x10 sec hold, RUE only     External Rotation Stretch  --             PT Education - 10/13/17 1449    Education provided  Yes    Education Details  importance of completing HEP daily    Person(s) Educated  Patient    Methods  Explanation    Comprehension  Verbalized understanding       PT Short Term Goals - 10/13/17 1535      PT SHORT TERM GOAL #1   Title  Pt will demo consistency and independence with her HEP to decrease inflammation and improve shoulder ROM.    Time  4    Period  Weeks    Status  Partially Met      PT SHORT TERM GOAL #2   Title  Pt will demo full Rt shoulder PROM into flexion and abduction to 160 deg, Rt shoulder IR atleast 60 deg to allow for optimal strengthening progression.     Time  4    Period  Weeks    Status  On-going      PT SHORT TERM GOAL #3   Title  Pt will demo pain AAROM in an upright postion with little to no pain and atleast 160 deg of shoulder elevation.    Time  4    Period  Weeks    Status  On-going      PT SHORT TERM GOAL #4   Title  Pts Rt grip strength will be atleast 5lb stronger than on the Lt, to allow for her to complete more fine motor  activities in  the kitchen with minimal difficulty.     Time  4    Period  Weeks    Status  On-going        PT Long Term Goals - 09/25/17 0941      PT LONG TERM GOAL #1   Title  Pt will be independent in advanced HEP to allow for continuous progression towards her goals.     Time  8    Period  Weeks    Status  On-going    Target Date  11/09/17      PT LONG TERM GOAL #2   Title  Pt will demonstrate atleast a 9 point improvement in her UEFI to reflect and improvement in her UE functional use.     Baseline  14/80    Time  8    Period  Weeks    Status  On-going      PT LONG TERM GOAL #3   Title  Pt will demo atleast 120 deg of active Rt shoulder flexion to allow her to reach over head into her cabinet without difficulty.     Baseline  unable     Time  8    Status  Achieved      PT LONG TERM GOAL #4   Title  Pt will report atleast 50% decrease in her pain and stiffness from the start of PT to allow for improved quality of life and daily activity tolerance.     Baseline  30%    Time  8    Period  Weeks    Status  Revised      PT LONG TERM GOAL #5   Title  Pt will demo improved shoulder strength and endurance evident by her report of being able to successfully wash her hair without difficulty.     Time  8    Period  Weeks    Status  New      PT LONG TERM GOAL #6   Title  --    Time  --    Period  --    Status  --            Plan - 10/13/17 1530    Clinical Impression Statement  Pt arrives without report of any Rt shoulder pain. Focused on active assisted ROM of the Rt shoulder in addition to rhythmic stabilization and scapular strengthening. Pt was unable to recall how consistent she has been completing her HEP, and therapist encouraged her to increase adherence with limited number of visits. Pt does demonstrate Rt shoulder shrug during shoulder elevation, improved some with feedback from therapist. HEP was updated and pt demonstrated understanding.     Rehab  Potential  Fair    Clinical Impairments Affecting Rehab Potential  (-) limited visits due to insurance     PT Frequency  3x / week    PT Duration  6 weeks    PT Treatment/Interventions  ADLs/Self Care Home Management;Cryotherapy;Electrical Stimulation;Moist Heat;Iontophoresis 13m/ml Dexamethasone;Therapeutic activities;Therapeutic exercise;Patient/family education;Orthotic Fit/Training;Neuromuscular re-education;Manual techniques;Taping;Dry needling;Passive range of motion;Scar mobilization    PT Next Visit Plan  progression of AAROM incline position; progress to AROM when pain free AAROM; scapular strengthening; gradual tricep/bicep strengthening; manual/modalities as needed for pain control    PT Home Exercise Plan  seated shoulder flexion AAROM; shoulder ER active; shoulder IR stretch with towel; bicep curls     Consulted and Agree with Plan of Care  Patient       Patient will benefit  from skilled therapeutic intervention in order to improve the following deficits and impairments:  Decreased activity tolerance, Impaired flexibility, Impaired UE functional use, Hypomobility, Decreased strength, Decreased range of motion, Decreased endurance, Decreased coordination, Decreased scar mobility, Increased muscle spasms, Postural dysfunction, Pain, Improper body mechanics  Visit Diagnosis: Stiffness of right shoulder, not elsewhere classified  Muscle weakness (generalized)  Acute pain of right shoulder  Abnormal posture     Problem List Patient Active Problem List   Diagnosis Date Noted  . S/P arthroscopy of right shoulder 08/13/2017  . Genetic testing 05/29/2017  . Family history of breast cancer   . Hyperlipidemia LDL goal <70 12/30/2015  . Cerebrovascular disease or lesion 12/30/2015  . Mild pulmonary hypertension ([place]) 12/30/2015  . TIA (transient ischemic attack) 12/28/2015  . GERD (gastroesophageal reflux disease) 07/23/2015  . Chronic pain 07/23/2015  . Obese 07/19/2015  . S/P  left THA, AA 07/17/2015  . 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 Insomnia 03/03/2011  . OSA (obstructive sleep apnea) 03/03/2011  . Depression, major, recurrent ([place]) 12/02/2010  . CONSTIPATION, SLOW TRANSIT 10/04/2010  . IRRITABLE BOWEL SYNDROME 08/14/2009  . CYSTITIS, CHRONIC INTERSTITIAL 06/27/2009  . UNSPECIFIED HYPOTHYROIDISM 05/16/2009  . LIPOMA OF OTHER SPECIFIED SITES 04/13/2009  . BRUXISM 04/13/2009  . SYNCOPE 03/13/2009  . Abnormal finding on MRI of brain 02/07/2009  . MUSCLE WEAKNESS (GENERALIZED) 12/20/2008  . UNSPECIFIED ALLERGIC ALVEOLITIS AND PNEUMONITIS 08/02/2008  . HIP PAIN, LEFT, CHRONIC 07/04/2008  . MALAISE AND FATIGUE 07/04/2008  . LOW BACK PAIN 05/26/2008  . PROTEINURIA 12/23/2007  . HYPERCALCEMIA 11/18/2007  . Essential hypertension 09/14/2007  . ANEMIA, B12 DEFICIENCY 05/18/2007  . 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 ALLERGIC RHINITIS 03/29/2007  . FIBROMYALGIA 03/29/2007   3:38 PM,10/13/17 Sherol Dade PT, DPT [facility] [address] [place], [place], [address] Phone: [phone]   Fax:  [phone]  Name: Kristin Pope MRN: [id number] Date of Birth: 1956/01/20

## 2017-10-13 NOTE — Patient Instructions (Signed)
   Towel Internal Rotation Stretch  Hold a towel in both hands. Bring one hand behind your body and with the other, reach behind your neck and use the towel to gently pull the other hand behind the back until you feel a stretch in the shoulder.   Hold 10 sec, repeat 10x. Make sure elbow and shoulder don't wing.      BICEP CURLS  With your arm at your side, draw up your hand by bending at the elbow.   Keep your palm face up the entire time. x10 reps, holding a can of food.       bilateral external rotation- pizza carry  With back against the wall bend both elbows to 90 degrees, palms facing the ceiling.  Slowly bring the hands away from you, keeping elbows in contact with the body.  As you do this, squeeze the shoulder blades and avoid "hiking" up your shoulders.  Return to starting position and repeat the given amount of reps.        Cane Active Shoulder Flexion in supine  Grasp the cane about shoulder width. Bring it up over your head, trying to stretch the affected shoulder, holding for a 2 count and then bring the cane back to the resting position.  Try sitting up, but if this increases pain try to lean back. Work up to [employer].    [facility] [address] [place], [place] [address] Phone # [phone] Fax [phone]

## 2017-10-19 ENCOUNTER — Ambulatory Visit: Payer: Medicaid Other | Admitting: Physical Therapy

## 2017-10-19 ENCOUNTER — Encounter: Payer: Self-pay | Admitting: Physical Therapy

## 2017-10-19 DIAGNOSIS — R293 Abnormal posture: Secondary | ICD-10-CM

## 2017-10-19 DIAGNOSIS — M25611 Stiffness of right shoulder, not elsewhere classified: Secondary | ICD-10-CM

## 2017-10-19 DIAGNOSIS — M25511 Pain in right shoulder: Secondary | ICD-10-CM

## 2017-10-19 DIAGNOSIS — M6281 Muscle weakness (generalized): Secondary | ICD-10-CM

## 2017-10-19 NOTE — Therapy (Signed)
[facility] [address] [place], [place], [address] Phone: [phone]   Fax:  [phone]  Physical Therapy Treatment  Patient Details  Name: Kristin Pope MRN: [id number] Date of Birth: 1955/10/16 Referring Provider: Hart Robinsons, MD    Encounter Date: 10/19/2017  PT End of Session - 10/19/17 1435    Visit Number  9 including eval    Date for PT Re-Evaluation  09/30/17    Authorization Type  09/28/17 to 11/09/17 (pending medicaid approval)    Authorization Time Period  Medicaid approval:     Authorization - Visit Number  2    Authorization - Number of Visits  3    PT Start Time  7517    PT Stop Time  0017    PT Time Calculation (min)  40 min    Activity Tolerance  Patient tolerated treatment well;No increased pain    Behavior During Therapy  WFL for tasks assessed/performed       Past Medical History:  Diagnosis Date  . Allergic rhinitis   . Anemia, pernicious    b12 def.  . Anxiety   . Arthritis   . Biceps tendon tear    right  . Cervical spondylosis with radiculopathy    C2 -- C7  . Chronic fatigue   . Chronic pain    neck, back  . DDD (degenerative disc disease), cervical   . DDD (degenerative disc disease), lumbosacral   . Depression, major, recurrent ([place])   . Diverticulosis of colon   . Eczema   . Family history of adverse reaction to anesthesia    sister has problems waking up  . Family history of breast cancer   . Fibromyalgia 03/29/2007  . GERD (gastroesophageal reflux disease)   . Headache    constant headaches  . History of colonic diverticulitis 08/16/2010   w/ perforation (lower GI bleed)--- resolved without surgerical intervention  . 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 Hx of pyelonephritis 09/2005   due to UTI  . Hypertension   . IBS (irritable bowel syndrome)    internates between constpation/  diarrhea  . Lumbosacral spondylosis    L2-3, L4-5  . Mild obstructive sleep apnea    per study 06/ 2009 mild osa  AHI 13/hr---  recommendation given mouth appliance, loss wt., cpap  . Mixed hyperlipidemia   . OA (osteoarthritis)    right shoulder AC joint  . Pre-diabetes   . Right rotator cuff tear     Past Surgical History:  Procedure Laterality Date  . BREAST SURGERY     breast biopsy-benign  . COLONOSCOPY  last one 08-08-2009  . DILATATION & CURRETTAGE/HYSTEROSCOPY WITH RESECTOCOPE  02-03-2011   dr Dellis Filbert  [facility]   polypectomy  . RADIAL OPTIC NEUROTOMY     twice in lumbar area of back-every 6 months  . SHOULDER ARTHROSCOPY WITH ROTATOR CUFF REPAIR Right 08/13/2017   Procedure: RIGHT SHOULDER ARTHROSCOPY, DEBRIDEMENT, BICEPS TENOTOMY, ROTATOR CUFF REPAIR, DISTAL CLAVICLE RESECTION;  Surgeon: Sydnee Cabal, MD;  Location: [facility];  Service: Orthopedics;  Laterality: Right;  . TOTAL HIP ARTHROPLASTY Left 07/17/2015   Procedure: LEFT TOTAL HIP ARTHROPLASTY ANTERIOR APPROACH;  Surgeon: Paralee Cancel, MD;  Location: [facility];  Service: Orthopedics;  Laterality: Left;  . TOTAL HIP ARTHROPLASTY Right 08/21/2015   Procedure: RIGHT TOTAL HIP ARTHROPLASTY ANTERIOR APPROACH;  Surgeon: Paralee Cancel, MD;  Location:  [facility];  Service: Orthopedics;  Laterality: Right;  . TRANSTHORACIC ECHOCARDIOGRAM  12/30/2015   ef 19-50%, grade 1 diastolic dysfunction/  mild MR/ trivial TR    There were no vitals filed for this visit.  Subjective Assessment - 10/19/17 1408    Subjective  Pt reports ther her exercises aren't too bad. She was a little sore after her last session, but this only lasted the day afterwards.     Pertinent History  MD in Feb.      Currently in Pain?  No/denies    Pain Onset  1 to 4 weeks ago         [facility] PT Assessment - 10/19/17 0001      Observation/Other Assessments   Upper Extremity Functional Index   28/80                  [facility] Adult PT  Treatment/Exercise - 10/19/17 0001      Shoulder Exercises: Supine   Flexion  AAROM;Right;20 reps    ABduction  AAROM;20 reps;Other (comment);Both incline on wedge      Shoulder Exercises: Standing   Row  Both;15 reps;Theraband;Strengthening;Limitations    Theraband Level (Shoulder Row)  Level 3 (Green)    Row Limitations  x2 sets     Other Standing Exercises  tricep extension with red TB x2x10 reps (heavy cuing for technique); closed chain shoulder flexion/protraction press into pool noodle x20 reps     Other Standing Exercises  UE Ws without resistance and back against wall for improved technique x20 reps       Shoulder Exercises: Pulleys   Other Pulley Exercises  Rt IR x3 min, x5 sec hold       Manual Therapy   Soft tissue mobilization  STM Rt deltoid/bicep/posterior shoulder girdle              PT Education - 10/19/17 1442    Education provided  Yes    Education Details  encouraged continued HEP adherence; technique with therex     Person(s) Educated  Patient    Methods  Explanation;Verbal cues;Tactile cues    Comprehension  Verbalized understanding;Tactile cues required;Verbal cues required       PT Short Term Goals - 10/13/17 1535      PT SHORT TERM GOAL #1   Title  Pt will demo consistency and independence with her HEP to decrease inflammation and improve shoulder ROM.    Time  4    Period  Weeks    Status  Partially Met      PT SHORT TERM GOAL #2   Title  Pt will demo full Rt shoulder PROM into flexion and abduction to 160 deg, Rt shoulder IR atleast 60 deg to allow for optimal strengthening progression.     Time  4    Period  Weeks    Status  On-going      PT SHORT TERM GOAL #3   Title  Pt will demo pain AAROM in an upright postion with little to no pain and atleast 160 deg of shoulder elevation.    Time  4    Period  Weeks    Status  On-going      PT SHORT TERM GOAL #4   Title  Pts Rt grip strength will be atleast 5lb stronger than on the Lt, to  allow for her to complete more fine motor activities in the kitchen with minimal difficulty.     Time  4  Period  Weeks    Status  On-going        PT Long Term Goals - 09/25/17 0941      PT LONG TERM GOAL #1   Title  Pt will be independent in advanced HEP to allow for continuous progression towards her goals.     Time  8    Period  Weeks    Status  On-going    Target Date  11/09/17      PT LONG TERM GOAL #2   Title  Pt will demonstrate atleast a 9 point improvement in her UEFI to reflect and improvement in her UE functional use.     Baseline  14/80    Time  8    Period  Weeks    Status  On-going      PT LONG TERM GOAL #3   Title  Pt will demo atleast 120 deg of active Rt shoulder flexion to allow her to reach over head into her cabinet without difficulty.     Baseline  unable     Time  8    Status  Achieved      PT LONG TERM GOAL #4   Title  Pt will report atleast 50% decrease in her pain and stiffness from the start of PT to allow for improved quality of life and daily activity tolerance.     Baseline  30%    Time  8    Period  Weeks    Status  Revised      PT LONG TERM GOAL #5   Title  Pt will demo improved shoulder strength and endurance evident by her report of being able to successfully wash her hair without difficulty.     Time  8    Period  Weeks    Status  New      PT LONG TERM GOAL #6   Title  --    Time  --    Period  --    Status  --            Plan - 10/19/17 1443    Clinical Impression Statement  Pt is demonstrating improved Rt shoulder ROM and strength. She was able to complete increased reps on UE ranger this session before fatigue was noted. Completed scapular strengthening and gradual progressions of shoulder AROM, however pt continues to report some sort of discomfort in the Rt shoulder when completing elevation activity, however pt was unable to describe her pain compared to muscle fatigue and shaking that she was also experiencing. LEFS  has improved overall to 28/80 points which is a 14 point improvement.     Rehab Potential  Fair    Clinical Impairments Affecting Rehab Potential  (-) limited visits due to insurance     PT Frequency  3x / week    PT Duration  6 weeks    PT Treatment/Interventions  ADLs/Self Care Home Management;Cryotherapy;Electrical Stimulation;Moist Heat;Iontophoresis 38m/ml Dexamethasone;Therapeutic activities;Therapeutic exercise;Patient/family education;Orthotic Fit/Training;Neuromuscular re-education;Manual techniques;Taping;Dry needling;Passive range of motion;Scar mobilization    PT Next Visit Plan  re-evaluation; update HEP: bicep strengthening, scap strengthening, shoulder AROM in supine    PT Home Exercise Plan  seated shoulder flexion AAROM; shoulder ER active; shoulder IR stretch with towel; bicep curls     Consulted and Agree with Plan of Care  Patient       Patient will benefit from skilled therapeutic intervention in order to improve the following deficits and impairments:  Decreased activity tolerance,  Impaired flexibility, Impaired UE functional use, Hypomobility, Decreased strength, Decreased range of motion, Decreased endurance, Decreased coordination, Decreased scar mobility, Increased muscle spasms, Postural dysfunction, Pain, Improper body mechanics  Visit Diagnosis: Stiffness of right shoulder, not elsewhere classified  Muscle weakness (generalized)  Acute pain of right shoulder  Abnormal posture     Problem List Patient Active Problem List   Diagnosis Date Noted  . S/P arthroscopy of right shoulder 08/13/2017  . Genetic testing 05/29/2017  . Family history of breast cancer   . Hyperlipidemia LDL goal <70 12/30/2015  . Cerebrovascular disease or lesion 12/30/2015  . Mild pulmonary hypertension ([place]) 12/30/2015  . TIA (transient ischemic attack) 12/28/2015  . GERD (gastroesophageal reflux disease) 07/23/2015  . Chronic pain 07/23/2015  . Obese 07/19/2015  . S/P left THA,  AA 07/17/2015  . 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 Insomnia 03/03/2011  . OSA (obstructive sleep apnea) 03/03/2011  . Depression, major, recurrent ([place]) 12/02/2010  . CONSTIPATION, SLOW TRANSIT 10/04/2010  . IRRITABLE BOWEL SYNDROME 08/14/2009  . CYSTITIS, CHRONIC INTERSTITIAL 06/27/2009  . UNSPECIFIED HYPOTHYROIDISM 05/16/2009  . LIPOMA OF OTHER SPECIFIED SITES 04/13/2009  . BRUXISM 04/13/2009  . SYNCOPE 03/13/2009  . Abnormal finding on MRI of brain 02/07/2009  . MUSCLE WEAKNESS (GENERALIZED) 12/20/2008  . UNSPECIFIED ALLERGIC ALVEOLITIS AND PNEUMONITIS 08/02/2008  . HIP PAIN, LEFT, CHRONIC 07/04/2008  . MALAISE AND FATIGUE 07/04/2008  . LOW BACK PAIN 05/26/2008  . PROTEINURIA 12/23/2007  . HYPERCALCEMIA 11/18/2007  . Essential hypertension 09/14/2007  . ANEMIA, B12 DEFICIENCY 05/18/2007  . 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 ALLERGIC RHINITIS 03/29/2007  . FIBROMYALGIA 03/29/2007   2:55 PM,10/19/17 Sherol Dade PT, DPT [facility] [address] [place], [place], [address] Phone: [phone]   Fax:  [phone]  Name: CHARLIEE KRENZ MRN: [id number] Date of Birth: 1956/02/21

## 2017-10-21 ENCOUNTER — Ambulatory Visit: Payer: Medicaid Other | Admitting: Physical Therapy

## 2017-10-21 DIAGNOSIS — M25611 Stiffness of right shoulder, not elsewhere classified: Secondary | ICD-10-CM | POA: Diagnosis not present

## 2017-10-21 DIAGNOSIS — R293 Abnormal posture: Secondary | ICD-10-CM

## 2017-10-21 DIAGNOSIS — M6281 Muscle weakness (generalized): Secondary | ICD-10-CM

## 2017-10-21 DIAGNOSIS — M25511 Pain in right shoulder: Secondary | ICD-10-CM

## 2017-10-21 NOTE — Patient Instructions (Signed)
  ELASTIC BAND ROWS   Holding elastic band with both hands, draw back the band as you bend your elbows. Keep your elbows near the side of your body. x15 reps      Supine shoulder flexion  Lie on your back and reach for the involved wrist using the opposite hand.  Using your non-involved hand, bring the involved arm up as far as comfortable.  Return to start position.  Try to relax as much as possible on the involved arm.       Shoulder Abduction   While lying on your side and arm at your side, slowly raise up the arm towards overhead and away from your side. x20-30 reps    SIDELYING EXTERNAL ROTATION WITH TOWEL - ER  Lie on your side with your elbow bent to 90 degrees. Place a rolled up towel between your arm and the side your body as shown.   Squeeze your shoulder blade back and down toward your buttocks and hold that position.   Next, roll your arm upwards from your stomach area towards the ceiling while maintaining your arm against the towel and with your shoulder blade held down and back the entire time. Lower your arm and repeat.   [facility] reps    [facility] [address] [place], [place] [address] Phone # [phone] Fax [phone]

## 2017-10-22 NOTE — Therapy (Signed)
[facility] [address] [place], [place], [address] Phone: [phone]   Fax:  [phone]  Physical Therapy Treatment/Reassessment  Patient Details  Name: Kristin Pope MRN: [id number] Date of Birth: 28-Sep-1956 Referring Provider: Hart Robinsons, MD    Encounter Date: 10/21/2017  PT End of Session - 10/21/17 1623    Visit Number  10 including eval    Date for PT Re-Evaluation  02/02/18    Authorization Type  11/10/17 to 02/02/18 (medicaid approval pending)    Authorization Time Period  Medicaid approval:     Authorization - Visit Number  --    Authorization - Number of Visits  --    PT Start Time  4680    PT Stop Time  1628    PT Time Calculation (min)  55 min    Activity Tolerance  Patient tolerated treatment well;No increased pain    Behavior During Therapy  WFL for tasks assessed/performed       Past Medical History:  Diagnosis Date  . Allergic rhinitis   . Anemia, pernicious    b12 def.  . Anxiety   . Arthritis   . Biceps tendon tear    right  . Cervical spondylosis with radiculopathy    C2 -- C7  . Chronic fatigue   . Chronic pain    neck, back  . DDD (degenerative disc disease), cervical   . DDD (degenerative disc disease), lumbosacral   . Depression, major, recurrent ([place])   . Diverticulosis of colon   . Eczema   . Family history of adverse reaction to anesthesia    sister has problems waking up  . Family history of breast cancer   . Fibromyalgia 03/29/2007  . GERD (gastroesophageal reflux disease)   . Headache    constant headaches  . History of colonic diverticulitis 08/16/2010   w/ perforation (lower GI bleed)--- resolved without surgerical intervention  . 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 Hx of pyelonephritis 09/2005   due to UTI  . Hypertension   . IBS (irritable bowel syndrome)    internates  between constpation/ diarrhea  . Lumbosacral spondylosis    L2-3, L4-5  . Mild obstructive sleep apnea    per study 06/ 2009 mild osa  AHI 13/hr---  recommendation given mouth appliance, loss wt., cpap  . Mixed hyperlipidemia   . OA (osteoarthritis)    right shoulder AC joint  . Pre-diabetes   . Right rotator cuff tear     Past Surgical History:  Procedure Laterality Date  . BREAST SURGERY     breast biopsy-benign  . COLONOSCOPY  last one 08-08-2009  . DILATATION & CURRETTAGE/HYSTEROSCOPY WITH RESECTOCOPE  02-03-2011   dr Dellis Filbert  [facility]   polypectomy  . RADIAL OPTIC NEUROTOMY     twice in lumbar area of back-every 6 months  . SHOULDER ARTHROSCOPY WITH ROTATOR CUFF REPAIR Right 08/13/2017   Procedure: RIGHT SHOULDER ARTHROSCOPY, DEBRIDEMENT, BICEPS TENOTOMY, ROTATOR CUFF REPAIR, DISTAL CLAVICLE RESECTION;  Surgeon: Sydnee Cabal, MD;  Location: [place];  Service: Orthopedics;  Laterality: Right;  . TOTAL HIP ARTHROPLASTY Left 07/17/2015   Procedure: LEFT TOTAL HIP ARTHROPLASTY ANTERIOR APPROACH;  Surgeon: Paralee Cancel, MD;  Location: [facility];  Service: Orthopedics;  Laterality: Left;  . TOTAL HIP ARTHROPLASTY Right 08/21/2015   Procedure: RIGHT TOTAL HIP ARTHROPLASTY ANTERIOR APPROACH;  Surgeon: Paralee Cancel, MD;  Location:  [facility];  Service: Orthopedics;  Laterality: Right;  . TRANSTHORACIC ECHOCARDIOGRAM  12/30/2015   ef 77-03%, grade 1 diastolic dysfunction/  mild MR/ trivial TR    There were no vitals filed for this visit.  Subjective Assessment - 10/21/17 1535    Subjective  Pt reports that the shoulder blade area is a little sore today, and her neck is a little stiff. Otherwise, things are going well and she is trying to work on her exercises more.     Pertinent History  MD in Feb.      Currently in Pain?  Yes pain free end of session    Pain Score  6     Pain Location  Neck    Pain Orientation  Right    Pain Descriptors / Indicators  Aching    Pain Type   Acute pain    Pain Radiating Towards  none     Pain Onset  1 to 4 weeks ago    Pain Frequency  Intermittent    Aggravating Factors   not sure if she slept wrong     Pain Relieving Factors  nothing, just noticed it today.          [facility] PT Assessment - 10/22/17 0001      Assessment   Medical Diagnosis  Rt shoulder scope/RTC repair/biceps tenotomy    Referring Provider  Hart Robinsons, MD     Onset Date/Surgical Date  08/13/17    Hand Dominance  Right    Next MD Visit  11/18/17 approx    Prior Therapy  prior to surgery      Precautions   Precautions  Shoulder    Type of Shoulder Precautions  Rotator cuff repair     Precaution Comments  Sling discontinued      Restrictions   Weight Bearing Restrictions  No      Balance Screen   Has the patient fallen in the past 6 months  No    Has the patient had a decrease in activity level because of a fear of falling?   No    Is the patient reluctant to leave their home because of a fear of falling?   No      Home Film/video [employer] residence      Prior Function   Level of Independence  Independent      Observation/Other Assessments   Upper Extremity Functional Index   28/80      Posture/Postural Control   Posture Comments  no longer wearing sling, forward shoulders       AROM   Right Shoulder Extension  45 Degrees    Right Shoulder Flexion  130 Degrees pain free, (+) shoulder shrug    Right Shoulder ABduction  110 Degrees pain top of shoulder (+) shrug    Right Shoulder Internal Rotation  -- reach behind back to bra strap only     Right Shoulder External Rotation  -- reach behind head to C7    Left Shoulder Extension  50 Degrees    Left Shoulder Flexion  160 Degrees    Left Shoulder ABduction  160 Degrees    Left Shoulder Internal Rotation  -- reach behind back to T2    Left Shoulder External Rotation  -- reach behind head to T2      PROM   Overall PROM Comments  Rt shoulder flexion/abdcution/ER/IR  full and pain free      Strength  Overall Strength Comments  Rt 36 lbs; Lt 39 lbs                   [facility] Adult PT Treatment/Exercise - 10/22/17 0001      Shoulder Exercises: Supine   Flexion  Right;15 reps;Limitations    Flexion Limitations  supine (active assist returning back to start)      Shoulder Exercises: Sidelying   External Rotation  Right;10 reps;Strengthening;Limitations    External Rotation Limitations  towel under arm     ABduction  AROM;Strengthening;Right;15 reps;Limitations    ABduction Limitations  elbow bent during return to start      Shoulder Exercises: Stretch   Internal Rotation Stretch  Limitations    Internal Rotation Stretch Limitations  Rt shoulder IR stretch with towel x10 reps, 5 sec hold              PT Education - 10/21/17 1621    Education provided  Yes    Education Details  progress towards goals; updated HEP and discussed current plan to progress AROM and strength/endurance moving forward.    Person(s) Educated  Patient    Methods  Explanation;Handout    Comprehension  Verbalized understanding;Returned demonstration       PT Short Term Goals - 10/21/17 1551      PT SHORT TERM GOAL #1   Title  Pt will demo consistency and independence with her HEP to decrease inflammation and improve shoulder ROM.    Time  4    Period  Weeks    Status  Partially Met      PT SHORT TERM GOAL #2   Title  Pt will demo full Rt shoulder PROM into flexion and abduction to 160 deg, Rt shoulder IR atleast 60 deg to allow for optimal strengthening progression.     Time  4    Period  Weeks    Status  Achieved      PT SHORT TERM GOAL #3   Title  Pt will demo pain AAROM in an upright postion with little to no pain and atleast 160 deg of shoulder elevation.    Time  4    Period  Weeks    Status  Achieved      PT SHORT TERM GOAL #4   Title  Pts Rt grip strength will be atleast 5lb stronger than on the Lt, to allow for her to complete more fine  motor activities in the kitchen with minimal difficulty.     Baseline  improved by 6lb on the Rt, still 3 lb weaker than the Lt    Time  4    Period  Weeks    Status  On-going        PT Long Term Goals - 10/22/17 0857      PT LONG TERM GOAL #1   Title  Pt will be independent in advanced HEP to allow for continuous progression towards her goals.     Time  12    Period  Weeks    Status  On-going    Target Date  02/02/18      PT LONG TERM GOAL #2   Title  Pt will demonstrate atleast a 9 point improvement in her UEFI to reflect and improvement in her UE functional use.     Baseline  28/80    Time  12    Period  Weeks    Status  On-going      PT LONG TERM  GOAL #3   Title  Pt will demo atleast 155 deg of active Rt shoulder flexion with minimal shoulder shrug to allow her to reach over head into her cabinet without difficulty.     Baseline  130 deg flexion    Time  12    Status  Partially Met      PT LONG TERM GOAL #4   Title  Pt will report atleast 50% decrease in her pain and stiffness from the start of PT to allow for improved quality of life and daily activity tolerance.     Baseline  30%    Time  12    Period  Weeks    Status  Revised      PT LONG TERM GOAL #5   Title  Pt will demo improved shoulder strength and endurance evident by her ability to maintain RUE elevated above her head for up to 10 min, and her report of being able to successfully wash her hair without difficulty.     Baseline  unable to maintain shoulder elevated above head for more than 2 min or so without fatigue/difficulty    Time  12    Period  Weeks    Status  Not Met      PT LONG TERM GOAL #6   Title  Pt will demo improved RUE strength to atleast 4/5 MMT which will allow her to lift small cans/objects overhead into her cabinet at home.     Time  12    Period  Weeks            Plan - 10/21/17 1627    Clinical Impression Statement  Pt has made steady progress towards her goals, meeting 6 out  of 9 established short and long term goals this approval period. She demonstrates improved Rt shoulder active ROM into flexion 130 deg and abduction 110 deg. Passive ROM is now nearly full and pain free. Pt's grip strength continues to improve, noting 6lb improvement this session, and her UEFI scale score improved 14 points which is a significant improvement as well. She is able to use her arm more at home, however she currently lacks the strength and endurance to fully elevate her arm which is limiting her ability to perform self-care tasks such as washing and combing her hair. She has been more proactive recently about completing her HEP and has been making steady progress since the start of rehab. Due to her remaining limitations in Rt shoulder active ROM, strength, endurance, proprioception and scapula control, she would benefit from continued skilled PT to facilitate her return to full independence with daily activity at home and in the community.     Rehab Potential  Fair    Clinical Impairments Affecting Rehab Potential  (-) limited visits due to insurance     PT Frequency  Other (comment) 1-2x/week    PT Duration  12 weeks    PT Treatment/Interventions  ADLs/Self Care Home Management;Cryotherapy;Electrical Stimulation;Moist Heat;Iontophoresis 49m/ml Dexamethasone;Therapeutic activities;Therapeutic exercise;Patient/family education;Orthotic Fit/Training;Neuromuscular re-education;Manual techniques;Taping;Dry needling;Passive range of motion;Scar mobilization    PT Next Visit Plan  progression of AROM and strength (sidelying/supine strengthening), begin progressive resistance therex once AROM is full, shoulder endurance/proprioception, scapular strengthening    PT Home Exercise Plan  sidelying shoulder Abd AROM, supine shoulder flexion AROM, towel IR stretch, sidelying shoulder ER AROM    Consulted and Agree with Plan of Care  Patient       Patient will benefit from skilled therapeutic intervention  in order to improve the following deficits and impairments:  Decreased activity tolerance, Impaired flexibility, Impaired UE functional use, Hypomobility, Decreased strength, Decreased range of motion, Decreased endurance, Decreased coordination, Decreased scar mobility, Increased muscle spasms, Postural dysfunction, Pain, Improper body mechanics  Visit Diagnosis: Stiffness of right shoulder, not elsewhere classified  Muscle weakness (generalized)  Acute pain of right shoulder  Abnormal posture     Problem List Patient Active Problem List   Diagnosis Date Noted  . S/P arthroscopy of right shoulder 08/13/2017  . Genetic testing 05/29/2017  . Family history of breast cancer   . Hyperlipidemia LDL goal <70 12/30/2015  . Cerebrovascular disease or lesion 12/30/2015  . Mild pulmonary hypertension ([place]) 12/30/2015  . TIA (transient ischemic attack) 12/28/2015  . GERD (gastroesophageal reflux disease) 07/23/2015  . Chronic pain 07/23/2015  . Obese 07/19/2015  . S/P left THA, AA 07/17/2015  . 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 Insomnia 03/03/2011  . OSA (obstructive sleep apnea) 03/03/2011  . Depression, major, recurrent ([place]) 12/02/2010  . CONSTIPATION, SLOW TRANSIT 10/04/2010  . IRRITABLE BOWEL SYNDROME 08/14/2009  . CYSTITIS, CHRONIC INTERSTITIAL 06/27/2009  . UNSPECIFIED HYPOTHYROIDISM 05/16/2009  . LIPOMA OF OTHER SPECIFIED SITES 04/13/2009  . BRUXISM 04/13/2009  . SYNCOPE 03/13/2009  . Abnormal finding on MRI of brain 02/07/2009  . MUSCLE WEAKNESS (GENERALIZED) 12/20/2008  . UNSPECIFIED ALLERGIC ALVEOLITIS AND PNEUMONITIS 08/02/2008  . HIP PAIN, LEFT, CHRONIC 07/04/2008  . MALAISE AND FATIGUE 07/04/2008  . LOW BACK PAIN 05/26/2008  . PROTEINURIA 12/23/2007  . HYPERCALCEMIA 11/18/2007  . Essential hypertension 09/14/2007  . ANEMIA, B12 DEFICIENCY 05/18/2007  . 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 ALLERGIC RHINITIS 03/29/2007  . FIBROMYALGIA  03/29/2007    9:15 AM,10/22/17 Sherol Dade PT, DPT [facility] [address] [place], [place], [address] Phone: [phone]   Fax:  [phone]  Name: TYLEAH LOH MRN: [id number] Date of Birth: May 17, 1956

## 2017-11-04 ENCOUNTER — Ambulatory Visit: Payer: Medicaid Other | Attending: Specialist | Admitting: Physical Therapy

## 2017-11-04 DIAGNOSIS — M25611 Stiffness of right shoulder, not elsewhere classified: Secondary | ICD-10-CM | POA: Insufficient documentation

## 2017-11-04 DIAGNOSIS — M6281 Muscle weakness (generalized): Secondary | ICD-10-CM | POA: Diagnosis present

## 2017-11-04 DIAGNOSIS — M25511 Pain in right shoulder: Secondary | ICD-10-CM | POA: Insufficient documentation

## 2017-11-04 DIAGNOSIS — R293 Abnormal posture: Secondary | ICD-10-CM | POA: Insufficient documentation

## 2017-11-04 NOTE — Patient Instructions (Signed)
      Copyright  VHI. All rights reserved.  Resistive Band Rowing   With resistive band anchored in door, grasp both ends. Keeping elbows bent, pull back, squeezing shoulder blades together. Hold __2__ seconds. Repeat __10__ times. Do __2-3__ sessions per day.  http://gt2.exer.us/97   Copyright  VHI. All rights reserved.             .                       Strengthening: Resisted Extension   Hold tubing in right hand, arm forward. Pull arm back, elbow straight.You can do both arms together if you want.  Repeat _10___ times per set. Do _2___ sets per session. Do _1-2___ sessions per day.  Can place band around the front of your body and perform with both arms at the same time.

## 2017-11-04 NOTE — Therapy (Signed)
[facility] [address], [place], [place], [address] Phone: [phone]   Fax:  [phone]  Physical Therapy Treatment  Patient Details  Name: Kristin Pope MRN: [id number] Date of Birth: 1956/03/21 Referring Provider: Hart Robinsons, MD    Encounter Date: 11/04/2017  PT End of Session - 11/04/17 1603    Visit Number  -- 1/12    Authorization Type  11/10/17 to 02/02/18 (medicaid approval pending)    Authorization Time Period  Medicaid approval:     Authorization - Visit Number  1    Authorization - Number of Visits  12    PT Start Time  3202    PT Stop Time  3343    PT Time Calculation (min)  38 min    Activity Tolerance  Patient tolerated treatment well;No increased pain    Behavior During Therapy  WFL for tasks assessed/performed       Past Medical History:  Diagnosis Date  . Allergic rhinitis   . Anemia, pernicious    b12 def.  . Anxiety   . Arthritis   . Biceps tendon tear    right  . Cervical spondylosis with radiculopathy    C2 -- C7  . Chronic fatigue   . Chronic pain    neck, back  . DDD (degenerative disc disease), cervical   . DDD (degenerative disc disease), lumbosacral   . Depression, major, recurrent ([place])   . Diverticulosis of colon   . Eczema   . Family history of adverse reaction to anesthesia    sister has problems waking up  . Family history of breast cancer   . Fibromyalgia 03/29/2007  . GERD (gastroesophageal reflux disease)   . Headache    constant headaches  . History of colonic diverticulitis 08/16/2010   w/ perforation (lower GI bleed)--- resolved without surgerical intervention  . 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 Hx of pyelonephritis 09/2005   due to UTI  . Hypertension   . IBS (irritable bowel syndrome)    internates between constpation/ diarrhea  . Lumbosacral spondylosis    L2-3,  L4-5  . Mild obstructive sleep apnea    per study 06/ 2009 mild osa  AHI 13/hr---  recommendation given mouth appliance, loss wt., cpap  . Mixed hyperlipidemia   . OA (osteoarthritis)    right shoulder AC joint  . Pre-diabetes   . Right rotator cuff tear     Past Surgical History:  Procedure Laterality Date  . BREAST SURGERY     breast biopsy-benign  . COLONOSCOPY  last one 08-08-2009  . DILATATION & CURRETTAGE/HYSTEROSCOPY WITH RESECTOCOPE  02-03-2011   dr Dellis Filbert  [facility]   polypectomy  . RADIAL OPTIC NEUROTOMY     twice in lumbar area of back-every 6 months  . SHOULDER ARTHROSCOPY WITH ROTATOR CUFF REPAIR Right 08/13/2017   Procedure: RIGHT SHOULDER ARTHROSCOPY, DEBRIDEMENT, BICEPS TENOTOMY, ROTATOR CUFF REPAIR, DISTAL CLAVICLE RESECTION;  Surgeon: Sydnee Cabal, MD;  Location: [facility];  Service: Orthopedics;  Laterality: Right;  . TOTAL HIP ARTHROPLASTY Left 07/17/2015   Procedure: LEFT TOTAL HIP ARTHROPLASTY ANTERIOR APPROACH;  Surgeon: Paralee Cancel, MD;  [facility];  Service: Orthopedics;  Laterality: Left;  . TOTAL HIP ARTHROPLASTY Right 08/21/2015   Procedure: RIGHT TOTAL HIP ARTHROPLASTY ANTERIOR APPROACH;  Surgeon: Paralee Cancel, MD;  Location[facility];  Service: Orthopedics;  Laterality: Right;  .  TRANSTHORACIC ECHOCARDIOGRAM  12/30/2015   ef 51-10%, grade 1 diastolic dysfunction/  mild MR/ trivial TR    There were no vitals filed for this visit.  Subjective Assessment - 11/04/17 1608    Subjective  I think I stretched behind my back wrong, maybe I twisted my arm wrong because it has been hurting more since.    Currently in Pain?  Yes    Pain Score  6     Pain Location  -- forearm and leteral arm    Pain Orientation  Right    Pain Descriptors / Indicators  Sore    Aggravating Factors   Reaching behind her    Pain Relieving Factors  not much    Multiple Pain Sites  No                      [facility] Adult PT Treatment/Exercise -  11/04/17 0001      Shoulder Exercises: Supine   Horizontal ABduction  -- yellow band 6x,     Flexion  -- 1 1/2 # on cane: bil UE pressing cane at chest level 2x5    Other Supine Exercises  1# RTUE elbow flexed 10x Arm at 90 degrees: holding 1# circles 5x each way      Shoulder Exercises: Sidelying   External Rotation  Strengthening;Right;20 reps;Weights    External Rotation Weight (lbs)  1 towel under arm    ABduction  AROM;Strengthening;Right;Weights    ABduction Weight (lbs)  1 2 x6    ABduction Limitations  Less than 25% asst from PTA, mainly to stabilize scapula      Shoulder Exercises: Pulleys   Flexion  3 minutes      Shoulder Exercises: Stretch   Internal Rotation Stretch Limitations  Rt shoulder IR stretch with yellow band x20 reps,              PT Education - 11/04/17 1634    Education provided  Yes    Education Details  yellow band standing rows & extensions    Person(s) Educated  Patient    Methods  Explanation;Demonstration;Tactile cues;Handout;Verbal cues    Comprehension  Returned demonstration;Verbalized understanding       PT Short Term Goals - 11/04/17 1628      PT SHORT TERM GOAL #1   Title  Pt will demo consistency and independence with her HEP to decrease inflammation and improve shoulder ROM.    Time  4    Period  Weeks    Status  Achieved        PT Long Term Goals - 10/22/17 0857      PT LONG TERM GOAL #1   Title  Pt will be independent in advanced HEP to allow for continuous progression towards her goals.     Time  12    Period  Weeks    Status  On-going    Target Date  02/02/18      PT LONG TERM GOAL #2   Title  Pt will demonstrate atleast a 9 point improvement in her UEFI to reflect and improvement in her UE functional use.     Baseline  28/80    Time  12    Period  Weeks    Status  On-going      PT LONG TERM GOAL #3   Title  Pt will demo atleast 155 deg of active Rt shoulder flexion with minimal shoulder shrug to allow her to  reach over  head into her cabinet without difficulty.     Baseline  130 deg flexion    Time  12    Status  Partially Met      PT LONG TERM GOAL #4   Title  Pt will report atleast 50% decrease in her pain and stiffness from the start of PT to allow for improved quality of life and daily activity tolerance.     Baseline  30%    Time  12    Period  Weeks    Status  Revised      PT LONG TERM GOAL #5   Title  Pt will demo improved shoulder strength and endurance evident by her ability to maintain RUE elevated above her head for up to 10 min, and her report of being able to successfully wash her hair without difficulty.     Baseline  unable to maintain shoulder elevated above head for more than 2 min or so without fatigue/difficulty    Time  12    Period  Weeks    Status  Not Met      PT LONG TERM GOAL #6   Title  Pt will demo improved RUE strength to atleast 4/5 MMT which will allow her to lift small cans/objects overhead into her cabinet at home.     Time  12    Period  Weeks            Plan - 11/04/17 1605    Clinical Impression Statement  Pt returns for treatment after being off a week awaiting her insurance approval. Pt has been approved fore 12 more visits. Today we added some light weights to her PREs, which pt tolerated very well, no pain and pretty good technique. She was given a yellow band for standing exercises to advance her HEP. She would like to come 2x next week.     Rehab Potential  Fair    Clinical Impairments Affecting Rehab Potential  (-) limited visits due to insurance     PT Frequency  Other (comment)    PT Duration  12 weeks    PT Treatment/Interventions  ADLs/Self Care Home Management;Cryotherapy;Electrical Stimulation;Moist Heat;Iontophoresis 67m/ml Dexamethasone;Therapeutic activities;Therapeutic exercise;Patient/family education;Orthotic Fit/Training;Neuromuscular re-education;Manual techniques;Taping;Dry needling;Passive range of motion;Scar mobilization     PT Next Visit Plan  Increase reps with 1# weight    Consulted and Agree with Plan of Care  Patient       Patient will benefit from skilled therapeutic intervention in order to improve the following deficits and impairments:  Decreased activity tolerance, Impaired flexibility, Impaired UE functional use, Hypomobility, Decreased strength, Decreased range of motion, Decreased endurance, Decreased coordination, Decreased scar mobility, Increased muscle spasms, Postural dysfunction, Pain, Improper body mechanics  Visit Diagnosis: Stiffness of right shoulder, not elsewhere classified  Muscle weakness (generalized)  Acute pain of right shoulder  Abnormal posture     Problem List Patient Active Problem List   Diagnosis Date Noted  . S/P arthroscopy of right shoulder 08/13/2017  . Genetic testing 05/29/2017  . Family history of breast cancer   . Hyperlipidemia LDL goal <70 12/30/2015  . Cerebrovascular disease or lesion 12/30/2015  . Mild pulmonary hypertension ([place] 12/30/2015  . TIA (transient ischemic attack) 12/28/2015  . GERD (gastroesophageal reflux disease) 07/23/2015  . Chronic pain 07/23/2015  . Obese 07/19/2015  . S/P left THA, AA 07/17/2015  . 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 Insomnia 03/03/2011  . OSA (obstructive sleep apnea) 03/03/2011  . Depression, major, recurrent (  [place]) 12/02/2010  . CONSTIPATION, SLOW TRANSIT 10/04/2010  . IRRITABLE BOWEL SYNDROME 08/14/2009  . CYSTITIS, CHRONIC INTERSTITIAL 06/27/2009  . UNSPECIFIED HYPOTHYROIDISM 05/16/2009  . LIPOMA OF OTHER SPECIFIED SITES 04/13/2009  . BRUXISM 04/13/2009  . SYNCOPE 03/13/2009  . Abnormal finding on MRI of brain 02/07/2009  . MUSCLE WEAKNESS (GENERALIZED) 12/20/2008  . UNSPECIFIED ALLERGIC ALVEOLITIS AND PNEUMONITIS 08/02/2008  . HIP PAIN, LEFT, CHRONIC 07/04/2008  . MALAISE AND FATIGUE 07/04/2008  . LOW BACK PAIN 05/26/2008  . PROTEINURIA 12/23/2007  . HYPERCALCEMIA 11/18/2007  . Essential  hypertension 09/14/2007  . ANEMIA, B12 DEFICIENCY 05/18/2007  . 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 ALLERGIC RHINITIS 03/29/2007  . FIBROMYALGIA 03/29/2007    Karyssa Amaral, PTA 11/04/2017, 4:45 PM  [facility] [address], [place], [place], [address] Phone: [phone]   Fax:  [phone]  Name: Kristin Pope MRN: [id number] Date of Birth: [birth year]

## 2017-11-10 ENCOUNTER — Encounter: Payer: Self-pay | Admitting: Physical Therapy

## 2017-11-10 ENCOUNTER — Ambulatory Visit: Payer: Medicaid Other | Admitting: Physical Therapy

## 2017-11-10 DIAGNOSIS — M6281 Muscle weakness (generalized): Secondary | ICD-10-CM

## 2017-11-10 DIAGNOSIS — M25511 Pain in right shoulder: Secondary | ICD-10-CM

## 2017-11-10 DIAGNOSIS — M25611 Stiffness of right shoulder, not elsewhere classified: Secondary | ICD-10-CM | POA: Diagnosis not present

## 2017-11-10 DIAGNOSIS — R293 Abnormal posture: Secondary | ICD-10-CM

## 2017-11-10 NOTE — Therapy (Signed)
[facility] [address] [place], [place], [address] Phone: [phone]   Fax:  [phone]  Physical Therapy Treatment  Patient Details  Name: Kristin Pope MRN: [id number] Date of Birth: Jan 10, 1956 Referring Provider: Hart Robinsons, MD    Encounter Date: 11/10/2017  PT End of Session - 11/10/17 1658    Visit Number  2 1/12    Number of Visits  12    Date for PT Re-Evaluation  01/21/18    Authorization Type  11/10/17 to 02/02/18 (medicaid approval pending)    Authorization Time Period  Medicaid approval:     Authorization - Visit Number  2    Authorization - Number of Visits  12    PT Start Time  2355    PT Stop Time  1612    PT Time Calculation (min)  42 min    Activity Tolerance  Patient tolerated treatment well;No increased pain    Behavior During Therapy  WFL for tasks assessed/performed       Past Medical History:  Diagnosis Date  . Allergic rhinitis   . Anemia, pernicious    b12 def.  . Anxiety   . Arthritis   . Biceps tendon tear    right  . Cervical spondylosis with radiculopathy    C2 -- C7  . Chronic fatigue   . Chronic pain    neck, back  . DDD (degenerative disc disease), cervical   . DDD (degenerative disc disease), lumbosacral   . Depression, major, recurrent ([place])   . Diverticulosis of colon   . Eczema   . Family history of adverse reaction to anesthesia    sister has problems waking up  . Family history of breast cancer   . Fibromyalgia 03/29/2007  . GERD (gastroesophageal reflux disease)   . Headache    constant headaches  . History of colonic diverticulitis 08/16/2010   w/ perforation (lower GI bleed)--- resolved without surgerical intervention  . 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 Hx of pyelonephritis 09/2005   due to UTI  . Hypertension   . IBS (irritable bowel syndrome)    internates  between constpation/ diarrhea  . Lumbosacral spondylosis    L2-3, L4-5  . Mild obstructive sleep apnea    per study 06/ 2009 mild osa  AHI 13/hr---  recommendation given mouth appliance, loss wt., cpap  . Mixed hyperlipidemia   . OA (osteoarthritis)    right shoulder AC joint  . Pre-diabetes   . Right rotator cuff tear     Past Surgical History:  Procedure Laterality Date  . BREAST SURGERY     breast biopsy-benign  . COLONOSCOPY  last one 08-08-2009  . DILATATION & CURRETTAGE/HYSTEROSCOPY WITH RESECTOCOPE  02-03-2011   dr Dellis Filbert  [facility]   polypectomy  . RADIAL OPTIC NEUROTOMY     twice in lumbar area of back-every 6 months  . SHOULDER ARTHROSCOPY WITH ROTATOR CUFF REPAIR Right 08/13/2017   Procedure: RIGHT SHOULDER ARTHROSCOPY, DEBRIDEMENT, BICEPS TENOTOMY, ROTATOR CUFF REPAIR, DISTAL CLAVICLE RESECTION;  Surgeon: Sydnee Cabal, MD;  Location: [facility];  Service: Orthopedics;  Laterality: Right;  . TOTAL HIP ARTHROPLASTY Left 07/17/2015   Procedure: LEFT TOTAL HIP ARTHROPLASTY ANTERIOR APPROACH;  Surgeon: Paralee Cancel, MD;  [facility];  Service: Orthopedics;  Laterality: Left;  . TOTAL HIP ARTHROPLASTY Right 08/21/2015   Procedure: RIGHT TOTAL HIP ARTHROPLASTY ANTERIOR APPROACH;  Surgeon: Paralee Cancel, MD;  [facility];  Service: Orthopedics;  Laterality: Right;  . TRANSTHORACIC ECHOCARDIOGRAM  12/30/2015   ef 78-67%, grade 1 diastolic dysfunction/  mild MR/ trivial TR    There were no vitals filed for this visit.  Subjective Assessment - 11/10/17 1533    Subjective  Pt reports that she has some pain in the Rt shoulder/bicep region. She says it is sharp at times, but is unable to describe the pain or specific causes at this time. She states she is completing her HEP about every other day.     Currently in Pain?  Yes    Pain Score  7     Pain Location  -- Rt bicep region    Pain Orientation  Right;Anterior    Pain Descriptors / Indicators  Sharp     Pain Type  Acute pain    Pain Radiating Towards  none     Pain Onset  More than a month ago    Pain Frequency  Constant    Aggravating Factors   unsure    Pain Relieving Factors  unsure, hasn't tried much    Effect of Pain on Daily Activities  moderate pain                       [facility] Adult PT Treatment/Exercise - 11/10/17 0001      Shoulder Exercises: Supine   Other Supine Exercises  Rt shoulder D2 flexion x10 reps without resistance, added yelllow TB resistance x5 reps      Shoulder Exercises: Seated   Other Seated Exercises  shoulder rolls forward and backward x20 reps each       Shoulder Exercises: Sidelying   External Rotation  Right;Strengthening;20 reps    External Rotation Weight (lbs)  1    External Rotation Limitations  towel underneath arm     ABduction  Strengthening;Right;20 reps;Weights    ABduction Weight (lbs)  1    ABduction Limitations  cues to decrease scapula shrugging     Other Sidelying Exercises  Rt shoulder horizontal abduction with 1# dumbbell x20 reps       Shoulder Exercises: ROM/Strengthening   UBE (Upper Arm Bike)  L0 x5 min  PT present to discuss session and POC    Wall Pushups  15 reps;Limitations    Wall Pushups Limitations  x2 sets     Rhythmic Stabilization, Seated  elbow extended arm circles (below 90 deg) 3x30 sec     Other ROM/Strengthening Exercises  closed chain serratus press x20 reps (heavy tactile/verbal cues to decrease upper trap activation)              PT Education - 11/10/17 1535    Education provided  Yes    Education Details  importance of completing HEP regularly due to restricted number of visits     Person(s) Educated  Patient    Methods  Explanation    Comprehension  Verbalized understanding       PT Short Term Goals - 11/04/17 1628      PT SHORT TERM GOAL #1   Title  Pt will demo consistency and independence with her HEP to decrease inflammation and improve shoulder ROM.    Time  4    Period   Weeks    Status  Achieved        PT Long Term Goals - 10/22/17 0857      PT LONG TERM GOAL #1  Title  Pt will be independent in advanced HEP to allow for continuous progression towards her goals.     Time  12    Period  Weeks    Status  On-going    Target Date  02/02/18      PT LONG TERM GOAL #2   Title  Pt will demonstrate atleast a 9 point improvement in her UEFI to reflect and improvement in her UE functional use.     Baseline  28/80    Time  12    Period  Weeks    Status  On-going      PT LONG TERM GOAL #3   Title  Pt will demo atleast 155 deg of active Rt shoulder flexion with minimal shoulder shrug to allow her to reach over head into her cabinet without difficulty.     Baseline  130 deg flexion    Time  12    Status  Partially Met      PT LONG TERM GOAL #4   Title  Pt will report atleast 50% decrease in her pain and stiffness from the start of PT to allow for improved quality of life and daily activity tolerance.     Baseline  30%    Time  12    Period  Weeks    Status  Revised      PT LONG TERM GOAL #5   Title  Pt will demo improved shoulder strength and endurance evident by her ability to maintain RUE elevated above her head for up to 10 min, and her report of being able to successfully wash her hair without difficulty.     Baseline  unable to maintain shoulder elevated above head for more than 2 min or so without fatigue/difficulty    Time  12    Period  Weeks    Status  Not Met      PT LONG TERM GOAL #6   Title  Pt will demo improved RUE strength to atleast 4/5 MMT which will allow her to lift small cans/objects overhead into her cabinet at home.     Time  12    Period  Weeks            Plan - 11/10/17 1658    Clinical Impression Statement  Today's session continued with progressions of shoulder strength and AROM in supine and sidelying positions. Pt does demonstrate significant upper trap and biceps compensation during Rt shoulder elevation, which  only improved some following heavy cuing from the therapist. Address scapula control and mobility some during this session, but will continue to need to focus on this in future sessions to ensure good pt understanding and motor learning. Pt reported no increase in pain, although she did have Rt shoulder fatigue end of today's session.     Rehab Potential  Fair    Clinical Impairments Affecting Rehab Potential  (-) limited visits due to insurance     PT Frequency  Other (comment)    PT Duration  12 weeks    PT Treatment/Interventions  ADLs/Self Care Home Management;Cryotherapy;Electrical Stimulation;Moist Heat;Iontophoresis 67m/ml Dexamethasone;Therapeutic activities;Therapeutic exercise;Patient/family education;Orthotic Fit/Training;Neuromuscular re-education;Manual techniques;Taping;Dry needling;Passive range of motion;Scar mobilization    PT Next Visit Plan  scapula control/mobility, serratus strengthening, middle/low trap strengthening, Rt shoulder AROM progressions to 2 or 3# next session    Consulted and Agree with Plan of Care  Patient       Patient will benefit from skilled therapeutic intervention in order to improve  the following deficits and impairments:  Decreased activity tolerance, Impaired flexibility, Impaired UE functional use, Hypomobility, Decreased strength, Decreased range of motion, Decreased endurance, Decreased coordination, Decreased scar mobility, Increased muscle spasms, Postural dysfunction, Pain, Improper body mechanics  Visit Diagnosis: Stiffness of right shoulder, not elsewhere classified  Muscle weakness (generalized)  Acute pain of right shoulder  Abnormal posture     Problem List Patient Active Problem List   Diagnosis Date Noted  . S/P arthroscopy of right shoulder 08/13/2017  . Genetic testing 05/29/2017  . Family history of breast cancer   . Hyperlipidemia LDL goal <70 12/30/2015  . Cerebrovascular disease or lesion 12/30/2015  . Mild pulmonary  hypertension ([place]) 12/30/2015  . TIA (transient ischemic attack) 12/28/2015  . GERD (gastroesophageal reflux disease) 07/23/2015  . Chronic pain 07/23/2015  . Obese 07/19/2015  . S/P left THA, AA 07/17/2015  . 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 Insomnia 03/03/2011  . OSA (obstructive sleep apnea) 03/03/2011  . Depression, major, recurrent ([place]) 12/02/2010  . CONSTIPATION, SLOW TRANSIT 10/04/2010  . IRRITABLE BOWEL SYNDROME 08/14/2009  . CYSTITIS, CHRONIC INTERSTITIAL 06/27/2009  . UNSPECIFIED HYPOTHYROIDISM 05/16/2009  . LIPOMA OF OTHER SPECIFIED SITES 04/13/2009  . BRUXISM 04/13/2009  . SYNCOPE 03/13/2009  . Abnormal finding on MRI of brain 02/07/2009  . MUSCLE WEAKNESS (GENERALIZED) 12/20/2008  . UNSPECIFIED ALLERGIC ALVEOLITIS AND PNEUMONITIS 08/02/2008  . HIP PAIN, LEFT, CHRONIC 07/04/2008  . MALAISE AND FATIGUE 07/04/2008  . LOW BACK PAIN 05/26/2008  . PROTEINURIA 12/23/2007  . HYPERCALCEMIA 11/18/2007  . Essential hypertension 09/14/2007  . ANEMIA, B12 DEFICIENCY 05/18/2007  . 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 ALLERGIC RHINITIS 03/29/2007  . FIBROMYALGIA 03/29/2007    5:08 PM,11/10/17 Sherol Dade PT, DPT [place] [facility] [place]  [facility] [address] [place], [place], [address] Phone: [phone]   Fax:  [phone]  Name: RHEMI BALBACH MRN: [id number] Date of Birth: [birth year]

## 2017-11-16 ENCOUNTER — Encounter: Payer: Self-pay | Admitting: Physical Therapy

## 2017-11-16 ENCOUNTER — Ambulatory Visit: Payer: Medicaid Other | Admitting: Physical Therapy

## 2017-11-16 DIAGNOSIS — R293 Abnormal posture: Secondary | ICD-10-CM

## 2017-11-16 DIAGNOSIS — M6281 Muscle weakness (generalized): Secondary | ICD-10-CM

## 2017-11-16 DIAGNOSIS — M25611 Stiffness of right shoulder, not elsewhere classified: Secondary | ICD-10-CM

## 2017-11-16 DIAGNOSIS — M25511 Pain in right shoulder: Secondary | ICD-10-CM

## 2017-11-16 NOTE — Therapy (Signed)
[facility] [address], [place], [place], [address] Phone: [phone]   Fax:  [phone]  Physical Therapy Treatment  Patient Details  Name: Kristin Pope MRN: [id number] Date of Birth: 07-22-1956 Referring Provider: Hart Robinsons, MD    Encounter Date: 11/16/2017  PT End of Session - 11/16/17 1533    Visit Number  3    Number of Visits  12    Date for PT Re-Evaluation  01/21/18    Authorization Type  11/10/17 to 02/02/18 (medicaid approval pending)    PT Start Time  1740    PT Stop Time  1610    PT Time Calculation (min)  39 min    Activity Tolerance  Patient tolerated treatment well;No increased pain    Behavior During Therapy  WFL for tasks assessed/performed       Past Medical History:  Diagnosis Date  . Allergic rhinitis   . Anemia, pernicious    b12 def.  . Anxiety   . Arthritis   . Biceps tendon tear    right  . Cervical spondylosis with radiculopathy    C2 -- C7  . Chronic fatigue   . Chronic pain    neck, back  . DDD (degenerative disc disease), cervical   . DDD (degenerative disc disease), lumbosacral   . Depression, major, recurrent ([place])   . Diverticulosis of colon   . Eczema   . Family history of adverse reaction to anesthesia    sister has problems waking up  . Family history of breast cancer   . Fibromyalgia 03/29/2007  . GERD (gastroesophageal reflux disease)   . Headache    constant headaches  . History of colonic diverticulitis 08/16/2010   w/ perforation (lower GI bleed)--- resolved without surgerical intervention  . 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 Hx of pyelonephritis 09/2005   due to UTI  . Hypertension   . IBS (irritable bowel syndrome)    internates between constpation/ diarrhea  . Lumbosacral spondylosis    L2-3, L4-5  . Mild obstructive sleep apnea    per study 06/ 2009 mild osa   AHI 13/hr---  recommendation given mouth appliance, loss wt., cpap  . Mixed hyperlipidemia   . OA (osteoarthritis)    right shoulder AC joint  . Pre-diabetes   . Right rotator cuff tear     Past Surgical History:  Procedure Laterality Date  . BREAST SURGERY     breast biopsy-benign  . COLONOSCOPY  last one 08-08-2009  . DILATATION & CURRETTAGE/HYSTEROSCOPY WITH RESECTOCOPE  02-03-2011   dr Dellis Filbert  [facility]   polypectomy  . RADIAL OPTIC NEUROTOMY     twice in lumbar area of back-every 6 months  . SHOULDER ARTHROSCOPY WITH ROTATOR CUFF REPAIR Right 08/13/2017   Procedure: RIGHT SHOULDER ARTHROSCOPY, DEBRIDEMENT, BICEPS TENOTOMY, ROTATOR CUFF REPAIR, DISTAL CLAVICLE RESECTION;  Surgeon: Sydnee Cabal, MD;  Location: [facility];  Service: Orthopedics;  Laterality: Right;  . TOTAL HIP ARTHROPLASTY Left 07/17/2015   Procedure: LEFT TOTAL HIP ARTHROPLASTY ANTERIOR APPROACH;  Surgeon: Paralee Cancel, MD;  [facility];  Service: Orthopedics;  Laterality: Left;  . TOTAL HIP ARTHROPLASTY Right 08/21/2015   Procedure: RIGHT TOTAL HIP ARTHROPLASTY ANTERIOR APPROACH;  Surgeon: Paralee Cancel, MD;  [facility];  Service: Orthopedics;  Laterality: Right;  . TRANSTHORACIC ECHOCARDIOGRAM  12/30/2015   ef 81-44%, grade 1 diastolic dysfunction/  mild MR/ trivial TR    There were no vitals filed for this visit.  Subjective Assessment - 11/16/17 1534    Subjective  No adverse effects from last session    Currently in Pain?  Yes    Pain Score  2     Pain Location  Shoulder    Pain Orientation  Right    Pain Descriptors / Indicators  Dull    Aggravating Factors   Unsure....cold and rainy    Pain Relieving Factors  Unsure    Multiple Pain Sites  No                      [facility] Adult PT Treatment/Exercise - 11/16/17 0001      Shoulder Exercises: Supine   Horizontal ABduction  -- yellow band 2x 8    External Rotation  -- Yellow band ER 2x 6    Flexion  -- Cane with  2# on it 2x10    Other Supine Exercises  RT shoulder D2 flexion 2x6 with yellow band Pt only need initial cuing for scap depression      Shoulder Exercises: Sidelying   External Rotation  Strengthening;Right;Weights    External Rotation Weight (lbs)  1 2x15    External Rotation Limitations  towel underneath arm     ABduction  AROM;Strengthening;Right;Weights    ABduction Weight (lbs)  1 2x15      Shoulder Exercises: Standing   Other Standing Exercises  Closed chain at the wall ball movements up & dwon 2 x10, VC/TC ffor RTC facilitation      Shoulder Exercises: Pulleys   Flexion  3 minutes      Shoulder Exercises: ROM/Strengthening   UBE (Upper Arm Bike)  L0 x5 min  VC for posture and softening her neck and upper traps.    Wall Pushups  15 reps;Limitations 2 sets               PT Short Term Goals - 11/04/17 1628      PT SHORT TERM GOAL #1   Title  Pt will demo consistency and independence with her HEP to decrease inflammation and improve shoulder ROM.    Time  4    Period  Weeks    Status  Achieved        PT Long Term Goals - 10/22/17 0857      PT LONG TERM GOAL #1   Title  Pt will be independent in advanced HEP to allow for continuous progression towards her goals.     Time  12    Period  Weeks    Status  On-going    Target Date  02/02/18      PT LONG TERM GOAL #2   Title  Pt will demonstrate atleast a 9 point improvement in her UEFI to reflect and improvement in her UE functional use.     Baseline  28/80    Time  12    Period  Weeks    Status  On-going      PT LONG TERM GOAL #3   Title  Pt will demo atleast 155 deg of active Rt shoulder flexion with minimal shoulder shrug to allow her to reach over head into her cabinet without difficulty.     Baseline  130 deg flexion    Time  12    Status  Partially Met      PT LONG TERM GOAL #4   Title  Pt will  report atleast 50% decrease in her pain and stiffness from the start of PT to allow for improved quality  of life and daily activity tolerance.     Baseline  30%    Time  12    Period  Weeks    Status  Revised      PT LONG TERM GOAL #5   Title  Pt will demo improved shoulder strength and endurance evident by her ability to maintain RUE elevated above her head for up to 10 min, and her report of being able to successfully wash her hair without difficulty.     Baseline  unable to maintain shoulder elevated above head for more than 2 min or so without fatigue/difficulty    Time  12    Period  Weeks    Status  Not Met      PT LONG TERM GOAL #6   Title  Pt will demo improved RUE strength to atleast 4/5 MMT which will allow her to lift small cans/objects overhead into her cabinet at home.     Time  12    Period  Weeks            Plan - 11/16/17 1533    Clinical Impression Statement  Pt demonstrated improvement in regards to scapular depression when moving her RTUE. She only needed the initial verbal cue, then performed the remaining exercise with excellent form. Pt had most difficulty performoing closed chain ball movements on the wall while keeping her scapula from elevating.     Rehab Potential  Fair    Clinical Impairments Affecting Rehab Potential  (-) limited visits due to insurance     PT Frequency  Other (comment)    PT Duration  12 weeks    PT Treatment/Interventions  ADLs/Self Care Home Management;Cryotherapy;Electrical Stimulation;Moist Heat;Iontophoresis 93m/ml Dexamethasone;Therapeutic activities;Therapeutic exercise;Patient/family education;Orthotic Fit/Training;Neuromuscular re-education;Manual techniques;Taping;Dry needling;Passive range of motion;Scar mobilization    PT Next Visit Plan  scapula control/mobility, serratus strengthening, middle/low trap strengthening, Rt shoulder AROM progressions to 2# next.    Consulted and Agree with Plan of Care  Patient       Patient will benefit from skilled therapeutic intervention in order to improve the following deficits and  impairments:  Decreased activity tolerance, Impaired flexibility, Impaired UE functional use, Hypomobility, Decreased strength, Decreased range of motion, Decreased endurance, Decreased coordination, Decreased scar mobility, Increased muscle spasms, Postural dysfunction, Pain, Improper body mechanics  Visit Diagnosis: Stiffness of right shoulder, not elsewhere classified  Muscle weakness (generalized)  Acute pain of right shoulder  Abnormal posture     Problem List Patient Active Problem List   Diagnosis Date Noted  . S/P arthroscopy of right shoulder 08/13/2017  . Genetic testing 05/29/2017  . Family history of breast cancer   . Hyperlipidemia LDL goal <70 12/30/2015  . Cerebrovascular disease or lesion 12/30/2015  . [place] pulmonary hypertension [place] 12/30/2015  . TIA (transient ischemic attack) 12/28/2015  . GERD (gastroesophageal reflux disease) 07/23/2015  . Chronic pain 07/23/2015  . Obese 07/19/2015  . S/P left THA, AA 07/17/2015  . 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 Insomnia 03/03/2011  . OSA (obstructive sleep apnea) 03/03/2011  . Depression, major, recurrent ([place] 12/02/2010  . CONSTIPATION, SLOW TRANSIT 10/04/2010  . IRRITABLE BOWEL SYNDROME 08/14/2009  . CYSTITIS, CHRONIC INTERSTITIAL 06/27/2009  . UNSPECIFIED HYPOTHYROIDISM 05/16/2009  . LIPOMA OF OTHER SPECIFIED SITES 04/13/2009  . BRUXISM 04/13/2009  . SYNCOPE 03/13/2009  . Abnormal finding on MRI of brain 02/07/2009  .  MUSCLE WEAKNESS (GENERALIZED) 12/20/2008  . UNSPECIFIED ALLERGIC ALVEOLITIS AND PNEUMONITIS 08/02/2008  . HIP PAIN, LEFT, CHRONIC 07/04/2008  . MALAISE AND FATIGUE 07/04/2008  . LOW BACK PAIN 05/26/2008  . PROTEINURIA 12/23/2007  . HYPERCALCEMIA 11/18/2007  . Essential hypertension 09/14/2007  . ANEMIA, B12 DEFICIENCY 05/18/2007  . 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 ALLERGIC RHINITIS 03/29/2007  . FIBROMYALGIA 03/29/2007    Kristin Pope,  PTA 11/16/2017, 4:09 PM  [facility] [address] [place], [place], [address] Phone: [phone]   Fax:  [phone]  Name: Kristin Pope MRN: [id number] Date of Birth: 1955-11-16

## 2017-11-19 ENCOUNTER — Ambulatory Visit: Payer: Medicaid Other | Admitting: Physical Therapy

## 2017-11-19 ENCOUNTER — Encounter: Payer: Self-pay | Admitting: Physical Therapy

## 2017-11-19 DIAGNOSIS — M6281 Muscle weakness (generalized): Secondary | ICD-10-CM

## 2017-11-19 DIAGNOSIS — R293 Abnormal posture: Secondary | ICD-10-CM

## 2017-11-19 DIAGNOSIS — M25511 Pain in right shoulder: Secondary | ICD-10-CM

## 2017-11-19 DIAGNOSIS — M25611 Stiffness of right shoulder, not elsewhere classified: Secondary | ICD-10-CM

## 2017-11-19 NOTE — Patient Instructions (Signed)
   SCAPULAR RETRACTION [place]  Tuck chin back as you pinch shoulder blades together. Spend 5-10 minutes during the day doing this in front of the mirror.    [facility] [address] [place], [place] [address] Phone # [phone] Fax [phone]

## 2017-11-19 NOTE — Therapy (Signed)
[facility] [address], [place], [place], [address] Phone: [phone]   Fax:  [phone]  Physical Therapy Treatment  Patient Details  Name: Kristin Pope MRN: [id number] Date of Birth: [birth year] Referring Provider: Hart Robinsons, MD    Encounter Date: 11/19/2017  PT End of Session - 11/19/17 1555    Visit Number  4    Number of Visits  12    Date for PT Re-Evaluation  01/21/18    Authorization Type  11/10/17 to 02/02/18 (medicaid approval pending)    PT Start Time  1524    PT Stop Time  1615 10 min untimed Nustep/PT not present    PT Time Calculation (min)  51 min    Activity Tolerance  Patient tolerated treatment well;No increased pain    Behavior During Therapy  WFL for tasks assessed/performed       Past Medical History:  Diagnosis Date  . Allergic rhinitis   . Anemia, pernicious    b12 def.  . Anxiety   . Arthritis   . Biceps tendon tear    right  . Cervical spondylosis with radiculopathy    C2 -- C7  . Chronic fatigue   . Chronic pain    neck, back  . DDD (degenerative disc disease), cervical   . DDD (degenerative disc disease), lumbosacral   . Depression, major, recurrent ([place])   . Diverticulosis of colon   . Eczema   . Family history of adverse reaction to anesthesia    sister has problems waking up  . Family history of breast cancer   . Fibromyalgia 03/29/2007  . GERD (gastroesophageal reflux disease)   . Headache    constant headaches  . History of colonic diverticulitis 08/16/2010   w/ perforation (lower GI bleed)--- resolved without surgerical intervention  . 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 Hx of pyelonephritis 09/2005   due to UTI  . Hypertension   . IBS (irritable bowel syndrome)    internates between constpation/ diarrhea  . Lumbosacral spondylosis    L2-3, L4-5  . Mild obstructive sleep  apnea    per study 06/ 2009 mild osa  AHI 13/hr---  recommendation given mouth appliance, loss wt., cpap  . Mixed hyperlipidemia   . OA (osteoarthritis)    right shoulder AC joint  . Pre-diabetes   . Right rotator cuff tear     Past Surgical History:  Procedure Laterality Date  . BREAST SURGERY     breast biopsy-benign  . COLONOSCOPY  last one 08-08-2009  . DILATATION & CURRETTAGE/HYSTEROSCOPY WITH RESECTOCOPE  02-03-2011   dr Dellis Filbert  [facility]   polypectomy  . RADIAL OPTIC NEUROTOMY     twice in lumbar area of back-every 6 months  . SHOULDER ARTHROSCOPY WITH ROTATOR CUFF REPAIR Right 08/13/2017   Procedure: RIGHT SHOULDER ARTHROSCOPY, DEBRIDEMENT, BICEPS TENOTOMY, ROTATOR CUFF REPAIR, DISTAL CLAVICLE RESECTION;  Surgeon: Sydnee Cabal, MD;  Location: [facility];  Service: Orthopedics;  Laterality: Right;  . TOTAL HIP ARTHROPLASTY Left 07/17/2015   Procedure: LEFT TOTAL HIP ARTHROPLASTY ANTERIOR APPROACH;  Surgeon: Paralee Cancel, MD;  [facility];  Service: Orthopedics;  Laterality: Left;  . TOTAL HIP ARTHROPLASTY Right 08/21/2015   Procedure: RIGHT TOTAL HIP ARTHROPLASTY ANTERIOR APPROACH;  Surgeon: Paralee Cancel, MD;  [facility];  Service: Orthopedics;  Laterality: Right;  . TRANSTHORACIC ECHOCARDIOGRAM  12/30/2015   ef  14-97%, grade 1 diastolic dysfunction/  mild MR/ trivial TR    There were no vitals filed for this visit.  Subjective Assessment - 11/19/17 1529    Subjective  Pt reports that things are going well. She was able to see the surgery yesterday and she says he is pleased with her progress so far. She has not been doing alot of exercise to stretch behind her back at home and wants to do that today.     Currently in Pain?  No/denies                      [facility] Adult PT Treatment/Exercise - 11/19/17 0001      Shoulder Exercises: Seated   External Rotation  Both;10 reps;Theraband;Limitations    Theraband Level (Shoulder External  Rotation)  Level 1 (Yellow)    External Rotation Limitations  Rt shoulder only for 2nd set with therapist providing graded resistance to ensure strengthening through full range    Internal Rotation  Right;15 reps;Theraband;Limitations    Theraband Level (Shoulder Internal Rotation)  Level 1 (Yellow)    Internal Rotation Limitations  elbow by side    Other Seated Exercises  incline shoulder abduction and flexion with 1# dumbbell x10 reps (AAROM for last 4 reps on Rt during abduction)       Shoulder Exercises: Standing   Other Standing Exercises  Bicep curls with 2# dumbbells     Other Standing Exercises  closed chain serratus press attempt but pt unable to coordinate despite therapist cuing; BUE pressed into wall with forward/backward step x15 reps to encourage scapula movement anterior/posterior.     Shoulder Exercises: ROM/Strengthening   Wall Pushups  10 reps    Wall Pushups Limitations  x2 sets (heavy cuing to decrease Rt shoulder shrug and encourage weight shift to Rt.)              PT Education - 11/19/17 1611    Education provided  Yes    Education Details  encouraged more consistent HEP adherence; importance of having good scapula control and strength in order to get the most out of strength training    Person(s) Educated  Patient    Methods  Explanation;Verbal cues;Tactile cues;Demonstration;Handout    Comprehension  Verbalized understanding;Tactile cues required;Need further instruction;Verbal cues required       PT Short Term Goals - 11/04/17 1628      PT SHORT TERM GOAL #1   Title  Pt will demo consistency and independence with her HEP to decrease inflammation and improve shoulder ROM.    Time  4    Period  Weeks    Status  Achieved        PT Long Term Goals - 10/22/17 0857      PT LONG TERM GOAL #1   Title  Pt will be independent in advanced HEP to allow for continuous progression towards her goals.     Time  12    Period  Weeks    Status  On-going     Target Date  02/02/18      PT LONG TERM GOAL #2   Title  Pt will demonstrate atleast a 9 point improvement in her UEFI to reflect and improvement in her UE functional use.     Baseline  28/80    Time  12    Period  Weeks    Status  On-going      PT LONG TERM GOAL #3   Title  Pt will demo atleast 155 deg of active Rt shoulder flexion with minimal shoulder shrug to allow her to reach over head into her cabinet without difficulty.     Baseline  130 deg flexion    Time  12    Status  Partially Met      PT LONG TERM GOAL #4   Title  Pt will report atleast 50% decrease in her pain and stiffness from the start of PT to allow for improved quality of life and daily activity tolerance.     Baseline  30%    Time  12    Period  Weeks    Status  Revised      PT LONG TERM GOAL #5   Title  Pt will demo improved shoulder strength and endurance evident by her ability to maintain RUE elevated above her head for up to 10 min, and her report of being able to successfully wash her hair without difficulty.     Baseline  unable to maintain shoulder elevated above head for more than 2 min or so without fatigue/difficulty    Time  12    Period  Weeks    Status  Not Met      PT LONG TERM GOAL #6   Title  Pt will demo improved RUE strength to atleast 4/5 MMT which will allow her to lift small cans/objects overhead into her cabinet at home.     Time  12    Period  Weeks            Plan - 11/19/17 1613    Clinical Impression Statement  Pt continues to demonstrate Rt shoulder shrug during activity, particularly with progressions of exercises completed this session. Pt's HEP adherence is moderate, and she continues to require heavy cuing with both closed and open scapular movements. This was added to her HEP and she was able to demonstrate good understanding by the end of the session. Pt was able to complete some exercises in an inclined position which is a progression from previous sessions in  supine/sidelying. Will likely need to continue with exercises these positions to decrease the risk of posture compensations during progressive UE strengthening.     Rehab Potential  Fair    Clinical Impairments Affecting Rehab Potential  (-) limited visits due to insurance     PT Frequency  Other (comment)    PT Duration  12 weeks    PT Treatment/Interventions  ADLs/Self Care Home Management;Cryotherapy;Electrical Stimulation;Moist Heat;Iontophoresis 52m/ml Dexamethasone;Therapeutic activities;Therapeutic exercise;Patient/family education;Orthotic Fit/Training;Neuromuscular re-education;Manual techniques;Taping;Dry needling;Passive range of motion;Scar mobilization    PT Next Visit Plan  scapula control/mobility (mirror feedback if needed), serratus strengthening supine, Rt shoulder AROM progressions to 2# next or 1# in inclined position.    PT Home Exercise Plan  sidelying shoulder Abd AROM, supine shoulder flexion AROM, towel IR stretch, sidelying shoulder ER AROM    Consulted and Agree with Plan of Care  Patient       Patient will benefit from skilled therapeutic intervention in order to improve the following deficits and impairments:  Decreased activity tolerance, Impaired flexibility, Impaired UE functional use, Hypomobility, Decreased strength, Decreased range of motion, Decreased endurance, Decreased coordination, Decreased scar mobility, Increased muscle spasms, Postural dysfunction, Pain, Improper body mechanics  Visit Diagnosis: Stiffness of right shoulder, not elsewhere classified  Muscle weakness (generalized)  Acute pain of right shoulder  Abnormal posture     Problem List Patient Active Problem List   Diagnosis Date Noted  .  S/P arthroscopy of right shoulder 08/13/2017  . Genetic testing 05/29/2017  . Family history of breast cancer   . Hyperlipidemia LDL goal <70 12/30/2015  . Cerebrovascular disease or lesion 12/30/2015  . Mild pulmonary hypertension ([place]) 12/30/2015   . TIA (transient ischemic attack) 12/28/2015  . GERD (gastroesophageal reflux disease) 07/23/2015  . Chronic pain 07/23/2015  . Obese 07/19/2015  . S/P left THA, AA 07/17/2015  . 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 Insomnia 03/03/2011  . OSA (obstructive sleep apnea) 03/03/2011  . Depression, major, recurrent ([place]) 12/02/2010  . CONSTIPATION, SLOW TRANSIT 10/04/2010  . IRRITABLE BOWEL SYNDROME 08/14/2009  . CYSTITIS, CHRONIC INTERSTITIAL 06/27/2009  . UNSPECIFIED HYPOTHYROIDISM 05/16/2009  . LIPOMA OF OTHER SPECIFIED SITES 04/13/2009  . BRUXISM 04/13/2009  . SYNCOPE 03/13/2009  . Abnormal finding on MRI of brain 02/07/2009  . MUSCLE WEAKNESS (GENERALIZED) 12/20/2008  . UNSPECIFIED ALLERGIC ALVEOLITIS AND PNEUMONITIS 08/02/2008  . HIP PAIN, LEFT, CHRONIC 07/04/2008  . MALAISE AND FATIGUE 07/04/2008  . LOW BACK PAIN 05/26/2008  . PROTEINURIA 12/23/2007  . HYPERCALCEMIA 11/18/2007  . Essential hypertension 09/14/2007  . ANEMIA, B12 DEFICIENCY 05/18/2007  . 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 ALLERGIC RHINITIS 03/29/2007  . FIBROMYALGIA 03/29/2007    4:24 PM,11/19/17 Sherol Dade PT, DPT [facility] [address] [place], [place], [address] Phone: [phone]   Fax:  [phone]  Name: Kristin Pope MRN: [id number] Date of Birth: [birth year]

## 2017-11-24 ENCOUNTER — Ambulatory Visit: Payer: Medicaid Other | Admitting: Physical Therapy

## 2017-11-24 ENCOUNTER — Encounter: Payer: Self-pay | Admitting: Physical Therapy

## 2017-11-24 DIAGNOSIS — M25511 Pain in right shoulder: Secondary | ICD-10-CM

## 2017-11-24 DIAGNOSIS — M6281 Muscle weakness (generalized): Secondary | ICD-10-CM

## 2017-11-24 DIAGNOSIS — M25611 Stiffness of right shoulder, not elsewhere classified: Secondary | ICD-10-CM | POA: Diagnosis not present

## 2017-11-24 DIAGNOSIS — R293 Abnormal posture: Secondary | ICD-10-CM

## 2017-11-24 NOTE — Therapy (Signed)
[facility] [address], [place], [place], [address] Phone: [phone]   Fax:  [phone]  Physical Therapy Treatment  Patient Details  Name: Kristin Pope MRN: [id number] Date of Birth: 1955-10-28 Referring Provider: Hart Robinsons, MD    Encounter Date: 11/24/2017  PT End of Session - 11/24/17 1551    Visit Number  5    Number of Visits  12    Date for PT Re-Evaluation  01/21/18    Authorization Type  11/10/17 to 02/02/18 (medicaid approval pending)    PT Start Time  1324    PT Stop Time  1611    PT Time Calculation (min)  48 min    Activity Tolerance  Patient tolerated treatment well;No increased pain    Behavior During Therapy  WFL for tasks assessed/performed       Past Medical History:  Diagnosis Date  . Allergic rhinitis   . Anemia, pernicious    b12 def.  . Anxiety   . Arthritis   . Biceps tendon tear    right  . Cervical spondylosis with radiculopathy    C2 -- C7  . Chronic fatigue   . Chronic pain    neck, back  . DDD (degenerative disc disease), cervical   . DDD (degenerative disc disease), lumbosacral   . Depression, major, recurrent ([place])   . Diverticulosis of colon   . Eczema   . Family history of adverse reaction to anesthesia    sister has problems waking up  . Family history of breast cancer   . Fibromyalgia 03/29/2007  . GERD (gastroesophageal reflux disease)   . Headache    constant headaches  . History of colonic diverticulitis 08/16/2010   w/ perforation (lower GI bleed)--- resolved without surgerical intervention  . 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 Hx of pyelonephritis 09/2005   due to UTI  . Hypertension   . IBS (irritable bowel syndrome)    internates between constpation/ diarrhea  . Lumbosacral spondylosis    L2-3, L4-5  . Mild obstructive sleep apnea    per study 06/ 2009 mild osa   AHI 13/hr---  recommendation given mouth appliance, loss wt., cpap  . Mixed hyperlipidemia   . OA (osteoarthritis)    right shoulder AC joint  . Pre-diabetes   . Right rotator cuff tear     Past Surgical History:  Procedure Laterality Date  . BREAST SURGERY     breast biopsy-benign  . COLONOSCOPY  last one 08-08-2009  . DILATATION & CURRETTAGE/HYSTEROSCOPY WITH RESECTOCOPE  02-03-2011   dr Dellis Filbert  [facility]   polypectomy  . RADIAL OPTIC NEUROTOMY     twice in lumbar area of back-every 6 months  . SHOULDER ARTHROSCOPY WITH ROTATOR CUFF REPAIR Right 08/13/2017   Procedure: RIGHT SHOULDER ARTHROSCOPY, DEBRIDEMENT, BICEPS TENOTOMY, ROTATOR CUFF REPAIR, DISTAL CLAVICLE RESECTION;  Surgeon: Sydnee Cabal, MD;  Location: [facility];  Service: Orthopedics;  Laterality: Right;  . TOTAL HIP ARTHROPLASTY Left 07/17/2015   Procedure: LEFT TOTAL HIP ARTHROPLASTY ANTERIOR APPROACH;  Surgeon: Paralee Cancel, MD;  Location: [facility];  Service: Orthopedics;  Laterality: Left;  . TOTAL HIP ARTHROPLASTY Right 08/21/2015   Procedure: RIGHT TOTAL HIP ARTHROPLASTY ANTERIOR APPROACH;  Surgeon: Paralee Cancel, MD;  [facility];  Service: Orthopedics;  Laterality: Right;  . TRANSTHORACIC ECHOCARDIOGRAM  12/30/2015   ef 40-10%, grade 1 diastolic dysfunction/  mild MR/ trivial TR    There were no vitals filed for this visit.  Subjective Assessment - 11/24/17 1615    Subjective  Pt reports no soreness following her last session. No pain currently.    Currently in Pain?  No/denies            [facility] Adult PT Treatment/Exercise - 11/24/17 0001      Shoulder Exercises: Supine   Other Supine Exercises  incline BUE flexion AAROM with 3# on SPC 2x20 reps; Bicep curls with 3# dumbbells 2x15 reps; incline chest press with 3# dumbbells 2x15 reps       Shoulder Exercises: Seated   Row  Both;15 reps;Limitations    Row Limitations  x2 sets, green TB    External Rotation  Strengthening;Both;10 reps     Theraband Level (Shoulder External Rotation)  Level 1 (Yellow)    External Rotation Limitations  therapist cues to keep elbows by side    Other Seated Exercises  UBE L5 x10 min, Prior to start of session    Other Seated Exercises  active shoulder ER without resistance x15 reps for proper technique      Shoulder Exercises: Sidelying   ABduction  Right;Strengthening;15 reps    ABduction Weight (lbs)  2    ABduction Limitations  x2 sets, initially tried with 3# but pt unable to complete properly      Shoulder Exercises: Standing   Other Standing Exercises  RUE ranger L28, flexion x20 reps (heavy focus on shoulder shrug)      Shoulder Exercises: Pulleys   Other Pulley Exercises  Rt shoulder IR x3 min             PT Education - 11/24/17 1550    Education provided  Yes    Education Details  implications for therex progressions    Person(s) Educated  Patient    Methods  Explanation;Verbal cues;Tactile cues    Comprehension  Verbalized understanding;Returned demonstration       PT Short Term Goals - 11/04/17 1628      PT SHORT TERM GOAL #1   Title  Pt will demo consistency and independence with her HEP to decrease inflammation and improve shoulder ROM.    Time  4    Period  Weeks    Status  Achieved        PT Long Term Goals - 11/24/17 1605      PT LONG TERM GOAL #1   Title  Pt will be independent in advanced HEP to allow for continuous progression towards her goals.     Time  12    Period  Weeks    Status  On-going      PT LONG TERM GOAL #2   Title  Pt will demonstrate atleast a 9 point improvement in her UEFI to reflect and improvement in her UE functional use.     Baseline  28/80    Time  12    Period  Weeks    Status  On-going      PT LONG TERM GOAL #3   Title  Pt will demo atleast 155 deg of active Rt shoulder flexion with minimal shoulder shrug to allow her to reach over head into her cabinet without difficulty.     Baseline  165 deg, pain free    Time  12     Status  Achieved      PT LONG TERM GOAL #4   Title  Pt will report atleast  50% decrease in her pain and stiffness from the start of PT to allow for improved quality of life and daily activity tolerance.     Baseline  30%    Time  12    Period  Weeks    Status  On-going      PT LONG TERM GOAL #5   Title  Pt will demo improved shoulder strength and endurance evident by her ability to maintain RUE elevated above her head for up to 10 min, and her report of being able to successfully wash her hair without difficulty.     Baseline  unable to maintain shoulder elevated above head for more than 2 min or so without fatigue/difficulty    Time  12    Period  Weeks    Status  Not Met      PT LONG TERM GOAL #6   Title  Pt will demo improved RUE strength to atleast 4/5 MMT which will allow her to lift small cans/objects overhead into her cabinet at home.     Time  12    Period  Weeks    Status  On-going            Plan - 11/24/17 1610    Clinical Impression Statement  Pt is making steady progress towards her goals, demonstrating improved Rt shoulder active flexion this session to atleast 165 deg, meeting her long term goal of 155 deg. Continued with progressions of shoulder strength with additional reps and resistance encouraged this visit. Pt was able to demonstrate improved scapula control and self-correct with verbal cuing only. Pt did not report any increase in pain, however there was noted fatigue and muscle shaking with multiple sets of exercise. Will continue with current POC.    Rehab Potential  Fair    Clinical Impairments Affecting Rehab Potential  (-) limited visits due to insurance     PT Frequency  Other (comment)    PT Duration  12 weeks    PT Treatment/Interventions  ADLs/Self Care Home Management;Cryotherapy;Electrical Stimulation;Moist Heat;Iontophoresis 62m/ml Dexamethasone;Therapeutic activities;Therapeutic exercise;Patient/family education;Orthotic  Fit/Training;Neuromuscular re-education;Manual techniques;Taping;Dry needling;Passive range of motion;Scar mobilization    PT Next Visit Plan  scapula control/mobility (mirror feedback if needed), serratus strengthening supine, Rt shoulder AROM progressions to 2# next or 1# in inclined position.    PT Home Exercise Plan  sidelying shoulder Abd AROM, supine shoulder flexion AROM, towel IR stretch, sidelying shoulder ER AROM    Consulted and Agree with Plan of Care  Patient       Patient will benefit from skilled therapeutic intervention in order to improve the following deficits and impairments:  Decreased activity tolerance, Impaired flexibility, Impaired UE functional use, Hypomobility, Decreased strength, Decreased range of motion, Decreased endurance, Decreased coordination, Decreased scar mobility, Increased muscle spasms, Postural dysfunction, Pain, Improper body mechanics  Visit Diagnosis: Stiffness of right shoulder, not elsewhere classified  Muscle weakness (generalized)  Acute pain of right shoulder  Abnormal posture     Problem List Patient Active Problem List   Diagnosis Date Noted  . S/P arthroscopy of right shoulder 08/13/2017  . Genetic testing 05/29/2017  . Family history of breast cancer   . Hyperlipidemia LDL goal <70 12/30/2015  . Cerebrovascular disease or lesion 12/30/2015  . Mild pulmonary hypertension ([place] 12/30/2015  . TIA (transient ischemic attack) 12/28/2015  . GERD (gastroesophageal reflux disease) 07/23/2015  . Chronic pain 07/23/2015  . Obese 07/19/2015  . S/P left THA, AA 07/17/2015  . 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 Insomnia 03/03/2011  . OSA (obstructive sleep apnea) 03/03/2011  . Depression, major, recurrent ([place]) 12/02/2010  . CONSTIPATION, SLOW TRANSIT 10/04/2010  . IRRITABLE BOWEL SYNDROME 08/14/2009  . CYSTITIS, CHRONIC INTERSTITIAL 06/27/2009  . UNSPECIFIED HYPOTHYROIDISM 05/16/2009  . LIPOMA OF OTHER SPECIFIED SITES  04/13/2009  . BRUXISM 04/13/2009  . SYNCOPE 03/13/2009  . Abnormal finding on MRI of brain 02/07/2009  . MUSCLE WEAKNESS (GENERALIZED) 12/20/2008  . UNSPECIFIED ALLERGIC ALVEOLITIS AND PNEUMONITIS 08/02/2008  . HIP PAIN, LEFT, CHRONIC 07/04/2008  . MALAISE AND FATIGUE 07/04/2008  . LOW BACK PAIN 05/26/2008  . PROTEINURIA 12/23/2007  . HYPERCALCEMIA 11/18/2007  . Essential hypertension 09/14/2007  . ANEMIA, B12 DEFICIENCY 05/18/2007  . 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 ALLERGIC RHINITIS 03/29/2007  . FIBROMYALGIA 03/29/2007    4:15 PM,11/24/17 Sherol Dade PT, DPT [facility] [address] [place], [place], [address] Phone: [phone]   Fax:  [phone]  Name: Kristin Pope MRN: [id number] Date of Birth: 1956-04-13

## 2017-11-24 NOTE — Patient Instructions (Signed)
    Rows with Theraband (green band)  Standing with feet hip width apart, stomach drawn in and glute muscles tight. Start with arms extended in front of you. Pull shoulder blades down and back and then pull elbows in towards body. Slowly return to starting position.    2 sets of 15 reps         Bilateral Shoulder External Rotation   (yellow band)  Standing with an upright posture, hold the middle of a piece of theraband with both hands (thumbs outwards). With your elbows at your side and arms bent at 90 degree angle, pull your hands outward, and squeeze your shoulder blades down and together.  2 sets, 10 reps.    [facility] [address], [place], [place] [address] Phone # [phone] Fax [phone]

## 2017-11-26 ENCOUNTER — Ambulatory Visit: Payer: Medicaid Other | Admitting: Physical Therapy

## 2017-11-26 ENCOUNTER — Encounter: Payer: Self-pay | Admitting: Physical Therapy

## 2017-11-26 DIAGNOSIS — M25511 Pain in right shoulder: Secondary | ICD-10-CM

## 2017-11-26 DIAGNOSIS — M6281 Muscle weakness (generalized): Secondary | ICD-10-CM

## 2017-11-26 DIAGNOSIS — R293 Abnormal posture: Secondary | ICD-10-CM

## 2017-11-26 DIAGNOSIS — M25611 Stiffness of right shoulder, not elsewhere classified: Secondary | ICD-10-CM | POA: Diagnosis not present

## 2017-11-26 NOTE — Therapy (Signed)
[facility] [address] [place], [place], [address] Phone: [phone]   Fax:  [phone]  Physical Therapy Treatment  Patient Details  Name: Kristin Pope MRN: [id number] Date of Birth: [birth year] Referring Provider: Hart Robinsons, MD    Encounter Date: 11/26/2017  PT End of Session - 11/26/17 1541    Visit Number  6    Number of Visits  12    Date for PT Re-Evaluation  01/21/18    Authorization Type  11/10/17 to 02/02/18 (medicaid approval pending)    Authorization - Visit Number  6    Authorization - Number of Visits  12    PT Start Time  3662    PT Stop Time  1622    PT Time Calculation (min)  49 min    Activity Tolerance  Patient tolerated treatment well;No increased pain    Behavior During Therapy  WFL for tasks assessed/performed       Past Medical History:  Diagnosis Date  . Allergic rhinitis   . Anemia, pernicious    b12 def.  . Anxiety   . Arthritis   . Biceps tendon tear    right  . Cervical spondylosis with radiculopathy    C2 -- C7  . Chronic fatigue   . Chronic pain    neck, back  . DDD (degenerative disc disease), cervical   . DDD (degenerative disc disease), lumbosacral   . Depression, major, recurrent ([place])   . Diverticulosis of colon   . Eczema   . Family history of adverse reaction to anesthesia    sister has problems waking up  . Family history of breast cancer   . Fibromyalgia 03/29/2007  . GERD (gastroesophageal reflux disease)   . Headache    constant headaches  . History of colonic diverticulitis 08/16/2010   w/ perforation (lower GI bleed)--- resolved without surgerical intervention  . 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 Hx of pyelonephritis 09/2005   due to UTI  . Hypertension   . IBS (irritable bowel syndrome)    internates between constpation/ diarrhea  . Lumbosacral spondylosis    L2-3, L4-5  . Mild obstructive sleep apnea    per study 06/ 2009 mild osa  AHI 13/hr---  recommendation given mouth appliance, loss wt., cpap  . Mixed hyperlipidemia   . OA (osteoarthritis)    right shoulder AC joint  . Pre-diabetes   . Right rotator cuff tear     Past Surgical History:  Procedure Laterality Date  . BREAST SURGERY     breast biopsy-benign  . COLONOSCOPY  last one 08-08-2009  . DILATATION & CURRETTAGE/HYSTEROSCOPY WITH RESECTOCOPE  02-03-2011   dr Dellis Filbert  [facility]   polypectomy  . RADIAL OPTIC NEUROTOMY     twice in lumbar area of back-every 6 months  . SHOULDER ARTHROSCOPY WITH ROTATOR CUFF REPAIR Right 08/13/2017   Procedure: RIGHT SHOULDER ARTHROSCOPY, DEBRIDEMENT, BICEPS TENOTOMY, ROTATOR CUFF REPAIR, DISTAL CLAVICLE RESECTION;  Surgeon: Sydnee Cabal, MD;  Location: [facility];  Service: Orthopedics;  Laterality: Right;  . TOTAL HIP ARTHROPLASTY Left 07/17/2015   Procedure: LEFT TOTAL HIP ARTHROPLASTY ANTERIOR APPROACH;  Surgeon: Paralee Cancel, MD;  [facility];  Service: Orthopedics;  Laterality: Left;  . TOTAL HIP ARTHROPLASTY Right 08/21/2015   Procedure: RIGHT TOTAL HIP ARTHROPLASTY ANTERIOR APPROACH;  Surgeon: Paralee Cancel, MD;  [facility];  Service: Orthopedics;  Laterality: Right;  . TRANSTHORACIC ECHOCARDIOGRAM  12/30/2015   ef 76-19%, grade 1 diastolic dysfunction/  mild MR/ trivial TR    There were no vitals filed for this visit.  Subjective Assessment - 11/26/17 1536    Subjective  Pt reports that she did not do her exercises at home like she should have. She is sore with certain arm movements and thinks it could be because of the 3# weight.     Currently in Pain?  No/denies only with certain movements           [facility] Adult PT Treatment/Exercise - 11/26/17 0001      Shoulder Exercises: Supine   Other Supine Exercises  incline BUE flexion with SPC and 2# x20 reps, incline serratus press with SPC/2# weight x15 reps      Other Supine Exercises  B horizontal abduction with red TB 2x10 reps       Shoulder Exercises: Seated   Other Seated Exercises  Nustep x10 min L4, increased to L5      Shoulder Exercises: ROM/Strengthening   Proximal Shoulder Strengthening, Supine  RUE only, holding red weighted ball making small circles x20 reps clockwise and counterclockwise x2 rounds each     Ball on Wall  RUE only, up/down x15 sec, Lt/Rt x15 sec (x2 rounds)    Rhythmic Stabilization, Supine  4x15 sec, RUE only, perturbation at elbow    Other ROM/Strengthening Exercises  incline plank (elbows extended) rocking Lt/Rt 2x10 reps  therapist cuing to encourage weight shift Rt       Rt shoulder adduction stretch against wall x2 reps, 10 sec hold (heavy cuing from therapist)      PT Education - 11/26/17 1540    Education provided  Yes    Education Details  expectations of soreness following sessions secondary to goals of increasing shoulder strength    Person(s) Educated  Patient    Methods  Explanation    Comprehension  Verbalized understanding       PT Short Term Goals - 11/04/17 1628      PT SHORT TERM GOAL #1   Title  Pt will demo consistency and independence with her HEP to decrease inflammation and improve shoulder ROM.    Time  4    Period  Weeks    Status  Achieved        PT Long Term Goals - 11/24/17 1605      PT LONG TERM GOAL #1   Title  Pt will be independent in advanced HEP to allow for continuous progression towards her goals.     Time  12    Period  Weeks    Status  On-going      PT LONG TERM GOAL #2   Title  Pt will demonstrate atleast a 9 point improvement in her UEFI to reflect and improvement in her UE functional use.     Baseline  28/80    Time  12    Period  Weeks    Status  On-going      PT LONG TERM GOAL #3   Title  Pt will demo atleast 155 deg of active Rt shoulder flexion with minimal shoulder shrug to allow her to reach over head into her cabinet without difficulty.      Baseline  165 deg, pain free    Time  12    Status  Achieved      PT LONG TERM GOAL #4   Title  Pt will  report atleast 50% decrease in her pain and stiffness from the start of PT to allow for improved quality of life and daily activity tolerance.     Baseline  30%    Time  12    Period  Weeks    Status  On-going      PT LONG TERM GOAL #5   Title  Pt will demo improved shoulder strength and endurance evident by her ability to maintain RUE elevated above her head for up to 10 min, and her report of being able to successfully wash her hair without difficulty.     Baseline  unable to maintain shoulder elevated above head for more than 2 min or so without fatigue/difficulty    Time  12    Period  Weeks    Status  Not Met      PT LONG TERM GOAL #6   Title  Pt will demo improved RUE strength to atleast 4/5 MMT which will allow her to lift small cans/objects overhead into her cabinet at home.     Time  12    Period  Weeks    Status  On-going            Plan - 11/26/17 1612    Clinical Impression Statement  Pt arrived noting increased soreness following her last session. Therapist was able to educate pt on expectations of soreness following strengthening therex and she verbalized understanding of this. Focused more on shoulder endurance and stability this session, noting muscle shaking and fatigue after multiple sets completed. Pt does have improved awareness of scapula position during activity, however this becomes increasingly difficult for her to correct when muscle fatigue sets in. Will plan to follow up with pt 1x/week for the remainder of her POC to encourage increased HEP adherence and pt independence moving forward.    Rehab Potential  Fair    Clinical Impairments Affecting Rehab Potential  (-) limited visits due to insurance     PT Frequency  Other (comment)    PT Duration  12 weeks    PT Treatment/Interventions  ADLs/Self Care Home Management;Cryotherapy;Electrical  Stimulation;Moist Heat;Iontophoresis 63m/ml Dexamethasone;Therapeutic activities;Therapeutic exercise;Patient/family education;Orthotic Fit/Training;Neuromuscular re-education;Manual techniques;Taping;Dry needling;Passive range of motion;Scar mobilization    PT Next Visit Plan  serratus strengthening supine and closed chain, Rt shoulder AROM progressions 3# in incline position, sidelying hip abduction with TB, rotation strengthening progression    PT Home Exercise Plan  sidelying shoulder Abd AROM, supine shoulder flexion AROM, towel IR stretch, sidelying shoulder ER AROM    Consulted and Agree with Plan of Care  Patient       Patient will benefit from skilled therapeutic intervention in order to improve the following deficits and impairments:  Decreased activity tolerance, Impaired flexibility, Impaired UE functional use, Hypomobility, Decreased strength, Decreased range of motion, Decreased endurance, Decreased coordination, Decreased scar mobility, Increased muscle spasms, Postural dysfunction, Pain, Improper body mechanics  Visit Diagnosis: Stiffness of right shoulder, not elsewhere classified  Muscle weakness (generalized)  Acute pain of right shoulder  Abnormal posture     Problem List Patient Active Problem List   Diagnosis Date Noted  . S/P arthroscopy of right shoulder 08/13/2017  . Genetic testing 05/29/2017  . Family history of breast cancer   . Hyperlipidemia LDL goal <70 12/30/2015  . Cerebrovascular disease or lesion 12/30/2015  . Mild pulmonary hypertension ([place] 12/30/2015  . TIA (transient ischemic attack) 12/28/2015  . GERD (gastroesophageal reflux disease) 07/23/2015  . Chronic pain 07/23/2015  .  Obese 07/19/2015  . S/P left THA, AA 07/17/2015  . 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 Insomnia 03/03/2011  . OSA (obstructive sleep apnea) 03/03/2011  . Depression, major, recurrent ([place]) 12/02/2010  . CONSTIPATION, SLOW TRANSIT 10/04/2010  . IRRITABLE BOWEL  SYNDROME 08/14/2009  . CYSTITIS, CHRONIC INTERSTITIAL 06/27/2009  . UNSPECIFIED HYPOTHYROIDISM 05/16/2009  . LIPOMA OF OTHER SPECIFIED SITES 04/13/2009  . BRUXISM 04/13/2009  . SYNCOPE 03/13/2009  . Abnormal finding on MRI of brain 02/07/2009  . MUSCLE WEAKNESS (GENERALIZED) 12/20/2008  . UNSPECIFIED ALLERGIC ALVEOLITIS AND PNEUMONITIS 08/02/2008  . HIP PAIN, LEFT, CHRONIC 07/04/2008  . MALAISE AND FATIGUE 07/04/2008  . LOW BACK PAIN 05/26/2008  . PROTEINURIA 12/23/2007  . HYPERCALCEMIA 11/18/2007  . Essential hypertension 09/14/2007  . ANEMIA, B12 DEFICIENCY 05/18/2007  . 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 ALLERGIC RHINITIS 03/29/2007  . FIBROMYALGIA 03/29/2007    4:17 PM,11/26/17 Sherol Dade PT, DPT [facility] [address] [place], [place], [address] Phone: [phone]   Fax:  [phone]  Name: KINLEE GARRISON MRN: [id number] Date of Birth: 1956/02/13

## 2017-12-01 ENCOUNTER — Encounter: Payer: Medicaid Other | Admitting: Physical Therapy

## 2017-12-02 ENCOUNTER — Encounter: Payer: Self-pay | Admitting: Physical Therapy

## 2017-12-02 ENCOUNTER — Ambulatory Visit: Payer: Medicaid Other | Attending: Specialist | Admitting: Physical Therapy

## 2017-12-02 DIAGNOSIS — M6281 Muscle weakness (generalized): Secondary | ICD-10-CM | POA: Insufficient documentation

## 2017-12-02 DIAGNOSIS — M25611 Stiffness of right shoulder, not elsewhere classified: Secondary | ICD-10-CM | POA: Insufficient documentation

## 2017-12-02 DIAGNOSIS — R293 Abnormal posture: Secondary | ICD-10-CM | POA: Diagnosis present

## 2017-12-02 DIAGNOSIS — M25511 Pain in right shoulder: Secondary | ICD-10-CM | POA: Diagnosis present

## 2017-12-02 NOTE — Patient Instructions (Addendum)
   Dowel Flexion  While seated, start with hands shoulder width apart holding dowel at waist level. Lift dowel overhead to a pain-free height. Return to resting position.   x10 reps, repeat 2x.       Rows with Scapular Retraction  Facing tubing with arms out as shown, pull elbows backwards squeezing shoulder blades together. Keep upright posture and shoulder back.    Using green band.  2x15 reps.        ELASTIC BAND SHOULDER  DIAGONAL - FLEXION ABDUCTION - Laying on your back.  Start by holding an elastic band down by your side to fixate it with your uninvolved arm. Next, using the involved arm, draw the other end of the band upwards and towards the opposite side as shown.       Use yellow band.  x10 reps, try to complete 3 sets    [facility] [address] [place], [place] [address] Phone # [phone] Fax [phone]

## 2017-12-02 NOTE — Therapy (Signed)
[facility] [address] [place], [place], [address] Phone: [phone]   Fax:  [phone]  Physical Therapy Treatment  Patient Details  Name: Kristin Pope MRN: [id number] Date of Birth: [birth year] Referring Provider: Hart Robinsons, MD    Encounter Date: 12/02/2017  PT End of Session - 12/02/17 1623    Visit Number  7    Number of Visits  12    Date for PT Re-Evaluation  01/21/18    Authorization Type  11/10/17 to 02/02/18 (medicaid approval pending)    Authorization - Visit Number  7    Authorization - Number of Visits  12    PT Start Time  6962    PT Stop Time  9528    PT Time Calculation (min)  40 min    Activity Tolerance  Patient tolerated treatment well;No increased pain    Behavior During Therapy  WFL for tasks assessed/performed       Past Medical History:  Diagnosis Date  . Allergic rhinitis   . Anemia, pernicious    b12 def.  . Anxiety   . Arthritis   . Biceps tendon tear    right  . Cervical spondylosis with radiculopathy    C2 -- C7  . Chronic fatigue   . Chronic pain    neck, back  . DDD (degenerative disc disease), cervical   . DDD (degenerative disc disease), lumbosacral   . Depression, major, recurrent ([place])   . Diverticulosis of colon   . Eczema   . Family history of adverse reaction to anesthesia    sister has problems waking up  . Family history of breast cancer   . Fibromyalgia 03/29/2007  . GERD (gastroesophageal reflux disease)   . Headache    constant headaches  . History of colonic diverticulitis 08/16/2010   w/ perforation (lower GI bleed)--- resolved without surgerical intervention  . 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 Hx of pyelonephritis 09/2005   due to UTI  . Hypertension   . IBS (irritable bowel syndrome)    internates between constpation/ diarrhea  . Lumbosacral spondylosis    L2-3, L4-5  . Mild obstructive sleep apnea    per study 06/ 2009 mild osa  AHI 13/hr---  recommendation given mouth appliance, loss wt., cpap  . Mixed hyperlipidemia   . OA (osteoarthritis)    right shoulder AC joint  . Pre-diabetes   . Right rotator cuff tear     Past Surgical History:  Procedure Laterality Date  . BREAST SURGERY     breast biopsy-benign  . COLONOSCOPY  last one 08-08-2009  . DILATATION & CURRETTAGE/HYSTEROSCOPY WITH RESECTOCOPE  02-03-2011   dr Dellis Filbert  [facility]   polypectomy  . RADIAL OPTIC NEUROTOMY     twice in lumbar area of back-every 6 months  . SHOULDER ARTHROSCOPY WITH ROTATOR CUFF REPAIR Right 08/13/2017   Procedure: RIGHT SHOULDER ARTHROSCOPY, DEBRIDEMENT, BICEPS TENOTOMY, ROTATOR CUFF REPAIR, DISTAL CLAVICLE RESECTION;  Surgeon: Sydnee Cabal, MD;  Location: [facility];  Service: Orthopedics;  Laterality: Right;  . TOTAL HIP ARTHROPLASTY Left 07/17/2015   Procedure: LEFT TOTAL HIP ARTHROPLASTY ANTERIOR APPROACH;  Surgeon: Paralee Cancel, MD;  Location: [facility];  Service: Orthopedics;  Laterality: Left;  . TOTAL HIP ARTHROPLASTY Right 08/21/2015   Procedure: RIGHT TOTAL HIP ARTHROPLASTY ANTERIOR APPROACH;  Surgeon: Paralee Cancel, MD;  [facility];  Service: Orthopedics;  Laterality: Right;  . TRANSTHORACIC ECHOCARDIOGRAM  12/30/2015   ef 62-83%, grade 1 diastolic dysfunction/  mild MR/ trivial TR    There were no vitals filed for this visit.  Subjective Assessment - 12/02/17 1535    Subjective  Pt reports that her shoulder is bothering her more today for some reason. She has been trying to complete some of her exercises at home, but is not necessarily following the handouts given to her. She has not been using ice or heat.     Currently in Pain?  Yes    Pain Score  6     Pain Location  Shoulder    Pain Orientation  Right    Pain Descriptors / Indicators  Sharp    Pain Type  Acute pain    Pain Radiating Towards  none     Pain Onset  1 to  4 weeks ago    Pain Frequency  Intermittent    Aggravating Factors   picking things up    Pain Relieving Factors  pain medication helps some    Effect of Pain on Daily Activities  moderate pain                 [facility] Adult PT Treatment/Exercise - 12/02/17 0001      Shoulder Exercises: Supine   Other Supine Exercises  Rt shoulder D2 flexion 2x10 reps with yellow TB       Shoulder Exercises: Seated   Row  Both;15 reps    Row Limitations  x2 sets, green TB    Flexion  Both;10 reps;AAROM    Flexion Limitations  x2 sets, therapist cuing to decrease shoulder shrug    Other Seated Exercises  Rt shoulder ER with pallof press, therapist adjusting tension with yellow TB      Shoulder Exercises: Standing   Other Standing Exercises  banded mountain climbers against wall x5 reps up/down (yellow TB)       Shoulder Exercises: ROM/Strengthening   Plank  30 seconds;3 reps;Other (comment);Limitations incline on forearms    Plank Limitations  therapist providing perturbations, using red physioball        Shoulder Exercises: Isometric Strengthening   External Rotation  5X5";Limitations    External Rotation Limitations  seated at 0 deg and 90 deg abduction              PT Education - 12/02/17 1619    Education provided  Yes    Education Details  importance of completing HEP as provided on handouts to ensure proper progressions are provided.    Person(s) Educated  Patient    Methods  Explanation;Verbal cues;Handout    Comprehension  Verbalized understanding;Returned demonstration       PT Short Term Goals - 11/04/17 1628      PT SHORT TERM GOAL #1   Title  Pt will demo consistency and independence with her HEP to decrease inflammation and improve shoulder ROM.    Time  4    Period  Weeks    Status  Achieved        PT Long Term Goals - 11/24/17 1605      PT LONG TERM GOAL #1   Title  Pt will be independent in advanced HEP to allow for continuous progression towards her  goals.     Time  12    Period  Weeks    Status  On-going      PT LONG TERM GOAL #2   Title  Pt  will demonstrate atleast a 9 point improvement in her UEFI to reflect and improvement in her UE functional use.     Baseline  28/80    Time  12    Period  Weeks    Status  On-going      PT LONG TERM GOAL #3   Title  Pt will demo atleast 155 deg of active Rt shoulder flexion with minimal shoulder shrug to allow her to reach over head into her cabinet without difficulty.     Baseline  165 deg, pain free    Time  12    Status  Achieved      PT LONG TERM GOAL #4   Title  Pt will report atleast 50% decrease in her pain and stiffness from the start of PT to allow for improved quality of life and daily activity tolerance.     Baseline  30%    Time  12    Period  Weeks    Status  On-going      PT LONG TERM GOAL #5   Title  Pt will demo improved shoulder strength and endurance evident by her ability to maintain RUE elevated above her head for up to 10 min, and her report of being able to successfully wash her hair without difficulty.     Baseline  unable to maintain shoulder elevated above head for more than 2 min or so without fatigue/difficulty    Time  12    Period  Weeks    Status  Not Met      PT LONG TERM GOAL #6   Title  Pt will demo improved RUE strength to atleast 4/5 MMT which will allow her to lift small cans/objects overhead into her cabinet at home.     Time  12    Period  Weeks    Status  On-going            Plan - 12/02/17 1623    Clinical Impression Statement  Pt continues to have increased Rt shoulder pain throughout the day. She has not been completing her HEP as instructed in prior sessions, and therapist discussed the importance of this to ensure her strength is progressed safely without over straining the rotator cuff. Pt's HEP was updated and reviewed with good understanding demonstrated. She does continue to require cues to decrease Rt scapula elevation, however  this is improving. Ended session without reports of increased shoulder pain. Will continue with current POC.    Rehab Potential  Fair    Clinical Impairments Affecting Rehab Potential  (-) limited visits due to insurance     PT Frequency  Other (comment)    PT Duration  12 weeks    PT Treatment/Interventions  ADLs/Self Care Home Management;Cryotherapy;Electrical Stimulation;Moist Heat;Iontophoresis 34m/ml Dexamethasone;Therapeutic activities;Therapeutic exercise;Patient/family education;Orthotic Fit/Training;Neuromuscular re-education;Manual techniques;Taping;Dry needling;Passive range of motion;Scar mobilization    PT Next Visit Plan  f/u on new HEP adherence; serratus strengthening supine and closed chain, Rt shoulder AROM progressions 3# in incline position, sidelying hip abduction with TB, rotation strengthening progression    PT Home Exercise Plan  seated shoulder AAROM flexion, supine D2 flexion with yellow TB, seated rows with green TB    Consulted and Agree with Plan of Care  Patient       Patient will benefit from skilled therapeutic intervention in order to improve the following deficits and impairments:  Decreased activity tolerance, Impaired flexibility, Impaired UE functional use, Hypomobility, Decreased strength, Decreased range of motion, Decreased  endurance, Decreased coordination, Decreased scar mobility, Increased muscle spasms, Postural dysfunction, Pain, Improper body mechanics  Visit Diagnosis: Muscle weakness (generalized)  Stiffness of right shoulder, not elsewhere classified  Acute pain of right shoulder  Abnormal posture     Problem List Patient Active Problem List   Diagnosis Date Noted  . S/P arthroscopy of right shoulder 08/13/2017  . Genetic testing 05/29/2017  . Family history of breast cancer   . Hyperlipidemia LDL goal <70 12/30/2015  . Cerebrovascular disease or lesion 12/30/2015  . Mild pulmonary hypertension ([place]) 12/30/2015  . TIA (transient  ischemic attack) 12/28/2015  . GERD (gastroesophageal reflux disease) 07/23/2015  . Chronic pain 07/23/2015  . Obese 07/19/2015  . S/P left THA, AA 07/17/2015  . 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 Insomnia 03/03/2011  . OSA (obstructive sleep apnea) 03/03/2011  . Depression, major, recurrent ([place]) 12/02/2010  . CONSTIPATION, SLOW TRANSIT 10/04/2010  . IRRITABLE BOWEL SYNDROME 08/14/2009  . CYSTITIS, CHRONIC INTERSTITIAL 06/27/2009  . UNSPECIFIED HYPOTHYROIDISM 05/16/2009  . LIPOMA OF OTHER SPECIFIED SITES 04/13/2009  . BRUXISM 04/13/2009  . SYNCOPE 03/13/2009  . Abnormal finding on MRI of brain 02/07/2009  . MUSCLE WEAKNESS (GENERALIZED) 12/20/2008  . UNSPECIFIED ALLERGIC ALVEOLITIS AND PNEUMONITIS 08/02/2008  . HIP PAIN, LEFT, CHRONIC 07/04/2008  . MALAISE AND FATIGUE 07/04/2008  . LOW BACK PAIN 05/26/2008  . PROTEINURIA 12/23/2007  . HYPERCALCEMIA 11/18/2007  . Essential hypertension 09/14/2007  . ANEMIA, B12 DEFICIENCY 05/18/2007  . 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 ALLERGIC RHINITIS 03/29/2007  . FIBROMYALGIA 03/29/2007    4:35 PM,12/02/17 Sherol Dade PT, DPT [facility] [address], [place], [place], [address] Phone: [phone]   Fax:  [phone]  Name: Kristin Pope MRN: [id number] Date of Birth: 10/12/1955

## 2017-12-03 ENCOUNTER — Encounter: Payer: Medicaid Other | Admitting: Physical Therapy

## 2017-12-09 DIAGNOSIS — N261 Atrophy of kidney (terminal): Secondary | ICD-10-CM | POA: Insufficient documentation

## 2017-12-09 HISTORY — DX: Atrophy of kidney (terminal): N26.1

## 2017-12-10 ENCOUNTER — Ambulatory Visit: Payer: Medicaid Other | Admitting: Physical Therapy

## 2017-12-10 ENCOUNTER — Encounter: Payer: Self-pay | Admitting: Physical Therapy

## 2017-12-10 DIAGNOSIS — M25511 Pain in right shoulder: Secondary | ICD-10-CM

## 2017-12-10 DIAGNOSIS — M6281 Muscle weakness (generalized): Secondary | ICD-10-CM

## 2017-12-10 DIAGNOSIS — R293 Abnormal posture: Secondary | ICD-10-CM

## 2017-12-10 DIAGNOSIS — M25611 Stiffness of right shoulder, not elsewhere classified: Secondary | ICD-10-CM

## 2017-12-10 NOTE — Therapy (Signed)
[facility] [address] [place], [place], [address] Phone: [phone]   Fax:  [phone]  Physical Therapy Treatment  Patient Details  Name: Kristin Pope MRN: [id number] Date of Birth: Jan 15, 1956 Referring Provider: Hart Robinsons, MD    Encounter Date: 12/10/2017  PT End of Session - 12/10/17 1604    Visit Number  8    Number of Visits  12    Date for PT Re-Evaluation  01/21/18    Authorization Type  11/10/17 to 02/02/18 (medicaid approval pending)    Authorization - Visit Number  8    Authorization - Number of Visits  12    PT Start Time  5053    PT Stop Time  1609    PT Time Calculation (min)  39 min    Activity Tolerance  Patient tolerated treatment well;No increased pain    Behavior During Therapy  WFL for tasks assessed/performed       Past Medical History:  Diagnosis Date  . Allergic rhinitis   . Anemia, pernicious    b12 def.  . Anxiety   . Arthritis   . Biceps tendon tear    right  . Cervical spondylosis with radiculopathy    C2 -- C7  . Chronic fatigue   . Chronic pain    neck, back  . DDD (degenerative disc disease), cervical   . DDD (degenerative disc disease), lumbosacral   . Depression, major, recurrent ([place])   . Diverticulosis of colon   . Eczema   . Family history of adverse reaction to anesthesia    sister has problems waking up  . Family history of breast cancer   . Fibromyalgia 03/29/2007  . GERD (gastroesophageal reflux disease)   . Headache    constant headaches  . History of colonic diverticulitis 08/16/2010   w/ perforation (lower GI bleed)--- resolved without surgerical intervention  . 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 Hx of pyelonephritis 09/2005   due to UTI  . Hypertension   . IBS (irritable bowel syndrome)    internates between constpation/ diarrhea  . Lumbosacral spondylosis    L2-3, L4-5  . Mild obstructive sleep apnea    per study 06/ 2009 mild osa  AHI 13/hr---  recommendation given mouth appliance, loss wt., cpap  . Mixed hyperlipidemia   . OA (osteoarthritis)    right shoulder AC joint  . Pre-diabetes   . Right rotator cuff tear     Past Surgical History:  Procedure Laterality Date  . BREAST SURGERY     breast biopsy-benign  . COLONOSCOPY  last one 08-08-2009  . DILATATION & CURRETTAGE/HYSTEROSCOPY WITH RESECTOCOPE  02-03-2011   dr Dellis Filbert  [facility]   polypectomy  . RADIAL OPTIC NEUROTOMY     twice in lumbar area of back-every 6 months  . SHOULDER ARTHROSCOPY WITH ROTATOR CUFF REPAIR Right 08/13/2017   Procedure: RIGHT SHOULDER ARTHROSCOPY, DEBRIDEMENT, BICEPS TENOTOMY, ROTATOR CUFF REPAIR, DISTAL CLAVICLE RESECTION;  Surgeon: Sydnee Cabal, MD;  Location: [facility];  Service: Orthopedics;  Laterality: Right;  . TOTAL HIP ARTHROPLASTY Left 07/17/2015   Procedure: LEFT TOTAL HIP ARTHROPLASTY ANTERIOR APPROACH;  Surgeon: Paralee Cancel, MD;  [facility];  Service: Orthopedics;  Laterality: Left;  . TOTAL HIP ARTHROPLASTY Right 08/21/2015   Procedure: RIGHT TOTAL HIP ARTHROPLASTY ANTERIOR APPROACH;  Surgeon: Paralee Cancel, MD;  [facility];  Service: Orthopedics;  Laterality: Right;  . TRANSTHORACIC ECHOCARDIOGRAM  12/30/2015   ef 48-25%, grade 1 diastolic dysfunction/  mild MR/ trivial TR    There were no vitals filed for this visit.  Subjective Assessment - 12/10/17 1531    Subjective  Pt reports that she feels she needs to work on lifting things more. She has been completing 2 of her exercises, but forgot about the one raising her arm over her head. Pt reports that she has no shoulder pain, but she does have increased Rt forearm soreness from her exercises at home.     Currently in Pain?  No/denies    Pain Onset  1 to 4 weeks ago                      [facility] Adult PT Treatment/Exercise - 12/10/17 0001      Shoulder  Exercises: Seated   Flexion  Both;10 reps;Weights    Flexion Weight (lbs)  1.5    Flexion Limitations  x2 sets, therapist cuing to decrease shoulder shrug     Abduction  Right;10 reps;Limitations    ABduction Limitations  x2 sets     Other Seated Exercises  Rt shoulder flexion in scapular plane 1# dumbbell 2x10 reps     Other Seated Exercises  Attempted serratus punches with green TB, pt unable to complete with or without resistance and mirror feedback; Completed posterior shoulder rolls x7 reps to improve scapula awareness and progressed to scapula 4 way clocks in superior/posterior/inferior/anterior direction x10 reps      Shoulder Exercises: Standing   External Rotation  Strengthening;Right;10 reps;Weights;Limitations    External Rotation Weight (lbs)  1    External Rotation Limitations  arm resting at 90 deg abduction, moderate cuing for proper technique     Internal Rotation  Right;10 reps;Theraband;Limitations    Theraband Level (Shoulder Internal Rotation)  Level 2 (Red)    Internal Rotation Limitations  x2 sets     Other Standing Exercises  Rt shoulder D2 extension 3x10 reps; Rt shoulder D1 flexion with yellow TB 2x15 reps       Shoulder Exercises: Power [employer]  15 reps;Limitations    Row Limitations  #25 x2 sets     Other Power [employer] Exercises  B bicep curls 3x5 reps with #25    Other Power Tower Exercises  B tricep extension 2x10 reps with #20             PT Education - 12/10/17 1622    Education provided  Yes    Education Details  importance of following handouts to ensure proper understanding and technique is used with HEP; implications for various exercises and carry over with functional tasks; technique with therex     Person(s) Educated  Patient    Methods  Explanation;Verbal cues;Tactile cues    Comprehension  Verbalized understanding       PT Short Term Goals - 11/04/17 1628      PT SHORT TERM GOAL #1   Title  Pt will demo consistency and independence  with her HEP to decrease inflammation and improve shoulder ROM.    Time  4    Period  Weeks    Status  Achieved        PT Long Term Goals - 11/24/17 1605      PT LONG TERM GOAL #1   Title  Pt will be independent in advanced HEP to allow for continuous progression towards her goals.  Time  12    Period  Weeks    Status  On-going      PT LONG TERM GOAL #2   Title  Pt will demonstrate atleast a 9 point improvement in her UEFI to reflect and improvement in her UE functional use.     Baseline  28/80    Time  12    Period  Weeks    Status  On-going      PT LONG TERM GOAL #3   Title  Pt will demo atleast 155 deg of active Rt shoulder flexion with minimal shoulder shrug to allow her to reach over head into her cabinet without difficulty.     Baseline  165 deg, pain free    Time  12    Status  Achieved      PT LONG TERM GOAL #4   Title  Pt will report atleast 50% decrease in her pain and stiffness from the start of PT to allow for improved quality of life and daily activity tolerance.     Baseline  30%    Time  12    Period  Weeks    Status  On-going      PT LONG TERM GOAL #5   Title  Pt will demo improved shoulder strength and endurance evident by her ability to maintain RUE elevated above her head for up to 10 min, and her report of being able to successfully wash her hair without difficulty.     Baseline  unable to maintain shoulder elevated above head for more than 2 min or so without fatigue/difficulty    Time  12    Period  Weeks    Status  Not Met      PT LONG TERM GOAL #6   Title  Pt will demo improved RUE strength to atleast 4/5 MMT which will allow her to lift small cans/objects overhead into her cabinet at home.     Time  12    Period  Weeks    Status  On-going            Plan - 12/10/17 1613    Clinical Impression Statement  Today's session continued with therex to promote Rt shoulder strength, endurance and functional use. Despite therapist efforts to  update pt's HEP last session, she reports only partially completing her HEP. She continues to demonstrate poor neuromuscular control of the scapula and weakness of the Rt rotator cuff, with noted shoulder shrug during active shoulder elevation. This tends to be more noticeable during repetitive movements. Ended session with further instruction from the therapist to complete HEP as instructed, pt verbalized understanding and agreement with this.     Rehab Potential  Fair    Clinical Impairments Affecting Rehab Potential  (-) limited visits due to insurance     PT Frequency  Other (comment)    PT Duration  12 weeks    PT Treatment/Interventions  ADLs/Self Care Home Management;Cryotherapy;Electrical Stimulation;Moist Heat;Iontophoresis 25m/ml Dexamethasone;Therapeutic activities;Therapeutic exercise;Patient/family education;Orthotic Fit/Training;Neuromuscular re-education;Manual techniques;Taping;Dry needling;Passive range of motion;Scar mobilization    PT Next Visit Plan  f/u on new HEP adherence; serratus strengthening supine and closed chain, Rt shoulder AROM progressions, sidelying shoulder abduction with TB, rotation strengthening progression    PT Home Exercise Plan  seated shoulder AAROM flexion, supine D2 flexion with yellow TB, seated rows with green TB    Consulted and Agree with Plan of Care  Patient       Patient will benefit from  skilled therapeutic intervention in order to improve the following deficits and impairments:  Decreased activity tolerance, Impaired flexibility, Impaired UE functional use, Hypomobility, Decreased strength, Decreased range of motion, Decreased endurance, Decreased coordination, Decreased scar mobility, Increased muscle spasms, Postural dysfunction, Pain, Improper body mechanics  Visit Diagnosis: Muscle weakness (generalized)  Stiffness of right shoulder, not elsewhere classified  Acute pain of right shoulder  Abnormal posture     Problem List Patient  Active Problem List   Diagnosis Date Noted  . S/P arthroscopy of right shoulder 08/13/2017  . Genetic testing 05/29/2017  . Family history of breast cancer   . Hyperlipidemia LDL goal <70 12/30/2015  . Cerebrovascular disease or lesion 12/30/2015  . Mild pulmonary hypertension ([place]) 12/30/2015  . TIA (transient ischemic attack) 12/28/2015  . GERD (gastroesophageal reflux disease) 07/23/2015  . Chronic pain 07/23/2015  . Obese 07/19/2015  . S/P left THA, AA 07/17/2015  . 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 Insomnia 03/03/2011  . OSA (obstructive sleep apnea) 03/03/2011  . Depression, major, recurrent ([place]) 12/02/2010  . CONSTIPATION, SLOW TRANSIT 10/04/2010  . IRRITABLE BOWEL SYNDROME 08/14/2009  . CYSTITIS, CHRONIC INTERSTITIAL 06/27/2009  . UNSPECIFIED HYPOTHYROIDISM 05/16/2009  . LIPOMA OF OTHER SPECIFIED SITES 04/13/2009  . BRUXISM 04/13/2009  . SYNCOPE 03/13/2009  . Abnormal finding on MRI of brain 02/07/2009  . MUSCLE WEAKNESS (GENERALIZED) 12/20/2008  . UNSPECIFIED ALLERGIC ALVEOLITIS AND PNEUMONITIS 08/02/2008  . HIP PAIN, LEFT, CHRONIC 07/04/2008  . MALAISE AND FATIGUE 07/04/2008  . LOW BACK PAIN 05/26/2008  . PROTEINURIA 12/23/2007  . HYPERCALCEMIA 11/18/2007  . Essential hypertension 09/14/2007  . ANEMIA, B12 DEFICIENCY 05/18/2007  . 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 ALLERGIC RHINITIS 03/29/2007  . FIBROMYALGIA 03/29/2007    4:24 PM,12/10/17 Sherol Dade PT, DPT [place] [facility] [address], [place], [place], [address] Phone: [phone]   Fax:  [phone]  Name: Kristin Pope MRN: [id number] Date of Birth: March 19, 1956

## 2017-12-16 ENCOUNTER — Ambulatory Visit: Payer: Medicaid Other

## 2017-12-16 ENCOUNTER — Encounter: Payer: Medicaid Other | Admitting: Physical Therapy

## 2017-12-16 DIAGNOSIS — R293 Abnormal posture: Secondary | ICD-10-CM

## 2017-12-16 DIAGNOSIS — M6281 Muscle weakness (generalized): Secondary | ICD-10-CM

## 2017-12-16 DIAGNOSIS — M25611 Stiffness of right shoulder, not elsewhere classified: Secondary | ICD-10-CM

## 2017-12-16 DIAGNOSIS — M25511 Pain in right shoulder: Secondary | ICD-10-CM

## 2017-12-16 NOTE — Therapy (Addendum)
[facility] [address] [place], [place], [address] Phone: [phone]   Fax:  [phone]  Physical Therapy Treatment  Patient Details  Name: Kristin Pope MRN: [id number] Date of Birth: 03-20-1956 Referring Provider: Hart Robinsons, MD    Encounter Date: 12/16/2017  PT End of Session - 12/16/17 1558    Visit Number  19 19 total    Number of Visits  --    Date for PT Re-Evaluation  01/21/18    Authorization Type  11/10/17 to 02/02/18 (medicaid approval pending)    Authorization Time Period  Medicaid approval:     Authorization - Visit Number  9    Authorization - Number of Visits  12    PT Start Time  5102    PT Stop Time  1600    PT Time Calculation (min)  45 min    Activity Tolerance  Patient tolerated treatment well;No increased pain    Behavior During Therapy  WFL for tasks assessed/performed       Past Medical History:  Diagnosis Date  . Allergic rhinitis   . Anemia, pernicious    b12 def.  . Anxiety   . Arthritis   . Biceps tendon tear    right  . Cervical spondylosis with radiculopathy    C2 -- C7  . Chronic fatigue   . Chronic pain    neck, back  . DDD (degenerative disc disease), cervical   . DDD (degenerative disc disease), lumbosacral   . Depression, major, recurrent ([place])   . Diverticulosis of colon   . Eczema   . Family history of adverse reaction to anesthesia    sister has problems waking up  . Family history of breast cancer   . Fibromyalgia 03/29/2007  . GERD (gastroesophageal reflux disease)   . Headache    constant headaches  . History of colonic diverticulitis 08/16/2010   w/ perforation (lower GI bleed)--- resolved without surgerical intervention  . 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 Hx of pyelonephritis 09/2005   due to UTI  . Hypertension   . IBS (irritable bowel syndrome)    internates  between constpation/ diarrhea  . Lumbosacral spondylosis    L2-3, L4-5  . Mild obstructive sleep apnea    per study 06/ 2009 mild osa  AHI 13/hr---  recommendation given mouth appliance, loss wt., cpap  . Mixed hyperlipidemia   . OA (osteoarthritis)    right shoulder AC joint  . Pre-diabetes   . Right rotator cuff tear     Past Surgical History:  Procedure Laterality Date  . BREAST SURGERY     breast biopsy-benign  . COLONOSCOPY  last one 08-08-2009  . DILATATION & CURRETTAGE/HYSTEROSCOPY WITH RESECTOCOPE  02-03-2011   dr Dellis Filbert  [facility]   polypectomy  . RADIAL OPTIC NEUROTOMY     twice in lumbar area of back-every 6 months  . SHOULDER ARTHROSCOPY WITH ROTATOR CUFF REPAIR Right 08/13/2017   Procedure: RIGHT SHOULDER ARTHROSCOPY, DEBRIDEMENT, BICEPS TENOTOMY, ROTATOR CUFF REPAIR, DISTAL CLAVICLE RESECTION;  Surgeon: Sydnee Cabal, MD;  Location: [place];  Service: Orthopedics;  Laterality: Right;  . TOTAL HIP ARTHROPLASTY Left 07/17/2015   Procedure: LEFT TOTAL HIP ARTHROPLASTY ANTERIOR APPROACH;  Surgeon: Paralee Cancel, MD;  Location[facility];  Service: Orthopedics;  Laterality: Left;  . TOTAL HIP ARTHROPLASTY Right 08/21/2015   Procedure: RIGHT TOTAL HIP ARTHROPLASTY ANTERIOR  APPROACH;  Surgeon: Paralee Cancel, MD;  Location[facility];  Service: Orthopedics;  Laterality: Right;  . TRANSTHORACIC ECHOCARDIOGRAM  12/30/2015   ef 31-54%, grade 1 diastolic dysfunction/  mild MR/ trivial TR    There were no vitals filed for this visit.  Subjective Assessment - 12/16/17 1521    Subjective  I really liked the weights that I did last time.      Currently in Pain?  Yes    Pain Score  2     Pain Location  Shoulder    Pain Orientation  Right    Pain Descriptors / Indicators  Sore    Pain Type  Acute pain    Pain Onset  1 to 4 weeks ago    Pain Frequency  Intermittent    Aggravating Factors   picking up heavy items    Pain Relieving Factors  pain medication, rest                       [facility] Adult PT Treatment/Exercise - 12/16/17 0001      Shoulder Exercises: Seated   Flexion  Both;10 reps;Weights semireclined    Flexion Weight (lbs)  1    Flexion Limitations  x2 sets, therapist cuing to decrease shoulder shrug and to keep arm straight    Abduction  Right;10 reps;Limitations    ABduction Limitations  x2 sets     Other Seated Exercises  Rt shoulder flexion in scapular plane 1# dumbbell 2x10 reps  semireclined      Shoulder Exercises: Sidelying   External Rotation  Strengthening;Right;Weights    External Rotation Weight (lbs)  2    External Rotation Limitations  towel underneath arm     ABduction  Strengthening;Right;20 reps    ABduction Weight (lbs)  1      Shoulder Exercises: Standing   Other Standing Exercises  wall push- ups 2x10    Other Standing Exercises  cone stack: to 2nd shelf and across body (horiztonal adduction 2x2 minutes      Shoulder Exercises: ROM/Strengthening   UBE (Upper Arm Bike)  Level 1 x 4 minutes (2/2)    Other ROM/Strengthening Exercises  NuStep: level 5x 10 mintues- arms and legs      Shoulder Exercises: Power [employer]  15 reps;Limitations    Row Limitations  #25 x2 sets     Other Power [employer] Exercises  B bicep curls 3x5 reps with #25    Other Power Tower Exercises  B tricep extension 2x10 reps with #20               PT Short Term Goals - 11/04/17 1628      PT SHORT TERM GOAL #1   Title  Pt will demo consistency and independence with her HEP to decrease inflammation and improve shoulder ROM.    Time  4    Period  Weeks    Status  Achieved        PT Long Term Goals - 11/24/17 1605      PT LONG TERM GOAL #1   Title  Pt will be independent in advanced HEP to allow for continuous progression towards her goals.     Time  12    Period  Weeks    Status  On-going      PT LONG TERM GOAL #2   Title  Pt will demonstrate atleast a 9 point improvement in her UEFI to reflect and  improvement in  her UE functional use.     Baseline  28/80    Time  12    Period  Weeks    Status  On-going      PT LONG TERM GOAL #3   Title  Pt will demo atleast 155 deg of active Rt shoulder flexion with minimal shoulder shrug to allow her to reach over head into her cabinet without difficulty.     Baseline  165 deg, pain free    Time  12    Status  Achieved      PT LONG TERM GOAL #4   Title  Pt will report atleast 50% decrease in her pain and stiffness from the start of PT to allow for improved quality of life and daily activity tolerance.     Baseline  30%    Time  12    Period  Weeks    Status  On-going      PT LONG TERM GOAL #5   Title  Pt will demo improved shoulder strength and endurance evident by her ability to maintain RUE elevated above her head for up to 10 min, and her report of being able to successfully wash her hair without difficulty.     Baseline  unable to maintain shoulder elevated above head for more than 2 min or so without fatigue/difficulty    Time  12    Period  Weeks    Status  Not Met      PT LONG TERM GOAL #6   Title  Pt will demo improved RUE strength to atleast 4/5 MMT which will allow her to lift small cans/objects overhead into her cabinet at home.     Time  12    Period  Weeks    Status  On-going            Plan - 12/16/17 1524    Clinical Impression Statement  PT focused on Rt shoulder strength with tactile and verbal cues to reduce compensation.  Pt reported last session that she was minimally compliant with HEP.  Pt with poor control of the Rt scapula due to Rt rotator cuff weakness.  Pt demonstrates fatigue at the end of session.  Pt will continue to benefit from skilled PT for Rt shoulder strength, scapular stability and control and flexibility to improve functional use.      Rehab Potential  Fair    PT Duration  12 weeks    PT Treatment/Interventions  ADLs/Self Care Home Management;Cryotherapy;Electrical Stimulation;Moist  Heat;Iontophoresis 65m/ml Dexamethasone;Therapeutic activities;Therapeutic exercise;Patient/family education;Orthotic Fit/Training;Neuromuscular re-education;Manual techniques;Taping;Dry needling;Passive range of motion;Scar mobilization    PT Next Visit Plan  Rt shoulder strength with emphasis on scapular stabilization/depression, closed chain    Recommended Other Services  all certifications have been signed.    Consulted and Agree with Plan of Care  Patient       Patient will benefit from skilled therapeutic intervention in order to improve the following deficits and impairments:  Decreased activity tolerance, Impaired flexibility, Impaired UE functional use, Hypomobility, Decreased strength, Decreased range of motion, Decreased endurance, Decreased coordination, Decreased scar mobility, Increased muscle spasms, Postural dysfunction, Pain, Improper body mechanics  Visit Diagnosis: Muscle weakness (generalized)  Stiffness of right shoulder, not elsewhere classified  Acute pain of right shoulder  Abnormal posture     Problem List Patient Active Problem List   Diagnosis Date Noted  . S/P arthroscopy of right shoulder 08/13/2017  . Genetic testing 05/29/2017  . Family history of breast cancer   .  Hyperlipidemia LDL goal <70 12/30/2015  . Cerebrovascular disease or lesion 12/30/2015  . Mild pulmonary hypertension ([place]) 12/30/2015  . TIA (transient ischemic attack) 12/28/2015  . GERD (gastroesophageal reflux disease) 07/23/2015  . Chronic pain 07/23/2015  . Obese 07/19/2015  . S/P left THA, AA 07/17/2015  . 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 Insomnia 03/03/2011  . OSA (obstructive sleep apnea) 03/03/2011  . Depression, major, recurrent ([place]) 12/02/2010  . CONSTIPATION, SLOW TRANSIT 10/04/2010  . IRRITABLE BOWEL SYNDROME 08/14/2009  . CYSTITIS, CHRONIC INTERSTITIAL 06/27/2009  . UNSPECIFIED HYPOTHYROIDISM 05/16/2009  . LIPOMA OF OTHER SPECIFIED SITES 04/13/2009  .  BRUXISM 04/13/2009  . SYNCOPE 03/13/2009  . Abnormal finding on MRI of brain 02/07/2009  . MUSCLE WEAKNESS (GENERALIZED) 12/20/2008  . UNSPECIFIED ALLERGIC ALVEOLITIS AND PNEUMONITIS 08/02/2008  . HIP PAIN, LEFT, CHRONIC 07/04/2008  . MALAISE AND FATIGUE 07/04/2008  . LOW BACK PAIN 05/26/2008  . PROTEINURIA 12/23/2007  . HYPERCALCEMIA 11/18/2007  . Essential hypertension 09/14/2007  . ANEMIA, B12 DEFICIENCY 05/18/2007  . 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 ALLERGIC RHINITIS 03/29/2007  . FIBROMYALGIA 03/29/2007    Sigurd Sos, PT 12/16/17 4:09 PM  [facility] [address], [place], [place], [address] Phone: [phone]   Fax:  [phone]  Name: Kristin Pope MRN: [id number] Date of Birth: [birth year]

## 2017-12-22 DIAGNOSIS — N390 Urinary tract infection, site not specified: Secondary | ICD-10-CM | POA: Insufficient documentation

## 2017-12-22 HISTORY — DX: Urinary tract infection, site not specified: N39.0

## 2017-12-23 ENCOUNTER — Ambulatory Visit: Payer: Medicaid Other | Admitting: Physical Therapy

## 2017-12-23 ENCOUNTER — Encounter: Payer: Self-pay | Admitting: Physical Therapy

## 2017-12-23 DIAGNOSIS — M25611 Stiffness of right shoulder, not elsewhere classified: Secondary | ICD-10-CM

## 2017-12-23 DIAGNOSIS — M6281 Muscle weakness (generalized): Secondary | ICD-10-CM

## 2017-12-23 DIAGNOSIS — M25511 Pain in right shoulder: Secondary | ICD-10-CM

## 2017-12-23 DIAGNOSIS — R293 Abnormal posture: Secondary | ICD-10-CM

## 2017-12-23 NOTE — Therapy (Signed)
[facility] [address], [place], [place], [address] Phone: [phone]   Fax:  [phone]  Physical Therapy Treatment  Patient Details  Name: Kristin Pope MRN: [id number] Date of Birth: 05/13/1956 Referring Provider: Hart Robinsons, MD    Encounter Date: 12/23/2017  PT End of Session - 12/23/17 1605    Visit Number  20 19 total    Date for PT Re-Evaluation  01/21/18    Authorization Type  11/10/17 to 02/02/18 (medicaid approval pending)    Authorization Time Period  Medicaid approval:     Authorization - Visit Number  10    Authorization - Number of Visits  12    PT Start Time  1017    PT Stop Time  5102    PT Time Calculation (min)  44 min    Activity Tolerance  Patient tolerated treatment well;No increased pain    Behavior During Therapy  WFL for tasks assessed/performed       Past Medical History:  Diagnosis Date  . Allergic rhinitis   . Anemia, pernicious    b12 def.  . Anxiety   . Arthritis   . Biceps tendon tear    right  . Cervical spondylosis with radiculopathy    C2 -- C7  . Chronic fatigue   . Chronic pain    neck, back  . DDD (degenerative disc disease), cervical   . DDD (degenerative disc disease), lumbosacral   . Depression, major, recurrent ([place])   . Diverticulosis of colon   . Eczema   . Family history of adverse reaction to anesthesia    sister has problems waking up  . Family history of breast cancer   . Fibromyalgia 03/29/2007  . GERD (gastroesophageal reflux disease)   . Headache    constant headaches  . History of colonic diverticulitis 08/16/2010   w/ perforation (lower GI bleed)--- resolved without surgerical intervention  . 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 Hx of pyelonephritis 09/2005   due to UTI  . Hypertension   . IBS (irritable bowel syndrome)    internates between constpation/  diarrhea  . Lumbosacral spondylosis    L2-3, L4-5  . Mild obstructive sleep apnea    per study 06/ 2009 mild osa  AHI 13/hr---  recommendation given mouth appliance, loss wt., cpap  . Mixed hyperlipidemia   . OA (osteoarthritis)    right shoulder AC joint  . Pre-diabetes   . Right rotator cuff tear     Past Surgical History:  Procedure Laterality Date  . BREAST SURGERY     breast biopsy-benign  . COLONOSCOPY  last one 08-08-2009  . DILATATION & CURRETTAGE/HYSTEROSCOPY WITH RESECTOCOPE  02-03-2011   dr Dellis Filbert  [facility]   polypectomy  . RADIAL OPTIC NEUROTOMY     twice in lumbar area of back-every 6 months  . SHOULDER ARTHROSCOPY WITH ROTATOR CUFF REPAIR Right 08/13/2017   Procedure: RIGHT SHOULDER ARTHROSCOPY, DEBRIDEMENT, BICEPS TENOTOMY, ROTATOR CUFF REPAIR, DISTAL CLAVICLE RESECTION;  Surgeon: Sydnee Cabal, MD;  Location: [place];  Service: Orthopedics;  Laterality: Right;  . TOTAL HIP ARTHROPLASTY Left 07/17/2015   Procedure: LEFT TOTAL HIP ARTHROPLASTY ANTERIOR APPROACH;  Surgeon: Paralee Cancel, MD;  [facility];  Service: Orthopedics;  Laterality: Left;  . TOTAL HIP ARTHROPLASTY Right 08/21/2015   Procedure: RIGHT TOTAL HIP ARTHROPLASTY ANTERIOR APPROACH;  Surgeon: Paralee Cancel, MD;  [facility];  Service: Orthopedics;  Laterality: Right;  . TRANSTHORACIC ECHOCARDIOGRAM  12/30/2015   ef 17-79%, grade 1 diastolic dysfunction/  mild MR/ trivial TR    There were no vitals filed for this visit.  Subjective Assessment - 12/23/17 1532    Subjective  didn't take a pain pill today so she's a little worried about how much pain she will have.  weather seems to make the shoulder more sore.    Patient Stated Goals  improve shoulder ROM, strength     Currently in Pain?  Yes    Pain Score  5     Pain Location  Shoulder    Pain Orientation  Right    Pain Type  Acute pain    Pain Onset  1 to 4 weeks ago    Pain Frequency  Intermittent    Aggravating Factors    picking up heavy items, weather    Pain Relieving Factors  pain medication, rest                No data recorded       [facility] Adult PT Treatment/Exercise - 12/23/17 1536      Shoulder Exercises: Sidelying   External Rotation  Strengthening;Right;Weights;20 reps    External Rotation Weight (lbs)  2    External Rotation Limitations  towel underneath arm     ABduction  Strengthening;Right;20 reps    ABduction Weight (lbs)  1      Shoulder Exercises: Standing   Flexion  Right;10 reps;Weights    Shoulder Flexion Weight (lbs)  1 counter to cabinet (1st shelf height)    ABduction  Right;10 reps;Weights counter to 1st shelf height; scaption    Shoulder ABduction Weight (lbs)  1    Retraction  Both;15 reps;Theraband    Theraband Level (Shoulder Retraction)  Level 3 (Green)    Other Standing Exercises  cone stack: to 2nd shelf and across body (horiztonal adduction 2x2 minutes      Shoulder Exercises: ROM/Strengthening   UBE (Upper Arm Bike)  Level 2 x 4 minutes (2/2)    Other ROM/Strengthening Exercises  NuStep: level 5x 10 mintues- arms and legs      Shoulder Exercises: Power [employer] operational [employer] Limitations  #25 x2 sets     Other Power [employer] Exercises  B bicep curls 3x5 reps with #25    Other Power Tower Exercises  B tricep extension 2x10 reps with #30               PT Short Term Goals - 11/04/17 1628      PT SHORT TERM GOAL #1   Title  Pt will demo consistency and independence with her HEP to decrease inflammation and improve shoulder ROM.    Time  4    Period  Weeks    Status  Achieved        PT Long Term Goals - 11/24/17 1605      PT LONG TERM GOAL #1   Title  Pt will be independent in advanced HEP to allow for continuous progression towards her goals.     Time  12    Period  Weeks    Status  On-going      PT LONG TERM GOAL #2   Title  Pt will demonstrate atleast a 9 point improvement in her UEFI to reflect and improvement in  her UE functional use.     Baseline  28/80  Time  12    Period  Weeks    Status  On-going      PT LONG TERM GOAL #3   Title  Pt will demo atleast 155 deg of active Rt shoulder flexion with minimal shoulder shrug to allow her to reach over head into her cabinet without difficulty.     Baseline  165 deg, pain free    Time  12    Status  Achieved      PT LONG TERM GOAL #4   Title  Pt will report atleast 50% decrease in her pain and stiffness from the start of PT to allow for improved quality of life and daily activity tolerance.     Baseline  30%    Time  12    Period  Weeks    Status  On-going      PT LONG TERM GOAL #5   Title  Pt will demo improved shoulder strength and endurance evident by her ability to maintain RUE elevated above her head for up to 10 min, and her report of being able to successfully wash her hair without difficulty.     Baseline  unable to maintain shoulder elevated above head for more than 2 min or so without fatigue/difficulty    Time  12    Period  Weeks    Status  Not Met      PT LONG TERM GOAL #6   Title  Pt will demo improved RUE strength to atleast 4/5 MMT which will allow her to lift small cans/objects overhead into her cabinet at home.     Time  12    Period  Weeks    Status  On-going            Plan - 12/23/17 1607    Clinical Impression Statement  Pt tolerated session well and reports no significant pain increase with exercises despite forgetting to take pain medication.  Continues to need cues to decrease shoulder shrug but able to correct.  Will cotninue to benefit from PT to maximize functional strength.    Rehab Potential  Fair    PT Duration  12 weeks    PT Treatment/Interventions  ADLs/Self Care Home Management;Cryotherapy;Electrical Stimulation;Moist Heat;Iontophoresis 15m/ml Dexamethasone;Therapeutic activities;Therapeutic exercise;Patient/family education;Orthotic Fit/Training;Neuromuscular re-education;Manual techniques;Taping;Dry  needling;Passive range of motion;Scar mobilization    PT Next Visit Plan  Rt shoulder strength with emphasis on scapular stabilization/depression, closed chain    Consulted and Agree with Plan of Care  Patient       Patient will benefit from skilled therapeutic intervention in order to improve the following deficits and impairments:  Decreased activity tolerance, Impaired flexibility, Impaired UE functional use, Hypomobility, Decreased strength, Decreased range of motion, Decreased endurance, Decreased coordination, Decreased scar mobility, Increased muscle spasms, Postural dysfunction, Pain, Improper body mechanics  Visit Diagnosis: Muscle weakness (generalized)  Stiffness of right shoulder, not elsewhere classified  Acute pain of right shoulder  Abnormal posture     Problem List Patient Active Problem List   Diagnosis Date Noted  . S/P arthroscopy of right shoulder 08/13/2017  . Genetic testing 05/29/2017  . Family history of breast cancer   . Hyperlipidemia LDL goal <70 12/30/2015  . Cerebrovascular disease or lesion 12/30/2015  . Mild pulmonary hypertension ([place] 12/30/2015  . TIA (transient ischemic attack) 12/28/2015  . GERD (gastroesophageal reflux disease) 07/23/2015  . Chronic pain 07/23/2015  . Obese 07/19/2015  . S/P left THA, AA 07/17/2015  . 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  Insomnia 03/03/2011  . OSA (obstructive sleep apnea) 03/03/2011  . Depression, major, recurrent ([place]) 12/02/2010  . CONSTIPATION, SLOW TRANSIT 10/04/2010  . IRRITABLE BOWEL SYNDROME 08/14/2009  . CYSTITIS, CHRONIC INTERSTITIAL 06/27/2009  . UNSPECIFIED HYPOTHYROIDISM 05/16/2009  . LIPOMA OF OTHER SPECIFIED SITES 04/13/2009  . BRUXISM 04/13/2009  . SYNCOPE 03/13/2009  . Abnormal finding on MRI of brain 02/07/2009  . MUSCLE WEAKNESS (GENERALIZED) 12/20/2008  . UNSPECIFIED ALLERGIC ALVEOLITIS AND PNEUMONITIS 08/02/2008  . HIP PAIN, LEFT, CHRONIC 07/04/2008  . MALAISE AND FATIGUE  07/04/2008  . LOW BACK PAIN 05/26/2008  . PROTEINURIA 12/23/2007  . HYPERCALCEMIA 11/18/2007  . Essential hypertension 09/14/2007  . ANEMIA, B12 DEFICIENCY 05/18/2007  . 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 ALLERGIC RHINITIS 03/29/2007  . FIBROMYALGIA 03/29/2007     Laureen Abrahams, PT, DPT 12/23/17 4:20 PM    [facility] [address] [place], [place], [address] Phone: [phone]   Fax:  [phone]  Name: Kristin Pope MRN: [id number] Date of Birth: [birth year]

## 2017-12-29 ENCOUNTER — Encounter: Payer: Self-pay | Admitting: Physical Therapy

## 2017-12-29 ENCOUNTER — Ambulatory Visit: Payer: Medicaid Other | Attending: Specialist | Admitting: Physical Therapy

## 2017-12-29 DIAGNOSIS — M25511 Pain in right shoulder: Secondary | ICD-10-CM | POA: Diagnosis present

## 2017-12-29 DIAGNOSIS — M25611 Stiffness of right shoulder, not elsewhere classified: Secondary | ICD-10-CM | POA: Diagnosis present

## 2017-12-29 DIAGNOSIS — R293 Abnormal posture: Secondary | ICD-10-CM | POA: Insufficient documentation

## 2017-12-29 DIAGNOSIS — M6281 Muscle weakness (generalized): Secondary | ICD-10-CM | POA: Diagnosis not present

## 2017-12-29 NOTE — Therapy (Signed)
[facility] [address] [place], [place], [address] Phone: [phone]   Fax:  [phone]  Physical Therapy Treatment  Patient Details  Name: Kristin Pope MRN: [id number] Date of Birth: November 23, 1955 Referring Provider: Hart Robinsons, MD    Encounter Date: 12/29/2017  PT End of Session - 12/29/17 1519    Visit Number  21    Date for PT Re-Evaluation  01/21/18    Authorization Type  11/10/17 to 02/02/18 (medicaid approval pending)    Authorization Time Period  Medicaid approval:     Authorization - Visit Number  11    Authorization - Number of Visits  12    PT Start Time  9798    PT Stop Time  1528    PT Time Calculation (min)  43 min    Activity Tolerance  Patient tolerated treatment well;No increased pain    Behavior During Therapy  WFL for tasks assessed/performed       Past Medical History:  Diagnosis Date  . Allergic rhinitis   . Anemia, pernicious    b12 def.  . Anxiety   . Arthritis   . Biceps tendon tear    right  . Cervical spondylosis with radiculopathy    C2 -- C7  . Chronic fatigue   . Chronic pain    neck, back  . DDD (degenerative disc disease), cervical   . DDD (degenerative disc disease), lumbosacral   . Depression, major, recurrent ([place])   . Diverticulosis of colon   . Eczema   . Family history of adverse reaction to anesthesia    sister has problems waking up  . Family history of breast cancer   . Fibromyalgia 03/29/2007  . GERD (gastroesophageal reflux disease)   . Headache    constant headaches  . History of colonic diverticulitis 08/16/2010   w/ perforation (lower GI bleed)--- resolved without surgerical intervention  . 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 Hx of pyelonephritis 09/2005   due to UTI  . Hypertension   . IBS (irritable bowel syndrome)    internates between constpation/ diarrhea  .  Lumbosacral spondylosis    L2-3, L4-5  . Mild obstructive sleep apnea    per study 06/ 2009 mild osa  AHI 13/hr---  recommendation given mouth appliance, loss wt., cpap  . Mixed hyperlipidemia   . OA (osteoarthritis)    right shoulder AC joint  . Pre-diabetes   . Right rotator cuff tear     Past Surgical History:  Procedure Laterality Date  . BREAST SURGERY     breast biopsy-benign  . COLONOSCOPY  last one 08-08-2009  . DILATATION & CURRETTAGE/HYSTEROSCOPY WITH RESECTOCOPE  02-03-2011   dr Dellis Filbert  [facility]   polypectomy  . RADIAL OPTIC NEUROTOMY     twice in lumbar area of back-every 6 months  . SHOULDER ARTHROSCOPY WITH ROTATOR CUFF REPAIR Right 08/13/2017   Procedure: RIGHT SHOULDER ARTHROSCOPY, DEBRIDEMENT, BICEPS TENOTOMY, ROTATOR CUFF REPAIR, DISTAL CLAVICLE RESECTION;  Surgeon: Sydnee Cabal, MD;  Location: [place];  Service: Orthopedics;  Laterality: Right;  . TOTAL HIP ARTHROPLASTY Left 07/17/2015   Procedure: LEFT TOTAL HIP ARTHROPLASTY ANTERIOR APPROACH;  Surgeon: Paralee Cancel, MD;  Location[facility];  Service: Orthopedics;  Laterality: Left;  . TOTAL HIP ARTHROPLASTY Right 08/21/2015   Procedure: RIGHT TOTAL HIP ARTHROPLASTY ANTERIOR APPROACH;  Surgeon: Paralee Cancel, MD;  Location[facility];  Service: Orthopedics;  Laterality: Right;  . TRANSTHORACIC ECHOCARDIOGRAM  12/30/2015   ef 73-22%, grade 1 diastolic dysfunction/  mild MR/ trivial TR    There were no vitals filed for this visit.  Subjective Assessment - 12/29/17 1444    Subjective  a little sore today due to cold weather    Patient Stated Goals  improve shoulder ROM, strength     Currently in Pain?  Yes    Pain Score  6     Pain Location  Shoulder    Pain Orientation  Right    Pain Descriptors / Indicators  Sore;Sharp    Pain Type  Surgical pain;Acute pain    Pain Frequency  Constant    Aggravating Factors   picking up heavy items, weather    Pain Relieving Factors  pain medication, rest                        [facility] Adult PT Treatment/Exercise - 12/29/17 1447      Shoulder Exercises: Supine   Protraction  Right;20 reps red medicine ball    Flexion  Right;20 reps;Weights    Shoulder Flexion Weight (lbs)  1      Shoulder Exercises: Prone   Retraction  Right;20 reps;Weights    Retraction Weight (lbs)  3    Extension  Right;20 reps;Weights    Extension Weight (lbs)  1    Horizontal ABduction 1  Right;20 reps;Weights    Horizontal ABduction 1 Weight (lbs)  1      Shoulder Exercises: Sidelying   External Rotation  Strengthening;Right;Weights;20 reps    External Rotation Weight (lbs)  2    ABduction  Strengthening;Right;20 reps    ABduction Weight (lbs)  1      Shoulder Exercises: Standing   Flexion  Right;Weights;20 reps    Shoulder Flexion Weight (lbs)  1 counter to cabinet (1st shelf height)    ABduction  Right;10 reps;Weights counter to 1st shelf height; scaption    Shoulder ABduction Weight (lbs)  1    Retraction  Both;20 reps;Theraband    Theraband Level (Shoulder Retraction)  Level 3 (Green)    Other Standing Exercises  cone stack: to 2nd shelf and across body (horiztonal adduction 2x2 minutes      Shoulder Exercises: ROM/Strengthening   UBE (Upper Arm Bike)  Level 2 x 6 minutes (2/2)    Proximal Shoulder Strengthening, Supine  A-Z with red medicine ball    Other ROM/Strengthening Exercises  NuStep: level 6x 10 mintues- arms and legs               PT Short Term Goals - 11/04/17 1628      PT SHORT TERM GOAL #1   Title  Pt will demo consistency and independence with her HEP to decrease inflammation and improve shoulder ROM.    Time  4    Period  Weeks    Status  Achieved        PT Long Term Goals - 11/24/17 1605      PT LONG TERM GOAL #1   Title  Pt will be independent in advanced HEP to allow for continuous progression towards her goals.     Time  12    Period  Weeks    Status  On-going      PT LONG TERM GOAL #2   Title   Pt will demonstrate atleast a 9 point improvement in her UEFI to reflect and improvement in  her UE functional use.     Baseline  28/80    Time  12    Period  Weeks    Status  On-going      PT LONG TERM GOAL #3   Title  Pt will demo atleast 155 deg of active Rt shoulder flexion with minimal shoulder shrug to allow her to reach over head into her cabinet without difficulty.     Baseline  165 deg, pain free    Time  12    Status  Achieved      PT LONG TERM GOAL #4   Title  Pt will report atleast 50% decrease in her pain and stiffness from the start of PT to allow for improved quality of life and daily activity tolerance.     Baseline  30%    Time  12    Period  Weeks    Status  On-going      PT LONG TERM GOAL #5   Title  Pt will demo improved shoulder strength and endurance evident by her ability to maintain RUE elevated above her head for up to 10 min, and her report of being able to successfully wash her hair without difficulty.     Baseline  unable to maintain shoulder elevated above head for more than 2 min or so without fatigue/difficulty    Time  12    Period  Weeks    Status  Not Met      PT LONG TERM GOAL #6   Title  Pt will demo improved RUE strength to atleast 4/5 MMT which will allow her to lift small cans/objects overhead into her cabinet at home.     Time  12    Period  Weeks    Status  On-going            Plan - 12/29/17 1520    Clinical Impression Statement  Pt tolerated session well today with noticable fatigue today at end of standing exercises.  Pt progressing well with PT, will plan to reassess and request additional visits next session.    Rehab Potential  Fair    PT Duration  12 weeks    PT Treatment/Interventions  ADLs/Self Care Home Management;Cryotherapy;Electrical Stimulation;Moist Heat;Iontophoresis 103m/ml Dexamethasone;Therapeutic activities;Therapeutic exercise;Patient/family education;Orthotic Fit/Training;Neuromuscular re-education;Manual  techniques;Taping;Dry needling;Passive range of motion;Scar mobilization    PT Next Visit Plan  Rt shoulder strength with emphasis on scapular stabilization/depression, closed chain; needs recert to Medicaid    Consulted and Agree with Plan of Care  Patient       Patient will benefit from skilled therapeutic intervention in order to improve the following deficits and impairments:  Decreased activity tolerance, Impaired flexibility, Impaired UE functional use, Hypomobility, Decreased strength, Decreased range of motion, Decreased endurance, Decreased coordination, Decreased scar mobility, Increased muscle spasms, Postural dysfunction, Pain, Improper body mechanics  Visit Diagnosis: Muscle weakness (generalized)  Stiffness of right shoulder, not elsewhere classified  Acute pain of right shoulder  Abnormal posture     Problem List Patient Active Problem List   Diagnosis Date Noted  . S/P arthroscopy of right shoulder 08/13/2017  . Genetic testing 05/29/2017  . Family history of breast cancer   . Hyperlipidemia LDL goal <70 12/30/2015  . Cerebrovascular disease or lesion 12/30/2015  . Mild pulmonary hypertension ([place] 12/30/2015  . TIA (transient ischemic attack) 12/28/2015  . GERD (gastroesophageal reflux disease) 07/23/2015  . Chronic pain 07/23/2015  . Obese 07/19/2015  . S/P left THA, AA 07/17/2015  .  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 Insomnia 03/03/2011  . OSA (obstructive sleep apnea) 03/03/2011  . Depression, major, recurrent ([place]) 12/02/2010  . CONSTIPATION, SLOW TRANSIT 10/04/2010  . IRRITABLE BOWEL SYNDROME 08/14/2009  . CYSTITIS, CHRONIC INTERSTITIAL 06/27/2009  . UNSPECIFIED HYPOTHYROIDISM 05/16/2009  . LIPOMA OF OTHER SPECIFIED SITES 04/13/2009  . BRUXISM 04/13/2009  . SYNCOPE 03/13/2009  . Abnormal finding on MRI of brain 02/07/2009  . MUSCLE WEAKNESS (GENERALIZED) 12/20/2008  . UNSPECIFIED ALLERGIC ALVEOLITIS AND PNEUMONITIS 08/02/2008  . HIP PAIN,  LEFT, CHRONIC 07/04/2008  . MALAISE AND FATIGUE 07/04/2008  . LOW BACK PAIN 05/26/2008  . PROTEINURIA 12/23/2007  . HYPERCALCEMIA 11/18/2007  . Essential hypertension 09/14/2007  . ANEMIA, B12 DEFICIENCY 05/18/2007  . 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 ALLERGIC RHINITIS 03/29/2007  . FIBROMYALGIA 03/29/2007      Laureen Abrahams, PT, DPT 12/29/17 3:22 PM    [facility] [address], [place], [place], [address] Phone: [phone]   Fax:  [phone]  Name: Kristin Pope MRN: [id number] Date of Birth: 08-Apr-1956

## 2018-01-07 ENCOUNTER — Encounter: Payer: Self-pay | Admitting: Physical Therapy

## 2018-01-07 ENCOUNTER — Ambulatory Visit: Payer: Medicaid Other | Admitting: Physical Therapy

## 2018-01-07 DIAGNOSIS — R293 Abnormal posture: Secondary | ICD-10-CM

## 2018-01-07 DIAGNOSIS — M6281 Muscle weakness (generalized): Secondary | ICD-10-CM

## 2018-01-07 DIAGNOSIS — M25511 Pain in right shoulder: Secondary | ICD-10-CM

## 2018-01-07 DIAGNOSIS — M25611 Stiffness of right shoulder, not elsewhere classified: Secondary | ICD-10-CM

## 2018-01-07 NOTE — Therapy (Signed)
[facility] [address] [place], [place], [address] Phone: [phone]   Fax:  [phone]  Physical Therapy Treatment/Recertification  Patient Details  Name: Kristin Pope MRN: [id number] Date of Birth: 04-18-1956 Referring Provider: Hart Robinsons, MD    Encounter Date: 01/07/2018  PT End of Session - 01/07/18 1523    Visit Number  22    Date for PT Re-Evaluation  03/04/18    Authorization Type  Medicaid; new request sent 01/07/18    Authorization Time Period  Medicaid approval:     Authorization - Visit Number  12    Authorization - Number of Visits  12    PT Start Time  2620    PT Stop Time  3559 session ended early due to pt not feeling well    PT Time Calculation (min)  25 min    Activity Tolerance  Patient tolerated treatment well;No increased pain    Behavior During Therapy  WFL for tasks assessed/performed       Past Medical History:  Diagnosis Date  . Allergic rhinitis   . Anemia, pernicious    b12 def.  . Anxiety   . Arthritis   . Biceps tendon tear    right  . Cervical spondylosis with radiculopathy    C2 -- C7  . Chronic fatigue   . Chronic pain    neck, back  . DDD (degenerative disc disease), cervical   . DDD (degenerative disc disease), lumbosacral   . Depression, major, recurrent ([place])   . Diverticulosis of colon   . Eczema   . Family history of adverse reaction to anesthesia    sister has problems waking up  . Family history of breast cancer   . Fibromyalgia 03/29/2007  . GERD (gastroesophageal reflux disease)   . Headache    constant headaches  . History of colonic diverticulitis 08/16/2010   w/ perforation (lower GI bleed)--- resolved without surgerical intervention  . 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 Hx of pyelonephritis 09/2005   due to UTI  . Hypertension   . IBS (irritable bowel  syndrome)    internates between constpation/ diarrhea  . Lumbosacral spondylosis    L2-3, L4-5  . Mild obstructive sleep apnea    per study 06/ 2009 mild osa  AHI 13/hr---  recommendation given mouth appliance, loss wt., cpap  . Mixed hyperlipidemia   . OA (osteoarthritis)    right shoulder AC joint  . Pre-diabetes   . Right rotator cuff tear     Past Surgical History:  Procedure Laterality Date  . BREAST SURGERY     breast biopsy-benign  . COLONOSCOPY  last one 08-08-2009  . DILATATION & CURRETTAGE/HYSTEROSCOPY WITH RESECTOCOPE  02-03-2011   dr Dellis Filbert  [facility]   polypectomy  . RADIAL OPTIC NEUROTOMY     twice in lumbar area of back-every 6 months  . SHOULDER ARTHROSCOPY WITH ROTATOR CUFF REPAIR Right 08/13/2017   Procedure: RIGHT SHOULDER ARTHROSCOPY, DEBRIDEMENT, BICEPS TENOTOMY, ROTATOR CUFF REPAIR, DISTAL CLAVICLE RESECTION;  Surgeon: Sydnee Cabal, MD;  Location: [place];  Service: Orthopedics;  Laterality: Right;  . TOTAL HIP ARTHROPLASTY Left 07/17/2015   Procedure: LEFT TOTAL HIP ARTHROPLASTY ANTERIOR APPROACH;  Surgeon: Paralee Cancel, MD;  [facility];  Service: Orthopedics;  Laterality: Left;  . TOTAL HIP ARTHROPLASTY Right 08/21/2015   Procedure: RIGHT TOTAL HIP ARTHROPLASTY ANTERIOR APPROACH;  Surgeon: Paralee Cancel, MD;  [facility];  Service: Orthopedics;  Laterality: Right;  . TRANSTHORACIC ECHOCARDIOGRAM  12/30/2015   ef 97-67%, grade 1 diastolic dysfunction/  mild MR/ trivial TR    There were no vitals filed for this visit.  Subjective Assessment - 01/07/18 1444    Subjective  shoulder is feeling pretty stiff; feels pain is 50% improved but still having some days of elevated pain    Patient Stated Goals  improve shoulder ROM, strength     Currently in Pain?  Yes    Pain Score  5     Pain Location  Shoulder    Pain Orientation  Right    Pain Descriptors / Indicators  Tightness stiffness    Pain Type  Surgical pain;Acute pain    Pain  Onset  More than a month ago    Pain Frequency  Constant    Aggravating Factors   picking up heavy items, weather    Pain Relieving Factors  pain medication, rest         [facility] PT Assessment - 01/07/18 1456      Observation/Other Assessments   Upper Extremity Functional Index   10/80      Functional Tests   Functional tests  Other      Other:   Other/ Comments  RUE overhead hold: 1 min, 20 sec      ROM / Strength   AROM / PROM / Strength  Strength      AROM   Right Shoulder Flexion  180 Degrees    Right Shoulder ABduction  180 Degrees    Right Shoulder Internal Rotation  -- reach behind back to bra strap only     Right Shoulder External Rotation  -- reach behind head to C7      Strength   Strength Assessment Site  Shoulder    Right/Left Shoulder  Right;Left    Right Shoulder Flexion  3/5    Right Shoulder ABduction  3/5    Right Shoulder Internal Rotation  4/5    Right Shoulder External Rotation  3+/5                   [facility] Adult PT Treatment/Exercise - 01/07/18 1451      Self-Care   Self-Care  Other Self-Care Comments    Other Self-Care Comments   long discussion with pt about her current progress and objective findings, goals of PT and upcoming plan of care to focus on strength and functional activities as pt has full ROM and strength is main limiting factor      Shoulder Exercises: Standing   Other Standing Exercises  sustained overhead activity x 1:20      Shoulder Exercises: ROM/Strengthening   UBE (Upper Arm Bike)  Level 2 x 6 minutes (3/3)             PT Education - 01/07/18 1522    Education provided  Yes    Education Details  see self care    Person(s) Educated  Patient    Methods  Explanation    Comprehension  Verbalized understanding       PT Short Term Goals - 01/07/18 1524      PT SHORT TERM GOAL #1   Title  Pt will demo consistency and independence with her HEP to decrease inflammation and improve shoulder ROM.    Time   4    Period  Weeks    Status  Achieved  PT SHORT TERM GOAL #2   Title  Pt will demo full Rt shoulder PROM into flexion and abduction to 160 deg, Rt shoulder IR atleast 60 deg to allow for optimal strengthening progression.     Status  Achieved      PT SHORT TERM GOAL #3   Title  Pt will demo pain AAROM in an upright postion with little to no pain and atleast 160 deg of shoulder elevation.    Status  Achieved      PT SHORT TERM GOAL #4   Title  Pts Rt grip strength will be atleast 5lb stronger than on the Lt, to allow for her to complete more fine motor activities in the kitchen with minimal difficulty.     Baseline  improved by 6lb on the Rt, still 3 lb weaker than the Lt    Status  Not Met        PT Long Term Goals - 01/07/18 1524      PT LONG TERM GOAL #1   Title  Pt will be independent in advanced HEP to allow for continuous progression towards her goals.     Baseline  01/07/18: HEP ongoing, plan to update to focus on functional strength    Period  Weeks    Status  On-going    Target Date  03/04/18      PT LONG TERM GOAL #2   Title  Pt will demonstrate atleast a 9 point improvement in her UEFI to reflect and improvement in her UE functional use.     Baseline  01/07/18: UEFI decreased from 28/80 to 10/80    Period  Weeks    Status  On-going    Target Date  03/04/18      PT LONG TERM GOAL #3   Title  Pt will demo atleast 155 deg of active Rt shoulder flexion with minimal shoulder shrug to allow her to reach over head into her cabinet without difficulty.     Baseline  165 deg, pain free    Time  12    Status  Achieved      PT LONG TERM GOAL #4   Title  Pt will report atleast 50% decrease in her pain and stiffness from the start of PT to allow for improved quality of life and daily activity tolerance.     Baseline  01/07/18: met    Time  12    Period  Weeks    Status  Achieved      PT LONG TERM GOAL #5   Title  Pt will demo improved shoulder strength and endurance  evident by her ability to maintain RUE elevated above her head for up to 10 min, and her report of being able to successfully wash her hair without difficulty.     Baseline  01/07/18: <2 min today; revised goal to 5 min    Time  12    Period  Weeks    Status  Not Met      PT LONG TERM GOAL #6   Title  Pt will demo improved RUE strength to atleast 4/5 MMT which will allow her to lift small cans/objects overhead into her cabinet at home.     Baseline  01/07/18: 3/5    Status  On-going    Target Date  03/04/18      PT LONG TERM GOAL #7   Title  Pt will demo improved shoulder strength and endurance evident by her ability to maintain  RUE elevated above her head for up to 5 min, and her report of being able to successfully wash her hair without difficulty.     Status  New    Target Date  03/04/18            Plan - 01/07/18 1527    Clinical Impression Statement  Pt arrived to session today c/o increased fatigue and not feeling well due to recent colonoscopy and limited food intake before and after procedure.  Pt has met LTGs #3, 4 and did not meet LTG #5 and feel this goal in unattainable at this time, so revised goal to reflect more appropriate strength.  Pt's LTGs #1, 2 and 6 are ongoing due to fatigue and slower progress towards goals.  Added LTG #7 today.  Pt continues to demonstrate decreased strength affecting ADLs and functional activities, and would recommend PT 1x/wk x 8 weeks to continue to work on functional strength.      Rehab Potential  Fair    PT Frequency  1x / week    PT Duration  8 weeks    PT Treatment/Interventions  ADLs/Self Care Home Management;Cryotherapy;Electrical Stimulation;Moist Heat;Iontophoresis 34m/ml Dexamethasone;Therapeutic activities;Therapeutic exercise;Patient/family education;Orthotic Fit/Training;Neuromuscular re-education;Manual techniques;Taping;Dry needling;Passive range of motion;Scar mobilization    PT Next Visit Plan  Rt shoulder strength with  emphasis on scapular stabilization/depression, closed chain    Consulted and Agree with Plan of Care  Patient       Patient will benefit from skilled therapeutic intervention in order to improve the following deficits and impairments:  Decreased activity tolerance, Impaired flexibility, Impaired UE functional use, Hypomobility, Decreased strength, Decreased range of motion, Decreased endurance, Decreased coordination, Decreased scar mobility, Increased muscle spasms, Postural dysfunction, Pain, Improper body mechanics  Visit Diagnosis: Muscle weakness (generalized) - Plan: PT plan of care cert/re-cert  Stiffness of right shoulder, not elsewhere classified - Plan: PT plan of care cert/re-cert  Acute pain of right shoulder - Plan: PT plan of care cert/re-cert  Abnormal posture - Plan: PT plan of care cert/re-cert     Problem List Patient Active Problem List   Diagnosis Date Noted  . S/P arthroscopy of right shoulder 08/13/2017  . Genetic testing 05/29/2017  . Family history of breast cancer   . Hyperlipidemia LDL goal <70 12/30/2015  . Cerebrovascular disease or lesion 12/30/2015  . Mild pulmonary hypertension ([place] 12/30/2015  . TIA (transient ischemic attack) 12/28/2015  . GERD (gastroesophageal reflux disease) 07/23/2015  . Chronic pain 07/23/2015  . Obese 07/19/2015  . S/P left THA, AA 07/17/2015  . 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 Insomnia 03/03/2011  . OSA (obstructive sleep apnea) 03/03/2011  . Depression, major, recurrent ([place] 12/02/2010  . CONSTIPATION, SLOW TRANSIT 10/04/2010  . IRRITABLE BOWEL SYNDROME 08/14/2009  . CYSTITIS, CHRONIC INTERSTITIAL 06/27/2009  . UNSPECIFIED HYPOTHYROIDISM 05/16/2009  . LIPOMA OF OTHER SPECIFIED SITES 04/13/2009  . BRUXISM 04/13/2009  . SYNCOPE 03/13/2009  . Abnormal finding on MRI of brain 02/07/2009  . MUSCLE WEAKNESS (GENERALIZED) 12/20/2008  . UNSPECIFIED ALLERGIC ALVEOLITIS AND PNEUMONITIS 08/02/2008  . HIP PAIN,  LEFT, CHRONIC 07/04/2008  . MALAISE AND FATIGUE 07/04/2008  . LOW BACK PAIN 05/26/2008  . PROTEINURIA 12/23/2007  . HYPERCALCEMIA 11/18/2007  . Essential hypertension 09/14/2007  . ANEMIA, B12 DEFICIENCY 05/18/2007  . 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 ALLERGIC RHINITIS 03/29/2007  . FIBROMYALGIA 03/29/2007      SLaureen Abrahams PT, DPT 01/07/18 3:33 PM    [facility] [address], [place], [place], [address] Phone: [phone]   Fax:  [phone]  Name: Kristin Pope MRN: [id number] Date of Birth: 1956/05/21

## 2018-01-27 ENCOUNTER — Ambulatory Visit: Payer: Medicaid Other | Admitting: Physical Therapy

## 2018-02-03 ENCOUNTER — Ambulatory Visit: Payer: Medicaid Other | Attending: Specialist

## 2018-02-03 DIAGNOSIS — M6281 Muscle weakness (generalized): Secondary | ICD-10-CM | POA: Diagnosis not present

## 2018-02-03 DIAGNOSIS — M25511 Pain in right shoulder: Secondary | ICD-10-CM | POA: Diagnosis present

## 2018-02-03 DIAGNOSIS — R293 Abnormal posture: Secondary | ICD-10-CM | POA: Diagnosis present

## 2018-02-03 DIAGNOSIS — M25611 Stiffness of right shoulder, not elsewhere classified: Secondary | ICD-10-CM | POA: Diagnosis present

## 2018-02-03 NOTE — Therapy (Signed)
[facility] [address], [place], [place], [address] Phone: [phone]   Fax:  [phone]  Physical Therapy Treatment  Patient Details  Name: Kristin Pope MRN: [id number] Date of Birth: 02/21/1956 Referring Provider: Hart Robinsons, MD    Encounter Date: 02/03/2018  PT End of Session - 02/03/18 1611    Visit Number  23    Date for PT Re-Evaluation  03/04/18    Authorization Type  8 visits 02/21/18-03/18/18 Medicaid    Authorization - Visit Number  1    Authorization - Number of Visits  8    PT Start Time  5631    PT Stop Time  4970    PT Time Calculation (min)  50 min    Activity Tolerance  Patient tolerated treatment well;No increased pain    Behavior During Therapy  WFL for tasks assessed/performed       Past Medical History:  Diagnosis Date  . Allergic rhinitis   . Anemia, pernicious    b12 def.  . Anxiety   . Arthritis   . Biceps tendon tear    right  . Cervical spondylosis with radiculopathy    C2 -- C7  . Chronic fatigue   . Chronic pain    neck, back  . DDD (degenerative disc disease), cervical   . DDD (degenerative disc disease), lumbosacral   . Depression, major, recurrent ([place])   . Diverticulosis of colon   . Eczema   . Family history of adverse reaction to anesthesia    sister has problems waking up  . Family history of breast cancer   . Fibromyalgia 03/29/2007  . GERD (gastroesophageal reflux disease)   . Headache    constant headaches  . History of colonic diverticulitis 08/16/2010   w/ perforation (lower GI bleed)--- resolved without surgerical intervention  . 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 Hx of pyelonephritis 09/2005   due to UTI  . Hypertension   . IBS (irritable bowel syndrome)    internates between constpation/ diarrhea  . Lumbosacral spondylosis    L2-3, L4-5  . Mild obstructive sleep  apnea    per study 06/ 2009 mild osa  AHI 13/hr---  recommendation given mouth appliance, loss wt., cpap  . Mixed hyperlipidemia   . OA (osteoarthritis)    right shoulder AC joint  . Pre-diabetes   . Right rotator cuff tear     Past Surgical History:  Procedure Laterality Date  . BREAST SURGERY     breast biopsy-benign  . COLONOSCOPY  last one 08-08-2009  . DILATATION & CURRETTAGE/HYSTEROSCOPY WITH RESECTOCOPE  02-03-2011   dr Dellis Filbert  [facility]   polypectomy  . RADIAL OPTIC NEUROTOMY     twice in lumbar area of back-every 6 months  . SHOULDER ARTHROSCOPY WITH ROTATOR CUFF REPAIR Right 08/13/2017   Procedure: RIGHT SHOULDER ARTHROSCOPY, DEBRIDEMENT, BICEPS TENOTOMY, ROTATOR CUFF REPAIR, DISTAL CLAVICLE RESECTION;  Surgeon: Sydnee Cabal, MD;  Location: [place];  Service: Orthopedics;  Laterality: Right;  . TOTAL HIP ARTHROPLASTY Left 07/17/2015   Procedure: LEFT TOTAL HIP ARTHROPLASTY ANTERIOR APPROACH;  Surgeon: Paralee Cancel, MD;  Location[facility];  Service: Orthopedics;  Laterality: Left;  . TOTAL HIP ARTHROPLASTY Right 08/21/2015   Procedure: RIGHT TOTAL HIP ARTHROPLASTY ANTERIOR APPROACH;  Surgeon: Paralee Cancel, MD;  [facility];  Service: Orthopedics;  Laterality: Right;  . TRANSTHORACIC ECHOCARDIOGRAM  12/30/2015  ef 22-29%, grade 1 diastolic dysfunction/  mild MR/ trivial TR    There were no vitals filed for this visit.  Subjective Assessment - 02/03/18 1544    Subjective  I am feeling much better than I was when assessed last session.      Currently in Pain?  Yes    Pain Score  4     Pain Location  Shoulder    Pain Orientation  Right    Pain Descriptors / Indicators  Tightness;Aching    Pain Type  Chronic pain    Pain Onset  More than a month ago    Pain Frequency  Constant    Aggravating Factors   use of Rt UE, weather changes    Pain Relieving Factors  pain medication, rest                       [facility] Adult PT Treatment/Exercise  - 02/03/18 0001      Shoulder Exercises: Supine   Flexion  Right;20 reps;Weights scapular evlevation-tactile cues needed    Shoulder Flexion Weight (lbs)  1      Shoulder Exercises: Sidelying   External Rotation  Strengthening;Right;Weights;20 reps    External Rotation Weight (lbs)  2    ABduction  Strengthening;Right;20 reps    ABduction Weight (lbs)  1      Shoulder Exercises: Standing   Flexion  Right;Weights;20 reps    Shoulder Flexion Weight (lbs)  1 counter to cabinet (1st shelf height)    ABduction  Right;10 reps;Weights counter to 1st shelf height; scaption    Shoulder ABduction Weight (lbs)  1      Shoulder Exercises: Pulleys   Other Pulley Exercises  IR x 3 minutes       Shoulder Exercises: ROM/Strengthening   UBE (Upper Arm Bike)  Level 2 x 6 minutes (3/3)    Other ROM/Strengthening Exercises  NuStep: level 6x 8 mintues- arms and legs      Shoulder Exercises: Power [employer] operational officer Limitations  #25 x2 sets     Other Power [employer] Exercises  B bicep curls 3x5 reps with #25    Other Power Tower Exercises  B tricep extension 2x10 reps with #30               PT Short Term Goals - 01/07/18 1524      PT SHORT TERM GOAL #1   Title  Pt will demo consistency and independence with her HEP to decrease inflammation and improve shoulder ROM.    Time  4    Period  Weeks    Status  Achieved      PT SHORT TERM GOAL #2   Title  Pt will demo full Rt shoulder PROM into flexion and abduction to 160 deg, Rt shoulder IR atleast 60 deg to allow for optimal strengthening progression.     Status  Achieved      PT SHORT TERM GOAL #3   Title  Pt will demo pain AAROM in an upright postion with little to no pain and atleast 160 deg of shoulder elevation.    Status  Achieved      PT SHORT TERM GOAL #4   Title  Pts Rt grip strength will be atleast 5lb stronger than on the Lt, to allow for her to complete more fine motor activities in the kitchen with minimal  difficulty.     Baseline  improved  by 6lb on the Rt, still 3 lb weaker than the Lt    Status  Not Met        PT Long Term Goals - 01/07/18 1524      PT LONG TERM GOAL #1   Title  Pt will be independent in advanced HEP to allow for continuous progression towards her goals.     Baseline  01/07/18: HEP ongoing, plan to update to focus on functional strength    Period  Weeks    Status  On-going    Target Date  03/04/18      PT LONG TERM GOAL #2   Title  Pt will demonstrate atleast a 9 point improvement in her UEFI to reflect and improvement in her UE functional use.     Baseline  01/07/18: UEFI decreased from 28/80 to 10/80    Period  Weeks    Status  On-going    Target Date  03/04/18      PT LONG TERM GOAL #3   Title  Pt will demo atleast 155 deg of active Rt shoulder flexion with minimal shoulder shrug to allow her to reach over head into her cabinet without difficulty.     Baseline  165 deg, pain free    Time  12    Status  Achieved      PT LONG TERM GOAL #4   Title  Pt will report atleast 50% decrease in her pain and stiffness from the start of PT to allow for improved quality of life and daily activity tolerance.     Baseline  01/07/18: met    Time  12    Period  Weeks    Status  Achieved      PT LONG TERM GOAL #5   Title  Pt will demo improved shoulder strength and endurance evident by her ability to maintain RUE elevated above her head for up to 10 min, and her report of being able to successfully wash her hair without difficulty.     Baseline  01/07/18: <2 min today; revised goal to 5 min    Time  12    Period  Weeks    Status  Not Met      PT LONG TERM GOAL #6   Title  Pt will demo improved RUE strength to atleast 4/5 MMT which will allow her to lift small cans/objects overhead into her cabinet at home.     Baseline  01/07/18: 3/5    Status  On-going    Target Date  03/04/18      PT LONG TERM GOAL #7   Title  Pt will demo improved shoulder strength and endurance  evident by her ability to maintain RUE elevated above her head for up to 5 min, and her report of being able to successfully wash her hair without difficulty.     Status  New    Target Date  03/04/18            Plan - 02/03/18 1550    Clinical Impression Statement  Pt with lapse in treatment due to canceled appointment last week.  Pt with reduced functional strength of Rt UE especially with overhead use.  Pt reports that she can use her Rt UE more now than she could 4 weeks ago when she was reassessed.  Pt required verbal cues for scapular position with weight training activities.  Pt will benefit from 1x/wk to advance functional strength of the Rt UE.  Rehab Potential  Fair    PT Frequency  1x / week    PT Duration  8 weeks    PT Treatment/Interventions  ADLs/Self Care Home Management;Cryotherapy;Electrical Stimulation;Moist Heat;Iontophoresis 87m/ml Dexamethasone;Therapeutic activities;Therapeutic exercise;Patient/family education;Orthotic Fit/Training;Neuromuscular re-education;Manual techniques;Taping;Dry needling;Passive range of motion;Scar mobilization    PT Next Visit Plan  Rt shoulder strength with emphasis on scapular stabilization/depression, closed chain    Consulted and Agree with Plan of Care  Patient       Patient will benefit from skilled therapeutic intervention in order to improve the following deficits and impairments:  Decreased activity tolerance, Impaired flexibility, Impaired UE functional use, Hypomobility, Decreased strength, Decreased range of motion, Decreased endurance, Decreased coordination, Decreased scar mobility, Increased muscle spasms, Postural dysfunction, Pain, Improper body mechanics  Visit Diagnosis: Muscle weakness (generalized)  Stiffness of right shoulder, not elsewhere classified  Acute pain of right shoulder  Abnormal posture     Problem List Patient Active Problem List   Diagnosis Date Noted  . S/P arthroscopy of right shoulder  08/13/2017  . Genetic testing 05/29/2017  . Family history of breast cancer   . Hyperlipidemia LDL goal <70 12/30/2015  . Cerebrovascular disease or lesion 12/30/2015  . Mild pulmonary hypertension ([place] 12/30/2015  . TIA (transient ischemic attack) 12/28/2015  . GERD (gastroesophageal reflux disease) 07/23/2015  . Chronic pain 07/23/2015  . Obese 07/19/2015  . S/P left THA, AA 07/17/2015  . 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 Insomnia 03/03/2011  . OSA (obstructive sleep apnea) 03/03/2011  . Depression, major, recurrent ([place] 12/02/2010  . CONSTIPATION, SLOW TRANSIT 10/04/2010  . IRRITABLE BOWEL SYNDROME 08/14/2009  . CYSTITIS, CHRONIC INTERSTITIAL 06/27/2009  . UNSPECIFIED HYPOTHYROIDISM 05/16/2009  . LIPOMA OF OTHER SPECIFIED SITES 04/13/2009  . BRUXISM 04/13/2009  . SYNCOPE 03/13/2009  . Abnormal finding on MRI of brain 02/07/2009  . MUSCLE WEAKNESS (GENERALIZED) 12/20/2008  . UNSPECIFIED ALLERGIC ALVEOLITIS AND PNEUMONITIS 08/02/2008  . HIP PAIN, LEFT, CHRONIC 07/04/2008  . MALAISE AND FATIGUE 07/04/2008  . LOW BACK PAIN 05/26/2008  . PROTEINURIA 12/23/2007  . HYPERCALCEMIA 11/18/2007  . Essential hypertension 09/14/2007  . ANEMIA, B12 DEFICIENCY 05/18/2007  . 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 ALLERGIC RHINITIS 03/29/2007  . FIBROMYALGIA 03/29/2007     KSigurd Sos PT 02/03/18 4:22 PM  [facility] [address] [place] [address]Phone: [phone]  Fax:  [phone] Name: GSIDNI FUSCOMRN: [id number] of Birth: [birth year]

## 2018-02-09 ENCOUNTER — Ambulatory Visit: Payer: Medicaid Other

## 2018-02-09 DIAGNOSIS — R293 Abnormal posture: Secondary | ICD-10-CM

## 2018-02-09 DIAGNOSIS — M25611 Stiffness of right shoulder, not elsewhere classified: Secondary | ICD-10-CM

## 2018-02-09 DIAGNOSIS — M6281 Muscle weakness (generalized): Secondary | ICD-10-CM | POA: Diagnosis not present

## 2018-02-09 DIAGNOSIS — M25511 Pain in right shoulder: Secondary | ICD-10-CM

## 2018-02-09 NOTE — Therapy (Signed)
[facility] [address] [place], [place], [address] Phone: [phone]   Fax:  [phone]  Physical Therapy Treatment  Patient Details  Name: Kristin Pope MRN: [id number] Date of Birth: [birth year] Referring Provider: Hart Robinsons, MD    Encounter Date: 02/09/2018  PT End of Session - 02/09/18 1158    Visit Number  24    Date for PT Re-Evaluation  03/04/18    Authorization Type  8 visits 02/21/18-03/18/18 Medicaid    Authorization Time Period  Medicaid approval:     Authorization - Visit Number  2    Authorization - Number of Visits  8    PT Start Time  1120    PT Stop Time  5498    PT Time Calculation (min)  44 min    Activity Tolerance  Patient tolerated treatment well;No increased pain    Behavior During Therapy  WFL for tasks assessed/performed       Past Medical History:  Diagnosis Date  . Allergic rhinitis   . Anemia, pernicious    b12 def.  . Anxiety   . Arthritis   . Biceps tendon tear    right  . Cervical spondylosis with radiculopathy    C2 -- C7  . Chronic fatigue   . Chronic pain    neck, back  . DDD (degenerative disc disease), cervical   . DDD (degenerative disc disease), lumbosacral   . Depression, major, recurrent ([place])   . Diverticulosis of colon   . Eczema   . Family history of adverse reaction to anesthesia    sister has problems waking up  . Family history of breast cancer   . Fibromyalgia 03/29/2007  . GERD (gastroesophageal reflux disease)   . Headache    constant headaches  . History of colonic diverticulitis 08/16/2010   w/ perforation (lower GI bleed)--- resolved without surgerical intervention  . 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 Hx of pyelonephritis 09/2005   due to UTI  . Hypertension   . IBS (irritable bowel syndrome)    internates between constpation/ diarrhea  . Lumbosacral  spondylosis    L2-3, L4-5  . Mild obstructive sleep apnea    per study 06/ 2009 mild osa  AHI 13/hr---  recommendation given mouth appliance, loss wt., cpap  . Mixed hyperlipidemia   . OA (osteoarthritis)    right shoulder AC joint  . Pre-diabetes   . Right rotator cuff tear     Past Surgical History:  Procedure Laterality Date  . BREAST SURGERY     breast biopsy-benign  . COLONOSCOPY  last one 08-08-2009  . DILATATION & CURRETTAGE/HYSTEROSCOPY WITH RESECTOCOPE  02-03-2011   dr Dellis Filbert  [facility]   polypectomy  . RADIAL OPTIC NEUROTOMY     twice in lumbar area of back-every 6 months  . SHOULDER ARTHROSCOPY WITH ROTATOR CUFF REPAIR Right 08/13/2017   Procedure: RIGHT SHOULDER ARTHROSCOPY, DEBRIDEMENT, BICEPS TENOTOMY, ROTATOR CUFF REPAIR, DISTAL CLAVICLE RESECTION;  Surgeon: Sydnee Cabal, MD;  Location: [place];  Service: Orthopedics;  Laterality: Right;  . TOTAL HIP ARTHROPLASTY Left 07/17/2015   Procedure: LEFT TOTAL HIP ARTHROPLASTY ANTERIOR APPROACH;  Surgeon: Paralee Cancel, MD;  [facility];  Service: Orthopedics;  Laterality: Left;  . TOTAL HIP ARTHROPLASTY Right 08/21/2015   Procedure: RIGHT TOTAL HIP ARTHROPLASTY ANTERIOR APPROACH;  Surgeon: Paralee Cancel, MD;  [facility];  Service:  Orthopedics;  Laterality: Right;  . TRANSTHORACIC ECHOCARDIOGRAM  12/30/2015   ef 95-63%, grade 1 diastolic dysfunction/  mild MR/ trivial TR    There were no vitals filed for this visit.  Subjective Assessment - 02/09/18 1124    Subjective  I felt good after last session.      Currently in Pain?  Yes    Pain Score  2     Pain Location  Shoulder    Pain Orientation  Right    Pain Descriptors / Indicators  Tightness;Aching    Pain Onset  More than a month ago    Pain Frequency  Constant    Aggravating Factors   use of Rt UE, weather changes    Pain Relieving Factors  pain medication, rest                       [facility] Adult PT Treatment/Exercise -  02/09/18 0001      Shoulder Exercises: Supine   Flexion  Right;20 reps;Weights scapular evlevation-tactile cues needed    Shoulder Flexion Weight (lbs)  1      Shoulder Exercises: Sidelying   External Rotation  Strengthening;Right;Weights;20 reps    External Rotation Weight (lbs)  2    ABduction  Strengthening;Right;20 reps    ABduction Weight (lbs)  1      Shoulder Exercises: Standing   Flexion  Right;Weights;20 reps    Shoulder Flexion Weight (lbs)  1 counter to cabinet (1st shelf height)    ABduction  Right;10 reps;Weights counter to 1st shelf height; scaption    Shoulder ABduction Weight (lbs)  1      Shoulder Exercises: Pulleys   Other Pulley Exercises  IR x 3 minutes       Shoulder Exercises: ROM/Strengthening   UBE (Upper Arm Bike)  Level 2 x 6 minutes (3/3)    Other ROM/Strengthening Exercises  NuStep: level 3 to 5 x 10 mintues- arms and legs      Shoulder Exercises: Power [employer] operational officer Limitations  25# x2 sets     Other Power [employer] Exercises  B bicep curls 3x5 reps with 25#    Other Power Engineer, water  B tricep extension 2x10 reps with #30               PT Short Term Goals - 01/07/18 1524      PT SHORT TERM GOAL #1   Title  Pt will demo consistency and independence with her HEP to decrease inflammation and improve shoulder ROM.    Time  4    Period  Weeks    Status  Achieved      PT SHORT TERM GOAL #2   Title  Pt will demo full Rt shoulder PROM into flexion and abduction to 160 deg, Rt shoulder IR atleast 60 deg to allow for optimal strengthening progression.     Status  Achieved      PT SHORT TERM GOAL #3   Title  Pt will demo pain AAROM in an upright postion with little to no pain and atleast 160 deg of shoulder elevation.    Status  Achieved      PT SHORT TERM GOAL #4   Title  Pts Rt grip strength will be atleast 5lb stronger than on the Lt, to allow for her to complete more fine motor activities in the kitchen with  minimal difficulty.     Baseline  improved by 6lb on the Rt, still 3 lb weaker than the Lt    Status  Not Met        PT Long Term Goals - 01/07/18 1524      PT LONG TERM GOAL #1   Title  Pt will be independent in advanced HEP to allow for continuous progression towards her goals.     Baseline  01/07/18: HEP ongoing, plan to update to focus on functional strength    Period  Weeks    Status  On-going    Target Date  03/04/18      PT LONG TERM GOAL #2   Title  Pt will demonstrate atleast a 9 point improvement in her UEFI to reflect and improvement in her UE functional use.     Baseline  01/07/18: UEFI decreased from 28/80 to 10/80    Period  Weeks    Status  On-going    Target Date  03/04/18      PT LONG TERM GOAL #3   Title  Pt will demo atleast 155 deg of active Rt shoulder flexion with minimal shoulder shrug to allow her to reach over head into her cabinet without difficulty.     Baseline  165 deg, pain free    Time  12    Status  Achieved      PT LONG TERM GOAL #4   Title  Pt will report atleast 50% decrease in her pain and stiffness from the start of PT to allow for improved quality of life and daily activity tolerance.     Baseline  01/07/18: met    Time  12    Period  Weeks    Status  Achieved      PT LONG TERM GOAL #5   Title  Pt will demo improved shoulder strength and endurance evident by her ability to maintain RUE elevated above her head for up to 10 min, and her report of being able to successfully wash her hair without difficulty.     Baseline  01/07/18: <2 min today; revised goal to 5 min    Time  12    Period  Weeks    Status  Not Met      PT LONG TERM GOAL #6   Title  Pt will demo improved RUE strength to atleast 4/5 MMT which will allow her to lift small cans/objects overhead into her cabinet at home.     Baseline  01/07/18: 3/5    Status  On-going    Target Date  03/04/18      PT LONG TERM GOAL #7   Title  Pt will demo improved shoulder strength and  endurance evident by her ability to maintain RUE elevated above her head for up to 5 min, and her report of being able to successfully wash her hair without difficulty.     Status  New    Target Date  03/04/18            Plan - 02/09/18 1137    Clinical Impression Statement  Pt with reduced Rt shoulder pain overall today.  Pt tolerated session well today with strength and endurance training.  Pt requires scapular cueing to reduce substitution with overhead movement and row/extension.  Pt with limited use of Rt UE overhead and with endurance tasks and will continue to benefit from skilled PT for strength and endurance progression of Rt UE to improve functional use.      PT Frequency  1x / week    PT Duration  8 weeks    PT Treatment/Interventions  ADLs/Self Care Home Management;Cryotherapy;Electrical Stimulation;Moist Heat;Iontophoresis 64m/ml Dexamethasone;Therapeutic activities;Therapeutic exercise;Patient/family education;Orthotic Fit/Training;Neuromuscular re-education;Manual techniques;Taping;Dry needling;Passive range of motion;Scar mobilization    PT Next Visit Plan  Rt shoulder strength with emphasis on scapular stabilization/depression, closed chain    Consulted and Agree with Plan of Care  Patient       Patient will benefit from skilled therapeutic intervention in order to improve the following deficits and impairments:  Decreased activity tolerance, Impaired flexibility, Impaired UE functional use, Hypomobility, Decreased strength, Decreased range of motion, Decreased endurance, Decreased coordination, Decreased scar mobility, Increased muscle spasms, Postural dysfunction, Pain, Improper body mechanics  Visit Diagnosis: Muscle weakness (generalized)  Stiffness of right shoulder, not elsewhere classified  Acute pain of right shoulder  Abnormal posture     Problem List Patient Active Problem List   Diagnosis Date Noted  . S/P arthroscopy of right shoulder 08/13/2017  .  Genetic testing 05/29/2017  . Family history of breast cancer   . Hyperlipidemia LDL goal <70 12/30/2015  . Cerebrovascular disease or lesion 12/30/2015  . Mild pulmonary hypertension ([place] 12/30/2015  . TIA (transient ischemic attack) 12/28/2015  . GERD (gastroesophageal reflux disease) 07/23/2015  . Chronic pain 07/23/2015  . Obese 07/19/2015  . S/P left THA, AA 07/17/2015  . 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 Insomnia 03/03/2011  . OSA (obstructive sleep apnea) 03/03/2011  . Depression, major, recurrent ([place] 12/02/2010  . CONSTIPATION, SLOW TRANSIT 10/04/2010  . IRRITABLE BOWEL SYNDROME 08/14/2009  . CYSTITIS, CHRONIC INTERSTITIAL 06/27/2009  . UNSPECIFIED HYPOTHYROIDISM 05/16/2009  . LIPOMA OF OTHER SPECIFIED SITES 04/13/2009  . BRUXISM 04/13/2009  . SYNCOPE 03/13/2009  . Abnormal finding on MRI of brain 02/07/2009  . MUSCLE WEAKNESS (GENERALIZED) 12/20/2008  . UNSPECIFIED ALLERGIC ALVEOLITIS AND PNEUMONITIS 08/02/2008  . HIP PAIN, LEFT, CHRONIC 07/04/2008  . MALAISE AND FATIGUE 07/04/2008  . LOW BACK PAIN 05/26/2008  . PROTEINURIA 12/23/2007  . HYPERCALCEMIA 11/18/2007  . Essential hypertension 09/14/2007  . ANEMIA, B12 DEFICIENCY 05/18/2007  . 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 ALLERGIC RHINITIS 03/29/2007  . FIBROMYALGIA 03/29/2007     KSigurd Sos PT 02/09/18 12:00 PM  [facility] [address] [place] [address]Phone: [phone]  Fax:  [phone] Name: Kristin ALKEMAMRN: [id number] of Birth: 931-May-1957

## 2018-02-17 ENCOUNTER — Ambulatory Visit: Payer: Medicaid Other

## 2018-02-17 DIAGNOSIS — M25511 Pain in right shoulder: Secondary | ICD-10-CM

## 2018-02-17 DIAGNOSIS — M6281 Muscle weakness (generalized): Secondary | ICD-10-CM

## 2018-02-17 DIAGNOSIS — R293 Abnormal posture: Secondary | ICD-10-CM

## 2018-02-17 DIAGNOSIS — M25611 Stiffness of right shoulder, not elsewhere classified: Secondary | ICD-10-CM

## 2018-02-17 NOTE — Therapy (Signed)
[facility] [address] [place], [place], [address] Phone: [phone]   Fax:  [phone]  Physical Therapy Treatment  Patient Details  Name: Kristin Pope MRN: [id number] Date of Birth: 09/11/1956 Referring Provider: Hart Robinsons, MD    Encounter Date: 02/17/2018  PT End of Session - 02/17/18 1606    Visit Number  25    Date for PT Re-Evaluation  03/04/18    Authorization Type  8 visits 02/21/18-03/18/18 Medicaid    Authorization Time Period  Medicaid approval:     Authorization - Visit Number  3    Authorization - Number of Visits  8    PT Start Time  1532    PT Stop Time  8546    PT Time Calculation (min)  43 min    Activity Tolerance  Patient tolerated treatment well;No increased pain    Behavior During Therapy  WFL for tasks assessed/performed       Past Medical History:  Diagnosis Date  . Allergic rhinitis   . Anemia, pernicious    b12 def.  . Anxiety   . Arthritis   . Biceps tendon tear    right  . Cervical spondylosis with radiculopathy    C2 -- C7  . Chronic fatigue   . Chronic pain    neck, back  . DDD (degenerative disc disease), cervical   . DDD (degenerative disc disease), lumbosacral   . Depression, major, recurrent ([place])   . Diverticulosis of colon   . Eczema   . Family history of adverse reaction to anesthesia    sister has problems waking up  . Family history of breast cancer   . Fibromyalgia 03/29/2007  . GERD (gastroesophageal reflux disease)   . Headache    constant headaches  . History of colonic diverticulitis 08/16/2010   w/ perforation (lower GI bleed)--- resolved without surgerical intervention  . 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 Hx of pyelonephritis 09/2005   due to UTI  . Hypertension   . IBS (irritable bowel syndrome)    internates between constpation/ diarrhea  . Lumbosacral  spondylosis    L2-3, L4-5  . Mild obstructive sleep apnea    per study 06/ 2009 mild osa  AHI 13/hr---  recommendation given mouth appliance, loss wt., cpap  . Mixed hyperlipidemia   . OA (osteoarthritis)    right shoulder AC joint  . Pre-diabetes   . Right rotator cuff tear     Past Surgical History:  Procedure Laterality Date  . BREAST SURGERY     breast biopsy-benign  . COLONOSCOPY  last one 08-08-2009  . DILATATION & CURRETTAGE/HYSTEROSCOPY WITH RESECTOCOPE  02-03-2011   dr Dellis Filbert  [facility]   polypectomy  . RADIAL OPTIC NEUROTOMY     twice in lumbar area of back-every 6 months  . SHOULDER ARTHROSCOPY WITH ROTATOR CUFF REPAIR Right 08/13/2017   Procedure: RIGHT SHOULDER ARTHROSCOPY, DEBRIDEMENT, BICEPS TENOTOMY, ROTATOR CUFF REPAIR, DISTAL CLAVICLE RESECTION;  Surgeon: Sydnee Cabal, MD;  Location: [place];  Service: Orthopedics;  Laterality: Right;  . TOTAL HIP ARTHROPLASTY Left 07/17/2015   Procedure: LEFT TOTAL HIP ARTHROPLASTY ANTERIOR APPROACH;  Surgeon: Paralee Cancel, MD;  [facility];  Service: Orthopedics;  Laterality: Left;  . TOTAL HIP ARTHROPLASTY Right 08/21/2015   Procedure: RIGHT TOTAL HIP ARTHROPLASTY ANTERIOR APPROACH;  Surgeon: Paralee Cancel, MD;  [facility];  Service:  Orthopedics;  Laterality: Right;  . TRANSTHORACIC ECHOCARDIOGRAM  12/30/2015   ef 83-15%, grade 1 diastolic dysfunction/  mild MR/ trivial TR    There were no vitals filed for this visit.  Subjective Assessment - 02/17/18 1533    Subjective  I've been doing OK.  I took a pain pill earlier because I was coming here.      Currently in Pain?  No/denies         [facility] PT Assessment - 02/17/18 0001      Observation/Other Assessments   Upper Extremity Functional Index   33/80                   [facility] Adult PT Treatment/Exercise - 02/17/18 0001      Shoulder Exercises: Supine   Flexion  Right;20 reps;Weights scapular evlevation-tactile cues needed     Shoulder Flexion Weight (lbs)  1      Shoulder Exercises: Sidelying   External Rotation  Strengthening;Right;Weights;20 reps    External Rotation Weight (lbs)  2    ABduction  Strengthening;Right;20 reps    ABduction Weight (lbs)  1      Shoulder Exercises: Standing   Flexion  Right;Weights;20 reps    Shoulder Flexion Weight (lbs)  1 counter to cabinet (1st shelf height)    ABduction  Right;10 reps;Weights counter to 1st shelf height; scaption    Shoulder ABduction Weight (lbs)  1      Shoulder Exercises: Pulleys   Other Pulley Exercises  IR x 3 minutes       Shoulder Exercises: ROM/Strengthening   UBE (Upper Arm Bike)  Level 2 x 6 minutes (3/3)    Other ROM/Strengthening Exercises  NuStep: level 3 to 5 x 10 mintues- arms and legs      Shoulder Exercises: Power [employer]  20 reps    Row Limitations  25# x2 sets     Other Power [employer] Exercises  B bicep curls 3x5 reps with 25#    Other Power Engineer, water  B tricep extension 2x10 reps with 30#               PT Short Term Goals - 01/07/18 1524      PT SHORT TERM GOAL #1   Title  Pt will demo consistency and independence with her HEP to decrease inflammation and improve shoulder ROM.    Time  4    Period  Weeks    Status  Achieved      PT SHORT TERM GOAL #2   Title  Pt will demo full Rt shoulder PROM into flexion and abduction to 160 deg, Rt shoulder IR atleast 60 deg to allow for optimal strengthening progression.     Status  Achieved      PT SHORT TERM GOAL #3   Title  Pt will demo pain AAROM in an upright postion with little to no pain and atleast 160 deg of shoulder elevation.    Status  Achieved      PT SHORT TERM GOAL #4   Title  Pts Rt grip strength will be atleast 5lb stronger than on the Lt, to allow for her to complete more fine motor activities in the kitchen with minimal difficulty.     Baseline  improved by 6lb on the Rt, still 3 lb weaker than the Lt    Status  Not Met        PT Long Term  Goals - 01/07/18 1524  PT LONG TERM GOAL #1   Title  Pt will be independent in advanced HEP to allow for continuous progression towards her goals.     Baseline  01/07/18: HEP ongoing, plan to update to focus on functional strength    Period  Weeks    Status  On-going    Target Date  03/04/18      PT LONG TERM GOAL #2   Title  Pt will demonstrate atleast a 9 point improvement in her UEFI to reflect and improvement in her UE functional use.     Baseline  01/07/18: UEFI decreased from 28/80 to 10/80    Period  Weeks    Status  On-going    Target Date  03/04/18      PT LONG TERM GOAL #3   Title  Pt will demo atleast 155 deg of active Rt shoulder flexion with minimal shoulder shrug to allow her to reach over head into her cabinet without difficulty.     Baseline  165 deg, pain free    Time  12    Status  Achieved      PT LONG TERM GOAL #4   Title  Pt will report atleast 50% decrease in her pain and stiffness from the start of PT to allow for improved quality of life and daily activity tolerance.     Baseline  01/07/18: met    Time  12    Period  Weeks    Status  Achieved      PT LONG TERM GOAL #5   Title  Pt will demo improved shoulder strength and endurance evident by her ability to maintain RUE elevated above her head for up to 10 min, and her report of being able to successfully wash her hair without difficulty.     Baseline  01/07/18: <2 min today; revised goal to 5 min    Time  12    Period  Weeks    Status  Not Met      PT LONG TERM GOAL #6   Title  Pt will demo improved RUE strength to atleast 4/5 MMT which will allow her to lift small cans/objects overhead into her cabinet at home.     Baseline  01/07/18: 3/5    Status  On-going    Target Date  03/04/18      PT LONG TERM GOAL #7   Title  Pt will demo improved shoulder strength and endurance evident by her ability to maintain RUE elevated above her head for up to 5 min, and her report of being able to successfully wash her  hair without difficulty.     Status  New    Target Date  03/04/18            Plan - 02/17/18 1552    Clinical Impression Statement  Pt denies any pain today.  Pt tolerated session for strength well today.  Pt continues to require scapular cueing to reduce substitution with all exercise today.  Pt with limited functional strength of the Rt UE and continues to work on HEP to build strength at home.  Pt will continue to benefit from skilled PT for Rt shoulder strength, endurance and flexibility.  Upper extremity functional index score is 33/80 today, improved from intial and reassessment indicating improved UE functional use.  Pt will continue to benefit from skilled PT for Rt UE strength, endurance and flexibility.      Rehab Potential  Fair    Clinical  Impairments Affecting Rehab Potential  (-) limited visits due to insurance     PT Frequency  1x / week    PT Duration  8 weeks    PT Treatment/Interventions  ADLs/Self Care Home Management;Cryotherapy;Electrical Stimulation;Moist Heat;Iontophoresis 75m/ml Dexamethasone;Therapeutic activities;Therapeutic exercise;Patient/family education;Orthotic Fit/Training;Neuromuscular re-education;Manual techniques;Taping;Dry needling;Passive range of motion;Scar mobilization    PT Next Visit Plan  Rt shoulder strength with emphasis on scapular stabilization/depression, endurance tasks    Consulted and Agree with Plan of Care  Patient       Patient will benefit from skilled therapeutic intervention in order to improve the following deficits and impairments:  Decreased activity tolerance, Impaired flexibility, Impaired UE functional use, Hypomobility, Decreased strength, Decreased range of motion, Decreased endurance, Decreased coordination, Decreased scar mobility, Increased muscle spasms, Postural dysfunction, Pain, Improper body mechanics  Visit Diagnosis: Muscle weakness (generalized)  Stiffness of right shoulder, not elsewhere classified  Acute  pain of right shoulder  Abnormal posture     Problem List Patient Active Problem List   Diagnosis Date Noted  . S/P arthroscopy of right shoulder 08/13/2017  . Genetic testing 05/29/2017  . Family history of breast cancer   . Hyperlipidemia LDL goal <70 12/30/2015  . Cerebrovascular disease or lesion 12/30/2015  . Mild pulmonary hypertension ([place] 12/30/2015  . TIA (transient ischemic attack) 12/28/2015  . GERD (gastroesophageal reflux disease) 07/23/2015  . Chronic pain 07/23/2015  . Obese 07/19/2015  . S/P left THA, AA 07/17/2015  . 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 Insomnia 03/03/2011  . OSA (obstructive sleep apnea) 03/03/2011  . Depression, major, recurrent ([place] 12/02/2010  . CONSTIPATION, SLOW TRANSIT 10/04/2010  . IRRITABLE BOWEL SYNDROME 08/14/2009  . CYSTITIS, CHRONIC INTERSTITIAL 06/27/2009  . UNSPECIFIED HYPOTHYROIDISM 05/16/2009  . LIPOMA OF OTHER SPECIFIED SITES 04/13/2009  . BRUXISM 04/13/2009  . SYNCOPE 03/13/2009  . Abnormal finding on MRI of brain 02/07/2009  . MUSCLE WEAKNESS (GENERALIZED) 12/20/2008  . UNSPECIFIED ALLERGIC ALVEOLITIS AND PNEUMONITIS 08/02/2008  . HIP PAIN, LEFT, CHRONIC 07/04/2008  . MALAISE AND FATIGUE 07/04/2008  . LOW BACK PAIN 05/26/2008  . PROTEINURIA 12/23/2007  . HYPERCALCEMIA 11/18/2007  . Essential hypertension 09/14/2007  . ANEMIA, B12 DEFICIENCY 05/18/2007  . 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 ALLERGIC RHINITIS 03/29/2007  . FIBROMYALGIA 03/29/2007   KSigurd Sos PT 02/17/18 4:11 PM  [facility] [address] [place] [address]Phone: [phone]  Fax:  [phone] Name: Kristin DRUMMERMRN: [id number] of Birth: 918-Jun-1957

## 2018-02-24 ENCOUNTER — Encounter: Payer: Medicaid Other | Admitting: Physical Therapy

## 2018-03-03 ENCOUNTER — Ambulatory Visit: Payer: Medicaid Other

## 2018-03-11 ENCOUNTER — Ambulatory Visit: Payer: Medicaid Other | Attending: Specialist | Admitting: Physical Therapy

## 2018-03-11 ENCOUNTER — Encounter: Payer: Self-pay | Admitting: Physical Therapy

## 2018-03-11 DIAGNOSIS — M25611 Stiffness of right shoulder, not elsewhere classified: Secondary | ICD-10-CM | POA: Diagnosis present

## 2018-03-11 DIAGNOSIS — M6281 Muscle weakness (generalized): Secondary | ICD-10-CM | POA: Diagnosis present

## 2018-03-11 DIAGNOSIS — M25511 Pain in right shoulder: Secondary | ICD-10-CM | POA: Insufficient documentation

## 2018-03-11 DIAGNOSIS — R293 Abnormal posture: Secondary | ICD-10-CM | POA: Insufficient documentation

## 2018-03-11 NOTE — Therapy (Signed)
[facility] [address] [place], [place], [address] Phone: [phone]   Fax:  [phone]  Physical Therapy Treatment  Patient Details  Name: Kristin Pope MRN: [id number] Date of Birth: 11/27/1955 Referring Provider: Hart Robinsons, MD    Encounter Date: 03/11/2018  PT End of Session - 03/11/18 0942    Visit Number  26    Date for PT Re-Evaluation  03/04/18    Authorization Type  8 visits 02/21/18-03/18/18 Medicaid    Authorization Time Period  Medicaid approval:     Authorization - Visit Number  4    Authorization - Number of Visits  8    PT Start Time  0933    PT Stop Time  1012    PT Time Calculation (min)  39 min    Activity Tolerance  Patient tolerated treatment well;No increased pain    Behavior During Therapy  WFL for tasks assessed/performed       Past Medical History:  Diagnosis Date  . Allergic rhinitis   . Anemia, pernicious    b12 def.  . Anxiety   . Arthritis   . Biceps tendon tear    right  . Cervical spondylosis with radiculopathy    C2 -- C7  . Chronic fatigue   . Chronic pain    neck, back  . DDD (degenerative disc disease), cervical   . DDD (degenerative disc disease), lumbosacral   . Depression, major, recurrent ([place])   . Diverticulosis of colon   . Eczema   . Family history of adverse reaction to anesthesia    sister has problems waking up  . Family history of breast cancer   . Fibromyalgia 03/29/2007  . GERD (gastroesophageal reflux disease)   . Headache    constant headaches  . History of colonic diverticulitis 08/16/2010   w/ perforation (lower GI bleed)--- resolved without surgerical intervention  . 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 Hx of pyelonephritis 09/2005   due to UTI  . Hypertension   . IBS (irritable bowel syndrome)    internates between constpation/ diarrhea  . Lumbosacral  spondylosis    L2-3, L4-5  . Mild obstructive sleep apnea    per study 06/ 2009 mild osa  AHI 13/hr---  recommendation given mouth appliance, loss wt., cpap  . Mixed hyperlipidemia   . OA (osteoarthritis)    right shoulder AC joint  . Pre-diabetes   . Right rotator cuff tear     Past Surgical History:  Procedure Laterality Date  . BREAST SURGERY     breast biopsy-benign  . COLONOSCOPY  last one 08-08-2009  . DILATATION & CURRETTAGE/HYSTEROSCOPY WITH RESECTOCOPE  02-03-2011   dr Dellis Filbert  [facility]   polypectomy  . RADIAL OPTIC NEUROTOMY     twice in lumbar area of back-every 6 months  . SHOULDER ARTHROSCOPY WITH ROTATOR CUFF REPAIR Right 08/13/2017   Procedure: RIGHT SHOULDER ARTHROSCOPY, DEBRIDEMENT, BICEPS TENOTOMY, ROTATOR CUFF REPAIR, DISTAL CLAVICLE RESECTION;  Surgeon: Sydnee Cabal, MD;  Location: [facility];  Service: Orthopedics;  Laterality: Right;  . TOTAL HIP ARTHROPLASTY Left 07/17/2015   Procedure: LEFT TOTAL HIP ARTHROPLASTY ANTERIOR APPROACH;  Surgeon: Paralee Cancel, MD;  [facility];  Service: Orthopedics;  Laterality: Left;  . TOTAL HIP ARTHROPLASTY Right 08/21/2015   Procedure: RIGHT TOTAL HIP ARTHROPLASTY ANTERIOR APPROACH;  Surgeon: Paralee Cancel, MD;  [facility];  Service:  Orthopedics;  Laterality: Right;  . TRANSTHORACIC ECHOCARDIOGRAM  12/30/2015   ef 16-07%, grade 1 diastolic dysfunction/  mild MR/ trivial TR    There were no vitals filed for this visit.  Subjective Assessment - 03/11/18 0936    Subjective  Pt reports that she saw Dr Rosana Berger this past monday and she has had alot of pain relief after he gave her an injection. She still has trouble reaching up overhead and it gets tired after fixing her hair.     Currently in Pain?  No/denies                       [facility] Adult PT Treatment/Exercise - 03/11/18 0001      Shoulder Exercises: Supine   Flexion  Both;10 reps;Weights x2 sets     Shoulder Flexion Weight (lbs)  2       Shoulder Exercises: Standing   Internal Rotation  Right;15 reps x2 sets    Theraband Level (Shoulder Internal Rotation)  Level 2 (Red)    Other Standing Exercises  RUE horizontal abduction with yellow TB x10 reps (bicep compensation noted)    Other Standing Exercises  RUE cone stacking on second shelf x20 reps       Shoulder Exercises: ROM/Strengthening   Other ROM/Strengthening Exercises  Nustep L6 x10 min (untimed)       Shoulder Exercises: Power [employer]  20 reps    Row Limitations  25# x1 set, increased to 35 x10 reps for second set     Other Power [employer] Exercises  B bicep curls 25# 2x10 reps     Other Power Tower Exercises  B tricep extension attempted with 35#, dropped to [employer] reps (heavy therapist cuing)              PT Education - 03/11/18 1014    Education provided  Yes    Education Details  upcoming discharge and plan to update HEP    Person(s) Educated  Patient    Methods  Explanation    Comprehension  Verbalized understanding       PT Short Term Goals - 01/07/18 1524      PT SHORT TERM GOAL #1   Title  Pt will demo consistency and independence with her HEP to decrease inflammation and improve shoulder ROM.    Time  4    Period  Weeks    Status  Achieved      PT SHORT TERM GOAL #2   Title  Pt will demo full Rt shoulder PROM into flexion and abduction to 160 deg, Rt shoulder IR atleast 60 deg to allow for optimal strengthening progression.     Status  Achieved      PT SHORT TERM GOAL #3   Title  Pt will demo pain AAROM in an upright postion with little to no pain and atleast 160 deg of shoulder elevation.    Status  Achieved      PT SHORT TERM GOAL #4   Title  Pts Rt grip strength will be atleast 5lb stronger than on the Lt, to allow for her to complete more fine motor activities in the kitchen with minimal difficulty.     Baseline  improved by 6lb on the Rt, still 3 lb weaker than the Lt    Status  Not Met        PT Long Term Goals -  01/07/18 1524      PT LONG  TERM GOAL #1   Title  Pt will be independent in advanced HEP to allow for continuous progression towards her goals.     Baseline  01/07/18: HEP ongoing, plan to update to focus on functional strength    Period  Weeks    Status  On-going    Target Date  03/04/18      PT LONG TERM GOAL #2   Title  Pt will demonstrate atleast a 9 point improvement in her UEFI to reflect and improvement in her UE functional use.     Baseline  01/07/18: UEFI decreased from 28/80 to 10/80    Period  Weeks    Status  On-going    Target Date  03/04/18      PT LONG TERM GOAL #3   Title  Pt will demo atleast 155 deg of active Rt shoulder flexion with minimal shoulder shrug to allow her to reach over head into her cabinet without difficulty.     Baseline  165 deg, pain free    Time  12    Status  Achieved      PT LONG TERM GOAL #4   Title  Pt will report atleast 50% decrease in her pain and stiffness from the start of PT to allow for improved quality of life and daily activity tolerance.     Baseline  01/07/18: met    Time  12    Period  Weeks    Status  Achieved      PT LONG TERM GOAL #5   Title  Pt will demo improved shoulder strength and endurance evident by her ability to maintain RUE elevated above her head for up to 10 min, and her report of being able to successfully wash her hair without difficulty.     Baseline  01/07/18: <2 min today; revised goal to 5 min    Time  12    Period  Weeks    Status  Not Met      PT LONG TERM GOAL #6   Title  Pt will demo improved RUE strength to atleast 4/5 MMT which will allow her to lift small cans/objects overhead into her cabinet at home.     Baseline  01/07/18: 3/5    Status  On-going    Target Date  03/04/18      PT LONG TERM GOAL #7   Title  Pt will demo improved shoulder strength and endurance evident by her ability to maintain RUE elevated above her head for up to 5 min, and her report of being able to successfully wash her hair  without difficulty.     Status  New    Target Date  03/04/18            Plan - 03/11/18 0944    Clinical Impression Statement  Pt arrived noting improved pain in the Rt bicep region following an injection from her surgeon. Session continued to address deficits in Rt shoulder strength and endurance, noting shoulder shrug compensations during endurance reaching activity. Pt reported no increase in pain, but did note Rt shoulder fatigue end of this session. Will plan for discharge next session to allow for pt to further work on deficits on her own.      Rehab Potential  Fair    Clinical Impairments Affecting Rehab Potential  (-) limited visits due to insurance     PT Frequency  1x / week    PT Duration  8 weeks  PT Treatment/Interventions  ADLs/Self Care Home Management;Cryotherapy;Electrical Stimulation;Moist Heat;Iontophoresis 93m/ml Dexamethasone;Therapeutic activities;Therapeutic exercise;Patient/family education;Orthotic Fit/Training;Neuromuscular re-education;Manual techniques;Taping;Dry needling;Passive range of motion;Scar mobilization    PT Next Visit Plan  update HEP, discharge    Consulted and Agree with Plan of Care  Patient       Patient will benefit from skilled therapeutic intervention in order to improve the following deficits and impairments:  Decreased activity tolerance, Impaired flexibility, Impaired UE functional use, Hypomobility, Decreased strength, Decreased range of motion, Decreased endurance, Decreased coordination, Decreased scar mobility, Increased muscle spasms, Postural dysfunction, Pain, Improper body mechanics  Visit Diagnosis: Muscle weakness (generalized)  Stiffness of right shoulder, not elsewhere classified  Acute pain of right shoulder  Abnormal posture     Problem List Patient Active Problem List   Diagnosis Date Noted  . S/P arthroscopy of right shoulder 08/13/2017  . Genetic testing 05/29/2017  . Family history of breast cancer   .  Hyperlipidemia LDL goal <70 12/30/2015  . Cerebrovascular disease or lesion 12/30/2015  . Mild pulmonary hypertension ([place] 12/30/2015  . TIA (transient ischemic attack) 12/28/2015  . GERD (gastroesophageal reflux disease) 07/23/2015  . Chronic pain 07/23/2015  . Obese 07/19/2015  . S/P left THA, AA 07/17/2015  . 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 Insomnia 03/03/2011  . OSA (obstructive sleep apnea) 03/03/2011  . Depression, major, recurrent ([place] 12/02/2010  . CONSTIPATION, SLOW TRANSIT 10/04/2010  . IRRITABLE BOWEL SYNDROME 08/14/2009  . CYSTITIS, CHRONIC INTERSTITIAL 06/27/2009  . UNSPECIFIED HYPOTHYROIDISM 05/16/2009  . LIPOMA OF OTHER SPECIFIED SITES 04/13/2009  . BRUXISM 04/13/2009  . SYNCOPE 03/13/2009  . Abnormal finding on MRI of brain 02/07/2009  . MUSCLE WEAKNESS (GENERALIZED) 12/20/2008  . UNSPECIFIED ALLERGIC ALVEOLITIS AND PNEUMONITIS 08/02/2008  . HIP PAIN, LEFT, CHRONIC 07/04/2008  . MALAISE AND FATIGUE 07/04/2008  . LOW BACK PAIN 05/26/2008  . PROTEINURIA 12/23/2007  . HYPERCALCEMIA 11/18/2007  . Essential hypertension 09/14/2007  . ANEMIA, B12 DEFICIENCY 05/18/2007  . 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 ALLERGIC RHINITIS 03/29/2007  . FIBROMYALGIA 03/29/2007   10:29 AM,03/11/18 SSherol DadePT, DPT [facility] [address] [place] [address]Phone: [phone]  Fax:  [phone] Name: GDENIELLE BAYARDMRN: [id number] of Birth: [birth year]

## 2018-03-17 ENCOUNTER — Encounter: Payer: Self-pay | Admitting: Physical Therapy

## 2018-03-17 ENCOUNTER — Ambulatory Visit: Payer: Medicaid Other | Admitting: Physical Therapy

## 2018-03-17 DIAGNOSIS — M6281 Muscle weakness (generalized): Secondary | ICD-10-CM | POA: Diagnosis not present

## 2018-03-17 DIAGNOSIS — M25611 Stiffness of right shoulder, not elsewhere classified: Secondary | ICD-10-CM

## 2018-03-17 DIAGNOSIS — R293 Abnormal posture: Secondary | ICD-10-CM

## 2018-03-17 DIAGNOSIS — M25511 Pain in right shoulder: Secondary | ICD-10-CM

## 2018-03-18 ENCOUNTER — Encounter: Payer: Self-pay | Admitting: Physical Therapy

## 2018-03-18 NOTE — Therapy (Signed)
[facility] [address], [place], [place], [address] Phone: [phone]   Fax:  [phone]  Physical Therapy Treatment  Patient Details  Name: Kristin Pope MRN: [id number] Date of Birth: November 02, 1955 Referring Provider: Hart Robinsons, MD   Encounter Date: 03/17/2018  PT End of Session - 03/18/18 0753    Visit Number  27    Date for PT Re-Evaluation  03/04/18    Authorization Type  8 visits 02/21/18-03/18/18 Medicaid    Authorization Time Period  Medicaid approval:     Authorization - Visit Number  5    Authorization - Number of Visits  8    PT Start Time  2836    PT Stop Time  6294    PT Time Calculation (min)  35 min    Activity Tolerance  Patient tolerated treatment well;No increased pain    Behavior During Therapy  WFL for tasks assessed/performed       Past Medical History:  Diagnosis Date  . Allergic rhinitis   . Anemia, pernicious    b12 def.  . Anxiety   . Arthritis   . Biceps tendon tear    right  . Cervical spondylosis with radiculopathy    C2 -- C7  . Chronic fatigue   . Chronic pain    neck, back  . DDD (degenerative disc disease), cervical   . DDD (degenerative disc disease), lumbosacral   . Depression, major, recurrent ([place])   . Diverticulosis of colon   . Eczema   . Family history of adverse reaction to anesthesia    sister has problems waking up  . Family history of breast cancer   . Fibromyalgia 03/29/2007  . GERD (gastroesophageal reflux disease)   . Headache    constant headaches  . History of colonic diverticulitis 08/16/2010   w/ perforation (lower GI bleed)--- resolved without surgerical intervention  . 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 Hx of pyelonephritis 09/2005   due to UTI  . Hypertension   . IBS (irritable bowel syndrome)    internates between constpation/ diarrhea  . Lumbosacral  spondylosis    L2-3, L4-5  . Mild obstructive sleep apnea    per study 06/ 2009 mild osa  AHI 13/hr---  recommendation given mouth appliance, loss wt., cpap  . Mixed hyperlipidemia   . OA (osteoarthritis)    right shoulder AC joint  . Pre-diabetes   . Right rotator cuff tear     Past Surgical History:  Procedure Laterality Date  . BREAST SURGERY     breast biopsy-benign  . COLONOSCOPY  last one 08-08-2009  . DILATATION & CURRETTAGE/HYSTEROSCOPY WITH RESECTOCOPE  02-03-2011   dr Dellis Filbert  [facility] [place]   polypectomy  . RADIAL OPTIC NEUROTOMY     twice in lumbar area of back-every 6 months  . SHOULDER ARTHROSCOPY WITH ROTATOR CUFF REPAIR Right 08/13/2017   Procedure: RIGHT SHOULDER ARTHROSCOPY, DEBRIDEMENT, BICEPS TENOTOMY, ROTATOR CUFF REPAIR, DISTAL CLAVICLE RESECTION;  Surgeon: Sydnee Cabal, MD;  Location: [facility];  Service: Orthopedics;  Laterality: Right;  . TOTAL HIP ARTHROPLASTY Left 07/17/2015   Procedure: LEFT TOTAL HIP ARTHROPLASTY ANTERIOR APPROACH;  Surgeon: Paralee Cancel, MD;  Location: [facility];  Service: Orthopedics;  Laterality: Left;  . TOTAL HIP ARTHROPLASTY Right 08/21/2015   Procedure: RIGHT TOTAL HIP ARTHROPLASTY ANTERIOR APPROACH;  Surgeon: Paralee Cancel, MD;  Location: [facility];  Service: Orthopedics;  Laterality: Right;  . TRANSTHORACIC ECHOCARDIOGRAM  12/30/2015   ef 23-76%, grade 1 diastolic dysfunction/  mild MR/ trivial TR    There were no vitals filed for this visit.  Subjective Assessment - 03/17/18 1524    Subjective  Pt reports that things have been going ok. She was sore after her last session but this is better now. She has been too tired to really work on her exercises.     Currently in Pain?  No/denies         [facility] PT Assessment - 03/18/18 0001      Assessment   Medical Diagnosis  Rt shoulder scope/RTC repair/biceps tenotomy    Onset Date/Surgical Date  08/13/17    Hand Dominance  Right    Next MD Visit  as needed     Prior Therapy   prior to surgery      Precautions   Precautions  Shoulder    Type of Shoulder Precautions  Rotator cuff repair     Precaution Comments  Sling discontinued      Restrictions   Weight Bearing Restrictions  No      [place] residence      Prior Function   Level of Independence  Independent      Observation/Other Assessments   Upper Extremity Functional Index   28/80      AROM   Right Shoulder Extension  45 Degrees    Right Shoulder Flexion  180 Degrees pain free    Right Shoulder ABduction  180 Degrees mild discomfort end range     Right Shoulder Internal Rotation  -- able reach opposite scapula    Right Shoulder External Rotation  -- able to reach opposite scapula       Strength   Overall Strength Comments  Rt 45 lb, L 40 lb    Right Shoulder Flexion  3/5 Lt 4/5 MMT    Right Shoulder ABduction  3/5 Lt: 4/5 MMT    Right Shoulder Internal Rotation  4/5 Lt: 4/5 MMT    Right Shoulder External Rotation  3/5 Lt: 4/5 MMT                    [facility] Adult PT Treatment/Exercise - 03/18/18 0001      Posture/Postural Control   Posture Comments  rounded shoulders, forward head      Shoulder Exercises: Supine   Other Supine Exercises  D2 flexion RUE with yellow TB x10 reps, unable to complete in sitting       bicep curls with blue TB RT only x10 reps tricep extension with blue TB, Rt only x10 reps  Rows with blue x15 reps       PT Education - 03/18/18 0752    Education provided  Yes    Education Details  remaining deficits and importance of increasing adherence with her HEP; importance of completing exercises provided rather than making up her own exercises or just focusing on one muscle; HEP reviewed     Person(s) Educated  Patient    Methods  Explanation;Handout;Demonstration;Verbal cues    Comprehension  Verbalized understanding;Returned demonstration       PT Short Term Goals - 03/17/18 1534      PT SHORT TERM GOAL #1    Title  Pt will demo consistency and independence with her HEP to decrease inflammation and improve shoulder ROM.    Time  4    Period  Weeks  Status  Achieved      PT SHORT TERM GOAL #2   Title  Pt will demo full Rt shoulder PROM into flexion and abduction to 160 deg, Rt shoulder IR atleast 60 deg to allow for optimal strengthening progression.     Status  Achieved      PT SHORT TERM GOAL #3   Title  Pt will demo pain AAROM in an upright postion with little to no pain and atleast 160 deg of shoulder elevation.    Status  Achieved      PT SHORT TERM GOAL #4   Title  Pts Rt grip strength will be atleast 5lb stronger than on the Lt, to allow for her to complete more fine motor activities in the kitchen with minimal difficulty.     Baseline  45 lb on Rt, 40 Lb on Lt    Status  Achieved        PT Long Term Goals - 03/17/18 1535      PT LONG TERM GOAL #1   Title  Pt will be independent in advanced HEP to allow for continuous progression towards her goals.     Baseline  pt not completing HEP    Period  Weeks    Status  On-going      PT LONG TERM GOAL #2   Title  Pt will demonstrate atleast a 9 point improvement in her UEFI to reflect and improvement in her UE functional use.     Baseline  01/07/18: UEFI decreased from 28/80 to 10/80    Period  Weeks    Status  On-going      PT LONG TERM GOAL #3   Title  Pt will demo atleast 155 deg of active Rt shoulder flexion with minimal shoulder shrug to allow her to reach over head into her cabinet without difficulty.     Time  12    Status  Achieved      PT LONG TERM GOAL #4   Title  Pt will report atleast 50% decrease in her pain and stiffness from the start of PT to allow for improved quality of life and daily activity tolerance.     Baseline  01/07/18: met    Time  12    Period  Weeks    Status  Achieved      PT LONG TERM GOAL #5   Title  Pt will demo improved shoulder strength and endurance evident by her ability to maintain RUE  elevated above her head for up to 10 min, and her report of being able to successfully wash her hair without difficulty.     Baseline  up to 2 min     Time  12    Period  Weeks    Status  Partially Met      PT LONG TERM GOAL #6   Title  Pt will demo improved RUE strength to atleast 4/5 MMT which will allow her to lift small cans/objects overhead into her cabinet at home.     Baseline  3/5 MMT    Status  Partially Met      PT LONG TERM GOAL #7   Title  Pt will demo improved shoulder strength and endurance evident by her ability to maintain RUE elevated above her head for up to 5 min, and her report of being able to successfully wash her hair without difficulty.     Baseline  2 minutes     Status  Not  Met            Plan - 03/18/18 0753    Clinical Impression Statement  Pt was discharged this visit having met all of her short term goals, demonstrating full active and passive Rt shoulder ROM and grip strength within normal limits. She has met 50% of her long term goals, with remaining limitations in Rt shoulder strength at 3/5 MMT, poor Rt shoulder endurance and intermittent pain with RUE use. Despite therapist efforts to regularly update HEP and encourage adherence to her program, pt has been non-compliant with her strengthening and endurance exercises at home, leading to slow progress overall. She has reached the maximum visits for therapy at this time and is being discharged with a detailed HEP that the therapist reviewed with her and encouraged her to further address her limitations at home.    Rehab Potential  Fair    Clinical Impairments Affecting Rehab Potential  (-) limited visits due to insurance     PT Frequency  1x / week    PT Duration  8 weeks    PT Treatment/Interventions  ADLs/Self Care Home Management;Cryotherapy;Electrical Stimulation;Moist Heat;Iontophoresis 58m/ml Dexamethasone;Therapeutic activities;Therapeutic exercise;Patient/family education;Orthotic  Fit/Training;Neuromuscular re-education;Manual techniques;Taping;Dry needling;Passive range of motion;Scar mobilization    PT Next Visit Plan  discharged    Consulted and Agree with Plan of Care  Patient       Patient will benefit from skilled therapeutic intervention in order to improve the following deficits and impairments:  Decreased activity tolerance, Impaired flexibility, Impaired UE functional use, Hypomobility, Decreased strength, Decreased range of motion, Decreased endurance, Decreased coordination, Decreased scar mobility, Increased muscle spasms, Postural dysfunction, Pain, Improper body mechanics  Visit Diagnosis: Muscle weakness (generalized)  Stiffness of right shoulder, not elsewhere classified  Acute pain of right shoulder  Abnormal posture     Problem List Patient Active Problem List   Diagnosis Date Noted  . S/P arthroscopy of right shoulder 08/13/2017  . Genetic testing 05/29/2017  . Family history of breast cancer   . Hyperlipidemia LDL goal <70 12/30/2015  . Cerebrovascular disease or lesion 12/30/2015  . Mild pulmonary hypertension ([place] 12/30/2015  . TIA (transient ischemic attack) 12/28/2015  . GERD (gastroesophageal reflux disease) 07/23/2015  . Chronic pain 07/23/2015  . Obese 07/19/2015  . S/P left THA, AA 07/17/2015  . 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 Insomnia 03/03/2011  . OSA (obstructive sleep apnea) 03/03/2011  . Depression, major, recurrent ([place] 12/02/2010  . CONSTIPATION, SLOW TRANSIT 10/04/2010  . IRRITABLE BOWEL SYNDROME 08/14/2009  . CYSTITIS, CHRONIC INTERSTITIAL 06/27/2009  . UNSPECIFIED HYPOTHYROIDISM 05/16/2009  . LIPOMA OF OTHER SPECIFIED SITES 04/13/2009  . BRUXISM 04/13/2009  . SYNCOPE 03/13/2009  . Abnormal finding on MRI of brain 02/07/2009  . MUSCLE WEAKNESS (GENERALIZED) 12/20/2008  . UNSPECIFIED ALLERGIC ALVEOLITIS AND PNEUMONITIS 08/02/2008  . HIP PAIN, LEFT, CHRONIC 07/04/2008  . MALAISE AND FATIGUE  07/04/2008  . LOW BACK PAIN 05/26/2008  . PROTEINURIA 12/23/2007  . HYPERCALCEMIA 11/18/2007  . Essential hypertension 09/14/2007  . ANEMIA, B12 DEFICIENCY 05/18/2007  . 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 ALLERGIC RHINITIS 03/29/2007  . FIBROMYALGIA 03/29/2007    PHYSICAL THERAPY DISCHARGE SUMMARY  Visits from Start of Care: 27  Current functional level related to goals / functional outcomes: See above for more details    Remaining deficits: See above for more details    Education / Equipment: See above for more details  Plan: Patient agrees to discharge.  Patient goals were partially  met. Patient is being discharged due to financial reasons.  ?????      1:58 PM,03/18/18 Sherol Dade PT, DPT [place] [facility] [place]  [facility] [address] [place], [place], [address] Phone: [phone]   Fax:  [phone]  Name: ELLEANOR GUYETT MRN: [id number] Date of Birth: 11-13-1955

## 2018-04-16 DIAGNOSIS — R519 Headache, unspecified: Secondary | ICD-10-CM

## 2018-04-16 DIAGNOSIS — E871 Hypo-osmolality and hyponatremia: Secondary | ICD-10-CM

## 2018-04-16 HISTORY — DX: Hypo-osmolality and hyponatremia: E87.1

## 2018-04-16 HISTORY — DX: Headache, unspecified: R51.9

## 2018-04-26 ENCOUNTER — Ambulatory Visit: Payer: Medicaid Other | Admitting: Neurology

## 2018-04-26 ENCOUNTER — Encounter: Payer: Self-pay | Admitting: Neurology

## 2018-04-26 VITALS — BP 131/80 | HR 71 | Ht 59.0 in | Wt 149.5 lb

## 2018-04-26 DIAGNOSIS — R413 Other amnesia: Secondary | ICD-10-CM

## 2018-04-26 DIAGNOSIS — G4489 Other headache syndrome: Secondary | ICD-10-CM

## 2018-04-26 MED ORDER — ERENUMAB-AOOE 140 MG/ML ~~LOC~~ SOAJ: 140.0000 mg | SUBCUTANEOUS | 3 refills | Status: DC

## 2018-04-26 NOTE — Patient Instructions (Signed)
   We will get MRI of the brain and start Aimovig for the headache.

## 2018-04-26 NOTE — Progress Notes (Signed)
Reason for visit: Headache, memory disturbance  Referring physician: Dr. Deloria [name] is a 62 y.o. female  History of present illness:  Kristin Pope is a 62 year old right-handed white female with a history of chronic fatigue syndrome and fibromyalgia who reports a long-standing history of headaches.  The patient has had headaches since her early 76s, but the headaches have become daily in nature over the last 1 year.  The patient indicates that the headaches tend to be in the frontal area but she also has neck pain and some pain, from the back of the head.  She reports some photophobia and phonophobia, she denies any nausea or vomiting.  She oftentimes will take Tylenol extra strength for the headache.  She denies any focal numbness or weakness of the face, arms, legs.  She does occasionally have some slight urinary incontinence, she denies any balance issues.  Over the last 6 months she has had some increasing problems with word finding, short-term memory problems.  The patient is still able to operate a motor vehicle, she manages her finances, she has mild difficulties keeping up with medications and appointments.  She has not had to stop any activities of daily living because of her memory problems.  The patient states that her mother, paternal grandmother and maternal grandmother all had dementia as they got older.  The patient comes to this office for an evaluation.  Currently, she is on a multitude of medications that can be used for headache control.  She takes Wellbutrin intermittently, she has a prescription for Celebrex, she takes doxepin 50 mg at night, Cymbalta 60 mg daily, gabapentin 300 mg at night, Robaxin if needed, she takes nadolol as a beta-blocker and tizanidine 2 mg up to 4 times a day if needed.  The patient has taken baclofen as well.  Past Medical History:  Diagnosis Date  . Allergic rhinitis   . Anemia, pernicious    b12 def.  . Anxiety   . Arthritis   .  Biceps tendon tear    right  . Cervical spondylosis with radiculopathy    C2 -- C7  . Chronic fatigue   . Chronic pain    neck, back  . DDD (degenerative disc disease), cervical   . DDD (degenerative disc disease), lumbosacral   . Depression, major, recurrent ([place])   . Diverticulosis of colon   . Eczema   . Family history of adverse reaction to anesthesia    sister has problems waking up  . Family history of breast cancer   . Fibromyalgia 03/29/2007  . GERD (gastroesophageal reflux disease)   . Headache    constant headaches  . History of colonic diverticulitis 08/16/2010   w/ perforation (lower GI bleed)--- resolved without surgerical intervention  . 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 Hx of pyelonephritis 09/2005   due to UTI  . Hypertension   . IBS (irritable bowel syndrome)    internates between constpation/ diarrhea  . Lumbosacral spondylosis    L2-3, L4-5  . Mild obstructive sleep apnea    per study 06/ 2009 mild osa  AHI 13/hr---  recommendation given mouth appliance, loss wt., cpap  . Mixed hyperlipidemia   . OA (osteoarthritis)    right shoulder AC joint  . Pre-diabetes   . Right rotator cuff tear     Past Surgical History:  Procedure Laterality Date  . BREAST SURGERY  breast biopsy-benign  . COLONOSCOPY  last one 08-08-2009  . DILATATION & CURRETTAGE/HYSTEROSCOPY WITH RESECTOCOPE  02-03-2011   dr Dellis Filbert  [facility]   polypectomy  . RADIAL KERATOTOMY    . RADIAL OPTIC NEUROTOMY     twice in lumbar area of back-every 6 months  . SHOULDER ARTHROSCOPY WITH ROTATOR CUFF REPAIR Right 08/13/2017   Procedure: RIGHT SHOULDER ARTHROSCOPY, DEBRIDEMENT, BICEPS TENOTOMY, ROTATOR CUFF REPAIR, DISTAL CLAVICLE RESECTION;  Surgeon: Sydnee Cabal, MD;  Location: [facility];  Service: Orthopedics;  Laterality: Right;  . TOTAL HIP ARTHROPLASTY Left 07/17/2015   Procedure: LEFT TOTAL  HIP ARTHROPLASTY ANTERIOR APPROACH;  Surgeon: Paralee Cancel, MD;  Location: [facility];  Service: Orthopedics;  Laterality: Left;  . TOTAL HIP ARTHROPLASTY Right 08/21/2015   Procedure: RIGHT TOTAL HIP ARTHROPLASTY ANTERIOR APPROACH;  Surgeon: Paralee Cancel, MD;  Location: [facility];  Service: Orthopedics;  Laterality: Right;  . TRANSTHORACIC ECHOCARDIOGRAM  12/30/2015   ef 16-10%, grade 1 diastolic dysfunction/  mild MR/ trivial TR    Family History  Problem Relation Age of Onset  . Heart disease Mother   . Heart attack Mother   . Asthma Mother   . Breast cancer Mother 15  . Other Father        car accident  . Breast cancer Sister 77  . Lung cancer Sister   . Other Sister        ATM mutation  . Heart attack Brother 86  . Coronary artery disease Brother   . Breast cancer Maternal Grandmother   . Rheum arthritis Maternal Grandmother   . Stroke Maternal Grandfather   . Brain cancer Paternal Grandfather   . Lung cancer Paternal Grandfather   . Breast cancer Maternal Aunt 34  . Alzheimer's disease Paternal Grandmother   . Testicular cancer Cousin 28  . Other Other        ATM mutation    Social history:  reports that she quit smoking about 33 years ago. Her smoking use included cigarettes. She quit after 11.00 years of use. She has never used smokeless tobacco. She reports that she drinks alcohol. She reports that she does not use drugs.  Medications:  Prior to Admission medications   Medication Sig Start Date End Date Taking? Authorizing Provider  acetaminophen (TYLENOL) 650 MG CR tablet Take 1,300 mg every 8 (eight) hours as needed by mouth for pain.   Yes [provider]  aspirin 325 MG tablet Take 1 tablet (325 mg total) by mouth daily. 12/30/15  Yes Rama, Venetia Maxon, MD  baclofen (LIORESAL) 10 MG tablet Take 1 tablet (10 mg total) 3 (three) times daily by mouth. 08/13/17 08/13/18 Yes Stilwell, Bryson L, PA-C  celecoxib (CELEBREX) 100 MG capsule Take 100 mg by mouth daily as  needed (leg pain).   Yes [provider]  celecoxib (CELEBREX) 200 MG capsule Take 200 mg 2 (two) times daily as needed by mouth.   Yes [provider]  cephALEXin (KEFLEX) 500 MG capsule Take 1 capsule (500 mg total) 3 (three) times daily by mouth. 08/13/17  Yes Stilwell, Bryson L, PA-C  Chlorphen-Pseudoephed-APAP (ALLERGY SINUS-D MAX ST PO) Take 2 (two) times daily as needed by mouth (SINUS HEADACHE).   Yes [provider]  cholecalciferol (VITAMIN D) 1000 UNITS tablet Take 1,000 Units daily by mouth.    Yes [provider]  clonazePAM (KLONOPIN) 1 MG tablet Take 1 tablet (1 mg total) by mouth 4 (four) times daily. Patient taking differently: Take  1 mg by mouth 4 (four) times daily as needed for anxiety.  07/19/15  Yes Babish, Rodman Key, PA-C  cyanocobalamin (,VITAMIN B-12,) 1000 MCG/ML injection Inject 1,000 mcg See admin instructions into the muscle. Vitamin B12 - every month --  last injection approx 07-24-2017   Yes [provider]  Cyanocobalamin (VITAMIN B-12) 5000 MCG TBDP Take 5,000 mcg daily with lunch by mouth. Per pt varies 2068mg, 25070m, or 500066m  Yes [provider]  doxepin (SINEQUAN) 25 MG capsule Take 25-50 mg at bedtime by mouth.   Yes [provider]  DULoxetine (CYMBALTA) 60 MG capsule Take 60 mg by mouth at bedtime.  07/28/15  Yes [provider]  ezetimibe (ZETIA) 10 MG tablet Take 1 tablet (10 mg total) by mouth daily. Patient taking differently: Take 10 mg every evening by mouth.  12/30/15  Yes Rama, ChrVenetia MaxonD  fluticasone (FLONASE) 50 MCG/ACT nasal spray Place 1 spray as needed into both nostrils.  12/07/15  Yes [provider]  furosemide (LASIX) 20 MG tablet Take 20 mg by mouth every Monday, Wednesday, and Friday.   Yes [provider]  gabapentin (NEURONTIN) 300 MG capsule Take 300 mg at bedtime by mouth. PT ONLY TAKES AT BEDTIME--  STATED PRESCRIBED TID   Yes [provider]  GuaiFENesin (MUCINEX PO) Take 1 tablet by mouth 2 (two) times daily as needed (congestion from seasonal allergies).   Yes [provider]  hydrocortisone cream 1 % Apply 1 application topically at bedtime as needed for itching.    Yes [provider]  HYDROmorphone (DILAUDID) 2 MG tablet Take 1 tablet (2 mg total) every 4 (four) hours as needed by mouth for severe pain. 08/13/17 08/13/18 Yes Stilwell, Bryson L, PA-C  lubiprostone (AMITIZA) 24 MCG capsule Take 1 capsule (24 mcg total) by mouth daily with breakfast. Patient taking differently: Take 24 mcg by mouth at bedtime.  01/20/12  Yes JenRicard DillonD  montelukast (SINGULAIR) 10 MG tablet Take 10 mg by mouth at bedtime.    Yes [provider]  Multiple Vitamin (MULTIVITAMIN IRON-FREE) TABS Take daily by mouth.   Yes [provider]  nadolol (CORGARD) 40 MG tablet TAKE 1 TABLET EVERY DAY Patient taking differently: TAKE 1 TABLET EVERY DAY AT BEDTIME 12/17/12  Yes JenRicard DillonD  nitrofurantoin, macrocrystal-monohydrate, (MACROBID) 100 MG capsule Take 100 mg at bedtime by mouth. Continuous course 12/07/15  Yes [provider]  NUVIGIL 150 MG tablet TAKE 1 TABLET BY MOUTH EVERY DAY Patient taking differently: TAKE 1/2 TO 1 TABLET BY MOUTH 2 TIMES DAILY AS NEEDED FOR ENERGY BOOST 07/15/12  Yes JenRicard DillonD  omeprazole (PRILOSEC) 40 MG capsule Take 40 mg at bedtime by mouth.    Yes [provider]  pravastatin (PRAVACHOL) 40 MG tablet Take 40 mg every evening by mouth. Per pt has not taken in few months (on 08-10-2017)   Yes [provider]  TESTOSTERONE IM Inject 1 application See admin instructions into the muscle. [facility] 2 1/2 [facility] - last injection approx.  End of October 2018   Yes [provider]  zolpidem (AMBIEN) 10 MG tablet Take 10 mg by mouth at bedtime.    Yes [provider]        Allergies  Allergen Reactions  . Morphine And Related Other (See Comments)    Headaches - pt can take hydromorphone  . Codeine Other (  See Comments)    REACTION: Insomnia "crazy dreams"  . Fentanyl Nausea And Vomiting    Fentanyl patch - nausea and vomiting.  . Gluten Meal Other (See Comments)    Pt avoids eating gluten  . Hydrocodone Other (See Comments)    Urinary retention---  "can only take 1/2 tablet low dose"  . Lactose Intolerance (Gi) Other (See Comments)    Upset stomach  . Lipitor [Atorvastatin] Other (See Comments)    Pain, myalgias  . Monosodium Glutamate Other (See Comments)    MSG  --- Increases blood pressure  . Tetracycline Nausea And Vomiting  . Tramadol Other (See Comments)    "Passed out"    ROS:  Out of a complete 14 system review of symptoms, the patient complains only of the following symptoms, and all other reviewed systems are negative.  Fatigue Swelling in the legs Blurred vision, eye pain Snoring Moles Constipation Urination problems Anemia, easy bruising, easy bleeding Feeling hot, increased thirst, flushing Joint pain, aching muscles Allergies, runny nose, frequent infections Memory loss, confusion, headache, numbness, weakness, dizziness, tremor Depression, anxiety, too much sleep, decreased energy, change in appetite, disinterest in activities Insomnia, sleepiness  Blood pressure 131/80, pulse 71, height 4' 11"  (1.499 m), weight 149 lb 8 oz (67.8 kg), SpO2 98 %.  Physical Exam  General: The patient is alert and cooperative at the time of the examination.  The patient is moderately obese.  Eyes: Pupils are equal, round, and reactive to light. Discs are flat bilaterally.  Neck: The neck is supple, no carotid bruits are noted.  Respiratory: The respiratory examination is clear.  Cardiovascular: The cardiovascular examination reveals a regular rate and rhythm, no obvious murmurs or rubs are noted.  Skin: Extremities are without  significant edema.  Neurologic Exam  Mental status: The patient is alert and oriented x 3 at the time of the examination. The patient has apparent normal recent and remote memory, with an apparently normal attention span and concentration ability.  Mini-Mental status examination done today shows a total score 30/30.  Cranial nerves: Facial symmetry is present. There is good sensation of the face to pinprick and soft touch bilaterally. The strength of the facial muscles and the muscles to head turning and shoulder shrug are normal bilaterally. Speech is well enunciated, no aphasia or dysarthria is noted. Extraocular movements are full. Visual fields are full. The tongue is midline, and the patient has symmetric elevation of the soft palate. No obvious hearing deficits are noted.  Motor: The motor testing reveals 5 over 5 strength of all 4 extremities. Good symmetric motor tone is noted throughout.  Sensory: Sensory testing is intact to pinprick, soft touch, vibration sensation, and position sense on all 4 extremities, with exception some slight decrease in pinprick sensation on the right arm as compared to the left. No evidence of extinction is noted.  Coordination: Cerebellar testing reveals good finger-nose-finger and heel-to-shin bilaterally.  Gait and station: Gait is normal. Tandem gait is normal. Romberg is negative. No drift is seen.  Reflexes: Deep tendon reflexes are symmetric and normal bilaterally. Toes are downgoing bilaterally.   MRI brain and MRA head and neck 12/28/15:  IMPRESSION: 1. No acute intracranial abnormality. 2. Chronic left cerebellar infarct, new from 2010. 3. No major intracranial arterial occlusion or significant stenosis. 4. No significant arterial stenosis identified in the neck. Suboptimal evaluation of the proximal common carotid arteries. 5. Mild luminal regularity involving the right cervical ICA and right vertebral artery V3 segment, query  mild  fibromuscular dysplasia.   Assessment/Plan:  1.  Reported memory disturbance  2.  Chronic daily headaches  The patient will be set up for blood work today, she will undergo MRI of the brain given the memory issues that have started over the last 6 months.  The patient will be placed on Aimovig for her headache, she is on a multitude of oral medications currently.  The patient will follow-up in about 4 months.  We will follow the memory issues over time.  Jill Alexanders MD 04/26/2018 3:15 PM  [facility] [address] [place], [place] [address]  Phone [phone] Fax [phone]

## 2018-04-27 ENCOUNTER — Telehealth: Payer: Self-pay | Admitting: *Deleted

## 2018-04-27 LAB — C-REACTIVE PROTEIN: CRP: 4 mg/L (ref 0–10)

## 2018-04-27 LAB — RPR: RPR Ser Ql: NONREACTIVE

## 2018-04-27 LAB — VITAMIN B12: VITAMIN B 12: 960 pg/mL (ref 232–1245)

## 2018-04-27 LAB — SEDIMENTATION RATE: Sed Rate: 2 mm/hr (ref 0–40)

## 2018-04-27 LAB — HIV ANTIBODY (ROUTINE TESTING W REFLEX): HIV SCREEN 4TH GENERATION: NONREACTIVE

## 2018-04-27 NOTE — Telephone Encounter (Signed)
Called and spoke with pt about unremarkable labs per CW,MD note. Patient verbalized understanding.

## 2018-04-27 NOTE — Telephone Encounter (Signed)
-----   Message from Kathrynn Ducking, MD sent at 04/27/2018  9:21 AM EDT -----  The blood work results are unremarkable. Please call the patient.  ----- Message ----- From: Lavone Neri Lab Results In Sent: 04/27/2018   7:39 AM To: Kathrynn Ducking, MD

## 2018-05-03 DIAGNOSIS — Z79899 Other long term (current) drug therapy: Secondary | ICD-10-CM

## 2018-05-03 HISTORY — DX: Other long term (current) drug therapy: Z79.899

## 2018-05-08 ENCOUNTER — Ambulatory Visit
Admission: RE | Admit: 2018-05-08 | Discharge: 2018-05-08 | Disposition: A | Discharge status: Home / Self Care | Payer: Medicaid Other | Source: Ambulatory Visit | Attending: Neurology | Admitting: Neurology

## 2018-05-08 DIAGNOSIS — R413 Other amnesia: Secondary | ICD-10-CM | POA: Diagnosis not present

## 2018-05-09 ENCOUNTER — Telehealth: Payer: Self-pay | Admitting: Neurology

## 2018-05-09 NOTE — Telephone Encounter (Signed)
  I called the patient.  The MRI of the brain does show some mild atrophy, minimal small vessel changes are noted.  Not much change from 2017.  We will need to follow the memory issues over time.  The patient claims that she has not started her Aimovig, she is concerned about side effects.  I indicated that this medication is a very low side effect profile, I would recommend that she try the drug.   MRI brain 05/08/18:  IMPRESSION: This MRI of the brain without contrast shows the following: 1.   There is mild generalized cortical atrophy most pronounced in the frontal and anterior temporal lobes there is slightly progressed when compared to the 12/28/2015 MRI.Marland Kitchen  This is nonspecific but in the right clinical setting might be seen with frontotemporal dementia. 2.    Few scattered T2/FLAIR hyperintense foci in the hemispheres and pons consistent with minimal chronic microvascular ischemic changes, essentially unchanged when compared to the 2017 MRI. 3.    Two small lacunar infarctions in the cerebral hemispheres, also noted on the 2017 MRI. 4.    Degenerative changes involving C3-C4 C4-C5 with severe loss of disc height. 5.    There are no acute findings.

## 2018-06-03 DIAGNOSIS — E538 Deficiency of other specified B group vitamins: Secondary | ICD-10-CM

## 2018-06-03 DIAGNOSIS — E559 Vitamin D deficiency, unspecified: Secondary | ICD-10-CM

## 2018-06-03 HISTORY — DX: Vitamin D deficiency, unspecified: E55.9

## 2018-06-03 HISTORY — DX: Deficiency of other specified B group vitamins: E53.8

## 2018-09-01 ENCOUNTER — Encounter: Payer: Self-pay | Admitting: Neurology

## 2018-09-01 ENCOUNTER — Ambulatory Visit: Payer: Medicaid Other | Admitting: Neurology

## 2018-09-01 VITALS — BP 134/81 | HR 80 | Resp 18 | Ht 59.0 in | Wt 160.0 lb

## 2018-09-01 DIAGNOSIS — G479 Sleep disorder, unspecified: Secondary | ICD-10-CM | POA: Diagnosis not present

## 2018-09-01 DIAGNOSIS — G43019 Migraine without aura, intractable, without status migrainosus: Secondary | ICD-10-CM

## 2018-09-01 DIAGNOSIS — R413 Other amnesia: Secondary | ICD-10-CM

## 2018-09-01 HISTORY — DX: Migraine without aura, intractable, without status migrainosus: G43.019

## 2018-09-01 NOTE — Progress Notes (Signed)
Reason for visit: Chronic daily headache, chronic fatigue, memory disorder  Kristin Pope is an 62 y.o. female  History of present illness:  Kristin Pope is a 62 year old right-handed white female with a history of fibromyalgia and chronic fatigue over a number of years.  The patient has had some worsening problems with her memory, she is having significant fatigue, she never feels rested.  She was evaluated for sleep apnea 15 years ago and was told she had borderline sleep apnea.  The patient wakes up frequently at night, she has daily headaches at this point, she oftentimes has a headache upon awakening.  The patient sleeps a lot during the day.  She has been placed on Aimovig, she got the prescription filled but never took the medication.  She initially was concerned about potential for side effects.  She returns to this office for an evaluation.  Past Medical History:  Diagnosis Date  . Allergic rhinitis   . Anemia, pernicious    b12 def.  . Anxiety   . Arthritis   . Biceps tendon tear    right  . Cervical spondylosis with radiculopathy    C2 -- C7  . Chronic fatigue   . Chronic pain    neck, back  . Common migraine with intractable migraine 09/01/2018  . DDD (degenerative disc disease), cervical   . DDD (degenerative disc disease), lumbosacral   . Depression, major, recurrent ([place])   . Diverticulosis of colon   . Eczema   . Family history of adverse reaction to anesthesia    sister has problems waking up  . Family history of breast cancer   . Fibromyalgia 03/29/2007  . GERD (gastroesophageal reflux disease)   . Headache    constant headaches  . History of colonic diverticulitis 08/16/2010   w/ perforation (lower GI bleed)--- resolved without surgerical intervention  . 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 Hx of pyelonephritis 09/2005   due to UTI  . Hypertension   . IBS (irritable bowel  syndrome)    internates between constpation/ diarrhea  . Lumbosacral spondylosis    L2-3, L4-5  . Mild obstructive sleep apnea    per study 06/ 2009 mild osa  AHI 13/hr---  recommendation given mouth appliance, loss wt., cpap  . Mixed hyperlipidemia   . OA (osteoarthritis)    right shoulder AC joint  . Pre-diabetes   . Right rotator cuff tear     Past Surgical History:  Procedure Laterality Date  . BREAST SURGERY     breast biopsy-benign  . COLONOSCOPY  last one 08-08-2009  . DILATATION & CURRETTAGE/HYSTEROSCOPY WITH RESECTOCOPE  02-03-2011   dr Dellis Filbert  [facility]   polypectomy  . RADIAL KERATOTOMY    . RADIAL OPTIC NEUROTOMY     twice in lumbar area of back-every 6 months  . SHOULDER ARTHROSCOPY WITH ROTATOR CUFF REPAIR Right 08/13/2017   Procedure: RIGHT SHOULDER ARTHROSCOPY, DEBRIDEMENT, BICEPS TENOTOMY, ROTATOR CUFF REPAIR, DISTAL CLAVICLE RESECTION;  Surgeon: Sydnee Cabal, MD;  Location: [place];  Service: Orthopedics;  Laterality: Right;  . TOTAL HIP ARTHROPLASTY Left 07/17/2015   Procedure: LEFT TOTAL HIP ARTHROPLASTY ANTERIOR APPROACH;  Surgeon: Paralee Cancel, MD;  Location: [facility];  Service: Orthopedics;  Laterality: Left;  . TOTAL HIP ARTHROPLASTY Right 08/21/2015   Procedure: RIGHT TOTAL HIP ARTHROPLASTY ANTERIOR APPROACH;  Surgeon: Paralee Cancel, MD;  Location[facility];  Service: Orthopedics;  Laterality: Right;  . TRANSTHORACIC ECHOCARDIOGRAM  12/30/2015   ef 03-40%, grade 1 diastolic dysfunction/  mild MR/ trivial TR    Family History  Problem Relation Age of Onset  . Heart disease Mother   . Heart attack Mother   . Asthma Mother   . Breast cancer Mother 37  . Other Father        car accident  . Breast cancer Sister 59  . Lung cancer Sister   . Other Sister        ATM mutation  . Heart attack Brother 69  . Coronary artery disease Brother   . Breast cancer Maternal Grandmother   . Rheum arthritis Maternal Grandmother   . Stroke Maternal  Grandfather   . Brain cancer Paternal Grandfather   . Lung cancer Paternal Grandfather   . Breast cancer Maternal Aunt 34  . Alzheimer's disease Paternal Grandmother   . Testicular cancer Cousin 28  . Other Other        ATM mutation    Social history:  reports that she quit smoking about 33 years ago. Her smoking use included cigarettes. She quit after 11.00 years of use. She has never used smokeless tobacco. She reports that she drinks alcohol. She reports that she does not use drugs.    Allergies  Allergen Reactions  . Morphine And Related Other (See Comments)    Headaches - pt can take hydromorphone  . Codeine Other (See Comments)    REACTION: Insomnia "crazy dreams"  . Fentanyl Nausea And Vomiting    Fentanyl patch - nausea and vomiting.  . Gluten Meal Other (See Comments)    Pt avoids eating gluten  . Hydrocodone Other (See Comments)    Urinary retention---  "can only take 1/2 tablet low dose"  . Lactose Intolerance (Gi) Other (See Comments)    Upset stomach  . Lipitor [Atorvastatin] Other (See Comments)    Pain, myalgias  . Monosodium Glutamate Other (See Comments)    MSG  --- Increases blood pressure  . Tetracycline Nausea And Vomiting  . Tramadol Other (See Comments)    "Passed out"    Medications:  Prior to Admission medications   Medication Sig Start Date End Date Taking? Authorizing Provider  acetaminophen (TYLENOL) 650 MG CR tablet Take 1,300 mg every 8 (eight) hours as needed by mouth for pain.    [provider]  aspirin 325 MG tablet Take 1 tablet (325 mg total) by mouth daily. 12/30/15   Rama, Venetia Maxon, MD  celecoxib (CELEBREX) 100 MG capsule Take 100 mg by mouth daily as needed (leg pain).    [provider]  celecoxib (CELEBREX) 200 MG capsule Take 200 mg 2 (two) times daily as needed by mouth.    [provider]  cephALEXin (KEFLEX) 500 MG capsule Take 1 capsule (500 mg total) 3 (three) times daily by mouth. 08/13/17    Stilwell, Bryson L, PA-C  Chlorphen-Pseudoephed-APAP (ALLERGY SINUS-D MAX ST PO) Take 2 (two) times daily as needed by mouth (SINUS HEADACHE).    [provider]  cholecalciferol (VITAMIN D) 1000 UNITS tablet Take 1,000 Units daily by mouth.     [provider]  clonazePAM (KLONOPIN) 1 MG tablet Take 1 tablet (1 mg total) by mouth 4 (four) times daily. Patient taking differently: Take 1 mg by mouth 4 (four) times daily as needed for anxiety.  07/19/15   Danae Orleans, PA-C  cyanocobalamin (,VITAMIN B-12,) 1000 MCG/ML injection Inject 1,000 mcg See admin instructions  into the muscle. Vitamin B12 - every month --  last injection approx 07-24-2017    [provider]  Cyanocobalamin (VITAMIN B-12) 5000 MCG TBDP Take 5,000 mcg daily with lunch by mouth. Per pt varies 2012mg, 25010m, or 500057m   [provider]  doxepin (SINEQUAN) 25 MG capsule Take 25-50 mg at bedtime by mouth.    [provider]  DULoxetine (CYMBALTA) 60 MG capsule Take 60 mg by mouth at bedtime.  07/28/15   [provider]  Erenumab-aooe (AIMOVIG) 140 MG/ML SOAJ Inject 140 mg into the skin every 30 (thirty) days. 04/26/18   WilKathrynn DuckingD  ezetimibe (ZETIA) 10 MG tablet Take 1 tablet (10 mg total) by mouth daily. Patient taking differently: Take 10 mg every evening by mouth.  12/30/15   Rama, ChrVenetia MaxonD  fluticasone (FLONASE) 50 MCG/ACT nasal spray Place 1 spray as needed into both nostrils.  12/07/15   [provider]  furosemide (LASIX) 20 MG tablet Take 20 mg by mouth every Monday, Wednesday, and Friday.    [provider]  gabapentin (NEURONTIN) 300 MG capsule Take 300 mg at bedtime by mouth. PT ONLY TAKES AT BEDTIME--  STATED PRESCRIBED TID    [provider]  GuaiFENesin (MUCINEX PO) Take 1 tablet by mouth 2 (two) times daily as needed (congestion from seasonal allergies).    [provider]  hydrocortisone cream 1 % Apply 1  application topically at bedtime as needed for itching.     [provider]  lubiprostone (AMITIZA) 24 MCG capsule Take 1 capsule (24 mcg total) by mouth daily with breakfast. Patient taking differently: Take 24 mcg by mouth at bedtime.  01/20/12   JenRicard DillonD  montelukast (SINGULAIR) 10 MG tablet Take 10 mg by mouth at bedtime.     [provider]  Multiple Vitamin (MULTIVITAMIN IRON-FREE) TABS Take daily by mouth.    [provider]  nadolol (CORGARD) 40 MG tablet TAKE 1 TABLET EVERY DAY Patient taking differently: TAKE 1 TABLET EVERY DAY AT BEDTIME 12/17/12   JenRicard DillonD  nitrofurantoin, macrocrystal-monohydrate, (MACROBID) 100 MG capsule Take 100 mg at bedtime by mouth. Continuous course 12/07/15   [provider]  NUVIGIL 150 MG tablet TAKE 1 TABLET BY MOUTH EVERY DAY Patient taking differently: TAKE 1/2 TO 1 TABLET BY MOUTH 2 TIMES DAILY AS NEEDED FOR ENERGY BOOST 07/15/12   JenRicard DillonD  omeprazole (PRILOSEC) 40 MG capsule Take 40 mg at bedtime by mouth.     [provider]  pravastatin (PRAVACHOL) 40 MG tablet Take 40 mg every evening by mouth. Per pt has not taken in few months (on 08-10-2017)    [provider]  TESTOSTERONE IM Inject 1 application See admin instructions into the muscle. Every 2 1/2 months at Dr. [facility]ann [place] [facility] - last injection approx.  End of October 2018    [provider]  zolpidem (AMBIEN) 10 MG tablet Take 10 mg by mouth at bedtime.     [provider]    ROS:  Out of a complete 14 system review of symptoms, the patient complains only of the following symptoms, and all other reviewed systems are negative.  Headache Chronic fatigue Memory disturbance  Blood pressure 134/81, pulse 80, resp. rate 18, height 4' 11"  (1.499 m), weight 160 lb (72.6 kg).  Physical Exam  General: The patient is alert and cooperative at the time of the examination.  The  patient is moderately to markedly obese.  Skin: No significant peripheral edema is noted.   Neurologic Exam  Mental status: The patient is alert and oriented x 3 at the time of the examination. The patient has apparent normal recent and remote memory, with an apparently normal attention span and concentration ability.  Mini-Mental status examination done today shows a total score 29/30.  The patient is able to name 17 animals in 1 minute.   Cranial nerves: Facial symmetry is present. Speech is normal, no aphasia or dysarthria is noted. Extraocular movements are full. Visual fields are full.  Motor: The patient has good strength in all 4 extremities.  Sensory examination: Soft touch sensation is symmetric on the face, arms, and legs.  Coordination: The patient has good finger-nose-finger and heel-to-shin bilaterally.  Gait and station: The patient has a normal gait. Tandem gait is normal. Romberg is negative. No drift is seen.  Reflexes: Deep tendon reflexes are symmetric.    MRI brain 05/08/18:  IMPRESSION: This MRI of the brain without contrast shows the following: 1. There is mild generalized cortical atrophy most pronounced in the frontal and anterior temporal lobes there is slightly progressed when compared to the 12/28/2015 MRI.Marland Kitchen This is nonspecific but in the right clinical setting might be seen with frontotemporal dementia. 2. Few scattered T2/FLAIR hyperintense foci in the hemispheres and pons consistent with minimal chronic microvascular ischemic changes, essentially unchanged when compared to the 2017 MRI. 3. Two small lacunar infarctions in the cerebral hemispheres, also noted on the 2017 MRI. 4. Degenerative changes involving C3-C4 C4-C5 with severe loss of disc height. 5. There are no acute findings.  * MRI scan images were reviewed online. I agree with the written report.    Assessment/Plan:  1.  Chronic daily headache, common migraine  2.  Chronic  fatigue, fibromyalgia  3.  Mild memory disturbance  The patient does have excessive fatigue and daytime drowsiness, she will be sent for a sleep evaluation.  The patient is to go on Aimovig, I have reassured her that the medication has very few side effects.  She is on doxepin which may worsen memory problems.  The patient will follow-up in 6 months.  If the Aimovig is not effective, Botox may be considered.  Jill Alexanders MD 09/01/2018 2:33 PM  [facility] [address] [place], [place] [address]  Phone [phone] Fax [phone]

## 2018-10-26 ENCOUNTER — Institutional Professional Consult (permissible substitution): Payer: Medicaid Other | Admitting: Neurology

## 2018-12-02 ENCOUNTER — Institutional Professional Consult (permissible substitution): Payer: Medicaid Other | Admitting: Neurology

## 2019-04-12 ENCOUNTER — Ambulatory Visit: Payer: Medicaid Other | Admitting: Neurology

## 2019-08-11 ENCOUNTER — Other Ambulatory Visit: Payer: Self-pay

## 2019-08-11 ENCOUNTER — Encounter: Payer: Self-pay | Admitting: Neurology

## 2019-08-11 ENCOUNTER — Ambulatory Visit: Payer: Medicaid Other | Admitting: Neurology

## 2019-08-11 VITALS — BP 128/86 | HR 98 | Ht 60.0 in | Wt 157.1 lb

## 2019-08-11 DIAGNOSIS — G43019 Migraine without aura, intractable, without status migrainosus: Secondary | ICD-10-CM

## 2019-08-11 DIAGNOSIS — R413 Other amnesia: Secondary | ICD-10-CM | POA: Insufficient documentation

## 2019-08-11 HISTORY — DX: Other amnesia: R41.3

## 2019-08-11 MED ORDER — DONEPEZIL HCL 5 MG PO TABS: 5.0000 mg | ORAL_TABLET | Freq: Every day | ORAL | 0 refills | Status: DC

## 2019-08-11 NOTE — Patient Instructions (Signed)
We will start Aricept for the memory.   Begin Aricept (donepezil) at 5 mg at night for one month. If this medication is well-tolerated, please call our office and we will call in a prescription for the 10 mg tablets. Look out for side effects that may include nausea, diarrhea, weight loss, or stomach cramps. This medication will also cause a runny nose, therefore there is no need for allergy medications for this purpose.  

## 2019-08-11 NOTE — Progress Notes (Signed)
Reason for visit: Memory disturbance, fibromyalgia, headache  Kristin Pope is an 62 y.o. female  History of present illness:  Kristin Pope is a 63 year old right-handed white female with a history of fibromyalgia and chronic fatigue.  She had the Covid virus infection in May 2020, she has not fully recovered with increased fatigue since that time.  The patient has ongoing issues with mild memory problems, she has word finding problems and sometimes has difficulty completing a sentence.  She has difficulty keeping up with the date and time.  She still functions independently, she is able to operate a motor vehicle.  She is still has some headaches but the headaches are not as severe as the were, they are occurring several times a week and are fairly mild.  The patient never took Aimovig in fear of the potential side effects of the drug.  The patient returns to the office today for an evaluation.   Past Medical History:  Diagnosis Date  . Allergic rhinitis   . Anemia, pernicious    b12 def.  . Anxiety   . Arthritis   . Biceps tendon tear    right  . Cervical spondylosis with radiculopathy    C2 -- C7  . Chronic fatigue   . Chronic pain    neck, back  . Common migraine with intractable migraine 09/01/2018  . DDD (degenerative disc disease), cervical   . DDD (degenerative disc disease), lumbosacral   . Depression, major, recurrent ([place])   . Diverticulosis of colon   . Eczema   . Family history of adverse reaction to anesthesia    sister has problems waking up  . Family history of breast cancer   . Fibromyalgia 03/29/2007  . GERD (gastroesophageal reflux disease)   . Headache    constant headaches  . History of colonic diverticulitis 08/16/2010   w/ perforation (lower GI bleed)--- resolved without surgerical intervention  . 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 Hx of pyelonephritis 09/2005   due to  UTI  . Hypertension   . IBS (irritable bowel syndrome)    internates between constpation/ diarrhea  . Lumbosacral spondylosis    L2-3, L4-5  . Mild obstructive sleep apnea    per study 06/ 2009 mild osa  AHI 13/hr---  recommendation given mouth appliance, loss wt., cpap  . Mixed hyperlipidemia   . OA (osteoarthritis)    right shoulder AC joint  . Pre-diabetes   . Right rotator cuff tear     Past Surgical History:  Procedure Laterality Date  . BREAST SURGERY     breast biopsy-benign  . COLONOSCOPY  last one 08-08-2009  . DILATATION & CURRETTAGE/HYSTEROSCOPY WITH RESECTOCOPE  02-03-2011   dr Dellis Filbert  [facility]   polypectomy  . RADIAL KERATOTOMY    . RADIAL OPTIC NEUROTOMY     twice in lumbar area of back-every 6 months  . SHOULDER ARTHROSCOPY WITH ROTATOR CUFF REPAIR Right 08/13/2017   Procedure: RIGHT SHOULDER ARTHROSCOPY, DEBRIDEMENT, BICEPS TENOTOMY, ROTATOR CUFF REPAIR, DISTAL CLAVICLE RESECTION;  Surgeon: Sydnee Cabal, MD;  Location: [place];  Service: Orthopedics;  Laterality: Right;  . TOTAL HIP ARTHROPLASTY Left 07/17/2015   Procedure: LEFT TOTAL HIP ARTHROPLASTY ANTERIOR APPROACH;  Surgeon: Paralee Cancel, MD;  [facility];  Service: Orthopedics;  Laterality: Left;  . TOTAL HIP ARTHROPLASTY Right 08/21/2015   Procedure: RIGHT TOTAL HIP ARTHROPLASTY ANTERIOR APPROACH;  Surgeon:  Paralee Cancel, MD;  [facility];  Service: Orthopedics;  Laterality: Right;  . TRANSTHORACIC ECHOCARDIOGRAM  12/30/2015   ef 38-17%, grade 1 diastolic dysfunction/  mild MR/ trivial TR    Family History  Problem Relation Age of Onset  . Heart disease Mother   . Heart attack Mother   . Asthma Mother   . Breast cancer Mother 64  . Other Father        car accident  . Breast cancer Sister 69  . Lung cancer Sister   . Other Sister        ATM mutation  . Heart attack Brother 25  . Coronary artery disease Brother   . Breast cancer Maternal Grandmother   . Rheum arthritis  Maternal Grandmother   . Stroke Maternal Grandfather   . Brain cancer Paternal Grandfather   . Lung cancer Paternal Grandfather   . Breast cancer Maternal Aunt 34  . Alzheimer's disease Paternal Grandmother   . Testicular cancer Cousin 28  . Other Other        ATM mutation    Social history:  reports that she quit smoking about 34 years ago. Her smoking use included cigarettes. She quit after 11.00 years of use. She has never used smokeless tobacco. She reports current alcohol use. She reports that she does not use drugs.    Allergies  Allergen Reactions  . Morphine And Related Other (See Comments)    Headaches - pt can take hydromorphone  . Codeine Other (See Comments)    REACTION: Insomnia "crazy dreams"  . Fentanyl Nausea And Vomiting    Fentanyl patch - nausea and vomiting.  . Gluten Meal Other (See Comments)    Pt avoids eating gluten  . Hydrocodone Other (See Comments)    Urinary retention---  "can only take 1/2 tablet low dose"  . Lactose Intolerance (Gi) Other (See Comments)    Upset stomach  . Lipitor [Atorvastatin] Other (See Comments)    Pain, myalgias  . Monosodium Glutamate Other (See Comments)    MSG  --- Increases blood pressure  . Tetracycline Nausea And Vomiting  . Tramadol Other (See Comments)    "Passed out"    Medications:  Prior to Admission medications   Medication Sig Start Date End Date Taking? Authorizing Provider  telmisartan (MICARDIS) 20 MG tablet Take by mouth. 03/22/19 03/21/20 Yes [provider]  acetaminophen (TYLENOL) 650 MG CR tablet Take 1,300 mg every 8 (eight) hours as needed by mouth for pain.    [provider]  aspirin 325 MG tablet Take 1 tablet (325 mg total) by mouth daily. 12/30/15   Rama, Venetia Maxon, MD  celecoxib (CELEBREX) 200 MG capsule Take 200 mg 2 (two) times daily as needed by mouth.    [provider]  Chlorphen-Pseudoephed-APAP (ALLERGY SINUS-D MAX ST PO) Take 2 (two) times daily as needed by  mouth (SINUS HEADACHE).    [provider]  cholecalciferol (VITAMIN D) 1000 UNITS tablet Take 1,000 Units daily by mouth.     [provider]  clonazePAM (KLONOPIN) 1 MG tablet Take 1 tablet (1 mg total) by mouth 4 (four) times daily. Patient taking differently: Take 1 mg by mouth 4 (four) times daily as needed for anxiety.  07/19/15   Danae Orleans, PA-C  cyanocobalamin (,VITAMIN B-12,) 1000 MCG/ML injection Inject 1,000 mcg See admin instructions into the muscle. Vitamin B12 - every month --  last injection approx 07-24-2017    [provider]  Cyanocobalamin (  VITAMIN B-12) 5000 MCG TBDP Take 5,000 mcg daily with lunch by mouth. Per pt varies 2042mg, 25062m, or 500024m   [provider]  DULoxetine (CYMBALTA) 60 MG capsule Take 60 mg by mouth at bedtime.  07/28/15   [provider]  ezetimibe (ZETIA) 10 MG tablet Take 1 tablet (10 mg total) by mouth daily. Patient taking differently: Take 10 mg every evening by mouth.  12/30/15   Rama, ChrVenetia MaxonD  fluticasone (FLONASE) 50 MCG/ACT nasal spray Place 1 spray as needed into both nostrils.  12/07/15   [provider]  furosemide (LASIX) 20 MG tablet Take 20 mg by mouth every Monday, Wednesday, and Friday.    [provider]  gabapentin (NEURONTIN) 300 MG capsule Take 300 mg at bedtime by mouth. PT ONLY TAKES AT BEDTIME--  STATED PRESCRIBED TID    [provider]  hydrocortisone cream 1 % Apply 1 application topically at bedtime as needed for itching.     [provider]  lubiprostone (AMITIZA) 24 MCG capsule Take 1 capsule (24 mcg total) by mouth daily with breakfast. Patient taking differently: Take 24 mcg by mouth at bedtime.  01/20/12   JenRicard DillonD  montelukast (SINGULAIR) 10 MG tablet Take 10 mg by mouth at bedtime.     [provider]  Multiple Vitamin (MULTIVITAMIN IRON-FREE) TABS Take daily by mouth.    [provider]   nitrofurantoin, macrocrystal-monohydrate, (MACROBID) 100 MG capsule Take 100 mg at bedtime by mouth. Continuous course 12/07/15   [provider]  NUVIGIL 150 MG tablet TAKE 1 TABLET BY MOUTH EVERY DAY Patient taking differently: TAKE 1/2 TO 1 TABLET BY MOUTH 2 TIMES DAILY AS NEEDED FOR ENERGY BOOST 07/15/12   JenRicard DillonD  omeprazole (PRILOSEC) 40 MG capsule Take 40 mg at bedtime by mouth.     [provider]  pravastatin (PRAVACHOL) 40 MG tablet Take 40 mg every evening by mouth. Per pt has not taken in few months (on 08-10-2017)    [provider]  TESTOSTERONE IM Inject 1 application See admin instructions into the muscle. Every 2 1/2 months at Dr. WeiStann [facility]land [facility] - last injection approx.  End of October 2018    [provider]  valsartan (DIOVAN) 80 MG tablet Take 80 mg by mouth daily. 07/16/18   [provider]  zolpidem (AMBIEN) 10 MG tablet Take 10 mg by mouth at bedtime.     [provider]    ROS:  Out of a complete 14 system review of symptoms, the patient complains only of the following symptoms, and all other reviewed systems are negative.  Fatigue Insomnia Headache  Height 5' (1.524 m), weight 157 lb 2 oz (71.3 kg).  Physical Exam  General: The patient is alert and cooperative at the time of the examination.  The patient is moderately obese.  Skin: No significant peripheral edema is noted.   Neurologic Exam  Mental status: The patient is alert and oriented x 3 at the time of the examination. The scored 25/30 on the [place] testing procedure.   Cranial nerves: Facial symmetry is present. Speech is normal, no aphasia or dysarthria is noted. Extraocular movements are full. Visual fields are full.  Motor: The patient has good strength in all 4 extremities.  Sensory examination: Soft touch sensation is symmetric on the face, arms, and legs.  Coordination: The patient has good finger-nose-finger  and heel-to-shin bilaterally.  Gait and station:  The patient has a normal gait. Tandem gait is slightly unsteady.  Romberg is negative. No drift is seen.  Reflexes: Deep tendon reflexes are symmetric.   Assessment/Plan:  1.  Mild memory disturbance  2.  Fibromyalgia, chronic fatigue  3.  History of headache  The patient does not appear to want to start the Aimovig, her headaches have improved somewhat.  She remains on amitriptyline at night which may impair memory but she uses this mainly for chronic insomnia.  The patient will go on low-dose Aricept taking 5 mg at night, she will call after 1 month if she is tolerating the medication to go to the maintenance dose of 10 mg at night.  She will follow-up here in 6 months, we will follow the memory issues over time.   Jill Alexanders MD 08/11/2019 4:22 PM  [facility] [address] [place], [place] [address]  Phone [phone] Fax [phone]

## 2019-09-06 ENCOUNTER — Telehealth: Payer: Self-pay | Admitting: Neurology

## 2019-09-06 MED ORDER — DONEPEZIL HCL 10 MG PO TABS: 10.0000 mg | ORAL_TABLET | Freq: Every day | ORAL | 3 refills | Status: DC

## 2019-09-06 NOTE — Telephone Encounter (Signed)
I will send in the prescription for the 10 mg tablet.

## 2019-09-06 NOTE — Addendum Note (Signed)
Addended by: Verlin Grills T on: 09/06/2019 11:03 AM   Modules accepted: Orders

## 2019-09-06 NOTE — Addendum Note (Signed)
Addended by: Kathrynn Ducking on: 09/06/2019 02:13 PM   Modules accepted: Orders

## 2019-09-06 NOTE — Telephone Encounter (Signed)
Pt has called to inform that she did well on the donepezil (ARICEPT) 5 MG tablet.  Pt is ready now for the increased strength of the medication that Dr Jannifer Franklin told her about, please call.

## 2020-02-09 ENCOUNTER — Ambulatory Visit: Payer: Medicaid Other | Admitting: Neurology

## 2020-02-09 NOTE — Progress Notes (Deleted)
PATIENT: Kristin Pope DOB: [birth year]  REASON FOR VISIT: follow up HISTORY FROM: patient  HISTORY OF PRESENT ILLNESS: Today 02/09/20  Kristin Pope is a 64 year old female with history of fibromyalgia and chronic fatigue.  She has ongoing issues with mild memory trouble.  Had difficulty with word finding.  She also complained of headache.  She is on amitriptyline and Aricept.  HISTORY 08/11/2019 Dr. Jannifer Franklin: Kristin Pope is a 64 year old right-handed white female with a history of fibromyalgia and chronic fatigue.  She had the Covid virus infection in May 2020, she has not fully recovered with increased fatigue since that time.  The patient has ongoing issues with mild memory problems, she has word finding problems and sometimes has difficulty completing a sentence.  She has difficulty keeping up with the date and time.  She still functions independently, she is able to operate a motor vehicle.  She is still has some headaches but the headaches are not as severe as the were, they are occurring several times a week and are fairly mild.  The patient never took Aimovig in fear of the potential side effects of the drug.  The patient returns to the office today for an evaluation.    REVIEW OF SYSTEMS: Out of a complete 14 system review of symptoms, the patient complains only of the following symptoms, and all other reviewed systems are negative.  ALLERGIES: Allergies  Allergen Reactions  . Morphine And Related Other (See Comments)    Headaches - pt can take hydromorphone  . Codeine Other (See Comments)    REACTION: Insomnia "crazy dreams"  . Fentanyl Nausea And Vomiting    Fentanyl patch - nausea and vomiting.  . Gluten Meal Other (See Comments)    Pt avoids eating gluten  . Hydrocodone Other (See Comments)    Urinary retention---  "can only take 1/2 tablet low dose"  . Lactose Intolerance (Gi) Other (See Comments)    Upset stomach  . Lipitor [Atorvastatin] Other (See Comments)    Pain,  myalgias  . Monosodium Glutamate Other (See Comments)    MSG  --- Increases blood pressure  . Tetracycline Nausea And Vomiting  . Tramadol Other (See Comments)    "Passed out"    HOME MEDICATIONS: Outpatient Medications Prior to Visit  Medication Sig Dispense Refill  . acetaminophen (TYLENOL) 650 MG CR tablet Take 1,300 mg every 8 (eight) hours as needed by mouth for pain.    Marland Kitchen amitriptyline (ELAVIL) 50 MG tablet Take 50 mg by mouth at bedtime.    . ARIPiprazole (ABILIFY) 5 MG tablet Take 5 mg by mouth daily.    Marland Kitchen aspirin 325 MG tablet Take 1 tablet (325 mg total) by mouth daily. 30 tablet 10  . buPROPion (ZYBAN) 150 MG 12 hr tablet Take 150 mg by mouth. 50+150    . cefdinir (OMNICEF) 300 MG capsule Take 300 mg by mouth 2 (two) times daily.    . celecoxib (CELEBREX) 200 MG capsule Take 200 mg 2 (two) times daily as needed by mouth.    . Chlorphen-Pseudoephed-APAP (ALLERGY SINUS-D MAX ST PO) Take 2 (two) times daily as needed by mouth (SINUS HEADACHE).    . cholecalciferol (VITAMIN D) 1000 UNITS tablet Take 1,000 Units daily by mouth.     . clonazePAM (KLONOPIN) 1 MG tablet Take 1 tablet (1 mg total) by mouth 4 (four) times daily. (Patient taking differently: Take 1 mg by mouth 4 (four) times daily as needed for anxiety. ) 60 tablet  0  . cyanocobalamin (,VITAMIN B-12,) 1000 MCG/ML injection Inject 1,000 mcg See admin instructions into the muscle. Vitamin B12 - every month --  last injection approx 07-24-2017    . Cyanocobalamin (VITAMIN B-12) 5000 MCG TBDP Take 5,000 mcg daily with lunch by mouth. Per pt varies 2056mg, 25028m, or 500096m   . donepezil (ARICEPT) 10 MG tablet Take 1 tablet (10 mg total) by mouth at bedtime. 90 tablet 3  . DULoxetine (CYMBALTA) 60 MG capsule Take 60 mg by mouth at bedtime.   5  . ezetimibe (ZETIA) 10 MG tablet Take 1 tablet (10 mg total) by mouth daily. (Patient taking differently: Take 10 mg every evening by mouth. ) 30 tablet 10  . fluticasone (FLONASE) 50  MCG/ACT nasal spray Place 1 spray as needed into both nostrils.   5  . furosemide (LASIX) 20 MG tablet Take 20 mg by mouth every Monday, Wednesday, and Friday.    . gabapentin (NEURONTIN) 300 MG capsule Take 300 mg at bedtime by mouth. PT ONLY TAKES AT BEDTIME--  STATED PRESCRIBED TID    . hydrocortisone cream 1 % Apply 1 application topically at bedtime as needed for itching.     . lubiprostone (AMITIZA) 24 MCG capsule Take 1 capsule (24 mcg total) by mouth daily with breakfast. (Patient taking differently: Take 24 mcg by mouth at bedtime. ) 30 capsule 11  . methocarbamol (ROBAXIN) 500 MG tablet Take 500 mg by mouth 4 (four) times daily.    . montelukast (SINGULAIR) 10 MG tablet Take 10 mg by mouth at bedtime.     . Multiple Vitamin (MULTIVITAMIN IRON-FREE) TABS Take daily by mouth.    . nitrofurantoin, macrocrystal-monohydrate, (MACROBID) 100 MG capsule Take 100 mg at bedtime by mouth. Continuous course  5  . NUVIGIL 150 MG tablet TAKE 1 TABLET BY MOUTH EVERY DAY (Patient taking differently: TAKE 1/2 TO 1 TABLET BY MOUTH 2 TIMES DAILY AS NEEDED FOR ENERGY BOOST) 30 tablet 1  . omeprazole (PRILOSEC) 40 MG capsule Take 40 mg at bedtime by mouth.     . pravastatin (PRAVACHOL) 40 MG tablet Take 40 mg every evening by mouth. Per pt has not taken in few months (on 08-10-2017)    . telmisartan (MICARDIS) 20 MG tablet Take by mouth.    . TESTOSTERONE IM Inject 1 application See admin instructions into the muscle. Every 2 1/2 months at Dr. [facility]ann [facility] - last injection approx.  End of October 2018    . tizanidine (ZANAFLEX) 2 MG capsule Take 2 mg by mouth 3 (three) times daily.    . valsartan (DIOVAN) 80 MG tablet Take 80 mg by mouth daily.  3  . zolpidem (AMBIEN) 10 MG tablet Take 10 mg by mouth at bedtime.      No facility-administered medications prior to visit.    PAST MEDICAL HISTORY: Past Medical History:  Diagnosis Date  . Allergic rhinitis   . Anemia, pernicious    b12  def.  . Anxiety   . Arthritis   . Biceps tendon tear    right  . Cervical spondylosis with radiculopathy    C2 -- C7  . Chronic fatigue   . Chronic pain    neck, back  . Common migraine with intractable migraine 09/01/2018  . DDD (degenerative disc disease), cervical   . DDD (degenerative disc disease), lumbosacral   . Depression, major, recurrent ([place] . Diverticulosis of colon   . Eczema   .  Family history of adverse reaction to anesthesia    sister has problems waking up  . Family history of breast cancer   . Fibromyalgia 03/29/2007  . GERD (gastroesophageal reflux disease)   . Headache    constant headaches  . History of colonic diverticulitis 08/16/2010   w/ perforation (lower GI bleed)--- resolved without surgerical intervention  . 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 Hx of pyelonephritis 09/2005   due to UTI  . Hypertension   . IBS (irritable bowel syndrome)    internates between constpation/ diarrhea  . Lumbosacral spondylosis    L2-3, L4-5  . Memory difficulty 08/11/2019  . Mild obstructive sleep apnea    per study 06/ 2009 mild osa  AHI 13/hr---  recommendation given mouth appliance, loss wt., cpap  . Mixed hyperlipidemia   . OA (osteoarthritis)    right shoulder AC joint  . Pre-diabetes   . Right rotator cuff tear     PAST SURGICAL HISTORY: Past Surgical History:  Procedure Laterality Date  . BREAST SURGERY     breast biopsy-benign  . COLONOSCOPY  last one 08-08-2009  . DILATATION & CURRETTAGE/HYSTEROSCOPY WITH RESECTOCOPE  02-03-2011   dr Dellis Filbert  [facility]   polypectomy  . RADIAL KERATOTOMY    . RADIAL OPTIC NEUROTOMY     twice in lumbar area of back-every 6 months  . SHOULDER ARTHROSCOPY WITH ROTATOR CUFF REPAIR Right 08/13/2017   Procedure: RIGHT SHOULDER ARTHROSCOPY, DEBRIDEMENT, BICEPS TENOTOMY, ROTATOR CUFF REPAIR, DISTAL CLAVICLE RESECTION;  Surgeon: Sydnee Cabal, MD;   Location: [facility];  Service: Orthopedics;  Laterality: Right;  . TOTAL HIP ARTHROPLASTY Left 07/17/2015   Procedure: LEFT TOTAL HIP ARTHROPLASTY ANTERIOR APPROACH;  Surgeon: Paralee Cancel, MD;  Location: [facility];  Service: Orthopedics;  Laterality: Left;  . TOTAL HIP ARTHROPLASTY Right 08/21/2015   Procedure: RIGHT TOTAL HIP ARTHROPLASTY ANTERIOR APPROACH;  Surgeon: Paralee Cancel, MD;  Location: [facility];  Service: Orthopedics;  Laterality: Right;  . TRANSTHORACIC ECHOCARDIOGRAM  12/30/2015   ef 91-66%, grade 1 diastolic dysfunction/  mild MR/ trivial TR    FAMILY HISTORY: Family History  Problem Relation Age of Onset  . Heart disease Mother   . Heart attack Mother   . Asthma Mother   . Breast cancer Mother 47  . Other Father        car accident  . Breast cancer Sister 40  . Lung cancer Sister   . Other Sister        ATM mutation  . Heart attack Brother 19  . Coronary artery disease Brother   . Breast cancer Maternal Grandmother   . Rheum arthritis Maternal Grandmother   . Stroke Maternal Grandfather   . Brain cancer Paternal Grandfather   . Lung cancer Paternal Grandfather   . Breast cancer Maternal Aunt 34  . Alzheimer's disease Paternal Grandmother   . Testicular cancer Cousin 28  . Other Other        ATM mutation    SOCIAL HISTORY: Social History   Socioeconomic History  . Marital status: Divorced    Spouse name: Not on file  . Number of children: 0  . Years of education: 32  . Highest education level: Not on file  Occupational History  . Occupation: retired    Fish farm manager: UNEMPLOYED  Tobacco Use  . Smoking status: Former Smoker    Years: 11.00    Types: Cigarettes  Quit date: 09/29/1984    Years since quitting: 35.3  . Smokeless tobacco: Never Used  Substance and Sexual Activity  . Alcohol use: Yes    Comment: wine occassionally  . Drug use: No    Comment: hx occasional marjuana (as documented 2017)  . Sexual activity: Not on file  Other  Topics Concern  . Not on file  Social History Narrative   Lives alone   Caffeine use: Drinks coffee sometimes   Right handed    Social Determinants of Health   Financial Resource Strain:   . Difficulty of Paying Living Expenses:   Food Insecurity:   . Worried About Charity fundraiser in the Last Year:   . Arboriculturist in the Last Year:   Transportation Needs:   . Film/video editor (Medical):   Marland Kitchen Lack of Transportation (Non-Medical):   Physical Activity:   . Days of Exercise per Week:   . Minutes of Exercise per Session:   Stress:   . Feeling of Stress :   Social Connections:   . Frequency of Communication with Friends and Family:   . Frequency of Social Gatherings with Friends and Family:   . Attends Religious Services:   . Active Member of Clubs or Organizations:   . Attends Archivist Meetings:   Marland Kitchen Marital Status:   Intimate Partner Violence:   . Fear of Current or Ex-Partner:   . Emotionally Abused:   Marland Kitchen Physically Abused:   . Sexually Abused:       PHYSICAL EXAM  There were no vitals filed for this visit. There is no height or weight on file to calculate BMI.  Generalized: Well developed, in no acute distress   Neurological examination  Mentation: Alert oriented to time, place, history taking. Follows all commands speech and language fluent Cranial nerve II-XII: Pupils were equal round reactive to light. Extraocular movements were full, visual field were full on confrontational test. Facial sensation and strength were normal. Uvula tongue midline. Head turning and shoulder shrug  were normal and symmetric. Motor: The motor testing reveals 5 over 5 strength of all 4 extremities. Good symmetric motor tone is noted throughout.  Sensory: Sensory testing is intact to soft touch on all 4 extremities. No evidence of extinction is noted.  Coordination: Cerebellar testing reveals good finger-nose-finger and heel-to-shin bilaterally.  Gait and station:  Gait is normal. Tandem gait is normal. Romberg is negative. No drift is seen.  Reflexes: Deep tendon reflexes are symmetric and normal bilaterally.   DIAGNOSTIC DATA (LABS, IMAGING, TESTING) - I reviewed patient records, labs, notes, testing and imaging myself where available.  Lab Results  Component Value Date   WBC 7.8 12/28/2015   HGB 12.6 08/13/2017   HCT 37.0 08/13/2017   MCV 95.3 12/28/2015   PLT 363 12/28/2015      Component Value Date/Time   NA 140 08/13/2017 1229   K 4.5 08/13/2017 1229   CL 102 08/13/2017 1229   CO2 28 12/28/2015 1623   GLUCOSE 98 08/13/2017 1229   GLUCOSE 76 08/28/2006 1454   BUN 24 (H) 08/13/2017 1229   CREATININE 0.80 08/13/2017 1229   CALCIUM 10.1 12/28/2015 1623   CALCIUM 10.4 11/23/2008 0000   PROT 7.4 12/28/2015 1623   ALBUMIN 4.7 12/28/2015 1623   AST 20 12/28/2015 1623   ALT 14 12/28/2015 1623   ALKPHOS 86 12/28/2015 1623   BILITOT 0.6 12/28/2015 1623   GFRNONAA >60 12/28/2015 1623   GFRAA >  60 12/28/2015 1623   Lab Results  Component Value Date   CHOL 286 (H) 12/29/2015   HDL 57 12/29/2015   LDLCALC 194 (H) 12/29/2015   LDLDIRECT 111.0 11/12/2011   TRIG 177 (H) 12/29/2015   CHOLHDL 5.0 12/29/2015   Lab Results  Component Value Date   HGBA1C 5.7 (H) 12/29/2015   Lab Results  Component Value Date   VITAMINB12 960 04/26/2018   Lab Results  Component Value Date   TSH 1.12 01/08/2010      ASSESSMENT AND PLAN 64 y.o. year old female  has a past medical history of Allergic rhinitis, Anemia, pernicious, Anxiety, Arthritis, Biceps tendon tear, Cervical spondylosis with radiculopathy, Chronic fatigue, Chronic pain, Common migraine with intractable migraine (09/01/2018), DDD (degenerative disc disease), cervical, DDD (degenerative disc disease), lumbosacral, Depression, major, recurrent ([place]), Diverticulosis of colon, Eczema, Family history of adverse reaction to anesthesia, Family history of breast cancer, Fibromyalgia (03/29/2007),  GERD (gastroesophageal reflux disease), Headache, History of colonic diverticulitis (08/16/2010), History of TIA (transient ischemic attack) (12/28/2015), pyelonephritis (09/2005), Hypertension, IBS (irritable bowel syndrome), Lumbosacral spondylosis, Memory difficulty (08/11/2019), Mild obstructive sleep apnea, Mixed hyperlipidemia, OA (osteoarthritis), Pre-diabetes, and Right rotator cuff tear. here with ***   I spent 15 minutes with the patient. 50% of this time was spent   Butler Denmark, [place], DNP 02/09/2020, 5:48 AM [facility] [address] [place], [place] [address] [phone]

## 2020-04-09 ENCOUNTER — Telehealth: Payer: Self-pay

## 2020-04-09 NOTE — Telephone Encounter (Signed)
Called patient and advised her Ill cancel FU tomorrow. We scheduled for soonest, and I put her on wait list.Patient verbalized understanding, appreciation. '

## 2020-04-09 NOTE — Telephone Encounter (Signed)
Pt left a VM asking for a call to r/s her appt tomorrow since she is being tested for COVID. Please call.

## 2020-04-09 NOTE — Progress Notes (Deleted)
PATIENT: Kristin Pope DOB: 30-Apr-1956  REASON FOR VISIT: follow up HISTORY FROM: patient  HISTORY OF PRESENT ILLNESS: Today 04/09/20  Kristin Pope is a 64 year old female with history of fibromyalgia and chronic fatigue.  She has history of headaches, never started Aimovig due to fear of side effect.  At last visit she was placed on Aricept due to report of memory disturbance.  HISTORY 08/11/2019 Dr. Jannifer Franklin: Kristin Pope is a 64 year old right-handed white female with a history of fibromyalgia and chronic fatigue.  She had the Covid virus infection in May 2020, she has not fully recovered with increased fatigue since that time.  The patient has ongoing issues with mild memory problems, she has word finding problems and sometimes has difficulty completing a sentence.  She has difficulty keeping up with the date and time.  She still functions independently, she is able to operate a motor vehicle.  She is still has some headaches but the headaches are not as severe as the were, they are occurring several times a week and are fairly mild.  The patient never took Aimovig in fear of the potential side effects of the drug.  The patient returns to the office today for an evaluation.    REVIEW OF SYSTEMS: Out of a complete 14 system review of symptoms, the patient complains only of the following symptoms, and all other reviewed systems are negative.  ALLERGIES: Allergies  Allergen Reactions  . Morphine And Related Other (See Comments)    Headaches - pt can take hydromorphone  . Codeine Other (See Comments)    REACTION: Insomnia "crazy dreams"  . Fentanyl Nausea And Vomiting    Fentanyl patch - nausea and vomiting.  . Gluten Meal Other (See Comments)    Pt avoids eating gluten  . Hydrocodone Other (See Comments)    Urinary retention---  "can only take 1/2 tablet low dose"  . Lactose Intolerance (Gi) Other (See Comments)    Upset stomach  . Lipitor [Atorvastatin] Other (See Comments)     Pain, myalgias  . Monosodium Glutamate Other (See Comments)    MSG  --- Increases blood pressure  . Tetracycline Nausea And Vomiting  . Tramadol Other (See Comments)    "Passed out"    HOME MEDICATIONS: Outpatient Medications Prior to Visit  Medication Sig Dispense Refill  . acetaminophen (TYLENOL) 650 MG CR tablet Take 1,300 mg every 8 (eight) hours as needed by mouth for pain.    Marland Kitchen amitriptyline (ELAVIL) 50 MG tablet Take 50 mg by mouth at bedtime.    . ARIPiprazole (ABILIFY) 5 MG tablet Take 5 mg by mouth daily.    Marland Kitchen aspirin 325 MG tablet Take 1 tablet (325 mg total) by mouth daily. 30 tablet 10  . buPROPion (ZYBAN) 150 MG 12 hr tablet Take 150 mg by mouth. 50+150    . cefdinir (OMNICEF) 300 MG capsule Take 300 mg by mouth 2 (two) times daily.    . celecoxib (CELEBREX) 200 MG capsule Take 200 mg 2 (two) times daily as needed by mouth.    . Chlorphen-Pseudoephed-APAP (ALLERGY SINUS-D MAX ST PO) Take 2 (two) times daily as needed by mouth (SINUS HEADACHE).    . cholecalciferol (VITAMIN D) 1000 UNITS tablet Take 1,000 Units daily by mouth.     . clonazePAM (KLONOPIN) 1 MG tablet Take 1 tablet (1 mg total) by mouth 4 (four) times daily. (Patient taking differently: Take 1 mg by mouth 4 (four) times daily as needed for anxiety. )  60 tablet 0  . cyanocobalamin (,VITAMIN B-12,) 1000 MCG/ML injection Inject 1,000 mcg See admin instructions into the muscle. Vitamin B12 - every month --  last injection approx 07-24-2017    . Cyanocobalamin (VITAMIN B-12) 5000 MCG TBDP Take 5,000 mcg daily with lunch by mouth. Per pt varies 2045mg, 25040m, or 500092m   . donepezil (ARICEPT) 10 MG tablet Take 1 tablet (10 mg total) by mouth at bedtime. 90 tablet 3  . DULoxetine (CYMBALTA) 60 MG capsule Take 60 mg by mouth at bedtime.   5  . ezetimibe (ZETIA) 10 MG tablet Take 1 tablet (10 mg total) by mouth daily. (Patient taking differently: Take 10 mg every evening by mouth. ) 30 tablet 10  . fluticasone  (FLONASE) 50 MCG/ACT nasal spray Place 1 spray as needed into both nostrils.   5  . furosemide (LASIX) 20 MG tablet Take 20 mg by mouth every Monday, Wednesday, and Friday.    . gabapentin (NEURONTIN) 300 MG capsule Take 300 mg at bedtime by mouth. PT ONLY TAKES AT BEDTIME--  STATED PRESCRIBED TID    . hydrocortisone cream 1 % Apply 1 application topically at bedtime as needed for itching.     . lubiprostone (AMITIZA) 24 MCG capsule Take 1 capsule (24 mcg total) by mouth daily with breakfast. (Patient taking differently: Take 24 mcg by mouth at bedtime. ) 30 capsule 11  . methocarbamol (ROBAXIN) 500 MG tablet Take 500 mg by mouth 4 (four) times daily.    . montelukast (SINGULAIR) 10 MG tablet Take 10 mg by mouth at bedtime.     . Multiple Vitamin (MULTIVITAMIN IRON-FREE) TABS Take daily by mouth.    . nitrofurantoin, macrocrystal-monohydrate, (MACROBID) 100 MG capsule Take 100 mg at bedtime by mouth. Continuous course  5  . NUVIGIL 150 MG tablet TAKE 1 TABLET BY MOUTH EVERY DAY (Patient taking differently: TAKE 1/2 TO 1 TABLET BY MOUTH 2 TIMES DAILY AS NEEDED FOR ENERGY BOOST) 30 tablet 1  . omeprazole (PRILOSEC) 40 MG capsule Take 40 mg at bedtime by mouth.     . pravastatin (PRAVACHOL) 40 MG tablet Take 40 mg every evening by mouth. Per pt has not taken in few months (on 08-10-2017)    . telmisartan (MICARDIS) 20 MG tablet Take by mouth.    . TESTOSTERONE IM Inject 1 application See admin instructions into the muscle. [facility] 2 1/2 [facility] - last injection approx.  End of October 2018    . tizanidine (ZANAFLEX) 2 MG capsule Take 2 mg by mouth 3 (three) times daily.    . valsartan (DIOVAN) 80 MG tablet Take 80 mg by mouth daily.  3  . zolpidem (AMBIEN) 10 MG tablet Take 10 mg by mouth at bedtime.      No facility-administered medications prior to visit.    PAST MEDICAL HISTORY: Past Medical History:  Diagnosis Date  . Allergic rhinitis   . Anemia,  pernicious    b12 def.  . Anxiety   . Arthritis   . Biceps tendon tear    right  . Cervical spondylosis with radiculopathy    C2 -- C7  . Chronic fatigue   . Chronic pain    neck, back  . Common migraine with intractable migraine 09/01/2018  . DDD (degenerative disc disease), cervical   . DDD (degenerative disc disease), lumbosacral   . Depression, major, recurrent [place] Diverticulosis of colon   . Eczema   .  Family history of adverse reaction to anesthesia    sister has problems waking up  . Family history of breast cancer   . Fibromyalgia 03/29/2007  . GERD (gastroesophageal reflux disease)   . Headache    constant headaches  . History of colonic diverticulitis 08/16/2010   w/ perforation (lower GI bleed)--- resolved without surgerical intervention  . 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 Hx of pyelonephritis 09/2005   due to UTI  . Hypertension   . IBS (irritable bowel syndrome)    internates between constpation/ diarrhea  . Lumbosacral spondylosis    L2-3, L4-5  . Memory difficulty 08/11/2019  . Mild obstructive sleep apnea    per study 06/ 2009 mild osa  AHI 13/hr---  recommendation given mouth appliance, loss wt., cpap  . Mixed hyperlipidemia   . OA (osteoarthritis)    right shoulder AC joint  . Pre-diabetes   . Right rotator cuff tear     PAST SURGICAL HISTORY: Past Surgical History:  Procedure Laterality Date  . BREAST SURGERY     breast biopsy-benign  . COLONOSCOPY  last one 08-08-2009  . DILATATION & CURRETTAGE/HYSTEROSCOPY WITH RESECTOCOPE  02-03-2011   dr Dellis Filbert  [facility]   polypectomy  . RADIAL KERATOTOMY    . RADIAL OPTIC NEUROTOMY     twice in lumbar area of back-every 6 months  . SHOULDER ARTHROSCOPY WITH ROTATOR CUFF REPAIR Right 08/13/2017   Procedure: RIGHT SHOULDER ARTHROSCOPY, DEBRIDEMENT, BICEPS TENOTOMY, ROTATOR CUFF REPAIR, DISTAL CLAVICLE RESECTION;  Surgeon:  Sydnee Cabal, MD;  Location: [facility];  Service: Orthopedics;  Laterality: Right;  . TOTAL HIP ARTHROPLASTY Left 07/17/2015   Procedure: LEFT TOTAL HIP ARTHROPLASTY ANTERIOR APPROACH;  Surgeon: Paralee Cancel, MD;  Location: [facility];  Service: Orthopedics;  Laterality: Left;  . TOTAL HIP ARTHROPLASTY Right 08/21/2015   Procedure: RIGHT TOTAL HIP ARTHROPLASTY ANTERIOR APPROACH;  Surgeon: Paralee Cancel, MD;  Location: [facility];  Service: Orthopedics;  Laterality: Right;  . TRANSTHORACIC ECHOCARDIOGRAM  12/30/2015   ef 79-39%, grade 1 diastolic dysfunction/  mild MR/ trivial TR    FAMILY HISTORY: Family History  Problem Relation Age of Onset  . Heart disease Mother   . Heart attack Mother   . Asthma Mother   . Breast cancer Mother 4  . Other Father        car accident  . Breast cancer Sister 12  . Lung cancer Sister   . Other Sister        ATM mutation  . Heart attack Brother 58  . Coronary artery disease Brother   . Breast cancer Maternal Grandmother   . Rheum arthritis Maternal Grandmother   . Stroke Maternal Grandfather   . Brain cancer Paternal Grandfather   . Lung cancer Paternal Grandfather   . Breast cancer Maternal Aunt 34  . Alzheimer's disease Paternal Grandmother   . Testicular cancer Cousin 28  . Other Other        ATM mutation    SOCIAL HISTORY: Social History   Socioeconomic History  . Marital status: Divorced    Spouse name: Not on file  . Number of children: 0  . Years of education: 54  . Highest education level: Not on file  Occupational History  . Occupation: retired    Fish farm manager: UNEMPLOYED  Tobacco Use  . Smoking status: Former Smoker    Years: 11.00    Types: Cigarettes  Quit date: 09/29/1984    Years since quitting: 35.5  . Smokeless tobacco: Never Used  Vaping Use  . Vaping Use: Never used  Substance and Sexual Activity  . Alcohol use: Yes    Comment: wine occassionally  . Drug use: No    Comment: hx occasional marjuana  (as documented 2017)  . Sexual activity: Not on file  Other Topics Concern  . Not on file  Social History Narrative   Lives alone   Caffeine use: Drinks coffee sometimes   Right handed    Social Determinants of Health   Financial Resource Strain:   . Difficulty of Paying Living Expenses:   Food Insecurity:   . Worried About Charity fundraiser in the Last Year:   . Arboriculturist in the Last Year:   Transportation Needs:   . Film/video editor (Medical):   Marland Kitchen Lack of Transportation (Non-Medical):   Physical Activity:   . Days of Exercise per Week:   . Minutes of Exercise per Session:   Stress:   . Feeling of Stress :   Social Connections:   . Frequency of Communication with Friends and Family:   . Frequency of Social Gatherings with Friends and Family:   . Attends Religious Services:   . Active Member of Clubs or Organizations:   . Attends Archivist Meetings:   Marland Kitchen Marital Status:   Intimate Partner Violence:   . Fear of Current or Ex-Partner:   . Emotionally Abused:   Marland Kitchen Physically Abused:   . Sexually Abused:       PHYSICAL EXAM  There were no vitals filed for this visit. There is no height or weight on file to calculate BMI.  Generalized: Well developed, in no acute distress   Neurological examination  Mentation: Alert oriented to time, place, history taking. Follows all commands speech and language fluent Cranial nerve II-XII: Pupils were equal round reactive to light. Extraocular movements were full, visual field were full on confrontational test. Facial sensation and strength were normal. Uvula tongue midline. Head turning and shoulder shrug  were normal and symmetric. Motor: The motor testing reveals 5 over 5 strength of all 4 extremities. Good symmetric motor tone is noted throughout.  Sensory: Sensory testing is intact to soft touch on all 4 extremities. No evidence of extinction is noted.  Coordination: Cerebellar testing reveals good  finger-nose-finger and heel-to-shin bilaterally.  Gait and station: Gait is normal. Tandem gait is normal. Romberg is negative. No drift is seen.  Reflexes: Deep tendon reflexes are symmetric and normal bilaterally.   DIAGNOSTIC DATA (LABS, IMAGING, TESTING) - I reviewed patient records, labs, notes, testing and imaging myself where available.  Lab Results  Component Value Date   WBC 7.8 12/28/2015   HGB 12.6 08/13/2017   HCT 37.0 08/13/2017   MCV 95.3 12/28/2015   PLT 363 12/28/2015      Component Value Date/Time   NA 140 08/13/2017 1229   K 4.5 08/13/2017 1229   CL 102 08/13/2017 1229   CO2 28 12/28/2015 1623   GLUCOSE 98 08/13/2017 1229   GLUCOSE 76 08/28/2006 1454   BUN 24 (H) 08/13/2017 1229   CREATININE 0.80 08/13/2017 1229   CALCIUM 10.1 12/28/2015 1623   CALCIUM 10.4 11/23/2008 0000   PROT 7.4 12/28/2015 1623   ALBUMIN 4.7 12/28/2015 1623   AST 20 12/28/2015 1623   ALT 14 12/28/2015 1623   ALKPHOS 86 12/28/2015 1623   BILITOT 0.6 12/28/2015 1623  GFRNONAA >60 12/28/2015 1623   GFRAA >60 12/28/2015 1623   Lab Results  Component Value Date   CHOL 286 (H) 12/29/2015   HDL 57 12/29/2015   LDLCALC 194 (H) 12/29/2015   LDLDIRECT 111.0 11/12/2011   TRIG 177 (H) 12/29/2015   CHOLHDL 5.0 12/29/2015   Lab Results  Component Value Date   HGBA1C 5.7 (H) 12/29/2015   Lab Results  Component Value Date   VITAMINB12 960 04/26/2018   Lab Results  Component Value Date   TSH 1.12 01/08/2010      ASSESSMENT AND PLAN 64 y.o. year old female  has a past medical history of Allergic rhinitis, Anemia, pernicious, Anxiety, Arthritis, Biceps tendon tear, Cervical spondylosis with radiculopathy, Chronic fatigue, Chronic pain, Common migraine with intractable migraine (09/01/2018), DDD (degenerative disc disease), cervical, DDD (degenerative disc disease), lumbosacral, Depression, major, recurrent ([place]), Diverticulosis of colon, Eczema, Family history of adverse reaction to  anesthesia, Family history of breast cancer, Fibromyalgia (03/29/2007), GERD (gastroesophageal reflux disease), Headache, History of colonic diverticulitis (08/16/2010), History of TIA (transient ischemic attack) (12/28/2015), pyelonephritis (09/2005), Hypertension, IBS (irritable bowel syndrome), Lumbosacral spondylosis, Memory difficulty (08/11/2019), Mild obstructive sleep apnea, Mixed hyperlipidemia, OA (osteoarthritis), Pre-diabetes, and Right rotator cuff tear. here with ***   I spent 15 minutes with the patient. 50% of this time was spent   Butler Denmark, [place], DNP 04/09/2020, 12:56 PM [facility] [address] [place], [place] [address] [phone]

## 2020-04-10 ENCOUNTER — Other Ambulatory Visit: Payer: Self-pay

## 2020-04-10 ENCOUNTER — Emergency Department (HOSPITAL_BASED_OUTPATIENT_CLINIC_OR_DEPARTMENT_OTHER)
Admission: EM | Admit: 2020-04-10 | Discharge: 2020-04-10 | Disposition: A | Discharge status: Home / Self Care | Payer: Medicaid Other | Attending: Emergency Medicine | Admitting: Emergency Medicine

## 2020-04-10 ENCOUNTER — Emergency Department (HOSPITAL_BASED_OUTPATIENT_CLINIC_OR_DEPARTMENT_OTHER): Payer: Medicaid Other

## 2020-04-10 ENCOUNTER — Encounter (HOSPITAL_BASED_OUTPATIENT_CLINIC_OR_DEPARTMENT_OTHER): Payer: Self-pay | Admitting: *Deleted

## 2020-04-10 ENCOUNTER — Ambulatory Visit: Payer: Medicaid Other | Admitting: Neurology

## 2020-04-10 DIAGNOSIS — Z20822 Contact with and (suspected) exposure to covid-19: Secondary | ICD-10-CM | POA: Insufficient documentation

## 2020-04-10 DIAGNOSIS — R6883 Chills (without fever): Secondary | ICD-10-CM | POA: Insufficient documentation

## 2020-04-10 DIAGNOSIS — Z8616 Personal history of COVID-19: Secondary | ICD-10-CM | POA: Insufficient documentation

## 2020-04-10 DIAGNOSIS — R197 Diarrhea, unspecified: Secondary | ICD-10-CM | POA: Diagnosis present

## 2020-04-10 DIAGNOSIS — I10 Essential (primary) hypertension: Secondary | ICD-10-CM | POA: Insufficient documentation

## 2020-04-10 DIAGNOSIS — Z8673 Personal history of transient ischemic attack (TIA), and cerebral infarction without residual deficits: Secondary | ICD-10-CM | POA: Diagnosis not present

## 2020-04-10 DIAGNOSIS — Z87891 Personal history of nicotine dependence: Secondary | ICD-10-CM | POA: Diagnosis not present

## 2020-04-10 DIAGNOSIS — Z79899 Other long term (current) drug therapy: Secondary | ICD-10-CM | POA: Insufficient documentation

## 2020-04-10 DIAGNOSIS — Z96641 Presence of right artificial hip joint: Secondary | ICD-10-CM | POA: Insufficient documentation

## 2020-04-10 LAB — COMPREHENSIVE METABOLIC PANEL
ALT: 18 U/L (ref 0–44)
AST: 21 U/L (ref 15–41)
Albumin: 4.6 g/dL (ref 3.5–5.0)
Alkaline Phosphatase: 83 U/L (ref 38–126)
Anion gap: 12 (ref 5–15)
BUN: 16 mg/dL (ref 8–23)
CO2: 24 mmol/L (ref 22–32)
Calcium: 9.5 mg/dL (ref 8.9–10.3)
Chloride: 101 mmol/L (ref 98–111)
Creatinine, Ser: 0.87 mg/dL (ref 0.44–1.00)
GFR calc Af Amer: >60 mL/min (ref >60)
GFR calc non Af Amer: >60 mL/min (ref >60)
Glucose, Bld: 92 mg/dL (ref 70–99)
Potassium: 3.9 mmol/L (ref 3.5–5.1)
Sodium: 137 mmol/L (ref 135–145)
Total Bilirubin: 0.4 mg/dL (ref 0.3–1.2)
Total Protein: 7.9 g/dL (ref 6.5–8.1)

## 2020-04-10 LAB — URINALYSIS, ROUTINE W REFLEX MICROSCOPIC
Bilirubin Urine: NEGATIVE
Glucose, UA: NEGATIVE mg/dL
Hgb urine dipstick: NEGATIVE
Ketones, ur: NEGATIVE mg/dL
Nitrite: NEGATIVE
Protein, ur: NEGATIVE mg/dL
Specific Gravity, Urine: 1.015 (ref 1.005–1.030)
pH: 5.5 (ref 5.0–8.0)

## 2020-04-10 LAB — CBC
HCT: 37.7 % (ref 36.0–46.0)
Hemoglobin: 12.4 g/dL (ref 12.0–15.0)
MCH: 31.4 pg (ref 26.0–34.0)
MCHC: 32.9 g/dL (ref 30.0–36.0)
MCV: 95.4 fL (ref 80.0–100.0)
Platelets: 386 10*3/uL (ref 150–400)
RBC: 3.95 MIL/uL (ref 3.87–5.11)
RDW: 13.7 % (ref 11.5–15.5)
WBC: 5.6 10*3/uL (ref 4.0–10.5)
nRBC: 0 % (ref 0.0–0.2)

## 2020-04-10 LAB — SARS CORONAVIRUS 2 BY RT PCR (HOSPITAL ORDER, PERFORMED IN ~~LOC~~ HOSPITAL LAB): SARS Coronavirus 2: NEGATIVE

## 2020-04-10 LAB — LIPASE, BLOOD: Lipase: 24 U/L (ref 11–51)

## 2020-04-10 LAB — URINALYSIS, MICROSCOPIC (REFLEX): RBC / HPF: NONE SEEN RBC/hpf (ref 0–5)

## 2020-04-10 MED ORDER — SODIUM CHLORIDE 0.9 % IV BOLUS
1000.0000 mL | Freq: Once | INTRAVENOUS | Status: AC
  Administered 2020-04-10: 1000 mL via INTRAVENOUS

## 2020-04-10 MED ORDER — SODIUM CHLORIDE 0.9% FLUSH
3.0000 mL | Freq: Once | INTRAVENOUS | Status: DC
  Filled 2020-04-10: qty 3

## 2020-04-10 NOTE — ED Triage Notes (Signed)
Fever, chills, abdominal pain and chills for 6 weeks.

## 2020-04-10 NOTE — ED Notes (Signed)
ED Provider at bedside. 

## 2020-04-10 NOTE — Discharge Instructions (Signed)
Your Covid test today is negative.  Your labs were unremarkable.  You likely have a stomach virus.  Please stay hydrated.  Consider getting Covid vaccine.  See your doctor for follow-up.  Return to ER if you have worse abdominal pain, diarrhea, chills, fever.

## 2020-04-10 NOTE — ED Provider Notes (Signed)
Kristin Pope Provider Note   CSN: [id number] Arrival date & time: 04/10/20  1813     History No chief complaint on file.   Kristin Pope is a 64 y.o. female history hypertension, migraines, previous Covid infection who presented with chills and diarrhea.  Patient states that for the last 6 weeks, she has been having subjective fevers and chills.  She also had frequent diarrhea.  She states that initially was 6 times a day and now has improved.  She has abdominal cramps as well.  She denies any actual fevers but just subjective chills.  She states that the symptoms have not resolved so she went to urgent care and was sent in for possible Covid.  She states that she had Covid last year.  She was not hospitalized for it.  She never received Covid vaccine after her Covid infection.  Patient denies any known Covid exposure.  The history is provided by the patient.       Past Medical History:  Diagnosis Date  . Allergic rhinitis   . Anemia, pernicious    b12 def.  . Anxiety   . Arthritis   . Biceps tendon tear    right  . Cervical spondylosis with radiculopathy    C2 -- C7  . Chronic fatigue   . Chronic pain    neck, back  . Common migraine with intractable migraine 09/01/2018  . DDD (degenerative disc disease), cervical   . DDD (degenerative disc disease), lumbosacral   . Depression, major, recurrent ([place])   . Diverticulosis of colon   . Eczema   . Family history of adverse reaction to anesthesia    sister has problems waking up  . Family history of breast cancer   . Fibromyalgia 03/29/2007  . GERD (gastroesophageal reflux disease)   . Headache    constant headaches  . History of colonic diverticulitis 08/16/2010   w/ perforation (lower GI bleed)--- resolved without surgerical intervention  . 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 Hx of pyelonephritis 09/2005   due  to UTI  . Hypertension   . IBS (irritable bowel syndrome)    internates between constpation/ diarrhea  . Lumbosacral spondylosis    L2-3, L4-5  . Memory difficulty 08/11/2019  . Mild obstructive sleep apnea    per study 06/ 2009 mild osa  AHI 13/hr---  recommendation given mouth appliance, loss wt., cpap  . Mixed hyperlipidemia   . OA (osteoarthritis)    right shoulder AC joint  . Pre-diabetes   . Right rotator cuff tear     Patient Active Problem List   Diagnosis Date Noted  . Memory difficulty 08/11/2019  . Common migraine with intractable migraine 09/01/2018  . S/P arthroscopy of right shoulder 08/13/2017  . Genetic testing 05/29/2017  . Family history of breast cancer   . Hyperlipidemia LDL goal <70 12/30/2015  . Cerebrovascular disease or lesion 12/30/2015  . Mild pulmonary hypertension ([place]) 12/30/2015  . TIA (transient ischemic attack) 12/28/2015  . GERD (gastroesophageal reflux disease) 07/23/2015  . Chronic pain 07/23/2015  . Obese 07/19/2015  . S/P left THA, AA 07/17/2015  . 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 Insomnia 03/03/2011  . OSA (obstructive sleep apnea) 03/03/2011  . Depression, major, recurrent ([place]) 12/02/2010  . CONSTIPATION, SLOW TRANSIT 10/04/2010  . IRRITABLE BOWEL SYNDROME 08/14/2009  . CYSTITIS, CHRONIC INTERSTITIAL 06/27/2009  . UNSPECIFIED HYPOTHYROIDISM 05/16/2009  .  LIPOMA OF OTHER SPECIFIED SITES 04/13/2009  . BRUXISM 04/13/2009  . SYNCOPE 03/13/2009  . Abnormal finding on MRI of brain 02/07/2009  . MUSCLE WEAKNESS (GENERALIZED) 12/20/2008  . UNSPECIFIED ALLERGIC ALVEOLITIS AND PNEUMONITIS 08/02/2008  . HIP PAIN, LEFT, CHRONIC 07/04/2008  . MALAISE AND FATIGUE 07/04/2008  . LOW BACK PAIN 05/26/2008  . PROTEINURIA 12/23/2007  . HYPERCALCEMIA 11/18/2007  . Essential hypertension 09/14/2007  . ANEMIA, B12 DEFICIENCY 05/18/2007  . 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 ALLERGIC RHINITIS 03/29/2007  .  FIBROMYALGIA 03/29/2007    Past Surgical History:  Procedure Laterality Date  . BREAST SURGERY     breast biopsy-benign  . COLONOSCOPY  last one 08-08-2009  . DILATATION & CURRETTAGE/HYSTEROSCOPY WITH RESECTOCOPE  02-03-2011   dr Dellis Filbert  [facility]   polypectomy  . RADIAL KERATOTOMY    . RADIAL OPTIC NEUROTOMY     twice in lumbar area of back-every 6 months  . SHOULDER ARTHROSCOPY WITH ROTATOR CUFF REPAIR Right 08/13/2017   Procedure: RIGHT SHOULDER ARTHROSCOPY, DEBRIDEMENT, BICEPS TENOTOMY, ROTATOR CUFF REPAIR, DISTAL CLAVICLE RESECTION;  Surgeon: Sydnee Cabal, MD;  Location: [place];  Service: Orthopedics;  Laterality: Right;  . TOTAL HIP ARTHROPLASTY Left 07/17/2015   Procedure: LEFT TOTAL HIP ARTHROPLASTY ANTERIOR APPROACH;  Surgeon: Paralee Cancel, MD;  Location: [facility];  Service: Orthopedics;  Laterality: Left;  . TOTAL HIP ARTHROPLASTY Right 08/21/2015   Procedure: RIGHT TOTAL HIP ARTHROPLASTY ANTERIOR APPROACH;  Surgeon: Paralee Cancel, MD;  Location: [facility];  Service: Orthopedics;  Laterality: Right;  . TRANSTHORACIC ECHOCARDIOGRAM  12/30/2015   ef 16-10%, grade 1 diastolic dysfunction/  mild MR/ trivial TR     OB History   No obstetric history on file.     Family History  Problem Relation Age of Onset  . Heart disease Mother   . Heart attack Mother   . Asthma Mother   . Breast cancer Mother 53  . Other Father        car accident  . Breast cancer Sister 29  . Lung cancer Sister   . Other Sister        ATM mutation  . Heart attack Brother 47  . Coronary artery disease Brother   . Breast cancer Maternal Grandmother   . Rheum arthritis Maternal Grandmother   . Stroke Maternal Grandfather   . Brain cancer Paternal Grandfather   . Lung cancer Paternal Grandfather   . Breast cancer Maternal Aunt 34  . Alzheimer's disease Paternal Grandmother   . Testicular cancer Cousin 28  . Other Other        ATM mutation    Social History   Tobacco Use  .  Smoking status: Former Smoker    Years: 11.00    Types: Cigarettes    Quit date: 09/29/1984    Years since quitting: 35.5  . Smokeless tobacco: Never Used  Vaping Use  . Vaping Use: Never used  Substance Use Topics  . Alcohol use: Yes    Comment: wine occassionally  . Drug use: No    Comment: hx occasional marjuana (as documented 2017)    Home Medications Prior to Admission medications   Medication Sig Start Date End Date Taking? Authorizing Provider  acetaminophen (TYLENOL) 650 MG CR tablet Take 1,300 mg every 8 (eight) hours as needed by mouth for pain.    [provider]  amitriptyline (ELAVIL) 50 MG tablet Take 50 mg by mouth at bedtime.    [provider]  ARIPiprazole (ABILIFY) 5 MG tablet Take 5 mg by mouth daily.    [provider]  aspirin 325 MG tablet Take 1 tablet (325 mg total) by mouth daily. 12/30/15   Rama, Venetia Maxon, MD  buPROPion (ZYBAN) 150 MG 12 hr tablet Take 150 mg by mouth. 50+150    [provider]  cefdinir (OMNICEF) 300 MG capsule Take 300 mg by mouth 2 (two) times daily.    [provider]  celecoxib (CELEBREX) 200 MG capsule Take 200 mg 2 (two) times daily as needed by mouth.    [provider]  Chlorphen-Pseudoephed-APAP (ALLERGY SINUS-D MAX ST PO) Take 2 (two) times daily as needed by mouth (SINUS HEADACHE).    [provider]  cholecalciferol (VITAMIN D) 1000 UNITS tablet Take 1,000 Units daily by mouth.     [provider]  clonazePAM (KLONOPIN) 1 MG tablet Take 1 tablet (1 mg total) by mouth 4 (four) times daily. Patient taking differently: Take 1 mg by mouth 4 (four) times daily as needed for anxiety.  07/19/15   Danae Orleans, PA-C  cyanocobalamin (,VITAMIN B-12,) 1000 MCG/ML injection Inject 1,000 mcg See admin instructions into the muscle. Vitamin B12 - every month --  last injection approx 07-24-2017    [provider]  Cyanocobalamin (VITAMIN B-12) 5000 MCG TBDP Take  5,000 mcg daily with lunch by mouth. Per pt varies 2024mg, 25056m, or 500055m   [provider]  donepezil (ARICEPT) 10 MG tablet Take 1 tablet (10 mg total) by mouth at bedtime. 09/06/19   WilKathrynn DuckingD  DULoxetine (CYMBALTA) 60 MG capsule Take 60 mg by mouth at bedtime.  07/28/15   [provider]  ezetimibe (ZETIA) 10 MG tablet Take 1 tablet (10 mg total) by mouth daily. Patient taking differently: Take 10 mg every evening by mouth.  12/30/15   Rama, ChrVenetia MaxonD  fluticasone (FLONASE) 50 MCG/ACT nasal spray Place 1 spray as needed into both nostrils.  12/07/15   [provider]  furosemide (LASIX) 20 MG tablet Take 20 mg by mouth every Monday, Wednesday, and Friday.    [provider]  gabapentin (NEURONTIN) 300 MG capsule Take 300 mg at bedtime by mouth. PT ONLY TAKES AT BEDTIME--  STATED PRESCRIBED TID    [provider]  hydrocortisone cream 1 % Apply 1 application topically at bedtime as needed for itching.     [provider]  lubiprostone (AMITIZA) 24 MCG capsule Take 1 capsule (24 mcg total) by mouth daily with breakfast. Patient taking differently: Take 24 mcg by mouth at bedtime.  01/20/12   JenRicard DillonD  methocarbamol (ROBAXIN) 500 MG tablet Take 500 mg by mouth 4 (four) times daily.    [provider]  montelukast (SINGULAIR) 10 MG tablet Take 10 mg by mouth at bedtime.     [provider]  Multiple Vitamin (MULTIVITAMIN IRON-FREE) TABS Take daily by mouth.    [provider]  nitrofurantoin, macrocrystal-monohydrate, (MACROBID) 100 MG capsule Take 100 mg at bedtime by mouth. Continuous course 12/07/15   [provider]  NUVIGIL 150 MG tablet TAKE 1 TABLET BY MOUTH EVERY DAY Patient taking differently: TAKE 1/2 TO 1 TABLET BY MOUTH 2 TIMES DAILY AS NEEDED FOR ENERGY BOOST 07/15/12   JenRicard DillonD  omeprazole (PRILOSEC) 40 MG capsule Take 40 mg at bedtime by mouth.      [provider]  pravastatin (PRAVACHOL) 40 MG tablet Take 40 mg  every evening by mouth. Per pt has not taken in few months (on 08-10-2017)    [provider]  telmisartan (MICARDIS) 20 MG tablet Take by mouth. 03/22/19 03/21/20  [provider]  TESTOSTERONE IM Inject 1 application See admin instructions into the muscle. Every 2 1/2 months at Dr. Stann Mainland at [facility] [place] office - last injection approx.  End of October 2018    [provider]  tizanidine (ZANAFLEX) 2 MG capsule Take 2 mg by mouth 3 (three) times daily.    [provider]  valsartan (DIOVAN) 80 MG tablet Take 80 mg by mouth daily. 07/16/18   [provider]  zolpidem (AMBIEN) 10 MG tablet Take 10 mg by mouth at bedtime.     [provider]    Allergies    Morphine and related, Codeine, Fentanyl, Gluten meal, Hydrocodone, Lactose intolerance (gi), Lipitor [atorvastatin], Monosodium glutamate, Tetracycline, and Tramadol  Review of Systems   Review of Systems  Constitutional: Positive for chills.  Gastrointestinal: Positive for diarrhea.  All other systems reviewed and are negative.   Physical Exam Updated Vital Signs BP 136/88   Pulse 73   Temp 98.4 F (36.9 C) (Oral)   Resp 16   Ht 4' 10.75" (1.492 m)   Wt 73.9 kg   SpO2 100%   BMI 33.20 kg/m   Physical Exam Vitals and nursing note reviewed.  Constitutional:      Appearance: Normal appearance.  HENT:     Head: Normocephalic.     Nose: Nose normal.     Mouth/Throat:     Mouth: Mucous membranes are dry.  Eyes:     Extraocular Movements: Extraocular movements intact.     Pupils: Pupils are equal, round, and reactive to light.  Cardiovascular:     Rate and Rhythm: Normal rate and regular rhythm.     Pulses: Normal pulses.     Heart sounds: Normal heart sounds.  Pulmonary:     Effort: Pulmonary effort is normal.     Breath sounds: Normal breath sounds.  Abdominal:     General: Abdomen is flat.       Palpations: Abdomen is soft.  Musculoskeletal:        General: Normal range of motion.     Cervical back: Normal range of motion.  Skin:    General: Skin is warm.     Capillary Refill: Capillary refill takes less than 2 seconds.  Neurological:     General: No focal deficit present.     Mental Status: She is alert and oriented to person, place, and time.  Psychiatric:        Mood and Affect: Mood normal.        Behavior: Behavior normal.     ED Results / Procedures / Treatments   Labs (all labs ordered are listed, but only abnormal results are displayed) Labs Reviewed  URINALYSIS, ROUTINE W REFLEX MICROSCOPIC - Abnormal; Notable for the following components:      Result Value   Leukocytes,Ua TRACE (*)    All other components within normal limits  URINALYSIS, MICROSCOPIC (REFLEX) - Abnormal; Notable for the following components:   Bacteria, UA RARE (*)    All other components within normal limits  SARS CORONAVIRUS 2 BY RT PCR (HOSPITAL ORDER, [facility])  LIPASE, BLOOD  COMPREHENSIVE METABOLIC PANEL  CBC    EKG None  Radiology DG Chest Port 1 View  Result Date: 04/10/2020 CLINICAL DATA:  Cough  and shortness of breath. Fever and chills. Abdominal pain. EXAM: PORTABLE CHEST 1 VIEW COMPARISON:  None. FINDINGS: The cardiomediastinal contours are normal. Subsegmental atelectasis or scarring at the left lung base. Pulmonary vasculature is normal. No consolidation, pleural effusion, or pneumothorax. No acute osseous abnormalities are seen. IMPRESSION: No acute chest findings. Electronically Signed   By: Keith Rake M.D.   On: 04/10/2020 21:54    Procedures Procedures (including critical care time)  Medications Ordered in ED Medications  sodium chloride 0.9 % bolus 1,000 mL (1,000 mLs Intravenous New Bag/Given 04/10/20 2203)    ED Course  I have reviewed the triage vital signs and the nursing notes.  Pertinent labs & imaging results that  were available during my care of the patient were reviewed by me and considered in my medical decision making (see chart for details).    MDM Rules/Calculators/A&P                          ALEETA SCHMALTZ is a 64 y.o. female here presenting chills and diarrhea for 6 weeks.  Patient's abdomen is nontender.  Patient has no fever here.  Patient had Covid a year ago but did not receive the vaccine afterwards.  She is not hypoxic and is not short of breath.  Will get CBC, CMP, chest x-ray.  Will test for Covid.  Will hydrate and reassess.  10:48 PM Labs unremarkable.  Chest x-ray is normal. Patient is hydrated and felt better.  Stable for discharge.  Likely viral gastro.  Final Clinical Impression(s) / ED Diagnoses Final diagnoses:  None    Rx / DC Orders ED Discharge Orders    None       Drenda Freeze, MD 04/10/20 2249

## 2020-07-05 ENCOUNTER — Encounter: Payer: Self-pay | Admitting: Neurology

## 2020-07-05 ENCOUNTER — Ambulatory Visit (INDEPENDENT_AMBULATORY_CARE_PROVIDER_SITE_OTHER): Payer: Medicare Other | Admitting: Neurology

## 2020-07-05 VITALS — BP 161/90 | HR 78 | Ht 58.75 in | Wt 154.4 lb

## 2020-07-05 DIAGNOSIS — G479 Sleep disorder, unspecified: Secondary | ICD-10-CM

## 2020-07-05 DIAGNOSIS — G43019 Migraine without aura, intractable, without status migrainosus: Secondary | ICD-10-CM

## 2020-07-05 DIAGNOSIS — R413 Other amnesia: Secondary | ICD-10-CM

## 2020-07-05 MED ORDER — DONEPEZIL HCL 10 MG PO TABS: 10.0000 mg | ORAL_TABLET | Freq: Every day | ORAL | 3 refills | Status: DC

## 2020-07-05 NOTE — Progress Notes (Signed)
PATIENT: Kristin Pope DOB: 07/31/1956  REASON FOR VISIT: follow up HISTORY FROM: patient  HISTORY OF PRESENT ILLNESS: Today 07/05/20 Kristin Pope is a 64 year old female with history of fibromyalgia and chronic fatigue.  She has mild memory issues and headaches.  She never took Aimovig due to fear of side effect.  She is on low-dose Aricept.  Had Covid in May 2020, had worsening fatigue, thinks she may have had the delta variant in May 2021 and GI bug, since then has had recurrent diarrhea, seeing GI soon.  Has daily morning headache, is mild, are no longer debilitating.  Does not sleep well, frequently wakes during the night, has snoring, previously referred for sleep study, never made the appointment.  Thinks Aricept is working well for the memory, not related to diarrhea, has history of PTSD, starting to remember more.  Here today for follow-up unaccompanied.  HISTORY 08/11/2019 Dr. Jannifer Pope: Kristin Pope is a 64 year old right-handed white female with a history of fibromyalgia and chronic fatigue.  She had the Covid virus infection in May 2020, she has not fully recovered with increased fatigue since that time.  The patient has ongoing issues with mild memory problems, she has word finding problems and sometimes has difficulty completing a sentence.  She has difficulty keeping up with the date and time.  She still functions independently, she is able to operate a motor vehicle.  She is still has some headaches but the headaches are not as severe as the were, they are occurring several times a week and are fairly mild.  The patient never took Aimovig in fear of the potential side effects of the drug.  The patient returns to the office today for an evaluation.   REVIEW OF SYSTEMS: Out of a complete 14 system review of symptoms, the patient complains only of the following symptoms, and all other reviewed systems are negative.  Fatigue, snoring, memory loss  ALLERGIES: Allergies  Allergen  Reactions  . Morphine And Related Other (See Comments)    Headaches - pt can take hydromorphone  . Codeine Other (See Comments)    REACTION: Insomnia "crazy dreams"  . Fentanyl Nausea And Vomiting    Fentanyl patch - nausea and vomiting.  . Gluten Meal Other (See Comments)    Pt avoids eating gluten  . Hydrocodone Other (See Comments)    Urinary retention---  "can only take 1/2 tablet low dose"  . Lactose Intolerance (Gi) Other (See Comments)    Upset stomach  . Lipitor [Atorvastatin] Other (See Comments)    Pain, myalgias  . Monosodium Glutamate Other (See Comments)    MSG  --- Increases blood pressure  . Tetracycline Nausea And Vomiting  . Tramadol Other (See Comments)    "Passed out"    HOME MEDICATIONS: Outpatient Medications Prior to Visit  Medication Sig Dispense Refill  . acetaminophen (TYLENOL) 650 MG CR tablet Take 1,300 mg every 8 (eight) hours as needed by mouth for pain.    Marland Kitchen amitriptyline (ELAVIL) 50 MG tablet Take 50 mg by mouth at bedtime.    . ARIPiprazole (ABILIFY) 5 MG tablet Take 5 mg by mouth daily.    Marland Kitchen aspirin 325 MG tablet Take 1 tablet (325 mg total) by mouth daily. 30 tablet 10  . celecoxib (CELEBREX) 200 MG capsule Take 200 mg 2 (two) times daily as needed by mouth.    . cholecalciferol (VITAMIN D) 1000 UNITS tablet Take 1,000 Units daily by mouth.     . clonazePAM (KLONOPIN)  1 MG tablet Take 1 tablet (1 mg total) by mouth 4 (four) times daily. (Patient taking differently: Take 1 mg by mouth 4 (four) times daily as needed for anxiety. ) 60 tablet 0  . cyanocobalamin (,VITAMIN B-12,) 1000 MCG/ML injection Inject 1,000 mcg See admin instructions into the muscle. Vitamin B12 - every month --  last injection approx 07-24-2017    . Cyanocobalamin (VITAMIN B-12) 5000 MCG TBDP Take 5,000 mcg daily with lunch by mouth. Per pt varies 2012mg, 25020m, or 500068m   . donepezil (ARICEPT) 10 MG tablet Take 1 tablet (10 mg total) by mouth at bedtime. 90 tablet 3  .  DULoxetine (CYMBALTA) 60 MG capsule Take 60 mg by mouth at bedtime.   5  . ezetimibe (ZETIA) 10 MG tablet Take 1 tablet (10 mg total) by mouth daily. (Patient taking differently: Take 10 mg every evening by mouth. ) 30 tablet 10  . fluticasone (FLONASE) 50 MCG/ACT nasal spray Place 1 spray as needed into both nostrils.   5  . furosemide (LASIX) 20 MG tablet Take 20 mg by mouth every Monday, Wednesday, and Friday.    . gabapentin (NEURONTIN) 300 MG capsule Take 300 mg at bedtime by mouth. PT ONLY TAKES AT BEDTIME--  STATED PRESCRIBED TID    . hydrocortisone cream 1 % Apply 1 application topically at bedtime as needed for itching.     . lubiprostone (AMITIZA) 24 MCG capsule Take 1 capsule (24 mcg total) by mouth daily with breakfast. (Patient taking differently: Take 24 mcg by mouth at bedtime. ) 30 capsule 11  . methocarbamol (ROBAXIN) 500 MG tablet Take 500 mg by mouth 4 (four) times daily.    . montelukast (SINGULAIR) 10 MG tablet Take 10 mg by mouth at bedtime.     . Multiple Vitamin (MULTIVITAMIN IRON-FREE) TABS Take daily by mouth.    . nitrofurantoin, macrocrystal-monohydrate, (MACROBID) 100 MG capsule Take 100 mg at bedtime by mouth. Continuous course  5  . NUVIGIL 150 MG tablet TAKE 1 TABLET BY MOUTH EVERY DAY (Patient taking differently: TAKE 1/2 TO 1 TABLET BY MOUTH 2 TIMES DAILY AS NEEDED FOR ENERGY BOOST) 30 tablet 1  . omeprazole (PRILOSEC) 40 MG capsule Take 40 mg at bedtime by mouth.     . pravastatin (PRAVACHOL) 40 MG tablet Take 40 mg every evening by mouth. Per pt has not taken in few months (on 08-10-2017)    . TESTOSTERONE IM Inject 1 application See admin instructions into the muscle. Every 2 1/2 months at Dr. WeiStann Mainland [facility] - last injection approx.  End of October 2018    . tizanidine (ZANAFLEX) 2 MG capsule Take 2 mg by mouth 3 (three) times daily.    . valsartan (DIOVAN) 80 MG tablet Take 80 mg by mouth daily.  3  . zolpidem (AMBIEN) 10 MG tablet Take 10 mg  by mouth at bedtime.     . tMarland Kitchenlmisartan (MICARDIS) 20 MG tablet Take by mouth.    . bMarland KitchenPROPion (ZYBAN) 150 MG 12 hr tablet Take 150 mg by mouth. 50+150    . cefdinir (OMNICEF) 300 MG capsule Take 300 mg by mouth 2 (two) times daily.    . Chlorphen-Pseudoephed-APAP (ALLERGY SINUS-D MAX ST PO) Take 2 (two) times daily as needed by mouth (SINUS HEADACHE).     No facility-administered medications prior to visit.    PAST MEDICAL HISTORY: Past Medical History:  Diagnosis Date  . Allergic rhinitis   . Anemia, pernicious  b12 def.  . Anxiety   . Arthritis   . Biceps tendon tear    right  . Cervical spondylosis with radiculopathy    C2 -- C7  . Chronic fatigue   . Chronic pain    neck, back  . Common migraine with intractable migraine 09/01/2018  . DDD (degenerative disc disease), cervical   . DDD (degenerative disc disease), lumbosacral   . Depression, major, recurrent ([place])   . Diverticulosis of colon   . Eczema   . Family history of adverse reaction to anesthesia    sister has problems waking up  . Family history of breast cancer   . Fibromyalgia 03/29/2007  . GERD (gastroesophageal reflux disease)   . Headache    constant headaches  . History of colonic diverticulitis 08/16/2010   w/ perforation (lower GI bleed)--- resolved without surgerical intervention  . 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 Hx of pyelonephritis 09/2005   due to UTI  . Hypertension   . IBS (irritable bowel syndrome)    internates between constpation/ diarrhea  . Lumbosacral spondylosis    L2-3, L4-5  . Memory difficulty 08/11/2019  . Mild obstructive sleep apnea    per study 06/ 2009 mild osa  AHI 13/hr---  recommendation given mouth appliance, loss wt., cpap  . Mixed hyperlipidemia   . OA (osteoarthritis)    right shoulder AC joint  . Pre-diabetes   . Right rotator cuff tear     PAST SURGICAL HISTORY: Past Surgical  History:  Procedure Laterality Date  . BREAST SURGERY     breast biopsy-benign  . COLONOSCOPY  last one 08-08-2009  . DILATATION & CURRETTAGE/HYSTEROSCOPY WITH RESECTOCOPE  02-03-2011   dr Dellis Filbert  [facility]   polypectomy  . RADIAL KERATOTOMY    . RADIAL OPTIC NEUROTOMY     twice in lumbar area of back-every 6 months  . SHOULDER ARTHROSCOPY WITH ROTATOR CUFF REPAIR Right 08/13/2017   Procedure: RIGHT SHOULDER ARTHROSCOPY, DEBRIDEMENT, BICEPS TENOTOMY, ROTATOR CUFF REPAIR, DISTAL CLAVICLE RESECTION;  Surgeon: Sydnee Cabal, MD;  Location: [place] [facility];  Service: Orthopedics;  Laterality: Right;  . TOTAL HIP ARTHROPLASTY Left 07/17/2015   Procedure: LEFT TOTAL HIP ARTHROPLASTY ANTERIOR APPROACH;  Surgeon: Paralee Cancel, MD;  Location: [facility];  Service: Orthopedics;  Laterality: Left;  . TOTAL HIP ARTHROPLASTY Right 08/21/2015   Procedure: RIGHT TOTAL HIP ARTHROPLASTY ANTERIOR APPROACH;  Surgeon: Paralee Cancel, MD;  Location: [facility];  Service: Orthopedics;  Laterality: Right;  . TRANSTHORACIC ECHOCARDIOGRAM  12/30/2015   ef 50-03%, grade 1 diastolic dysfunction/  mild MR/ trivial TR    FAMILY HISTORY: Family History  Problem Relation Age of Onset  . Heart disease Mother   . Heart attack Mother   . Asthma Mother   . Breast cancer Mother 36  . Other Father        car accident  . Breast cancer Sister 48  . Lung cancer Sister   . Other Sister        ATM mutation  . Heart attack Brother 49  . Coronary artery disease Brother   . Breast cancer Maternal Grandmother   . Rheum arthritis Maternal Grandmother   . Stroke Maternal Grandfather   . Brain cancer Paternal Grandfather   . Lung cancer Paternal Grandfather   . Breast cancer Maternal Aunt 34  . Alzheimer's disease Paternal Grandmother   . Testicular cancer Cousin 28  .  Other Other        ATM mutation    SOCIAL HISTORY: Social History   Socioeconomic History  . Marital status: Divorced    Spouse name: Not on file  .  Number of children: 0  . Years of education: 62  . Highest education level: Not on file  Occupational History  . Occupation: retired    Fish farm manager: UNEMPLOYED  Tobacco Use  . Smoking status: Former Smoker    Years: 11.00    Types: Cigarettes    Quit date: 09/29/1984    Years since quitting: 35.7  . Smokeless tobacco: Never Used  Vaping Use  . Vaping Use: Never used  Substance and Sexual Activity  . Alcohol use: Yes    Comment: wine occassionally  . Drug use: No    Comment: hx occasional marjuana (as documented 2017)  . Sexual activity: Not on file  Other Topics Concern  . Not on file  Social History Narrative   Lives alone   Caffeine use: Drinks coffee sometimes   Right handed    Social Determinants of Health   Financial Resource Strain:   . Difficulty of Paying Living Expenses: Not on file  Food Insecurity:   . Worried About Charity fundraiser in the Last Year: Not on file  . Ran Out of Food in the Last Year: Not on file  Transportation Needs:   . Lack of Transportation (Medical): Not on file  . Lack of Transportation (Non-Medical): Not on file  Physical Activity:   . Days of Exercise per Week: Not on file  . Minutes of Exercise per Session: Not on file  Stress:   . Feeling of Stress : Not on file  Social Connections:   . Frequency of Communication with Friends and Family: Not on file  . Frequency of Social Gatherings with Friends and Family: Not on file  . Attends Religious Services: Not on file  . Active Member of Clubs or Organizations: Not on file  . Attends Archivist Meetings: Not on file  . Marital Status: Not on file  Intimate Partner Violence:   . Fear of Current or Ex-Partner: Not on file  . Emotionally Abused: Not on file  . Physically Abused: Not on file  . Sexually Abused: Not on file   PHYSICAL EXAM  Vitals:   07/05/20 1452  BP: (!) 161/90  Pulse: 78  Weight: 154 lb 6.4 oz (70 kg)  Height: 4' 10.75" (1.492 m)   Body mass index is  31.45 kg/m.  Generalized: Well developed, in no acute distress  MMSE - Mini Mental State Exam 07/05/2020 09/01/2018 04/26/2018  Orientation to time _0 Orientation to Place _1 Registration _2 Attention/ Calculation _3 Recall _4 Language- name 2 objects _5 Language- repeat _6 Language- follow 3 step command _7 Language- read & follow direction _8 Write a sentence _9 Copy design _10 Total score _11 Neurological examination  Mentation: Alert oriented to time, place, history taking. Follows all commands speech and language fluent Cranial nerve II-XII: Pupils were equal round reactive to light. Extraocular movements were full, visual field were full on confrontational test. Facial sensation and strength were normal. Head turning and shoulder shrug  were normal and symmetric. Motor: The motor testing reveals 5 over 5  strength of all 4 extremities. Good symmetric motor tone is noted throughout.  Sensory: Sensory testing is intact to soft touch on all 4 extremities. No evidence of extinction is noted.  Coordination: Cerebellar testing reveals good finger-nose-finger and heel-to-shin bilaterally.  Gait and station: Gait is normal. Tandem gait is normal. Romberg is negative. No drift is seen.  Reflexes: Deep tendon reflexes are symmetric and normal bilaterally.   DIAGNOSTIC DATA (LABS, IMAGING, TESTING) - I reviewed patient records, labs, notes, testing and imaging myself where available.  Lab Results  Component Value Date   WBC 5.6 04/10/2020   HGB 12.4 04/10/2020   HCT 37.7 04/10/2020   MCV 95.4 04/10/2020   PLT 386 04/10/2020      Component Value Date/Time   NA 137 04/10/2020 1850   K 3.9 04/10/2020 1850   CL 101 04/10/2020 1850   CO2 24 04/10/2020 1850   GLUCOSE 92 04/10/2020 1850   GLUCOSE 76 08/28/2006 1454   BUN 16 04/10/2020 1850   CREATININE 0.87 04/10/2020 1850   CALCIUM 9.5 04/10/2020 1850   CALCIUM 10.4 11/23/2008 0000    PROT 7.9 04/10/2020 1850   ALBUMIN 4.6 04/10/2020 1850   AST 21 04/10/2020 1850   ALT 18 04/10/2020 1850   ALKPHOS 83 04/10/2020 1850   BILITOT 0.4 04/10/2020 1850   GFRNONAA >60 04/10/2020 1850   GFRAA >60 04/10/2020 1850   Lab Results  Component Value Date   CHOL 286 (H) 12/29/2015   HDL 57 12/29/2015   LDLCALC 194 (H) 12/29/2015   LDLDIRECT 111.0 11/12/2011   TRIG 177 (H) 12/29/2015   CHOLHDL 5.0 12/29/2015   Lab Results  Component Value Date   HGBA1C 5.7 (H) 12/29/2015   Lab Results  Component Value Date   VITAMINB12 960 04/26/2018   Lab Results  Component Value Date   TSH 1.12 01/08/2010      ASSESSMENT AND PLAN 64 y.o. year old female  has a past medical history of Allergic rhinitis, Anemia, pernicious, Anxiety, Arthritis, Biceps tendon tear, Cervical spondylosis with radiculopathy, Chronic fatigue, Chronic pain, Common migraine with intractable migraine (09/01/2018), DDD (degenerative disc disease), cervical, DDD (degenerative disc disease), lumbosacral, Depression, major, recurrent ([place]), Diverticulosis of colon, Eczema, Family history of adverse reaction to anesthesia, Family history of breast cancer, Fibromyalgia (03/29/2007), GERD (gastroesophageal reflux disease), Headache, History of colonic diverticulitis (08/16/2010), History of TIA (transient ischemic attack) (12/28/2015), pyelonephritis (09/2005), Hypertension, IBS (irritable bowel syndrome), Lumbosacral spondylosis, Memory difficulty (08/11/2019), Mild obstructive sleep apnea, Mixed hyperlipidemia, OA (osteoarthritis), Pre-diabetes, and Right rotator cuff tear. here with:  1.  Chronic daily headache, migraine 2.  Chronic fatigue, fibromyalgia -Never started Aimovig, headaches no longer debilitating -Daily morning headache, suspicious for OSA -Will send for sleep evaluation   3.  Mild memory disturbance -MMSE was perfect 30/30 -Continue Aricept 10 mg at bedtime -Having diarrhea, she feels unrelated to  Aricept, diarrhea came on suddenly -Follow-up in 1 year or sooner if needed  I spent 30 minutes of face-to-face and non-face-to-face time with patient.  This included previsit chart review, lab review, study review, order entry, electronic health record documentation, patient education.  Butler Denmark, AGNP-C, DNP 07/05/2020, 3:03 PM [facility] [address] [place], [place] [address] [phone]

## 2020-07-05 NOTE — Patient Instructions (Addendum)
I will refer you for sleep study  Continue Aricept, Memory score was perfect today 30/30 See you back in 1 year

## 2020-07-06 NOTE — Progress Notes (Signed)
I have read the note, and I agree with the clinical assessment and plan.  Makya Phillis K Bettejane Leavens   

## 2020-08-08 ENCOUNTER — Encounter: Payer: Self-pay | Admitting: Neurology

## 2020-08-08 ENCOUNTER — Ambulatory Visit (INDEPENDENT_AMBULATORY_CARE_PROVIDER_SITE_OTHER): Payer: Medicare Other | Admitting: Neurology

## 2020-08-08 VITALS — BP 111/75 | HR 78 | Ht 58.75 in | Wt 164.0 lb

## 2020-08-08 DIAGNOSIS — Z820 Family history of epilepsy and other diseases of the nervous system: Secondary | ICD-10-CM

## 2020-08-08 DIAGNOSIS — R351 Nocturia: Secondary | ICD-10-CM | POA: Diagnosis not present

## 2020-08-08 DIAGNOSIS — R0683 Snoring: Secondary | ICD-10-CM

## 2020-08-08 DIAGNOSIS — R519 Headache, unspecified: Secondary | ICD-10-CM | POA: Diagnosis not present

## 2020-08-08 DIAGNOSIS — G4700 Insomnia, unspecified: Secondary | ICD-10-CM

## 2020-08-08 DIAGNOSIS — E669 Obesity, unspecified: Secondary | ICD-10-CM

## 2020-08-08 DIAGNOSIS — G4719 Other hypersomnia: Secondary | ICD-10-CM

## 2020-08-08 NOTE — Patient Instructions (Addendum)
  Here is what we discussed today and what we came up with as our plan for you:  I do believe that your daytime sleepiness is in part related to medication effect as you are taking several potentially sedating medication and you are taking a medication to help you stay awake during the day.  Nevertheless, based on your symptoms and your exam I believe you are at risk for obstructive sleep apnea (aka OSA), and I think we should proceed with a sleep study to determine whether you do or do not have OSA and how severe it is. Even, if you have mild OSA, I may want you to consider treatment with CPAP, as treatment of even borderline or mild sleep apnea can result and improvement of symptoms such as sleep disruption, daytime sleepiness, nighttime bathroom breaks, restless leg symptoms, improvement of headache syndromes, even improved mood disorder.   As explained, an attended sleep study meaning you get to stay overnight in the sleep lab, lets [place] monitor sleep-related behaviors such as sleep talking and leg movements in sleep, in addition to monitoring for sleep apnea.  A home sleep test is a screening tool for sleep apnea only, and unfortunately does not help with any other sleep-related diagnoses.  Please remember, the long-term risks and ramifications of untreated moderate to severe obstructive sleep apnea are: increased Cardiovascular disease, including congestive heart failure, stroke, difficult to control hypertension, treatment resistant obesity, arrhythmias, especially irregular heartbeat commonly known as A. Fib. (atrial fibrillation); even type 2 diabetes has been linked to untreated OSA.   Sleep apnea can cause disruption of sleep and sleep deprivation in most cases, which, in turn, can cause recurrent headaches, problems with memory, mood, concentration, focus, and vigilance. Most people with untreated sleep apnea report excessive daytime sleepiness, which can affect their ability to drive. Please do  not drive if you feel sleepy. Patients with sleep apnea can also develop difficulty initiating and maintaining sleep (aka insomnia).   Having sleep apnea may increase your risk for other sleep disorders, including involuntary behaviors sleep such as sleep terrors, sleep talking, sleepwalking.    Having sleep apnea can also increase your risk for restless leg syndrome and leg movements at night.   Please note that untreated obstructive sleep apnea may carry additional perioperative morbidity. Patients with significant obstructive sleep apnea (typically, in the moderate to severe degree) should receive, if possible, perioperative PAP (positive airway pressure) therapy and the surgeons and particularly the anesthesiologists should be informed of the diagnosis and the severity of the sleep disordered breathing.   I will likely see you back after your sleep study to go over the test results and where to go from there. We will call you after your sleep study to advise about the results (most likely, you will hear from [facility], my nurse) and to set up an appointment at the time, as necessary.    Our sleep lab administrative assistant will call you to schedule your sleep study and give you further instructions, regarding the check in process for the sleep study, arrival time, what to bring, when you can expect to leave after the study, etc., and to answer any other logistical questions you may have. If you don't hear back from her by about 2 weeks from now, please feel free to call her direct line at [phone] or you can call our general clinic number, or email [place] through My Chart.

## 2020-08-08 NOTE — Progress Notes (Signed)
Subjective:    Patient ID: Kristin Pope is a 64 y.o. female.  HPI     Star Age, MD, PhD [facility] [address], [address] [place], [place] [address]  Dear Judson Roch and Lanny Hurst,   I saw your patient, Kristin Pope, upon your kind request, in my sleep clinic today for Concern for underlying obstructive sleep apnea.  The patient is unaccompanied today.  As you know, Kristin Pope is a 64 year old right-handed woman with an underlying medical history of reflux disease, diverticulitis, arthritis, degenerative lumbar and cervical spine disease, status post bilateral knee replacement surgeries, migraine headaches, memory loss, fibromyalgia, chronic pain, on narcotic pain medication, prediabetes, and obesity, who reports chronic difficulty initiating and maintaining sleep since childhood.  She is experiencing daytime sleepiness.  She has been taking Nuvigil for the past 10 years, usually takes a third of a pill or half a pill daily.  She tries to be in bed around 1 AM.  She has a TV in the bedroom.  She watches TV at night.  She does not have a set rise time.  She stays in bed a lot.  She has nocturia about 3 times per average night.  She has woken up with a headache frequently.  She reports waking up with a headache daily.  She also reports a diagnosis of chronic fatigue syndrome.  Of note, her Epworth sleepiness score is 16 out of 24, fatigue severity score is 63 out of 63.  She is on multiple medications including numerous potentially sedating medications including Robaxin, Zanaflex, Dilaudid.  She also takes Abilify, Cymbalta, amitriptyline, Neurontin, clonazepam, and long-acting generic Ambien. She reports having had sleep studies in the past, she has not been on CPAP therapy.  She was diagnosed with mild sleep apnea.  Prior sleep study results are not available for my review today. She is divorced, she has no children, no pets in the household.  She lives alone.  She has  an uncle with sleep apnea.  She drinks caffeine in the form of espresso but not every day.  She does not typically drink daily soda or tea.  She drinks alcohol rarely.  She quit smoking in 1986. She does report snoring.  Her Past Medical History Is Significant For: Past Medical History:  Diagnosis Date  . Allergic rhinitis   . Anemia, pernicious    b12 def.  . Anxiety   . Arthritis   . Biceps tendon tear    right  . Cervical spondylosis with radiculopathy    C2 -- C7  . Chronic fatigue   . Chronic pain    neck, back  . Common migraine with intractable migraine 09/01/2018  . DDD (degenerative disc disease), cervical   . DDD (degenerative disc disease), lumbosacral   . Depression, major, recurrent ([place])   . Diverticulosis of colon   . Eczema   . Family history of adverse reaction to anesthesia    sister has problems waking up  . Family history of breast cancer   . Fibromyalgia 03/29/2007  . GERD (gastroesophageal reflux disease)   . Headache    constant headaches  . History of colonic diverticulitis 08/16/2010   w/ perforation (lower GI bleed)--- resolved without surgerical intervention  . 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 Hx of pyelonephritis 09/2005   due to UTI  . Hypertension   . IBS (irritable bowel syndrome)  internates between constpation/ diarrhea  . Lumbosacral spondylosis    L2-3, L4-5  . Memory difficulty 08/11/2019  . Mild obstructive sleep apnea    per study 06/ 2009 mild osa  AHI 13/hr---  recommendation given mouth appliance, loss wt., cpap  . Mixed hyperlipidemia   . OA (osteoarthritis)    right shoulder AC joint  . Pre-diabetes   . Right rotator cuff tear     Her Past Surgical History Is Significant For: Past Surgical History:  Procedure Laterality Date  . BREAST SURGERY     breast biopsy-benign  . COLONOSCOPY  last one 08-08-2009  . DILATATION &  CURRETTAGE/HYSTEROSCOPY WITH RESECTOCOPE  02-03-2011   dr Dellis Filbert  [facility]   polypectomy  . RADIAL KERATOTOMY    . RADIAL OPTIC NEUROTOMY     twice in lumbar area of back-every 6 months  . SHOULDER ARTHROSCOPY WITH ROTATOR CUFF REPAIR Right 08/13/2017   Procedure: RIGHT SHOULDER ARTHROSCOPY, DEBRIDEMENT, BICEPS TENOTOMY, ROTATOR CUFF REPAIR, DISTAL CLAVICLE RESECTION;  Surgeon: Sydnee Cabal, MD;  Location: [facility];  Service: Orthopedics;  Laterality: Right;  . TOTAL HIP ARTHROPLASTY Left 07/17/2015   Procedure: LEFT TOTAL HIP ARTHROPLASTY ANTERIOR APPROACH;  Surgeon: Paralee Cancel, MD;  Location: [facility];  Service: Orthopedics;  Laterality: Left;  . TOTAL HIP ARTHROPLASTY Right 08/21/2015   Procedure: RIGHT TOTAL HIP ARTHROPLASTY ANTERIOR APPROACH;  Surgeon: Paralee Cancel, MD;  Location: [facility];  Service: Orthopedics;  Laterality: Right;  . TRANSTHORACIC ECHOCARDIOGRAM  12/30/2015   ef 79-15%, grade 1 diastolic dysfunction/  mild MR/ trivial TR    Her Family History Is Significant For: Family History  Problem Relation Age of Onset  . Heart disease Mother   . Heart attack Mother   . Asthma Mother   . Breast cancer Mother 76  . Other Father        car accident  . Breast cancer Sister 81  . Lung cancer Sister   . Other Sister        ATM mutation  . Heart attack Brother 33  . Coronary artery disease Brother   . Breast cancer Maternal Grandmother   . Rheum arthritis Maternal Grandmother   . Stroke Maternal Grandfather   . Brain cancer Paternal Grandfather   . Lung cancer Paternal Grandfather   . Breast cancer Maternal Aunt 34  . Alzheimer's disease Paternal Grandmother   . Testicular cancer Cousin 28  . Other Other        ATM mutation    Her Social History Is Significant For: Social History   Socioeconomic History  . Marital status: Divorced    Spouse name: Not on file  . Number of children: 0  . Years of education: 64  . Highest education level: Not on file   Occupational History  . Occupation: retired    Fish farm manager: UNEMPLOYED  Tobacco Use  . Smoking status: Former Smoker    Years: 11.00    Types: Cigarettes    Quit date: 09/29/1984    Years since quitting: 35.8  . Smokeless tobacco: Never Used  Vaping Use  . Vaping Use: Never used  Substance and Sexual Activity  . Alcohol use: Yes    Comment: wine occassionally  . Drug use: No    Comment: hx occasional marjuana (as documented 2017)  . Sexual activity: Not on file  Other Topics Concern  . Not on file  Social History Narrative   Lives alone   Caffeine use: Drinks coffee sometimes  Right handed    Social Determinants of Health   Financial Resource Strain:   . Difficulty of Paying Living Expenses: Not on file  Food Insecurity:   . Worried About Charity fundraiser in the Last Year: Not on file  . Ran Out of Food in the Last Year: Not on file  Transportation Needs:   . Lack of Transportation (Medical): Not on file  . Lack of Transportation (Non-Medical): Not on file  Physical Activity:   . Days of Exercise per Week: Not on file  . Minutes of Exercise per Session: Not on file  Stress:   . Feeling of Stress : Not on file  Social Connections:   . Frequency of Communication with Friends and Family: Not on file  . Frequency of Social Gatherings with Friends and Family: Not on file  . Attends Religious Services: Not on file  . Active Member of Clubs or Organizations: Not on file  . Attends Archivist Meetings: Not on file  . Marital Status: Not on file    Her Allergies Are:  Allergies  Allergen Reactions  . Morphine And Related Other (See Comments)    Headaches - pt can take hydromorphone  . Codeine Other (See Comments)    REACTION: Insomnia "crazy dreams"  . Fentanyl Nausea And Vomiting    Fentanyl patch - nausea and vomiting.  . Gluten Meal Other (See Comments)    Pt avoids eating gluten  . Hydrocodone Other (See Comments)    Urinary retention---  "can only  take 1/2 tablet low dose"  . Lactose Intolerance (Gi) Other (See Comments)    Upset stomach  . Lipitor [Atorvastatin] Other (See Comments)    Pain, myalgias  . Monosodium Glutamate Other (See Comments)    MSG  --- Increases blood pressure  . Tetracycline Nausea And Vomiting  . Tramadol Other (See Comments)    "Passed out"  :   Her Current Medications Are:  Outpatient Encounter Medications as of 08/08/2020  Medication Sig  . acetaminophen (TYLENOL) 650 MG CR tablet Take 1,300 mg every 8 (eight) hours as needed by mouth for pain.  Marland Kitchen amitriptyline (ELAVIL) 50 MG tablet Take 50 mg by mouth at bedtime.  . ARIPiprazole (ABILIFY) 5 MG tablet Take 5 mg by mouth daily.  Marland Kitchen aspirin 325 MG tablet Take 1 tablet (325 mg total) by mouth daily.  . celecoxib (CELEBREX) 200 MG capsule Take 200 mg 2 (two) times daily as needed by mouth.  . cholecalciferol (VITAMIN D) 1000 UNITS tablet Take 1,000 Units daily by mouth.   . clonazePAM (KLONOPIN) 1 MG tablet Take 1 tablet (1 mg total) by mouth 4 (four) times daily. (Patient taking differently: Take 1 mg by mouth 4 (four) times daily as needed for anxiety. )  . cyanocobalamin (,VITAMIN B-12,) 1000 MCG/ML injection Inject 1,000 mcg See admin instructions into the muscle. Vitamin B12 - every month --  last injection approx 07-24-2017  . Cyanocobalamin (VITAMIN B-12) 5000 MCG TBDP Take 5,000 mcg daily with lunch by mouth. Per pt varies 2063mg, 25050m, or 500023m . donepezil (ARICEPT) 10 MG tablet Take 1 tablet (10 mg total) by mouth at bedtime.  . DULoxetine (CYMBALTA) 60 MG capsule Take 60 mg by mouth at bedtime.   . eMarland Kitchenetimibe (ZETIA) 10 MG tablet Take 1 tablet (10 mg total) by mouth daily. (Patient taking differently: Take 10 mg every evening by mouth. )  . fluticasone (FLONASE) 50 MCG/ACT nasal spray Place 1 spray as  needed into both nostrils.   . furosemide (LASIX) 20 MG tablet Take 20 mg by mouth every Monday, Wednesday, and Friday.  . gabapentin  (NEURONTIN) 300 MG capsule Take 300 mg at bedtime by mouth. PT ONLY TAKES AT BEDTIME--  STATED PRESCRIBED TID  . hydrocortisone cream 1 % Apply 1 application topically at bedtime as needed for itching.   Marland Kitchen HYDROmorphone (DILAUDID) 2 MG tablet Take 2 mg by mouth every 4 (four) hours as needed for severe pain.  Marland Kitchen lubiprostone (AMITIZA) 24 MCG capsule Take 1 capsule (24 mcg total) by mouth daily with breakfast. (Patient taking differently: Take 24 mcg by mouth at bedtime. )  . methocarbamol (ROBAXIN) 500 MG tablet Take 500 mg by mouth 4 (four) times daily.  . montelukast (SINGULAIR) 10 MG tablet Take 10 mg by mouth at bedtime.   . Multiple Vitamin (MULTIVITAMIN IRON-FREE) TABS Take daily by mouth.  . nitrofurantoin, macrocrystal-monohydrate, (MACROBID) 100 MG capsule Take 100 mg at bedtime by mouth. Continuous course  . NUVIGIL 150 MG tablet TAKE 1 TABLET BY MOUTH EVERY DAY (Patient taking differently: TAKE 1/2 TO 1 TABLET BY MOUTH 2 TIMES DAILY AS NEEDED FOR ENERGY BOOST)  . omeprazole (PRILOSEC) 40 MG capsule Take 40 mg at bedtime by mouth.   . pravastatin (PRAVACHOL) 40 MG tablet Take 40 mg every evening by mouth. Per pt has not taken in few months (on 08-10-2017)  . rifaximin (XIFAXAN) 550 MG TABS tablet Take 550 mg by mouth.  . TESTOSTERONE IM Inject 1 application See admin instructions into the muscle. Every 2 1/2 months at Dr. Stann Mainland at [place] office - last injection approx.  End of October 2018  . tizanidine (ZANAFLEX) 2 MG capsule Take 2 mg by mouth 3 (three) times daily.  . valsartan (DIOVAN) 80 MG tablet Take 80 mg by mouth daily.  Marland Kitchen zolpidem (AMBIEN) 10 MG tablet Take 10 mg by mouth at bedtime.   Marland Kitchen telmisartan (MICARDIS) 20 MG tablet Take by mouth.   No facility-administered encounter medications on file as of 08/08/2020.  :  Review of Systems:  Out of a complete 14 point review of systems, all are reviewed and negative with the exception of these symptoms as listed  below: Review of Systems  Neurological:       Pt presents today to discuss her sleep. Pt reports that she has had two non-successful sleep studies in the past that showed borderline osa. She has not been treated with a cpap. Pt does endorse snoring.  Epworth Sleepiness Scale 0= would never doze 1= slight chance of dozing 2= moderate chance of dozing 3= high chance of dozing  Sitting and reading: 3 Watching TV: 2 Sitting inactive in a public place (ex. Theater or meeting): 2 As a passenger in a car for an hour without a break: 3 Lying down to rest in the afternoon: 3 Sitting and talking to someone: 0 Sitting quietly after lunch (no alcohol): 3 In a car, while stopped in traffic: 0 Total: 16     Objective:  Neurological Exam  Physical Exam Physical Examination:   Vitals:   08/08/20 1328  BP: 111/75  Pulse: 78    General Examination: The patient is a very pleasant 64 y.o. female in no acute distress. She appears well-developed and well-nourished and well groomed.   HEENT: Normocephalic, atraumatic, pupils are equal, round and reactive to light, extraocular tracking is good without limitation to gaze excursion or nystagmus noted. Hearing is grossly intact. Face  is symmetric with normal facial animation. Speech is clear with no dysarthria noted. There is no hypophonia. There is no lip, neck/head, jaw or voice tremor. Neck is supple with full range of passive and active motion. There are no carotid bruits on auscultation. Oropharynx exam reveals: moderate mouth dryness, adequate dental hygiene and moderate airway crowding, due to small airway entry, neck circumference is 14, no significant tonsillar enlargement, minimal.  Tongue protrudes centrally and palate elevates symmetrically.  Chest: Clear to auscultation without wheezing, rhonchi or crackles noted.  Heart: S1+S2+0, regular and normal without murmurs, rubs or gallops noted.   Abdomen: Soft, non-tender and non-distended  with normal bowel sounds appreciated on auscultation.  Extremities: There is nonpitting puffiness noted in both distal lower extremities.   Skin: Warm and dry without trophic changes noted.   Musculoskeletal: exam reveals no obvious joint deformities, tenderness or joint swelling or erythema.   Neurologically:  Mental status: The patient is awake, alert and oriented in all 4 spheres. Her immediate and remote memory, attention, language skills and fund of knowledge are appropriate. There is no evidence of aphasia, agnosia, apraxia or anomia. Speech is clear with normal prosody and enunciation. Thought process is linear. Mood is normal and affect is normal.  Cranial nerves II - XII are as described above under HEENT exam.  Motor exam: Normal bulk, strength and tone is noted. There is no tremor, fine motor skills and coordination: grossly intact.  Cerebellar testing: No dysmetria or intention tremor. There is no truncal or gait ataxia.  Sensory exam: intact to light touch in the upper and lower extremities.  Gait, station and balance: She stands without difficulty, she has no walking aid.  She walks without problems, no limp noted.  Assessment and Plan:    In summary, Kristin Pope is a very pleasant 64 y.o.-year old female with an underlying medical history of reflux disease, diverticulitis, arthritis, degenerative lumbar and cervical spine disease, status post bilateral knee replacement surgeries, migraine headaches, memory loss, fibromyalgia, chronic pain, on narcotic pain medication, prediabetes, and obesity, who presents for evaluation of her sleep disturbance.  She has daytime somnolence, she was diagnosed with mild obstructive sleep apnea but has not been on CPAP therapy.  She would be willing to get reevaluated.  I do believe her daytime somnolence is in part related to medication effect.  She reports no recent change in her medication.  Nevertheless, she does take Nuvigil to help her stay  awake during the day.  We talked about treatment for sleep apnea.  She would be willing to consider CPAP therapy.  We talked about the importance of good sleep hygiene as well.   She does not have a set schedule for her sleep time and rise time. I explained the sleep test procedure to the patient and also outlined possible surgical and non-surgical treatment options of OSA, including the use of a custom-made dental device (which would require a referral to a specialist dentist or oral surgeon). We will pick up our discussion about treatment options for sleep apnea after testing. I answered all her questions today and the patient was in agreement. I plan to see her back after the sleep study is completed and encouraged her to call with any interim questions, concerns, problems or updates.   Thank you very much for allowing me to participate in the care of this nice patient. If I can be of any further assistance to you please do not hesitate to talk to me.  Sincerely,   Star Age, MD, PhD

## 2020-08-09 ENCOUNTER — Telehealth: Payer: Self-pay

## 2020-08-09 NOTE — Telephone Encounter (Signed)
LVM to call back to schedule sleep study.

## 2020-08-14 ENCOUNTER — Telehealth: Payer: Self-pay

## 2020-08-14 NOTE — Telephone Encounter (Signed)
Called pt to schedule NPSG. Pt is not interested in scheduling at this time. 1: because she is currently sick and 2: because she wants to check with her insurance to see if it's better to schedule now or wait until the new year. Pt was given my direct number to call me back for when she is ready to schedule.

## 2021-02-11 DIAGNOSIS — M47816 Spondylosis without myelopathy or radiculopathy, lumbar region: Secondary | ICD-10-CM | POA: Insufficient documentation

## 2021-05-06 ENCOUNTER — Encounter: Payer: Self-pay | Admitting: Cardiology

## 2021-05-06 ENCOUNTER — Encounter: Payer: Self-pay | Admitting: *Deleted

## 2021-06-18 ENCOUNTER — Other Ambulatory Visit: Payer: Self-pay

## 2021-06-18 ENCOUNTER — Ambulatory Visit (INDEPENDENT_AMBULATORY_CARE_PROVIDER_SITE_OTHER): Payer: Medicare Other

## 2021-06-18 ENCOUNTER — Ambulatory Visit: Payer: Medicare Other | Admitting: Cardiology

## 2021-06-18 ENCOUNTER — Encounter: Payer: Self-pay | Admitting: Cardiology

## 2021-06-18 VITALS — BP 140/90 | HR 99 | Ht <= 58 in | Wt 152.0 lb

## 2021-06-18 DIAGNOSIS — R0602 Shortness of breath: Secondary | ICD-10-CM

## 2021-06-18 DIAGNOSIS — R002 Palpitations: Secondary | ICD-10-CM

## 2021-06-18 DIAGNOSIS — I10 Essential (primary) hypertension: Secondary | ICD-10-CM | POA: Diagnosis not present

## 2021-06-18 DIAGNOSIS — R079 Chest pain, unspecified: Secondary | ICD-10-CM

## 2021-06-18 DIAGNOSIS — E669 Obesity, unspecified: Secondary | ICD-10-CM

## 2021-06-18 DIAGNOSIS — R7303 Prediabetes: Secondary | ICD-10-CM

## 2021-06-18 MED ORDER — FUROSEMIDE 20 MG PO TABS: 20.0000 mg | ORAL_TABLET | ORAL | 3 refills | Status: DC

## 2021-06-18 MED ORDER — NITROGLYCERIN 0.4 MG SL SUBL
0.4000 mg | SUBLINGUAL_TABLET | SUBLINGUAL | 3 refills | Status: AC | PRN
Start: 2021-06-18 — End: 2022-01-14

## 2021-06-18 MED ORDER — METOPROLOL TARTRATE 100 MG PO TABS: 100.0000 mg | ORAL_TABLET | Freq: Once | ORAL | 0 refills | Status: DC

## 2021-06-18 NOTE — Progress Notes (Signed)
Cardiology Office Note:    Date:  06/18/2021   ID:  Kristin Pope, DOB 11-28-1955, MRN [id number]  PCP:  Bailey Mech, PA-C  Cardiologist:  Thomasene Ripple, DO  Electrophysiologist:  None   Referring MD: Bernerd Pho*   " I really tired recently have had some shortness of breath, I am short of breath and I am having palpitations"  History of Present Illness:    Kristin Pope is a 65 y.o. female with a hx of hypertension, hyperlipidemia, prediabetes, reports history of mitral valve prolapse in the past however echocardiogram done in 2017 does not show any evidence of mitral valve prolapse, grade 1 diastolic dysfunction was referred by her primary care provider for chest discomfort, shortness of breath and palpitations. The patient tells me this all started on July 4.  She started experience intermittent chest discomfort.  She described as a midsternal chest pain which sometimes radiates to the left side.  It is intermittent.  It comes off and on.  She does have associated shortness of breath.  She also tells me that recently the symptoms have becomes frequent.  She notes the palpitations is associated with this as well.  She also has abrupt onset of fast heartbeat which last for minutes at a time prior to resolution.  She is concerned because she is unable to do things at home giving this issue recently.  I reviewed her PCP notes and lab work which was done on May 04, 2019 shows TSH 0.82, free T45.3, T3 41.6, transferring 317, iron 78, ferritin 22, TIBC 444, ferritin 22, folate 15.9, B12 909, vitamin D 42, magnesium 2.2,. Lipid profile: LDL 41, total cholesterol 563, triglyceride 223, HDL 71. CBC: WBC 5.5, hemoglobin 11.2, hematocrit 33.7, platelets 459. CMP sodium 140, potassium 4.4, chloride 103, bicarb 29, BUN 15, glucose 88, creatinine 0.75, calcium 9.9, total protein 7.2, albumin 4.7, total bili 0.3, alk phos 69, AST 22, ALT 9, globin A1c 5   Past Medical History:   Diagnosis Date   Abnormal finding on MRI of brain 02/07/2009   Qualifier: Diagnosis of  By: Mayford Knife, LPN, Domenic Polite    ALLERGIC RHINITIS 03/29/2007   Qualifier: Diagnosis of  By: Briscoe Burns CMA, Lakisha     ANEMIA, B12 DEFICIENCY 05/18/2007   Qualifier: Diagnosis of  By: Mayford Knife, LPN, Bonnye M    Anemia, pernicious    b12 def.   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 03/2018: Chronic; noted MRI 11/2015   Cerebrovascular disease or lesion 12/30/2015   Cervical radiculopathy 06/16/2017   Cervical spondylosis with radiculopathy    C2 -- C7   Chronic fatigue    Chronic pain    neck, back   Common migraine with intractable migraine 09/01/2018   CONSTIPATION, SLOW TRANSIT 10/04/2010   Qualifier: Diagnosis of  By: Lovell Sheehan MD, Balinda Quails    CYSTITIS, CHRONIC INTERSTITIAL 06/27/2009   Qualifier: Diagnosis of  By: Lovell Sheehan MD, Balinda Quails    DDD (degenerative disc disease), lumbosacral    DEGENERATIVE DISC DISEASE, CERVICAL SPINE 05/18/2007   Qualifier: Diagnosis of  By: Lovell Sheehan MD, John E    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 Followed by gynecology   Eczema    Essential hypertension 09/14/2007   Qualifier: Diagnosis of  By: [place], Debby     Family history of adverse reaction to anesthesia    sister has problems waking up   Family history of breast cancer    FIBROMYALGIA 03/29/2007   Qualifier: Diagnosis of  By: Tiney Rouge CMA, Ellison Hughs     Frontal headache 04/16/2018   GERD (gastroesophageal reflux disease)    Headache    constant headaches   HIP PAIN, LEFT,  CHRONIC 07/04/2008   Qualifier: Diagnosis of  By: Arnoldo Morale MD, John E    History of colonic diverticulitis 08/16/2010   w/ perforation (lower GI bleed)--- resolved without surgerical intervention   History of TIA (transient ischemic attack) 12/28/2015   per MRI - chronic left cerebellar infarct--- no residual   Hx of pyelonephritis 09/2005   due to UTI   Hypercalcemia 11/18/2007   Qualifier: Diagnosis of  By: Arnoldo Morale MD, John E    Hyperlipidemia LDL goal <70 12/30/2015   Hypertension    Hyponatremia 04/16/2018   Insomnia 03/03/2011   Irritable bowel syndrome 08/14/2009   Qualifier: Diagnosis of  By: Arnoldo Morale MD, John E    Lipoma of other specified sites 04/13/2009   Annotation: over right ankle Qualifier: Diagnosis of  By: Arnoldo Morale MD, Balinda Quails    LOW BACK PAIN 05/26/2008   Qualifier: Diagnosis of  By: Arnoldo Morale MD, John E    Lumbosacral spondylosis    L2-3, L4-5   MALAISE AND FATIGUE 07/04/2008   Qualifier: Diagnosis of  By: Arnoldo Morale MD, Balinda Quails    Memory difficulty 08/11/2019   Mild obstructive sleep apnea    per study 06/ 2009 mild osa  AHI 13/hr---  recommendation given mouth appliance, loss wt., cpap   Mild pulmonary hypertension ([place]) 12/30/2015   Mixed hyperlipidemia    Muscle weakness (generalized) 12/20/2008   Qualifier: Diagnosis of  By: Arnoldo Morale MD, Balinda Quails    OA (osteoarthritis)    right shoulder AC joint   Obese 07/19/2015   OSA (obstructive sleep apnea) 03/03/2011   NPSG 2009:  AHI 13/hr.  No treatment   Osteopenia of multiple sites 01/29/2017   Polypharmacy 05/03/2018   Postmenopausal bleeding 09/14/2017   Formatting of this note might be different from the original. Followed by gynecology   Pre-diabetes    Proteinuria 12/23/2007   Qualifier: Diagnosis of  By: Arnoldo Morale MD, John E    Recurrent UTI 12/22/2017   Right rotator cuff tear    S/P left THA, AA 07/17/2015   SYNCOPE 03/13/2009   Qualifier: Diagnosis of  By: Arnoldo Morale MD, Balinda Quails    Thrombocytosis 05/22/2017    Formatting of this note might be different from the original. Overview:  New; mild. Trend at regular f/u.  08/2018: Slight to 419k Formatting of this note might be different from the original. New; mild. Trend at regular f/u.   TIA (transient ischemic attack) 12/28/2015   Unspecified hypothyroidism 05/16/2009   Qualifier: Diagnosis of  By: Arnoldo Morale MD, John E    Vitamin B12 deficiency 06/03/2018   Vitamin D deficiency 06/03/2018    Past Surgical History:  Procedure Laterality Date   BREAST SURGERY     breast biopsy-benign   COLONOSCOPY  last one 08-08-2009   DILATATION & CURRETTAGE/HYSTEROSCOPY WITH RESECTOCOPE  02-03-2011   dr Dellis Filbert  [place]   polypectomy   RADIAL KERATOTOMY     RADIAL OPTIC NEUROTOMY     twice in lumbar area of back-every 6 months   SHOULDER ARTHROSCOPY WITH ROTATOR CUFF REPAIR Right 08/13/2017   Procedure: RIGHT SHOULDER ARTHROSCOPY, DEBRIDEMENT, BICEPS TENOTOMY, ROTATOR CUFF REPAIR, DISTAL CLAVICLE RESECTION;  Surgeon: Sydnee Cabal, MD;  Location: [facility];  Service: Orthopedics;  Laterality: Right;   TOTAL HIP ARTHROPLASTY Left 07/17/2015   Procedure: LEFT TOTAL HIP ARTHROPLASTY ANTERIOR APPROACH;  Surgeon: Paralee Cancel, MD;  Location: [facility];  Service: Orthopedics;  Laterality: Left;   TOTAL HIP ARTHROPLASTY Right 08/21/2015   Procedure: RIGHT TOTAL HIP ARTHROPLASTY ANTERIOR APPROACH;  Surgeon: Paralee Cancel, MD;  Location: [facility];  Service: Orthopedics;  Laterality: Right;   TRANSTHORACIC ECHOCARDIOGRAM  12/30/2015   ef 65-03%, grade 1 diastolic dysfunction/  mild MR/ trivial TR    Current Medications: Current Meds  Medication Sig   acetaminophen (TYLENOL) 650 MG CR tablet Take 1,300 mg every 8 (eight) hours as needed by mouth for pain.   amitriptyline (ELAVIL) 50 MG tablet Take 50 mg by mouth at bedtime.   Armodafinil 150 MG tablet Take 150 mg by mouth 2 (two) times daily as needed (energy boost).   aspirin 81 MG EC tablet Take 1 tablet by  mouth daily.   celecoxib (CELEBREX) 200 MG capsule Take 200 mg by mouth 2 (two) times daily as needed for mild pain.   cholecalciferol (VITAMIN D) 1000 UNITS tablet Take 1,000 Units daily by mouth.    clonazePAM (KLONOPIN) 1 MG tablet Take 1 mg by mouth 4 (four) times daily as needed for anxiety.   cyanocobalamin 100 MCG tablet Take 100 mcg by mouth daily.   donepezil (ARICEPT) 10 MG tablet Take 1 tablet (10 mg total) by mouth at bedtime.   DULoxetine (CYMBALTA) 60 MG capsule Take 60 mg by mouth at bedtime.    escitalopram (LEXAPRO) 10 MG tablet Take 10 mg by mouth every evening.   ezetimibe (ZETIA) 10 MG tablet Take 1 tablet (10 mg total) by mouth daily.   gabapentin (NEURONTIN) 300 MG capsule Take 300 mg by mouth 3 (three) times daily.   HYDROmorphone (DILAUDID) 2 MG tablet Take 2 mg by mouth 3 (three) times daily as needed for severe pain.   lubiprostone (AMITIZA) 24 MCG capsule Take 24 mcg by mouth at bedtime.   methocarbamol (ROBAXIN) 500 MG tablet Take 500 mg by mouth 4 (four) times daily as needed for muscle spasms.   methocarbamol (ROBAXIN) 750 MG tablet Take 500 mg by mouth 3 (three) times daily as needed (pain).   metoprolol tartrate (LOPRESSOR) 100 MG tablet Take 1 tablet (100 mg total) by mouth once for 1 dose. Take one tablet two hours prior to your cardiac CT   montelukast (SINGULAIR) 10 MG tablet Take 10 mg by mouth at bedtime.    nitrofurantoin, macrocrystal-monohydrate, (MACROBID) 100 MG capsule Take 100 mg at bedtime by mouth. Continuous course   nitroGLYCERIN (NITROSTAT) 0.4 MG SL tablet Place 1 tablet (0.4 mg total) under the tongue every 5 (five) minutes as needed.   omeprazole (PRILOSEC) 40 MG capsule Take 40 mg at bedtime by mouth.    telmisartan (MICARDIS) 40 MG tablet Take 20 mg by mouth daily.   tizanidine (ZANAFLEX) 2 MG capsule Take 2 mg by mouth 4 (four) times daily as needed for muscle spasms.   triamcinolone cream (KENALOG) 0.1 % Apply 1 application topically 2  (two) times daily.  zolpidem (AMBIEN CR) 12.5 MG CR tablet Take 12.5 mg by mouth at bedtime as needed for sleep.   [DISCONTINUED] furosemide (LASIX) 20 MG tablet Take 20 mg by mouth every Monday, Wednesday, and Friday.     Allergies:   Morphine and related, Aminoglutethimide, Buprenorphine hcl, Fentanyl, Gluten meal, Griseofulvin ultramicrosize [griseofulvin], Hydrocodone, Lactose intolerance (gi), Lipitor [atorvastatin], Monosodium glutamate, Statins, Tetracycline, Tramadol, and Codeine   Social History   Socioeconomic History   Marital status: Divorced    Spouse name: Not on file   Number of children: 0   Years of education: 18   Highest education level: Not on file  Occupational History   Occupation: retired    Fish farm manager: UNEMPLOYED  Tobacco Use   Smoking status: Former    Years: 11.00    Types: Cigarettes    Quit date: 09/29/1984    Years since quitting: 36.7   Smokeless tobacco: Never  Vaping Use   Vaping Use: Never used  Substance and Sexual Activity   Alcohol use: Yes    Comment: wine occassionally   Drug use: No    Comment: hx occasional marjuana (as documented 2017)   Sexual activity: Not on file  Other Topics Concern   Not on file  Social History Narrative   Lives alone   Caffeine use: Drinks coffee sometimes   Right handed    Social Determinants of Health   Financial Resource Strain: Not on file  Food Insecurity: Not on file  Transportation Needs: Not on file  Physical Activity: Not on file  Stress: Not on file  Social Connections: Not on file     Family History: The patient's family history includes Alzheimer's disease in her paternal grandmother; Asthma in her mother; Brain cancer in her paternal grandfather; Breast cancer in her maternal grandmother; Breast cancer (age of onset: 72) in her maternal aunt; Breast cancer (age of onset: 74) in her sister; Breast cancer (age of onset: 48) in her mother; Coronary artery disease in her brother; Heart attack in  her mother; Heart attack (age of onset: 98) in her brother; Heart disease in her mother; Lung cancer in her paternal grandfather and sister; Other in her father, sister, and another family member; Rheum arthritis in her maternal grandmother; Stroke in her maternal grandfather; Testicular cancer (age of onset: 74) in her cousin.  ROS:   Review of Systems  Constitution: Negative for decreased appetite, fever and weight gain.  HENT: Negative for congestion, ear discharge, hoarse voice and sore throat.   Eyes: Negative for discharge, redness, vision loss in right eye and visual halos.  Cardiovascular: Reports chest pain, dyspnea on exertion, leg swelling, and palpitations.  Negative for orthopnea Respiratory: Negative for cough, hemoptysis, shortness of breath and snoring.   Endocrine: Negative for heat intolerance and polyphagia.  Hematologic/Lymphatic: Negative for bleeding problem. Does not bruise/bleed easily.  Skin: Negative for flushing, nail changes, rash and suspicious lesions.  Musculoskeletal: Negative for arthritis, joint pain, muscle cramps, myalgias, neck pain and stiffness.  Gastrointestinal: Negative for abdominal pain, bowel incontinence, diarrhea and excessive appetite.  Genitourinary: Negative for decreased libido, genital sores and incomplete emptying.  Neurological: Negative for brief paralysis, focal weakness, headaches and loss of balance.  Psychiatric/Behavioral: Negative for altered mental status, depression and suicidal ideas.  Allergic/Immunologic: Negative for HIV exposure and persistent infections.    EKGs/Labs/Other Studies Reviewed:    The following studies were reviewed today:   EKG:  The ekg ordered today demonstrates sinus rhythm, heart rate 90 bpm  with P wave morphology suggesting left atrial lodgment.  Transthoracic echocardiogram December 30, 2015 Study Conclusions   - Left ventricle: The cavity size was normal. There was mild    concentric hypertrophy.  Systolic function was normal. The    estimated ejection fraction was in the range of 60% to 65%. Wall    motion was normal; there were no regional wall motion    abnormalities. There was an increased relative contribution of    atrial contraction to ventricular filling. Doppler parameters are    consistent with abnormal left ventricular relaxation (grade 1    diastolic dysfunction).  - Mitral valve: There was mild regurgitation.  - Pulmonary arteries: PA peak pressure: 37 mm Hg (S).   Impressions:   - The right ventricular systolic pressure was increased consistent    with mild pulmonary hypertension.   Transthoracic echocardiography.  M-mode, complete 2D, spectral  Doppler, and color Doppler.  Birthdate:  Patient birthdate:  January 02, 1956.  Age:  Patient is 65 yr old.  Sex:  Gender: female.  BMI: 34.4 kg/m^2.  Blood pressure:     121/68  Patient status:  Inpatient.  Study date:  Study date: 12/30/2015. Study time: 08:32  AM.  Location:  [facility].   -------------------------------------------------------------------   -------------------------------------------------------------------  Left ventricle:  The cavity size was normal. There was mild  concentric hypertrophy. Systolic function was normal. The estimated  ejection fraction was in the range of 60% to 65%. Wall motion was  normal; there were no regional wall motion abnormalities. There was  an increased relative contribution of atrial contraction to  ventricular filling. Doppler parameters are consistent with  abnormal left ventricular relaxation (grade 1 diastolic  dysfunction).   -------------------------------------------------------------------  Aortic valve:   Trileaflet; normal thickness leaflets. Mobility was  not restricted.  Doppler:  Transvalvular velocity was within the  normal range. There was no stenosis. There was no regurgitation.     -------------------------------------------------------------------   Aorta:  Aortic root: The aortic root was normal in size.   -------------------------------------------------------------------  Mitral valve:   Structurally normal valve.   Mobility was not  restricted.  Doppler:  Transvalvular velocity was within the normal  range. There was no evidence for stenosis. There was mild  regurgitation.   -------------------------------------------------------------------  Left atrium:  The atrium was normal in size.   -------------------------------------------------------------------  Right ventricle:  The cavity size was normal. Wall thickness was  normal. Systolic function was normal.   -------------------------------------------------------------------  Pulmonic valve:    Structurally normal valve.   Cusp separation was  normal.  Doppler:  Transvalvular velocity was within the normal  range. There was no evidence for stenosis. There was no  regurgitation.   -------------------------------------------------------------------  Tricuspid valve:   Structurally normal valve.    Doppler:  Transvalvular velocity was within the normal range. There was  trivial regurgitation.   -------------------------------------------------------------------  Pulmonary artery:   The main pulmonary artery was normal-sized.  Systolic pressure was within the normal range.   -------------------------------------------------------------------  Right atrium:  The atrium was normal in size.   -------------------------------------------------------------------  Pericardium:  There was no pericardial effusion.   -------------------------------------------------------------------  Systemic veins:  Inferior vena cava: The vessel was normal in size.  Recent Labs: No results found for requested labs within last 8760 hours.  Recent Lipid Panel    Component Value Date/Time   CHOL 286 (H) 12/29/2015 1214   TRIG 177 (H) 12/29/2015 1214   HDL 57 12/29/2015 1214   CHOLHDL 5.0  12/29/2015  1214   VLDL 35 12/29/2015 1214   LDLCALC 194 (H) 12/29/2015 1214   LDLDIRECT 111.0 11/12/2011 1610    Physical Exam:    VS:  BP 140/90 (BP Location: Left Arm, Patient Position: Sitting, Cuff Size: Normal)   Pulse 99   Ht _0  (1.473 m)   Wt 152 lb (68.9 kg)   SpO2 97%   BMI 31.77 kg/m     Wt Readings from Last 3 Encounters:  06/18/21 152 lb (68.9 kg)  04/26/21 156 lb 11.2 oz (71.1 kg)  08/08/20 164 lb (74.4 kg)     GEN: Well nourished, well developed in no acute distress HEENT: Normal NECK: No JVD; No carotid bruits LYMPHATICS: No lymphadenopathy CARDIAC: S1S2 noted,RRR, no murmurs, rubs, gallops RESPIRATORY:  Clear to auscultation without rales, wheezing or rhonchi  ABDOMEN: Soft, non-tender, non-distended, +bowel sounds, no guarding. EXTREMITIES: No edema, No cyanosis, no clubbing MUSCULOSKELETAL:  No deformity  SKIN: Warm and dry NEUROLOGIC:  Alert and oriented x 3, non-focal PSYCHIATRIC:  Normal affect, good insight  ASSESSMENT:    1. Essential hypertension   2. Palpitations   3. Shortness of breath   4. Chest pain of uncertain etiology   5. Prediabetes   6. Obesity (BMI 30-39.9)    PLAN:    The symptoms chest pain is concerning, this patient does have intermediate risk for coronary artery disease and at this time I would like to pursue an ischemic evaluation in this patient.  Shared decision a coronary CTA at this time is appropriate.  I have discussed with the patient about the testing.  The patient has no IV contrast allergy and is agreeable to proceed with this test.  Sublingual nitroglycerin prescription was sent, its protocol and 911 protocol explained and the patient vocalized understanding questions were answered to the patient's satisfaction  For dyspnea on exertion-echocardiogram will also be done to assess LV/RV function and any other structure abnormalities.  She also should be on Lasix 3 times a week and has not been taking this  medication have asked the patient to please restart the Lasix to see if she has any improvement.  I would like to rule out a cardiovascular etiology of this palpitation, therefore at this time I would like to placed a zio patch for  3  days. I  Once these testing have been performed amd reviewed further reccomendations will be made. For now, I do reccomend that the patient goes to the nearest ED if  symptoms recur.  Her blood pressure is slightly elevated in the office today.  Hoping when she get back taking her Lasix as prescribed this will help bring her down to less than 130/80 mmHg.  Hyperlipidemia - continue with current statin medication.  The patient is in agreement with the above plan. The patient left the office in stable condition.  The patient will follow up in 3 months.  She intends to see me in [place].   Medication Adjustments/Labs and Tests Ordered: Current medicines are reviewed at length with the patient today.  Concerns regarding medicines are outlined above.  Orders Placed This Encounter  Procedures   CT CORONARY MORPH W/CTA COR W/SCORE W/CA W/CM &/OR WO/CM   LONG TERM MONITOR (3-14 DAYS)   EKG 12-Lead   ECHOCARDIOGRAM COMPLETE    Meds ordered this encounter  Medications   nitroGLYCERIN (NITROSTAT) 0.4 MG SL tablet    Sig: Place 1 tablet (0.4 mg total) under the tongue every 5 (five) minutes as needed.  Dispense:  30 tablet    Refill:  3   furosemide (LASIX) 20 MG tablet    Sig: Take 1 tablet (20 mg total) by mouth every Monday, Wednesday, and Friday.    Dispense:  30 tablet    Refill:  3   metoprolol tartrate (LOPRESSOR) 100 MG tablet    Sig: Take 1 tablet (100 mg total) by mouth once for 1 dose. Take one tablet two hours prior to your cardiac CT    Dispense:  1 tablet    Refill:  0     Patient Instructions  Medication Instructions:  Your physician has recommended you make the following change in your medication:  START: Nitroglycerin 0.4 mg take one  tablet by mouth every 5 minutes up to three times as needed for chest pain.  RESTART: Lasix 20 mg on Monday, Wednesday, and Friday.  *If you need a refill on your cardiac medications before your next appointment, please call your pharmacy*   Lab Work: None If you have labs (blood work) drawn today and your tests are completely normal, you will receive your results only by: [place] (if you have MyChart) OR A paper copy in the mail If you have any lab test that is abnormal or we need to change your treatment, we will call you to review the results.   Testing/Procedures: Your physician has requested that you have an echocardiogram. Echocardiography is a painless test that uses sound waves to create images of your heart. It provides your doctor with information about the size and shape of your heart and how well your heart's chambers and valves are working. This procedure takes approximately one hour. There are no restrictions for this procedure.  A zio monitor was ordered today. It will remain on for 3 days. You will then return monitor and event diary in provided box. It takes 1-2 weeks for report to be downloaded and returned to [place]. We will call you with the results. If monitor falls off or has orange flashing light, please call [facility] for further instructions.     Your cardiac CT will be scheduled at the below location:   [facility] [address] [place],  [address] [phone]  If scheduled at [facility], please arrive at the [facility] main entrance (entrance A) of [facility] 30 minutes prior to test start time. Proceed to the [facility] Radiology Department (first floor) to check-in and test prep.  Please follow these instructions carefully (unless otherwise directed):  On the Night Before the Test: Be sure to Drink plenty of water. Do not consume any caffeinated/decaffeinated beverages or chocolate 12 hours prior to your test. Do  not take any antihistamines 12 hours prior to your test.  On the Day of the Test: Drink plenty of water until 1 hour prior to the test. Do not eat any food 4 hours prior to the test. You may take your regular medications prior to the test.  Take metoprolol (Lopressor) two hours prior to test. HOLD Furosemide morning of the test. FEMALES- please wear underwire-free bra if available, avoid dresses & tight clothing      After the Test: Drink plenty of water. After receiving IV contrast, you may experience a mild flushed feeling. This is normal. On occasion, you may experience a mild rash up to 24 hours after the test. This is not dangerous. If this occurs, you can take Benadryl 25 mg and increase your fluid intake. If you  experience trouble breathing, this can be serious. If it is severe call 911 IMMEDIATELY. If it is mild, please call our office. If you take any of these medications: Glipizide/Metformin, Avandament, Glucavance, please do not take 48 hours after completing test unless otherwise instructed.  Please allow 2-4 weeks for scheduling of routine cardiac CTs. Some insurance companies require a pre-authorization which may delay scheduling of this test.   For non-scheduling related questions, please contact the cardiac imaging nurse navigator should you have any questions/concerns: Marchia Bond, Cardiac Imaging Nurse Navigator Gordy Clement, Cardiac Imaging Nurse Navigator [facility] Direct Office Dial: [phone]   For scheduling needs, including cancellations and rescheduling, please call [place], [phone].     Follow-Up: At [facility], you and your health needs are our priority.  As part of our continuing mission to provide you with exceptional heart care, we have created designated Provider Care Teams.  These Care Teams include your primary Cardiologist (physician) and Advanced Practice Providers (APPs -  Physician Assistants and Nurse  Practitioners) who all work together to provide you with the care you need, when you need it.  We recommend signing up for the patient portal called "MyChart".  Sign up information is provided on this After Visit Summary.  MyChart is used to connect with patients for Virtual Visits (Telemedicine).  Patients are able to view lab/test results, encounter notes, upcoming appointments, etc.  Non-urgent messages can be sent to your provider as well.   To learn more about what you can do with MyChart, go to [email or web address].    Your next appointment:   3 month(s)  The format for your next appointment:   In Person  Provider:   Berniece Salines, DO   Other Instructions    Adopting a Healthy Lifestyle.  Know what a healthy weight is for you (roughly BMI <25) and aim to maintain this   Aim for 7+ servings of fruits and vegetables daily   65-80+ fluid ounces of water or unsweet tea for healthy kidneys   Limit to max 1 drink of alcohol per day; avoid smoking/tobacco   Limit animal fats in diet for cholesterol and heart health - choose grass fed whenever available   Avoid highly processed foods, and foods high in saturated/trans fats   Aim for low stress - take time to unwind and care for your mental health   Aim for 150 min of moderate intensity exercise weekly for heart health, and weights twice weekly for bone health   Aim for 7-9 hours of sleep daily   When it comes to diets, agreement about the perfect plan isnt easy to find, even among the experts. Experts at the [employer] developed an idea known as the Healthy Eating Plate. Just imagine a plate divided into logical, healthy portions.   The emphasis is on diet quality:   Load up on vegetables and fruits - one-half of your plate: Aim for color and variety, and remember that potatoes dont count.   Go for whole grains - one-quarter of your plate: Whole wheat, barley, wheat berries, quinoa, oats, brown rice,  and foods made with them. If you want pasta, go with whole wheat pasta.   Protein power - one-quarter of your plate: Fish, chicken, beans, and nuts are all healthy, versatile protein sources. Limit red meat.   The diet, however, does go beyond the plate, offering a few other suggestions.   Use healthy plant oils, such as olive, canola,  soy, corn, sunflower and peanut. Check the labels, and avoid partially hydrogenated oil, which have unhealthy trans fats.   If youre thirsty, drink water. Coffee and tea are good in moderation, but skip sugary drinks and limit milk and dairy products to one or two daily servings.   The type of carbohydrate in the diet is more important than the amount. Some sources of carbohydrates, such as vegetables, fruits, whole grains, and beans-are healthier than others.   Finally, stay active  Signed, Berniece Salines, DO  06/18/2021 4:18 PM    [facility]

## 2021-06-18 NOTE — Patient Instructions (Signed)
Medication Instructions:  Your physician has recommended you make the following change in your medication:  START: Nitroglycerin 0.4 mg take one tablet by mouth every 5 minutes up to three times as needed for chest pain.  RESTART: Lasix 20 mg on Monday, Wednesday, and Friday.  *If you need a refill on your cardiac medications before your next appointment, please call your pharmacy*   Lab Work: None If you have labs (blood work) drawn today and your tests are completely normal, you will receive your results only by: [place] (if you have MyChart) OR A paper copy in the mail If you have any lab test that is abnormal or we need to change your treatment, we will call you to review the results.   Testing/Procedures: Your physician has requested that you have an echocardiogram. Echocardiography is a painless test that uses sound waves to create images of your heart. It provides your doctor with information about the size and shape of your heart and how well your heart's chambers and valves are working. This procedure takes approximately one hour. There are no restrictions for this procedure.  A zio monitor was ordered today. It will remain on for 3 days. You will then return monitor and event diary in provided box. It takes 1-2 weeks for report to be downloaded and returned to [place]. We will call you with the results. If monitor falls off or has orange flashing light, please call [facility] for further instructions.     Your cardiac CT will be scheduled at the below location:   [facility] [address] [place], [place] [address] [phone]  If scheduled at [facility], please arrive at the [facility] main entrance (entrance A) of [facility] 30 minutes prior to test start time. Proceed to the [facility] Radiology Department (first floor) to check-in and test prep.  Please follow these instructions carefully (unless otherwise directed):  On the Night Before  the Test: Be sure to Drink plenty of water. Do not consume any caffeinated/decaffeinated beverages or chocolate 12 hours prior to your test. Do not take any antihistamines 12 hours prior to your test.  On the Day of the Test: Drink plenty of water until 1 hour prior to the test. Do not eat any food 4 hours prior to the test. You may take your regular medications prior to the test.  Take metoprolol (Lopressor) two hours prior to test. HOLD Furosemide morning of the test. FEMALES- please wear underwire-free bra if available, avoid dresses & tight clothing      After the Test: Drink plenty of water. After receiving IV contrast, you may experience a mild flushed feeling. This is normal. On occasion, you may experience a mild rash up to 24 hours after the test. This is not dangerous. If this occurs, you can take Benadryl 25 mg and increase your fluid intake. If you experience trouble breathing, this can be serious. If it is severe call 911 IMMEDIATELY. If it is mild, please call our office. If you take any of these medications: Glipizide/Metformin, Avandament, Glucavance, please do not take 48 hours after completing test unless otherwise instructed.  Please allow 2-4 weeks for scheduling of routine cardiac CTs. Some insurance companies require a pre-authorization which may delay scheduling of this test.   For non-scheduling related questions, please contact the cardiac imaging nurse navigator should you have any questions/concerns: Marchia Bond, Cardiac Imaging Nurse Navigator Gordy Clement, Cardiac Imaging Nurse Navigator [facility] Direct Office Dial: [phone]   For scheduling needs, including cancellations and rescheduling, please call [place], [phone].     Follow-Up: At [facility], you and your health needs are our priority.  As part of our continuing mission to provide you with exceptional heart care, we have created designated Provider  Care Teams.  These Care Teams include your primary Cardiologist (physician) and Advanced Practice Providers (APPs -  Physician Assistants and Nurse Practitioners) who all work together to provide you with the care you need, when you need it.  We recommend signing up for the patient portal called "MyChart".  Sign up information is provided on this After Visit Summary.  MyChart is used to connect with patients for Virtual Visits (Telemedicine).  Patients are able to view lab/test results, encounter notes, upcoming appointments, etc.  Non-urgent messages can be sent to your provider as well.   To learn more about what you can do with MyChart, go to [email or web address].    Your next appointment:   3 month(s)  The format for your next appointment:   In Person  Provider:   Berniece Salines, DO   Other Instructions

## 2021-06-19 ENCOUNTER — Telehealth: Payer: Self-pay | Admitting: Cardiology

## 2021-06-19 NOTE — Telephone Encounter (Signed)
Pt advised that medical grade tape will also work. Pt verbalized understanding and had no additional questions.

## 2021-06-19 NOTE — Telephone Encounter (Signed)
heart monitor keeps coming loose from her skin, pt put water proof bandaid on it to keep it from falling off, is this ok? please advise.

## 2021-06-23 DIAGNOSIS — R002 Palpitations: Secondary | ICD-10-CM

## 2021-06-26 ENCOUNTER — Other Ambulatory Visit: Payer: Self-pay

## 2021-06-26 ENCOUNTER — Telehealth: Payer: Self-pay | Admitting: Cardiology

## 2021-06-26 DIAGNOSIS — Z01810 Encounter for preprocedural cardiovascular examination: Secondary | ICD-10-CM

## 2021-06-26 NOTE — Telephone Encounter (Signed)
New message:    Patient calling to get a order for lab work for NL.

## 2021-06-26 NOTE — Telephone Encounter (Signed)
Pt is returning call in regards to bloodwork. Please advise pt further

## 2021-06-26 NOTE — Telephone Encounter (Signed)
Pt to have BMET at the [place] office prior to her CT.

## 2021-06-26 NOTE — Telephone Encounter (Signed)
Encounter not needed

## 2021-06-26 NOTE — Telephone Encounter (Signed)
Called pt to inquire about the blood work she is referring to. No answer at this time, left a message for her to return the call.

## 2021-06-26 NOTE — Addendum Note (Signed)
Addended by: Stephani Police on: 06/26/2021 02:49 PM   Modules accepted: Orders

## 2021-07-01 ENCOUNTER — Other Ambulatory Visit: Payer: Medicare Other

## 2021-07-03 ENCOUNTER — Telehealth (HOSPITAL_COMMUNITY): Payer: Self-pay | Admitting: *Deleted

## 2021-07-03 LAB — BASIC METABOLIC PANEL
BUN/Creatinine Ratio: 18 (ref 12–28)
BUN: 17 mg/dL (ref 8–27)
CO2: 21 mmol/L (ref 20–29)
Calcium: 10.5 mg/dL — ABNORMAL HIGH (ref 8.7–10.3)
Chloride: 92 mmol/L — ABNORMAL LOW (ref 96–106)
Creatinine, Ser: 0.94 mg/dL (ref 0.57–1.00)
Glucose: 133 mg/dL — ABNORMAL HIGH (ref 70–99)
Potassium: 5.4 mmol/L — ABNORMAL HIGH (ref 3.5–5.2)
Sodium: 135 mmol/L (ref 134–144)
eGFR: 67 mL/min/{1.73_m2} (ref >59)

## 2021-07-03 NOTE — Telephone Encounter (Signed)
Patient returning calling regarding upcoming cardiac imaging study; pt verbalizes understanding of appt date/time, parking situation and where to check in, pre-test NPO status and medications ordered, and verified current allergies; name and call back number provided for further questions should they arise  Gordy Clement RN Navigator Cardiac Imaging [facility] [phone] office [phone] cell  Patient to take 100mg  metoprolol tartrate two hours prior to cardiac CT scan.

## 2021-07-03 NOTE — Telephone Encounter (Signed)
Attempted to call patient regarding upcoming cardiac CT appointment. °Left message on voicemail with name and callback number ° °Shawni Volkov RN Navigator Cardiac Imaging °[facility] °[phone] Office °[phone] Cell ° °

## 2021-07-05 ENCOUNTER — Ambulatory Visit (HOSPITAL_COMMUNITY)
Admission: RE | Admit: 2021-07-05 | Discharge: 2021-07-05 | Disposition: A | Discharge status: Home / Self Care | Payer: Medicare Other | Source: Ambulatory Visit | Attending: Cardiology | Admitting: Cardiology

## 2021-07-05 ENCOUNTER — Other Ambulatory Visit: Payer: Self-pay

## 2021-07-05 ENCOUNTER — Encounter (HOSPITAL_COMMUNITY): Payer: Self-pay

## 2021-07-05 DIAGNOSIS — I700 Atherosclerosis of aorta: Secondary | ICD-10-CM | POA: Insufficient documentation

## 2021-07-05 DIAGNOSIS — R0602 Shortness of breath: Secondary | ICD-10-CM | POA: Diagnosis present

## 2021-07-05 DIAGNOSIS — R079 Chest pain, unspecified: Secondary | ICD-10-CM | POA: Insufficient documentation

## 2021-07-05 MED ORDER — NITROGLYCERIN 0.4 MG SL SUBL
SUBLINGUAL_TABLET | SUBLINGUAL | Status: AC
  Filled 2021-07-05: qty 2

## 2021-07-05 MED ORDER — NITROGLYCERIN 0.4 MG SL SUBL
0.8000 mg | SUBLINGUAL_TABLET | Freq: Once | SUBLINGUAL | Status: AC
  Administered 2021-07-05: 0.8 mg via SUBLINGUAL

## 2021-07-05 MED ORDER — IOHEXOL 350 MG/ML SOLN
100.0000 mL | Freq: Once | INTRAVENOUS | Status: AC | PRN
  Administered 2021-07-05: 100 mL via INTRAVENOUS

## 2021-07-08 ENCOUNTER — Other Ambulatory Visit: Payer: Self-pay

## 2021-07-08 ENCOUNTER — Telehealth: Payer: Self-pay | Admitting: Cardiology

## 2021-07-08 DIAGNOSIS — E875 Hyperkalemia: Secondary | ICD-10-CM

## 2021-07-08 DIAGNOSIS — Z79899 Other long term (current) drug therapy: Secondary | ICD-10-CM

## 2021-07-08 NOTE — Telephone Encounter (Signed)
Pt returning Jasmine's call in regards to lab results.

## 2021-07-08 NOTE — Telephone Encounter (Signed)
Spoke with patient, see chart.    

## 2021-07-09 ENCOUNTER — Other Ambulatory Visit: Payer: Self-pay

## 2021-07-09 ENCOUNTER — Encounter: Payer: Self-pay | Admitting: Neurology

## 2021-07-09 ENCOUNTER — Ambulatory Visit: Payer: Medicare Other | Admitting: Neurology

## 2021-07-09 VITALS — BP 149/90 | HR 112 | Ht <= 58 in | Wt 153.0 lb

## 2021-07-09 DIAGNOSIS — R413 Other amnesia: Secondary | ICD-10-CM | POA: Diagnosis not present

## 2021-07-09 DIAGNOSIS — G43019 Migraine without aura, intractable, without status migrainosus: Secondary | ICD-10-CM

## 2021-07-09 MED ORDER — DONEPEZIL HCL 10 MG PO TABS: 10.0000 mg | ORAL_TABLET | Freq: Every day | ORAL | 3 refills | Status: DC

## 2021-07-09 NOTE — Progress Notes (Signed)
Reason for visit: Fibromyalgia, chronic fatigue, mild memory disorder, headache  Kristin Pope is an 65 y.o. female  History of present illness:  Kristin Pope is a 65 year old right-handed white female with a history of fibromyalgia and chronic fatigue.  She indicates that she remains very inactive, she spends much of her day in bed.  She has been having some headaches but the headaches are not severe, Tylenol will help some.  She continues to have some mild memory issues, mainly having troubles with numbers, she indicates that she can remember names for people fairly well.  She has not given up any activities of daily living since last seen, she functions independently.  She has had some recent issues with chest discomfort and she has been waking up with increased heart rate.  She has been undergoing a cardiac evaluation for this.  She was to be set up for a sleep evaluation but she never had this done.  She has been seen through Dr. Rexene Alberts previously.  Past Medical History:  Diagnosis Date   Abnormal finding on MRI of brain 02/07/2009   Qualifier: Diagnosis of  By: Jimmye Norman, LPN, [place] Cun[place]    ALLERGIC RHINITIS 03/29/2007   Qualifier: Diagnosis of  By: Tiney Rouge CMA, [place], B12 DEFICIENCY 05/18/2007   Qualifier: Diagnosis of  By: Jimmye Norman, LPN, [place] M    Anemia, pernicious    b12 def.   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 03/2018: Chronic; noted MRI 11/2015   Cerebrovascular disease or lesion 12/30/2015   Cervical radiculopathy 06/16/2017   Cervical spondylosis with radiculopathy    C2 -- C7   Chronic fatigue    Chronic pain    neck, back   Common migraine with intractable migraine 09/01/2018   CONSTIPATION, SLOW  TRANSIT 10/04/2010   Qualifier: Diagnosis of  By: Arnoldo Morale MD, Balinda Quails    CYSTITIS, CHRONIC INTERSTITIAL 06/27/2009   Qualifier: Diagnosis of  By: Arnoldo Morale MD, Balinda Quails    DDD (degenerative disc disease), lumbosacral    DEGENERATIVE DISC DISEASE, CERVICAL SPINE 05/18/2007   Qualifier: Diagnosis of  By: Arnoldo Morale MD, John E    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 Followed by gynecology   Eczema    Essential hypertension 09/14/2007   Qualifier: Diagnosis of  By: Billy Coast CMA AAMA, [place]     Family history of adverse reaction to anesthesia    sister has problems waking up   Family history of breast cancer    FIBROMYALGIA 03/29/2007   Qualifier: Diagnosis of  By: Tiney Rouge CMA, Ellison Hughs     Frontal headache 04/16/2018   GERD (gastroesophageal reflux disease)    Headache    constant headaches   HIP PAIN, LEFT, CHRONIC 07/04/2008   Qualifier: Diagnosis of  By: Arnoldo Morale MD, John E    History of colonic diverticulitis 08/16/2010   w/ perforation (lower GI bleed)--- resolved without surgerical intervention   History of TIA (transient ischemic attack) 12/28/2015   per MRI - chronic left cerebellar infarct---  no residual   Hx of pyelonephritis 09/2005   due to UTI   Hypercalcemia 11/18/2007   Qualifier: Diagnosis of  By: Arnoldo Morale MD, John E    Hyperlipidemia LDL goal <70 12/30/2015   Hypertension    Hyponatremia 04/16/2018   Insomnia 03/03/2011   Irritable bowel syndrome 08/14/2009   Qualifier: Diagnosis of  By: Arnoldo Morale MD, John E    Lipoma of other specified sites 04/13/2009   Annotation: over right ankle Qualifier: Diagnosis of  By: Arnoldo Morale MD, Balinda Quails    LOW BACK PAIN 05/26/2008   Qualifier: Diagnosis of  By: Arnoldo Morale MD, John E    Lumbosacral spondylosis    L2-3, L4-5   MALAISE AND FATIGUE 07/04/2008   Qualifier:  Diagnosis of  By: Arnoldo Morale MD, Balinda Quails    Memory difficulty 08/11/2019   Mild obstructive sleep apnea    per study 06/ 2009 mild osa  AHI 13/hr---  recommendation given mouth appliance, loss wt., cpap   Mild pulmonary hypertension ([place]) 12/30/2015   Mixed hyperlipidemia    Muscle weakness (generalized) 12/20/2008   Qualifier: Diagnosis of  By: Arnoldo Morale MD, Balinda Quails    OA (osteoarthritis)    right shoulder AC joint   Obese 07/19/2015   OSA (obstructive sleep apnea) 03/03/2011   NPSG 2009:  AHI 13/hr.  No treatment   Osteopenia of multiple sites 01/29/2017   Polypharmacy 05/03/2018   Postmenopausal bleeding 09/14/2017   Formatting of this note might be different from the original. Followed by gynecology   Pre-diabetes    Proteinuria 12/23/2007   Qualifier: Diagnosis of  By: Arnoldo Morale MD, John E    Recurrent UTI 12/22/2017   Right rotator cuff tear    S/P left THA, AA 07/17/2015   SYNCOPE 03/13/2009   Qualifier: Diagnosis of  By: Arnoldo Morale MD, Balinda Quails    Thrombocytosis 05/22/2017   Formatting of this note might be different from the original. Overview:  New; mild. Trend at regular f/u.  08/2018: Slight to 419k Formatting of this note might be different from the original. New; mild. Trend at regular f/u.   TIA (transient ischemic attack) 12/28/2015   Unspecified hypothyroidism 05/16/2009   Qualifier: Diagnosis of  By: Arnoldo Morale MD, John E    Vitamin B12 deficiency 06/03/2018   Vitamin D deficiency 06/03/2018    Past Surgical History:  Procedure Laterality Date   BREAST SURGERY     breast biopsy-benign   COLONOSCOPY  last one 08-08-2009   DILATATION & CURRETTAGE/HYSTEROSCOPY WITH RESECTOCOPE  02-03-2011   dr Dellis Filbert  [facility]   polypectomy   RADIAL KERATOTOMY     RADIAL OPTIC NEUROTOMY     twice in lumbar area of back-every 6 months   SHOULDER ARTHROSCOPY WITH ROTATOR CUFF REPAIR Right 08/13/2017   Procedure: RIGHT SHOULDER ARTHROSCOPY, DEBRIDEMENT, BICEPS TENOTOMY, ROTATOR CUFF REPAIR, DISTAL  CLAVICLE RESECTION;  Surgeon: Sydnee Cabal, MD;  Location: [facility];  Service: Orthopedics;  Laterality: Right;   TOTAL HIP ARTHROPLASTY Left 07/17/2015   Procedure: LEFT TOTAL HIP ARTHROPLASTY ANTERIOR APPROACH;  Surgeon: Paralee Cancel, MD;  Location: [facility];  Service: Orthopedics;  Laterality: Left;   TOTAL HIP ARTHROPLASTY Right 08/21/2015   Procedure: RIGHT TOTAL HIP ARTHROPLASTY ANTERIOR APPROACH;  Surgeon: Paralee Cancel, MD;  Location: [facility];  Service: Orthopedics;  Laterality: Right;   TRANSTHORACIC ECHOCARDIOGRAM  12/30/2015   ef 78-29%, grade 1 diastolic dysfunction/  mild MR/ trivial TR    Family History  Problem Relation Age of Onset  Heart disease Mother    Heart attack Mother    Asthma Mother    Breast cancer Mother 41   Other Father        car accident   Breast cancer Sister 62   Lung cancer Sister    Other Sister        ATM mutation   Heart attack Brother 36   Coronary artery disease Brother    Breast cancer Maternal Grandmother    Rheum arthritis Maternal Grandmother    Stroke Maternal Grandfather    Brain cancer Paternal Grandfather    Lung cancer Paternal Grandfather    Breast cancer Maternal Aunt 50   Alzheimer's disease Paternal Grandmother    Testicular cancer Cousin 25   Other Other        ATM mutation    Social history:  reports that she quit smoking about 36 years ago. Her smoking use included cigarettes. She has never used smokeless tobacco. She reports current alcohol use. She reports that she does not use drugs.    Allergies  Allergen Reactions   Morphine And Related Rash    Headaches - pt can take hydromorphone   Aminoglutethimide    Buprenorphine Hcl    Fentanyl Nausea And Vomiting    Fentanyl patch - nausea and vomiting.   Gluten Meal Other (See Comments)    Pt avoids eating gluten   Griseofulvin Ultramicrosize [Griseofulvin]    Hydrocodone Other (See Comments)    Urinary retention---  "can only take 1/2 tablet low  dose"   Lactose Intolerance (Gi) Other (See Comments)    Upset stomach   Lipitor [Atorvastatin] Other (See Comments)    Pain, myalgias   Monosodium Glutamate Other (See Comments)    MSG  --- Increases blood pressure   Statins Other (See Comments)    Myalgia   Tetracycline Nausea And Vomiting   Tramadol Other (See Comments)    "Passed out"   Codeine Rash    REACTION: Insomnia "crazy dreams"    Medications:  Prior to Admission medications   Medication Sig Start Date End Date Taking? Authorizing Provider  acetaminophen (TYLENOL) 650 MG CR tablet Take 1,300 mg every 8 (eight) hours as needed by mouth for pain.    [provider]  amitriptyline (ELAVIL) 50 MG tablet Take 50 mg by mouth at bedtime.    [provider]  Armodafinil 150 MG tablet Take 150 mg by mouth 2 (two) times daily as needed (energy boost).    [provider]  aspirin 81 MG EC tablet Take 1 tablet by mouth daily. 04/26/21   [provider]  celecoxib (CELEBREX) 200 MG capsule Take 200 mg by mouth 2 (two) times daily as needed for mild pain.    [provider]  cholecalciferol (VITAMIN D) 1000 UNITS tablet Take 1,000 Units daily by mouth.     [provider]  clonazePAM (KLONOPIN) 1 MG tablet Take 1 mg by mouth 4 (four) times daily as needed for anxiety.    [provider]  cyanocobalamin 100 MCG tablet Take 100 mcg by mouth daily.    [provider]  donepezil (ARICEPT) 10 MG tablet Take 1 tablet (10 mg total) by mouth at bedtime. 07/05/20   Suzzanne Cloud, NP  DULoxetine (CYMBALTA) 60 MG capsule Take 60 mg by mouth at bedtime.  07/28/15   [provider]  escitalopram (LEXAPRO) 10 MG tablet Take 10 mg by mouth every evening. 05/24/21   [provider]  ezetimibe (ZETIA) 10 MG tablet Take 1 tablet (10 mg total) by mouth daily. 12/30/15   Rama, Venetia Maxon, MD  furosemide (LASIX) 20 MG tablet Take 1 tablet (20 mg total) by mouth every  Monday, Wednesday, and Friday. 06/19/21   Tobb, [name], DO  gabapentin (NEURONTIN) 300 MG capsule Take 300 mg by mouth 3 (three) times daily.    [provider]  HYDROmorphone (DILAUDID) 2 MG tablet Take 2 mg by mouth 3 (three) times daily as needed for severe pain.    [provider]  lubiprostone (AMITIZA) 24 MCG capsule Take 24 mcg by mouth at bedtime.    [provider]  methocarbamol (ROBAXIN) 500 MG tablet Take 500 mg by mouth 4 (four) times daily as needed for muscle spasms. 05/27/21   [provider]  methocarbamol (ROBAXIN) 750 MG tablet Take 500 mg by mouth 3 (three) times daily as needed (pain).    [provider]  metoprolol tartrate (LOPRESSOR) 100 MG tablet Take 1 tablet (100 mg total) by mouth once for 1 dose. Take one tablet two hours prior to your cardiac CT 06/18/21 06/18/21  Tobb, [name], DO  montelukast (SINGULAIR) 10 MG tablet Take 10 mg by mouth at bedtime.     [provider]  nitrofurantoin, macrocrystal-monohydrate, (MACROBID) 100 MG capsule Take 100 mg at bedtime by mouth. Continuous course 12/07/15   [provider]  nitroGLYCERIN (NITROSTAT) 0.4 MG SL tablet Place 1 tablet (0.4 mg total) under the tongue every 5 (five) minutes as needed. 06/18/21 09/16/21  Tobb, [name], DO  omeprazole (PRILOSEC) 40 MG capsule Take 40 mg at bedtime by mouth.     [provider]  telmisartan (MICARDIS) 40 MG tablet Take 20 mg by mouth daily. 03/22/19 06/18/21  [provider]  tizanidine (ZANAFLEX) 2 MG capsule Take 2 mg by mouth 4 (four) times daily as needed for muscle spasms.    [provider]  triamcinolone cream (KENALOG) 0.1 % Apply 1 application topically 2 (two) times daily.    [provider]  zolpidem (AMBIEN CR) 12.5 MG CR tablet Take 12.5 mg by mouth at bedtime as needed for sleep.    [provider]    ROS:  Out of a complete 14 system review of symptoms, the patient complains  only of the following symptoms, and all other reviewed systems are negative.  Mild memory problems Chronic fatigue Chest discomfort  Blood pressure (!) 149/90, pulse (!) 112, height _0  (1.473 m), weight 153 lb (69.4 kg).  Physical Exam  General: The patient is alert and cooperative at the time of the examination.  The patient is moderately obese.  Skin: No significant peripheral edema is noted.   Neurologic Exam  Mental status: The patient is alert and oriented x 3 at the time of the examination. The patient has apparent normal recent and remote memory, with an apparently normal attention span and concentration ability.  Mini-Mental status examination done today shows a total score of 30/30.  The patient is able to name 18 four-legged animals in 60 seconds.   Cranial nerves: Facial symmetry is present. Speech is normal, no aphasia or dysarthria is noted. Extraocular movements are full. Visual fields are full.  Motor: The patient has good strength in all 4 extremities.  Sensory examination: Soft touch sensation is symmetric on the face, arms, and legs.  Coordination: The patient has good finger-nose-finger and heel-to-shin bilaterally.  Gait and station: The patient has a normal gait. Tandem gait  is slightly unsteady. Romberg is negative. No drift is seen.  Reflexes: Deep tendon reflexes are symmetric.   Assessment/Plan:  1.  Fibromyalgia, chronic fatigue  2.  Frequent mild headache  3.  Reported mild memory disturbance  The patient is on Aricept, she believes that she has gained benefit from this medication, we will continue the medication and a prescription was called in.  The patient will follow up here in 1 year, sooner if needed.  In the future, she can be followed through Dr. Rexene Alberts.  The patient indicates that she does not wish to pursue the sleep evaluation.  Jill Alexanders MD 07/09/2021 2:51 PM  [facility] [address] [place],  [address]  Phone [phone] Fax [phone]

## 2021-07-11 ENCOUNTER — Other Ambulatory Visit: Payer: Self-pay

## 2021-07-11 ENCOUNTER — Ambulatory Visit (INDEPENDENT_AMBULATORY_CARE_PROVIDER_SITE_OTHER): Payer: Medicare Other

## 2021-07-11 DIAGNOSIS — Z79899 Other long term (current) drug therapy: Secondary | ICD-10-CM

## 2021-07-11 DIAGNOSIS — R0602 Shortness of breath: Secondary | ICD-10-CM

## 2021-07-11 DIAGNOSIS — E875 Hyperkalemia: Secondary | ICD-10-CM

## 2021-07-11 LAB — ECHOCARDIOGRAM COMPLETE
Area-P 1/2: 7.66 cm2
S' Lateral: 2.9 cm

## 2021-07-12 LAB — BASIC METABOLIC PANEL
BUN/Creatinine Ratio: 24 (ref 12–28)
BUN: 24 mg/dL (ref 8–27)
CO2: 22 mmol/L (ref 20–29)
Calcium: 10.2 mg/dL (ref 8.7–10.3)
Chloride: 96 mmol/L (ref 96–106)
Creatinine, Ser: 1 mg/dL (ref 0.57–1.00)
Glucose: 125 mg/dL — ABNORMAL HIGH (ref 70–99)
Potassium: 4.6 mmol/L (ref 3.5–5.2)
Sodium: 140 mmol/L (ref 134–144)
eGFR: 62 mL/min/{1.73_m2} (ref >59)

## 2021-07-12 LAB — MAGNESIUM: Magnesium: 2 mg/dL (ref 1.6–2.3)

## 2021-07-22 ENCOUNTER — Telehealth: Payer: Self-pay | Admitting: *Deleted

## 2021-07-22 NOTE — Telephone Encounter (Signed)
Patient states she has not mailed her monitor back yet. She will try to send it back within the next day or two

## 2021-07-22 NOTE — Telephone Encounter (Signed)
Left message for pt to call us back and let us know if she has mailed her monitor back to [facility]. They have no received it yet.

## 2021-08-13 ENCOUNTER — Ambulatory Visit: Payer: Medicare Other | Admitting: Cardiology

## 2021-08-13 ENCOUNTER — Other Ambulatory Visit: Payer: Self-pay

## 2021-08-13 ENCOUNTER — Encounter: Payer: Self-pay | Admitting: Cardiology

## 2021-08-13 ENCOUNTER — Other Ambulatory Visit: Payer: Self-pay | Admitting: Cardiology

## 2021-08-13 VITALS — BP 156/104 | HR 109 | Ht <= 58 in | Wt 151.4 lb

## 2021-08-13 DIAGNOSIS — E785 Hyperlipidemia, unspecified: Secondary | ICD-10-CM

## 2021-08-13 DIAGNOSIS — I471 Supraventricular tachycardia: Secondary | ICD-10-CM | POA: Diagnosis not present

## 2021-08-13 DIAGNOSIS — I10 Essential (primary) hypertension: Secondary | ICD-10-CM | POA: Diagnosis not present

## 2021-08-13 MED ORDER — FUROSEMIDE 20 MG PO TABS: 20.0000 mg | ORAL_TABLET | Freq: Every day | ORAL | 3 refills | Status: DC

## 2021-08-13 MED ORDER — TELMISARTAN 20 MG PO TABS: 20.0000 mg | ORAL_TABLET | Freq: Two times a day (BID) | ORAL | 3 refills | Status: DC

## 2021-08-13 MED ORDER — CARVEDILOL 6.25 MG PO TABS: 6.2500 mg | ORAL_TABLET | Freq: Two times a day (BID) | ORAL | 3 refills | Status: DC

## 2021-08-13 NOTE — Progress Notes (Signed)
Cardiology Office Note:    Date:  08/13/2021   ID:  Kristin Pope, DOB [birth year], MRN [id number]  PCP:  Jonathon Bellows, PA-C  Cardiologist:  Berniece Salines, DO  Electrophysiologist:  None   Referring MD: Keane Scrape*   " I am still having palpitations"   History of Present Illness:    Kristin Pope is a 65 y.o. female with a hx of hypertension, hyperlipidemia, and prediabetes is here today for follow-up visit.  I saw the patient on June 18, 2021 at that time she had been experiencing intermittent chest discomfort with shortness of breath and palpitations.  Given the significance of her symptoms we repeated an echocardiogram, due to risk factors CAD coronary CTA and placed a monitor on the patient.  Since her visit she was able to get all her testing done.  Some result has been called out to the patient.  She has a since I saw her her blood pressure has elevated.  She has had some of the palpitations.  She did reach out to her PCP who declined to send her antihypertensive medications but the patient did not notify my office of this.  No other complaints at this time. Past Medical History:  Diagnosis Date   Abnormal finding on MRI of brain 02/07/2009   Qualifier: Diagnosis of  By: Jimmye Norman, LPN, Winfield Cunas    ALLERGIC RHINITIS 03/29/2007   Qualifier: Diagnosis of  By: Tiney Rouge CMA, [place], B12 DEFICIENCY 05/18/2007   Qualifier: Diagnosis of  By: Jimmye Norman, LPN, Bonnye M    Anemia, pernicious    b12 def.   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 03/2018: Chronic; noted MRI 11/2015   Cerebrovascular disease or lesion 12/30/2015   Cervical radiculopathy 06/16/2017   Cervical spondylosis with  radiculopathy    C2 -- C7   Chronic fatigue    Chronic pain    neck, back   Common migraine with intractable migraine 09/01/2018   CONSTIPATION, SLOW TRANSIT 10/04/2010   Qualifier: Diagnosis of  By: Arnoldo Morale MD, Balinda Quails    CYSTITIS, CHRONIC INTERSTITIAL 06/27/2009   Qualifier: Diagnosis of  By: Arnoldo Morale MD, Balinda Quails    DDD (degenerative disc disease), lumbosacral    DEGENERATIVE DISC DISEASE, CERVICAL SPINE 05/18/2007   Qualifier: Diagnosis of  By: Arnoldo Morale MD, John E    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 Followed by gynecology   Eczema    Essential hypertension 09/14/2007   Qualifier: Diagnosis of  By: Billy Coast CMA AAMA, Debby     Family history of adverse reaction to anesthesia    sister has problems waking up   Family history of breast cancer    FIBROMYALGIA 03/29/2007   Qualifier: Diagnosis of  By: Tiney Rouge CMA, Ellison Hughs     Frontal headache 04/16/2018   GERD (gastroesophageal reflux disease)    Headache    constant headaches   HIP PAIN, LEFT, CHRONIC 07/04/2008   Qualifier: Diagnosis of  By: Arnoldo Morale MD, Balinda Quails    History  of colonic diverticulitis 08/16/2010   w/ perforation (lower GI bleed)--- resolved without surgerical intervention   History of TIA (transient ischemic attack) 12/28/2015   per MRI - chronic left cerebellar infarct--- no residual   Hx of pyelonephritis 09/2005   due to UTI   Hypercalcemia 11/18/2007   Qualifier: Diagnosis of  By: Arnoldo Morale MD, John E    Hyperlipidemia LDL goal <70 12/30/2015   Hypertension    Hyponatremia 04/16/2018   Insomnia 03/03/2011   Irritable bowel syndrome 08/14/2009   Qualifier: Diagnosis of  By: Arnoldo Morale MD, John E    Lipoma of other specified sites 04/13/2009   Annotation: over right ankle Qualifier: Diagnosis of  By: Arnoldo Morale MD, Balinda Quails    LOW BACK PAIN  05/26/2008   Qualifier: Diagnosis of  By: Arnoldo Morale MD, John E    Lumbosacral spondylosis    L2-3, L4-5   MALAISE AND FATIGUE 07/04/2008   Qualifier: Diagnosis of  By: Arnoldo Morale MD, Balinda Quails    Memory difficulty 08/11/2019   Mild obstructive sleep apnea    per study 06/ 2009 mild osa  AHI 13/hr---  recommendation given mouth appliance, loss wt., cpap   Mild pulmonary hypertension ([place]) 12/30/2015   Mixed hyperlipidemia    Muscle weakness (generalized) 12/20/2008   Qualifier: Diagnosis of  By: Arnoldo Morale MD, Balinda Quails    OA (osteoarthritis)    right shoulder AC joint   Obese 07/19/2015   OSA (obstructive sleep apnea) 03/03/2011   NPSG 2009:  AHI 13/hr.  No treatment   Osteopenia of multiple sites 01/29/2017   Polypharmacy 05/03/2018   Postmenopausal bleeding 09/14/2017   Formatting of this note might be different from the original. Followed by gynecology   Pre-diabetes    Proteinuria 12/23/2007   Qualifier: Diagnosis of  By: Arnoldo Morale MD, John E    Recurrent UTI 12/22/2017   Right rotator cuff tear    S/P left THA, AA 07/17/2015   SYNCOPE 03/13/2009   Qualifier: Diagnosis of  By: Arnoldo Morale MD, Balinda Quails    Thrombocytosis 05/22/2017   Formatting of this note might be different from the original. Overview:  New; mild. Trend at regular f/u.  08/2018: Slight to 419k Formatting of this note might be different from the original. New; mild. Trend at regular f/u.   TIA (transient ischemic attack) 12/28/2015   Unspecified hypothyroidism 05/16/2009   Qualifier: Diagnosis of  By: Arnoldo Morale MD, John E    Vitamin B12 deficiency 06/03/2018   Vitamin D deficiency 06/03/2018    Past Surgical History:  Procedure Laterality Date   BREAST SURGERY     breast biopsy-benign   COLONOSCOPY  last one 08-08-2009   DILATATION & CURRETTAGE/HYSTEROSCOPY WITH RESECTOCOPE  02-03-2011   dr Dellis Filbert  [facility]   polypectomy   RADIAL KERATOTOMY     RADIAL OPTIC NEUROTOMY     twice in lumbar area of back-every 6 months   SHOULDER  ARTHROSCOPY WITH ROTATOR CUFF REPAIR Right 08/13/2017   Procedure: RIGHT SHOULDER ARTHROSCOPY, DEBRIDEMENT, BICEPS TENOTOMY, ROTATOR CUFF REPAIR, DISTAL CLAVICLE RESECTION;  Surgeon: Sydnee Cabal, MD;  Location: [place];  Service: Orthopedics;  Laterality: Right;   TOTAL HIP ARTHROPLASTY Left 07/17/2015   Procedure: LEFT TOTAL HIP ARTHROPLASTY ANTERIOR APPROACH;  Surgeon: Paralee Cancel, MD;  Location: [facility];  Service: Orthopedics;  Laterality: Left;   TOTAL HIP ARTHROPLASTY Right 08/21/2015   Procedure: RIGHT TOTAL HIP ARTHROPLASTY ANTERIOR APPROACH;  Surgeon: Paralee Cancel, MD;  Location: [facility];  Service: Orthopedics;  Laterality: Right;   TRANSTHORACIC ECHOCARDIOGRAM  12/30/2015   ef 82-95%, grade 1 diastolic dysfunction/  mild MR/ trivial TR    Current Medications: Current Meds  Medication Sig   acetaminophen (TYLENOL) 650 MG CR tablet Take 1,300 mg every 8 (eight) hours as needed by mouth for pain.   amitriptyline (ELAVIL) 50 MG tablet Take 50 mg by mouth at bedtime.   Armodafinil 150 MG tablet Take 150 mg by mouth 2 (two) times daily as needed (energy boost).   aspirin 81 MG EC tablet Take 1 tablet by mouth daily.   celecoxib (CELEBREX) 200 MG capsule Take 200 mg by mouth 2 (two) times daily as needed for mild pain.   cholecalciferol (VITAMIN D) 1000 UNITS tablet Take 1,000 Units daily by mouth.    clonazePAM (KLONOPIN) 1 MG tablet Take 1 mg by mouth 4 (four) times daily as needed for anxiety.   cyanocobalamin 100 MCG tablet Take 100 mcg by mouth daily.   donepezil (ARICEPT) 10 MG tablet Take 1 tablet (10 mg total) by mouth at bedtime.   DULoxetine (CYMBALTA) 60 MG capsule Take 60 mg by mouth at bedtime.    ezetimibe (ZETIA) 10 MG tablet Take 1 tablet (10 mg total) by mouth daily.   furosemide (LASIX) 20 MG tablet Take 1 tablet (20 mg total) by mouth every Monday, Wednesday, and Friday.   lubiprostone (AMITIZA) 24 MCG capsule Take 24 mcg by mouth at bedtime.    methocarbamol (ROBAXIN) 500 MG tablet Take 500 mg by mouth 4 (four) times daily as needed for muscle spasms.   montelukast (SINGULAIR) 10 MG tablet Take 10 mg by mouth at bedtime.    nitrofurantoin, macrocrystal-monohydrate, (MACROBID) 100 MG capsule Take 100 mg at bedtime by mouth. Continuous course   nitroGLYCERIN (NITROSTAT) 0.4 MG SL tablet Place 1 tablet (0.4 mg total) under the tongue every 5 (five) minutes as needed.   omeprazole (PRILOSEC) 40 MG capsule Take 40 mg at bedtime by mouth.    telmisartan (MICARDIS) 40 MG tablet Take 20 mg by mouth daily.   tizanidine (ZANAFLEX) 2 MG capsule Take 2 mg by mouth 4 (four) times daily as needed for muscle spasms.   triamcinolone cream (KENALOG) 0.1 % Apply 1 application topically 2 (two) times daily.   zolpidem (AMBIEN CR) 12.5 MG CR tablet Take 12.5 mg by mouth at bedtime as needed for sleep.     Allergies:   Morphine and related, Aminoglutethimide, Buprenorphine hcl, Fentanyl, Gluten meal, Griseofulvin ultramicrosize [griseofulvin], Hydrocodone, Lactose intolerance (gi), Lipitor [atorvastatin], Monosodium glutamate, Statins, Tetracycline, Tramadol, and Codeine   Social History   Socioeconomic History   Marital status: Divorced    Spouse name: Not on file   Number of children: 0   Years of education: 18   Highest education level: Not on file  Occupational History   Occupation: retired    Fish farm manager: UNEMPLOYED  Tobacco Use   Smoking status: Former    Years: 11.00    Types: Cigarettes    Quit date: 09/29/1984    Years since quitting: 36.8   Smokeless tobacco: Never  Vaping Use   Vaping Use: Never used  Substance and Sexual Activity   Alcohol use: Yes    Comment: wine occassionally   Drug use: No    Comment: hx occasional marjuana (as documented 2017)   Sexual activity: Not on file  Other Topics Concern   Not on file  Social History Narrative   Lives alone   Caffeine use: Drinks coffee  sometimes   Right handed    Social  Determinants of Health   Financial Resource Strain: Not on file  Food Insecurity: Not on file  Transportation Needs: Not on file  Physical Activity: Not on file  Stress: Not on file  Social Connections: Not on file     Family History: The patient's family history includes Alzheimer's disease in her paternal grandmother; Asthma in her mother; Brain cancer in her paternal grandfather; Breast cancer in her maternal grandmother; Breast cancer (age of onset: 28) in her maternal aunt; Breast cancer (age of onset: 67) in her sister; Breast cancer (age of onset: 90) in her mother; Coronary artery disease in her brother; Heart attack in her mother; Heart attack (age of onset: 76) in her brother; Heart disease in her mother; Lung cancer in her paternal grandfather and sister; Other in her father, sister, and another family member; Rheum arthritis in her maternal grandmother; Stroke in her maternal grandfather; Testicular cancer (age of onset: 92) in her cousin.  ROS:   Review of Systems  Constitution: Negative for decreased appetite, fever and weight gain.  HENT: Negative for congestion, ear discharge, hoarse voice and sore throat.   Eyes: Negative for discharge, redness, vision loss in right eye and visual halos.  Cardiovascular: Negative for chest pain, dyspnea on exertion, leg swelling, orthopnea and palpitations.  Respiratory: Negative for cough, hemoptysis, shortness of breath and snoring.   Endocrine: Negative for heat intolerance and polyphagia.  Hematologic/Lymphatic: Negative for bleeding problem. Does not bruise/bleed easily.  Skin: Negative for flushing, nail changes, rash and suspicious lesions.  Musculoskeletal: Negative for arthritis, joint pain, muscle cramps, myalgias, neck pain and stiffness.  Gastrointestinal: Negative for abdominal pain, bowel incontinence, diarrhea and excessive appetite.  Genitourinary: Negative for decreased libido, genital sores and incomplete emptying.   Neurological: Negative for brief paralysis, focal weakness, headaches and loss of balance.  Psychiatric/Behavioral: Negative for altered mental status, depression and suicidal ideas.  Allergic/Immunologic: Negative for HIV exposure and persistent infections.    EKGs/Labs/Other Studies Reviewed:    The following studies were reviewed today:   EKG: None today  Coronary CTA July 05, 2021 COMPARISON:  04/10/2020 chest radiograph.  Chest CT of 06/11/2006.   FINDINGS: Vascular: Aortic atherosclerosis. No central pulmonary embolism, on this non-dedicated study.   Mediastinum/Nodes: No imaged thoracic adenopathy.   Lungs/Pleura: No pleural fluid.  Clear imaged lungs.   Upper Abdomen: Normal imaged portions of the liver, spleen, stomach.   Musculoskeletal: No acute osseous abnormality. Mid and lower thoracic spondylosis.   IMPRESSION: 1.  No acute findings in the imaged extracardiac chest. 2.  Aortic Atherosclerosis (ICD10-I70.0).     Electronically Signed   By: Abigail Miyamoto M.D.   On: 07/08/2021 08:14    Addended by Abigail Miyamoto, MD on 07/08/2021  8:16 AM   Study Result  Narrative & Impression  CLINICAL DATA:  65 Year-old White Female   EXAM: Cardiac/Coronary  CTA   TECHNIQUE: The patient was scanned on a [employer].   FINDINGS: Scan was triggered in the descending thoracic aorta. Axial non-contrast 3 mm slices were carried out through the heart. The data set was analyzed on a dedicated work station and scored using the [place]. Gantry rotation speed was 250 msecs and collimation was .6 mm. 0.8 mg of sl NTG was given. The 3D data set was reconstructed in 5% intervals of the 67-82 % of the R-R cycle. Diastolic phases were analyzed on a dedicated work station using  MPR, MIP and VRT modes. The patient received 100 cc of contrast.   Aorta:  Normal size.  No calcifications.  No dissection.   Main Pulmonary Artery: Normal size of the pulmonary  artery.   Aortic Valve:  Tri-leaflet.  No calcifications.   Coronary Arteries:  Normal coronary origin.  Right dominance.   Coronary Calcium Score:   Left main: 0   Left anterior descending artery: 0   Left circumflex artery: 0   Right coronary artery: 0   Total: 0   Percentile: 1st for age, sex, and race matched control.   RCA is a large dominant artery that gives rise to PDA and PLA. There is no significant plaque.   Left main is a large artery that gives rise to LAD and LCX arteries. There is no significant plaque.   LAD is a small vessel that gives rise to one large D1 Branch that bifurcates. There is no significant plaque.   LCX is a non-dominant artery that gives rise to one large OM1 branch. There is no significant plaque.   Other findings:   Normal pulmonary vein drainage into the left atrium.   Normal left atrial appendage without a thrombus.   Extra-cardiac findings: See attached radiology report for non-cardiac structures.   IMPRESSION: 1. Coronary calcium score of 0. This was 1st percentile for age, sex, and race matched control.   2. Normal coronary origin with right dominance.   3. CAD-RADS 0. No evidence of CAD (0%). Consider non-atherosclerotic causes of chest pain.   RECOMMENDATIONS:   Coronary artery calcium (CAC) score is a strong predictor of incident coronary heart disease (CHD) and provides predictive information beyond traditional risk factors. CAC scoring is reasonable to use in the decision to withhold, postpone, or initiate statin therapy in intermediate-risk or selected borderline-risk asymptomatic adults (age 69-75 years and LDL-C >=70 to <190 mg/dL) who do not have diabetes or established atherosclerotic cardiovascular disease (ASCVD).* In intermediate-risk (10-year ASCVD risk >=7.5% to <20%) adults or selected borderline-risk (10-year ASCVD risk >=5% to <7.5%) adults in whom a CAC score is measured for the purpose of making a  treatment decision the following recommendations have been made:   If CAC = 0, it is reasonable to withhold statin therapy and reassess in 5 to 10 years, as long as higher risk conditions are absent (diabetes mellitus, family history of premature CHD in first degree relatives (males <55 years; females <65 years), cigarette smoking, LDL >=190 mg/dL or other independent risk factors).   If CAC is 1 to 99, it is reasonable to initiate statin therapy for patients >=68 years of age.   If CAC is >=100 or >=75th percentile, it is reasonable to initiate statin therapy at any age.   Cardiology referral should be considered for patients with CAC scores =400 or >=75th percentile.   *2018 AHA/ACC/AACVPR/AAPA/ABC/ACPM/ADA/AGS/APhA/ASPC/NLA/PCNA Guideline on the Management of Blood Cholesterol: A Report of the American College of Cardiology/American Heart Association Task Force on Clinical Practice Guidelines. J Am Coll Cardiol. 2019;73(24):3168-3209.   Rudean Haskell, MD   Electronically Signed: By: Rudean Haskell M.D.    Echocardiogram July 11, 2021 IMPRESSIONS     1. Left ventricular ejection fraction, by estimation, is 60 to 65%. The  left ventricle has normal function. The left ventricle has no regional  wall motion abnormalities. Left ventricular diastolic parameters are  consistent with Grade I diastolic  dysfunction (impaired relaxation).   2. Right ventricular systolic function is normal. The right ventricular  size is normal. There is normal pulmonary artery systolic pressure.   3. The mitral valve is normal in structure. No evidence of mitral valve  regurgitation. No evidence of mitral stenosis.   4. The aortic valve is normal in structure. Aortic valve regurgitation is  not visualized. No aortic stenosis is present.   5. The inferior vena cava is normal in size with greater than 50%  respiratory variability, suggesting right atrial pressure of 3 mmHg.    FINDINGS   Left Ventricle: Left ventricular ejection fraction, by estimation, is 60  to 65%. The left ventricle has normal function. The left ventricle has no  regional wall motion abnormalities. The left ventricular internal cavity  size was normal in size. There is   no left ventricular hypertrophy. Left ventricular diastolic parameters  are consistent with Grade I diastolic dysfunction (impaired relaxation).   Right Ventricle: The right ventricular size is normal. No increase in  right ventricular wall thickness. Right ventricular systolic function is  normal. There is normal pulmonary artery systolic pressure. The tricuspid  regurgitant velocity is 2.67 m/s, and   with an assumed right atrial pressure of 3 mmHg, the estimated right  ventricular systolic pressure is 81.8 mmHg.   Left Atrium: Left atrial size was normal in size.   Right Atrium: Right atrial size was normal in size.   Pericardium: There is no evidence of pericardial effusion.   Mitral Valve: The mitral valve is normal in structure. No evidence of  mitral valve regurgitation. No evidence of mitral valve stenosis.   Tricuspid Valve: The tricuspid valve is normal in structure. Tricuspid  valve regurgitation is not demonstrated. No evidence of tricuspid  stenosis.   Aortic Valve: The aortic valve is normal in structure. Aortic valve  regurgitation is not visualized. No aortic stenosis is present.   Pulmonic Valve: The pulmonic valve was normal in structure. Pulmonic valve  regurgitation is not visualized. No evidence of pulmonic stenosis.   Aorta: The aortic root is normal in size and structure.   Venous: The inferior vena cava is normal in size with greater than 50%  respiratory variability, suggesting right atrial pressure of 3 mmHg.   IAS/Shunts: No atrial level shunt detected by color flow Doppler.      ZIO monitor Patch Wear Time:  5 days and 2 hours starting June 26, 2021. Indication:  Palpitation   Patient had a minimal HR of 46 bpm, maximum HR of 193 bpm, and average HR of 75 bpm.   Predominant underlying rhythm was Sinus Rhythm.    26 Supraventricular Tachycardia runs occurred, the run with the fastest interval lasting 11 beats with a max rate of 193 bpm, the longest lasting 12 beats with an avg rate of 135 bpm.   Premature atrial complexes were rare (<1.0%). Premature ventricular complexes were rare (<1.0%).   No atrial fibrillation, no ventricular tachycardia, no pauses noted.   Conclusion: This study is remarkable for supraventricular tachycardia which is likely atrial tachycardia with variable block..  Recent Labs: 07/11/2021: BUN 24; Creatinine, Ser 1.00; Magnesium 2.0; Potassium 4.6; Sodium 140  Recent Lipid Panel    Component Value Date/Time   CHOL 286 (H) 12/29/2015 1214   TRIG 177 (H) 12/29/2015 1214   HDL 57 12/29/2015 1214   CHOLHDL 5.0 12/29/2015 1214   VLDL 35 12/29/2015 1214   LDLCALC 194 (H) 12/29/2015 1214   LDLDIRECT 111.0 11/12/2011 1610    Physical Exam:    VS:  BP (!) 156/104  Pulse (!) 109   Ht 4\' 10"  (1.473 m)   Wt 151 lb 6.4 oz (68.7 kg)   SpO2 99%   BMI 31.64 kg/m     Wt Readings from Last 3 Encounters:  08/13/21 151 lb 6.4 oz (68.7 kg)  07/09/21 153 lb (69.4 kg)  06/18/21 152 lb (68.9 kg)     GEN: Well nourished, well developed in no acute distress HEENT: Normal NECK: No JVD; No carotid bruits LYMPHATICS: No lymphadenopathy CARDIAC: S1S2 noted,RRR, no murmurs, rubs, gallops RESPIRATORY:  Clear to auscultation without rales, wheezing or rhonchi  ABDOMEN: Soft, non-tender, non-distended, +bowel sounds, no guarding. EXTREMITIES: No edema, No cyanosis, no clubbing MUSCULOSKELETAL:  No deformity  SKIN: Warm and dry NEUROLOGIC:  Alert and oriented x 3, non-focal PSYCHIATRIC:  Normal affect, good insight  ASSESSMENT:    No diagnosis found. PLAN:    She is hypertensive in the office today.  I am going to add Coreg  6.25 mg twice daily to her medication regimen which also hopefully will help with the palpitations. She will continue the Micardis at 20 mg twice daily.  And she has been taking her Lasix on a daily basis we will continue that as well.  Discussed her testing results with her.  All of her questions has been answered in the office today.  Blood work will be done today.  The patient is in agreement with the above plan. The patient left the office in stable condition.  The patient will follow up in   Medication Adjustments/Labs and Tests Ordered: Current medicines are reviewed at length with the patient today.  Concerns regarding medicines are outlined above.  No orders of the defined types were placed in this encounter.  No orders of the defined types were placed in this encounter.   There are no Patient Instructions on file for this visit.   Adopting a Healthy Lifestyle.  Know what a healthy weight is for you (roughly BMI <25) and aim to maintain this   Aim for 7+ servings of fruits and vegetables daily   65-80+ fluid ounces of water or unsweet tea for healthy kidneys   Limit to max 1 drink of alcohol per day; avoid smoking/tobacco   Limit animal fats in diet for cholesterol and heart health - choose grass fed whenever available   Avoid highly processed foods, and foods high in saturated/trans fats   Aim for low stress - take time to unwind and care for your mental health   Aim for 150 min of moderate intensity exercise weekly for heart health, and weights twice weekly for bone health   Aim for 7-9 hours of sleep daily   When it comes to diets, agreement about the perfect plan isnt easy to find, even among the experts. Experts at the [employer] developed an idea known as the Healthy Eating Plate. Just imagine a plate divided into logical, healthy portions.   The emphasis is on diet quality:   Load up on vegetables and fruits - one-half of your plate:  Aim for color and variety, and remember that potatoes dont count.   Go for whole grains - one-quarter of your plate: Whole wheat, barley, wheat berries, quinoa, oats, brown rice, and foods made with them. If you want pasta, go with whole wheat pasta.   Protein power - one-quarter of your plate: Fish, chicken, beans, and nuts are all healthy, versatile protein sources. Limit red meat.   The diet, however, does go  beyond the plate, offering a few other suggestions.   Use healthy plant oils, such as olive, canola, soy, corn, sunflower and peanut. Check the labels, and avoid partially hydrogenated oil, which have unhealthy trans fats.   If youre thirsty, drink water. Coffee and tea are good in moderation, but skip sugary drinks and limit milk and dairy products to one or two daily servings.   The type of carbohydrate in the diet is more important than the amount. Some sources of carbohydrates, such as vegetables, fruits, whole grains, and beans-are healthier than others.   Finally, stay active  Signed, Berniece Salines, DO  08/13/2021 11:27 AM    [facility]

## 2021-08-13 NOTE — Patient Instructions (Addendum)
Medication Instructions:  Your physician has recommended you make the following change in your medication:  START: Coreg 6.25 mg twice daily INCREASE: Micardis 20 mg twice daily  CONTINUE: Lasix 20 mg once daily.  *If you need a refill on your cardiac medications before your next appointment, please call your pharmacy*   Lab Work: Your physician recommends that you return for lab work in:  TODAY: BMET, [place] If you have labs (blood work) drawn today and your tests are completely normal, you will receive your results only by: MyChart Message (if you have [place]) OR A paper copy in the mail If you have any lab test that is abnormal or we need to change your treatment, we will call you to review the results.   Testing/Procedures: None   Follow-Up: At [facility], you and your health needs are our priority.  As part of our continuing mission to provide you with exceptional heart care, we have created designated Provider Care Teams.  These Care Teams include your primary Cardiologist (physician) and Advanced Practice Providers (APPs -  Physician Assistants and Nurse Practitioners) who all work together to provide you with the care you need, when you need it.  We recommend signing up for the patient portal called "MyChart".  Sign up information is provided on this After Visit Summary.  MyChart is used to connect with patients for Virtual Visits (Telemedicine).  Patients are able to view lab/test results, encounter notes, upcoming appointments, etc.  Non-urgent messages can be sent to your provider as well.   To learn more about what you can do with MyChart, go to [email or web address].    Your next appointment:   3 month(s)  The format for your next appointment:   In Person  Provider:   Berniece Salines, DO     Other Instructions

## 2021-08-14 ENCOUNTER — Telehealth: Payer: Self-pay | Admitting: Cardiology

## 2021-08-14 LAB — BASIC METABOLIC PANEL
BUN/Creatinine Ratio: 28 (ref 12–28)
BUN: 19 mg/dL (ref 8–27)
CO2: 25 mmol/L (ref 20–29)
Calcium: 10.5 mg/dL — ABNORMAL HIGH (ref 8.7–10.3)
Chloride: 103 mmol/L (ref 96–106)
Creatinine, Ser: 0.69 mg/dL (ref 0.57–1.00)
Glucose: 104 mg/dL — ABNORMAL HIGH (ref 70–99)
Potassium: 4.6 mmol/L (ref 3.5–5.2)
Sodium: 146 mmol/L — ABNORMAL HIGH (ref 134–144)
eGFR: 96 mL/min/{1.73_m2} (ref >59)

## 2021-08-14 LAB — MAGNESIUM: Magnesium: 1.9 mg/dL (ref 1.6–2.3)

## 2021-08-14 MED ORDER — TELMISARTAN 40 MG PO TABS
20.0000 mg | ORAL_TABLET | Freq: Two times a day (BID) | ORAL | 3 refills | Status: DC
Start: 2021-08-14 — End: 2022-07-03

## 2021-08-14 NOTE — Telephone Encounter (Signed)
Rx changed to telmisartan 40mg  tablets -- take 1/2 tablet by mouth twice daily   Patient called w/this update

## 2021-08-14 NOTE — Telephone Encounter (Signed)
Pt c/o medication issue:  1. Name of Medication: telmisartan (MICARDIS) 20 MG tablet  2. How are you currently taking this medication (dosage and times per day)? Patient taking 20 mg twice daily  3. Are you having a reaction (difficulty breathing--STAT)?   4. What is your medication issue? Patient said her pharmacy will not fill the rx the way it is written now because the patient's insurance will not approve it. The rx needs to be changed to a 40 mg tablet that the patient would cut in half   Patient is out of her current medication   Please send updated rx to [id number] - [place], [place]. [address]

## 2021-08-29 ENCOUNTER — Encounter: Payer: Self-pay | Admitting: Cardiology

## 2021-08-29 ENCOUNTER — Ambulatory Visit: Payer: Medicare Other | Admitting: Cardiology

## 2021-08-29 ENCOUNTER — Telehealth: Payer: Self-pay

## 2021-08-29 ENCOUNTER — Other Ambulatory Visit: Payer: Self-pay

## 2021-08-29 VITALS — BP 138/90 | HR 94 | Ht <= 58 in | Wt 150.0 lb

## 2021-08-29 DIAGNOSIS — I272 Pulmonary hypertension, unspecified: Secondary | ICD-10-CM

## 2021-08-29 DIAGNOSIS — G459 Transient cerebral ischemic attack, unspecified: Secondary | ICD-10-CM | POA: Diagnosis not present

## 2021-08-29 DIAGNOSIS — I10 Essential (primary) hypertension: Secondary | ICD-10-CM | POA: Diagnosis not present

## 2021-08-29 DIAGNOSIS — I471 Supraventricular tachycardia: Secondary | ICD-10-CM

## 2021-08-29 MED ORDER — CARVEDILOL 12.5 MG PO TABS: 12.5000 mg | ORAL_TABLET | Freq: Two times a day (BID) | ORAL | 3 refills | Status: DC

## 2021-08-29 NOTE — Telephone Encounter (Signed)
Called pt to let her know she does not have to come to the appointment today since she was seen 11/15. She did not answer, left a message for her to call back to reschedule for February.

## 2021-08-29 NOTE — Telephone Encounter (Signed)
   Pt returning call, she said, she do need to come ine today to discuss the new meds Dr. Harriet Masson prescribed her

## 2021-08-29 NOTE — Progress Notes (Signed)
Cardiology Office Note:    Date:  08/29/2021   ID:  Kristin Pope, DOB [birth year], MRN [id number]  PCP:  Jonathon Bellows, PA-C  Cardiologist:  Berniece Salines, DO  Electrophysiologist:  None   Referring MD: Keane Scrape*   Chief Complaint  Patient presents with   Follow-up    3 months.    History of Present Illness:    Kristin Pope is a 65 y.o. female with a hx of paroxysmal SVT, hypertension, hyperlipidemia, and prediabetes is here today for follow-up visit.  I saw the patient on June 18, 2021 at that time she had been experiencing intermittent chest discomfort with shortness of breath and palpitations.  Given the significance of her symptoms we repeated an echocardiogram, due to risk factors CAD coronary CTA and placed a monitor on the patient.  She was seen on August 13, 2021 at that time she did tell me that she had been having some elevated blood pressures.  During that visit we discussed her testing result.  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  She has also been taking her Lasix on a daily basis. She requested to be seen because she feels as if her blood pressure is still not under control.  She tells me she had been taking her carvedilol at least 3 times a day. But the problem list the patient is not taking her blood pressure  at home when she feels the blood pressure is elevated.  Past Medical History:  Diagnosis Date   Abnormal finding on MRI of brain 02/07/2009   Qualifier: Diagnosis of  By: Jimmye Norman, LPN, [place]    ALLERGIC RHINITIS 03/29/2007   Qualifier: Diagnosis of  By: Tiney Rouge CMA, [place], B12 DEFICIENCY 05/18/2007   Qualifier: Diagnosis of  By: Jimmye Norman, LPN, [place] M    Anemia, pernicious    b12 def.   Anxiety    Arthritis    Atrophic kidney 12/09/2017   Biceps tendon tear    right   BRUXISM 04/13/2009   Qualifier: Diagnosis of  By:  Arnoldo Morale MD, John E    Cerebral infarction involving left cerebellar artery ([place]) 12/30/2015   Formatting of this note might be different from the original. 03/2018: Chronic; noted MRI 11/2015   Cerebrovascular disease or lesion 12/30/2015   Cervical radiculopathy 06/16/2017   Cervical spondylosis with radiculopathy    C2 -- C7   Chronic fatigue    Chronic pain    neck, back   Common migraine with intractable migraine 09/01/2018   CONSTIPATION, SLOW TRANSIT 10/04/2010   Qualifier: Diagnosis of  By: Arnoldo Morale MD, Balinda Quails    CYSTITIS, CHRONIC INTERSTITIAL 06/27/2009   Qualifier: Diagnosis of  By: Arnoldo Morale MD, Balinda Quails    DDD (degenerative disc disease), lumbosacral    DEGENERATIVE DISC DISEASE, CERVICAL SPINE 05/18/2007   Qualifier: Diagnosis of  By: Arnoldo Morale MD, John E    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 Followed by gynecology   Eczema    Essential hypertension 09/14/2007   Qualifier: Diagnosis of  By: Billy Coast CMA AAMA, [place]     Family history of adverse reaction to anesthesia    sister has problems waking up   [facility]  history of breast cancer    FIBROMYALGIA 03/29/2007   Qualifier: Diagnosis of  By: Tiney Rouge CMA, [place] Hughs     Frontal headache 04/16/2018   GERD (gastroesophageal reflux disease)    Headache    constant headaches   HIP PAIN, LEFT, CHRONIC 07/04/2008   Qualifier: Diagnosis of  By: Arnoldo Morale MD, Balinda Quails    History of colonic diverticulitis 08/16/2010   w/ perforation (lower GI bleed)--- resolved without surgerical intervention   History of TIA (transient ischemic attack) 12/28/2015   per MRI - chronic left cerebellar infarct--- no residual   Hx of pyelonephritis 09/2005   due to UTI   Hypercalcemia 11/18/2007   Qualifier: Diagnosis of  By: Arnoldo Morale MD, John E    Hyperlipidemia LDL goal <70 12/30/2015    Hypertension    Hyponatremia 04/16/2018   Insomnia 03/03/2011   Irritable bowel syndrome 08/14/2009   Qualifier: Diagnosis of  By: Arnoldo Morale MD, John E    Lipoma of other specified sites 04/13/2009   Annotation: over right ankle Qualifier: Diagnosis of  By: Arnoldo Morale MD, Balinda Quails    LOW BACK PAIN 05/26/2008   Qualifier: Diagnosis of  By: Arnoldo Morale MD, John E    Lumbosacral spondylosis    L2-3, L4-5   MALAISE AND FATIGUE 07/04/2008   Qualifier: Diagnosis of  By: Arnoldo Morale MD, Balinda Quails    Memory difficulty 08/11/2019   Mild obstructive sleep apnea    per study 06/ 2009 mild osa  AHI 13/hr---  recommendation given mouth appliance, loss wt., cpap   Mild pulmonary hypertension ([place]) 12/30/2015   Mixed hyperlipidemia    Muscle weakness (generalized) 12/20/2008   Qualifier: Diagnosis of  By: Arnoldo Morale MD, Balinda Quails    OA (osteoarthritis)    right shoulder AC joint   Obese 07/19/2015   OSA (obstructive sleep apnea) 03/03/2011   NPSG 2009:  AHI 13/hr.  No treatment   Osteopenia of multiple sites 01/29/2017   Polypharmacy 05/03/2018   Postmenopausal bleeding 09/14/2017   Formatting of this note might be different from the original. Followed by gynecology   Pre-diabetes    Proteinuria 12/23/2007   Qualifier: Diagnosis of  By: Arnoldo Morale MD, John E    Recurrent UTI 12/22/2017   Right rotator cuff tear    S/P left THA, AA 07/17/2015   SYNCOPE 03/13/2009   Qualifier: Diagnosis of  By: Arnoldo Morale MD, Balinda Quails    Thrombocytosis 05/22/2017   Formatting of this note might be different from the original. Overview:  New; mild. Trend at regular f/u.  08/2018: Slight to 419k Formatting of this note might be different from the original. New; mild. Trend at regular f/u.   TIA (transient ischemic attack) 12/28/2015   Unspecified hypothyroidism 05/16/2009   Qualifier: Diagnosis of  By: Arnoldo Morale MD, John E    Vitamin B12 deficiency 06/03/2018   Vitamin D deficiency 06/03/2018    Past Surgical History:  Procedure  Laterality Date   BREAST SURGERY     breast biopsy-benign   COLONOSCOPY  last one 08-08-2009   DILATATION & CURRETTAGE/HYSTEROSCOPY WITH RESECTOCOPE  02-03-2011   dr Dellis Filbert  [facility]   polypectomy   RADIAL KERATOTOMY     RADIAL OPTIC NEUROTOMY     twice in lumbar area of back-every 6 months   SHOULDER ARTHROSCOPY WITH ROTATOR CUFF REPAIR Right 08/13/2017   Procedure: RIGHT SHOULDER ARTHROSCOPY, DEBRIDEMENT, BICEPS TENOTOMY, ROTATOR CUFF REPAIR, DISTAL CLAVICLE RESECTION;  Surgeon: Sydnee Cabal, MD;  Location: [place] [facility];  Service: Orthopedics;  Laterality: Right;   TOTAL HIP ARTHROPLASTY Left 07/17/2015   Procedure: LEFT TOTAL HIP ARTHROPLASTY ANTERIOR APPROACH;  Surgeon: Paralee Cancel, MD;  Location: [facility];  Service: Orthopedics;  Laterality: Left;   TOTAL HIP ARTHROPLASTY Right 08/21/2015   Procedure: RIGHT TOTAL HIP ARTHROPLASTY ANTERIOR APPROACH;  Surgeon: Paralee Cancel, MD;  Location: [facility];  Service: Orthopedics;  Laterality: Right;   TRANSTHORACIC ECHOCARDIOGRAM  12/30/2015   ef 44-96%, grade 1 diastolic dysfunction/  mild MR/ trivial TR    Current Medications: Current Meds  Medication Sig   acetaminophen (TYLENOL) 650 MG CR tablet Take 1,300 mg every 8 (eight) hours as needed by mouth for pain.   amitriptyline (ELAVIL) 50 MG tablet Take 50 mg by mouth at bedtime.   Armodafinil 150 MG tablet Take 150 mg by mouth 2 (two) times daily as needed (energy boost).   aspirin 81 MG EC tablet Take 1 tablet by mouth daily.   carvedilol (COREG) 12.5 MG tablet Take 1 tablet (12.5 mg total) by mouth 2 (two) times daily.   celecoxib (CELEBREX) 200 MG capsule Take 200 mg by mouth 2 (two) times daily as needed for mild pain.   cholecalciferol (VITAMIN D) 1000 UNITS tablet Take 1,000 Units daily by mouth.    clonazePAM (KLONOPIN) 1 MG tablet Take 1 mg by mouth 4 (four) times daily as needed for anxiety.   cyanocobalamin 100 MCG tablet Take 100 mcg by mouth daily.   donepezil  (ARICEPT) 10 MG tablet Take 1 tablet (10 mg total) by mouth at bedtime.   DULoxetine (CYMBALTA) 60 MG capsule Take 60 mg by mouth at bedtime.    escitalopram (LEXAPRO) 10 MG tablet Take 10 mg by mouth every evening.   ezetimibe (ZETIA) 10 MG tablet Take 1 tablet (10 mg total) by mouth daily.   furosemide (LASIX) 20 MG tablet Take 1 tablet (20 mg total) by mouth daily.   gabapentin (NEURONTIN) 300 MG capsule Take 300 mg by mouth 3 (three) times daily.   HYDROmorphone (DILAUDID) 2 MG tablet Take 2 mg by mouth 3 (three) times daily as needed for severe pain.   lubiprostone (AMITIZA) 24 MCG capsule Take 24 mcg by mouth at bedtime.   methocarbamol (ROBAXIN) 500 MG tablet Take 500 mg by mouth 4 (four) times daily as needed for muscle spasms.   montelukast (SINGULAIR) 10 MG tablet Take 10 mg by mouth at bedtime.    nitrofurantoin, macrocrystal-monohydrate, (MACROBID) 100 MG capsule Take 100 mg at bedtime by mouth. Continuous course   nitroGLYCERIN (NITROSTAT) 0.4 MG SL tablet Place 1 tablet (0.4 mg total) under the tongue every 5 (five) minutes as needed.   omeprazole (PRILOSEC) 40 MG capsule Take 40 mg at bedtime by mouth.    telmisartan (MICARDIS) 40 MG tablet Take 0.5 tablets (20 mg total) by mouth in the morning and at bedtime.   tizanidine (ZANAFLEX) 2 MG capsule Take 2 mg by mouth 4 (four) times daily as needed for muscle spasms.   triamcinolone cream (KENALOG) 0.1 % Apply 1 application topically 2 (two) times daily.   zolpidem (AMBIEN CR) 12.5 MG CR tablet Take 12.5 mg by mouth at bedtime as needed for sleep.   [DISCONTINUED] carvedilol (COREG) 6.25 MG tablet Take 1 tablet (6.25 mg total) by mouth 2 (two) times daily.     Allergies:   Morphine and related, Aminoglutethimide, Buprenorphine hcl, Fentanyl, Gluten meal, Griseofulvin ultramicrosize [griseofulvin], Hydrocodone, Lactose intolerance (gi), Lipitor [atorvastatin], Monosodium glutamate, Statins, Tetracycline, Tramadol, and Codeine  Social History   Socioeconomic History   Marital status: Divorced    Spouse name: Not on file   Number of children: 0   Years of education: 18   Highest education level: Not on file  Occupational History   Occupation: retired    Fish farm manager: UNEMPLOYED  Tobacco Use   Smoking status: Former    Years: 11.00    Types: Cigarettes    Quit date: 09/29/1984    Years since quitting: 36.9   Smokeless tobacco: Never  Vaping Use   Vaping Use: Never used  Substance and Sexual Activity   Alcohol use: Yes    Comment: wine occassionally   Drug use: No    Comment: hx occasional marjuana (as documented 2017)   Sexual activity: Not on file  Other Topics Concern   Not on file  Social History Narrative   Lives alone   Caffeine use: Drinks coffee sometimes   Right handed    Social Determinants of Health   Financial Resource Strain: Not on file  Food Insecurity: Not on file  Transportation Needs: Not on file  Physical Activity: Not on file  Stress: Not on file  Social Connections: Not on file     Family History: The patient's family history includes Alzheimer's disease in her paternal grandmother; Asthma in her mother; Brain cancer in her paternal grandfather; Breast cancer in her maternal grandmother; Breast cancer (age of onset: 1) in her maternal aunt; Breast cancer (age of onset: 36) in her sister; Breast cancer (age of onset: 81) in her mother; Coronary artery disease in her brother; Heart attack in her mother; Heart attack (age of onset: 73) in her brother; Heart disease in her mother; Lung cancer in her paternal grandfather and sister; Other in her father, sister, and another family member; Rheum arthritis in her maternal grandmother; Stroke in her maternal grandfather; Testicular cancer (age of onset: 76) in her cousin.  ROS:   Review of Systems  Constitution: Negative for decreased appetite, fever and weight gain.  HENT: Negative for congestion, ear discharge, hoarse voice and  sore throat.   Eyes: Negative for discharge, redness, vision loss in right eye and visual halos.  Cardiovascular: Negative for chest pain, dyspnea on exertion, leg swelling, orthopnea and palpitations.  Respiratory: Negative for cough, hemoptysis, shortness of breath and snoring.   Endocrine: Negative for heat intolerance and polyphagia.  Hematologic/Lymphatic: Negative for bleeding problem. Does not bruise/bleed easily.  Skin: Negative for flushing, nail changes, rash and suspicious lesions.  Musculoskeletal: Negative for arthritis, joint pain, muscle cramps, myalgias, neck pain and stiffness.  Gastrointestinal: Negative for abdominal pain, bowel incontinence, diarrhea and excessive appetite.  Genitourinary: Negative for decreased libido, genital sores and incomplete emptying.  Neurological: Negative for brief paralysis, focal weakness, headaches and loss of balance.  Psychiatric/Behavioral: Negative for altered mental status, depression and suicidal ideas.  Allergic/Immunologic: Negative for HIV exposure and persistent infections.    EKGs/Labs/Other Studies Reviewed:    The following studies were reviewed today:   EKG: None today  Coronary CTA July 05, 2021 COMPARISON:  04/10/2020 chest radiograph.  Chest CT of 06/11/2006.   FINDINGS: Vascular: Aortic atherosclerosis. No central pulmonary embolism, on this non-dedicated study.   Mediastinum/Nodes: No imaged thoracic adenopathy.   Lungs/Pleura: No pleural fluid.  Clear imaged lungs.   Upper Abdomen: Normal imaged portions of the liver, spleen, stomach.   Musculoskeletal: No acute osseous abnormality. Mid and lower thoracic spondylosis.   IMPRESSION: 1.  No acute findings in the  imaged extracardiac chest. 2.  Aortic Atherosclerosis (ICD10-I70.0).     Electronically Signed   By: Abigail Miyamoto M.D.   On: 07/08/2021 08:14    Addended by Abigail Miyamoto, MD on 07/08/2021  8:16 AM    Study Result   Narrative & Impression   CLINICAL DATA:  65 Year-old White Female   EXAM: Cardiac/Coronary  CTA   TECHNIQUE: The patient was scanned on a [employer].   FINDINGS: Scan was triggered in the descending thoracic aorta. Axial non-contrast 3 mm slices were carried out through the heart. The data set was analyzed on a dedicated work station and scored using the [place]. Gantry rotation speed was 250 msecs and collimation was .6 mm. 0.8 mg of sl NTG was given. The 3D data set was reconstructed in 5% intervals of the 67-82 % of the R-R cycle. Diastolic phases were analyzed on a dedicated work station using MPR, MIP and VRT modes. The patient received 100 cc of contrast.   Aorta:  Normal size.  No calcifications.  No dissection.   Main Pulmonary Artery: Normal size of the pulmonary artery.   Aortic Valve:  Tri-leaflet.  No calcifications.   Coronary Arteries:  Normal coronary origin.  Right dominance.   Coronary Calcium Score:   Left main: 0   Left anterior descending artery: 0   Left circumflex artery: 0   Right coronary artery: 0   Total: 0   Percentile: 1st for age, sex, and race matched control.   RCA is a large dominant artery that gives rise to PDA and PLA. There is no significant plaque.   Left main is a large artery that gives rise to LAD and LCX arteries. There is no significant plaque.   LAD is a small vessel that gives rise to one large D1 Branch that bifurcates. There is no significant plaque.   LCX is a non-dominant artery that gives rise to one large OM1 branch. There is no significant plaque.   Other findings:   Normal pulmonary vein drainage into the left atrium.   Normal left atrial appendage without a thrombus.   Extra-cardiac findings: See attached radiology report for non-cardiac structures.   IMPRESSION: 1. Coronary calcium score of 0. This was 1st percentile for age, sex, and race matched control.   2. Normal coronary origin with right  dominance.   3. CAD-RADS 0. No evidence of CAD (0%). Consider non-atherosclerotic causes of chest pain.   RECOMMENDATIONS:   Coronary artery calcium (CAC) score is a strong predictor of incident coronary heart disease (CHD) and provides predictive information beyond traditional risk factors. CAC scoring is reasonable to use in the decision to withhold, postpone, or initiate statin therapy in intermediate-risk or selected borderline-risk asymptomatic adults (age 77-75 years and LDL-C >=70 to <190 mg/dL) who do not have diabetes or established atherosclerotic cardiovascular disease (ASCVD).* In intermediate-risk (10-year ASCVD risk >=7.5% to <20%) adults or selected borderline-risk (10-year ASCVD risk >=5% to <7.5%) adults in whom a CAC score is measured for the purpose of making a treatment decision the following recommendations have been made:   If CAC = 0, it is reasonable to withhold statin therapy and reassess in 5 to 10 years, as long as higher risk conditions are absent (diabetes mellitus, family history of premature CHD in first degree relatives (males <55 years; females <65 years), cigarette smoking, LDL >=190 mg/dL or other independent risk factors).   If CAC is 1 to 99, it is reasonable to initiate  statin therapy for patients >=51 years of age.   If CAC is >=100 or >=75th percentile, it is reasonable to initiate statin therapy at any age.   Cardiology referral should be considered for patients with CAC scores =400 or >=75th percentile.   *2018 AHA/ACC/AACVPR/AAPA/ABC/ACPM/ADA/AGS/APhA/ASPC/NLA/PCNA Guideline on the Management of Blood Cholesterol: A Report of the American College of Cardiology/American Heart Association Task Force on Clinical Practice Guidelines. J Am Coll Cardiol. 2019;73(24):3168-3209.   Rudean Haskell, MD   Electronically Signed: By: Rudean Haskell M.D.      Echocardiogram July 11, 2021 IMPRESSIONS     1. Left  ventricular ejection fraction, by estimation, is 60 to 65%. The  left ventricle has normal function. The left ventricle has no regional  wall motion abnormalities. Left ventricular diastolic parameters are  consistent with Grade I diastolic  dysfunction (impaired relaxation).   2. Right ventricular systolic function is normal. The right ventricular  size is normal. There is normal pulmonary artery systolic pressure.   3. The mitral valve is normal in structure. No evidence of mitral valve  regurgitation. No evidence of mitral stenosis.   4. The aortic valve is normal in structure. Aortic valve regurgitation is  not visualized. No aortic stenosis is present.   5. The inferior vena cava is normal in size with greater than 50%  respiratory variability, suggesting right atrial pressure of 3 mmHg.   FINDINGS   Left Ventricle: Left ventricular ejection fraction, by estimation, is 60  to 65%. The left ventricle has normal function. The left ventricle has no  regional wall motion abnormalities. The left ventricular internal cavity  size was normal in size. There is   no left ventricular hypertrophy. Left ventricular diastolic parameters  are consistent with Grade I diastolic dysfunction (impaired relaxation).   Right Ventricle: The right ventricular size is normal. No increase in  right ventricular wall thickness. Right ventricular systolic function is  normal. There is normal pulmonary artery systolic pressure. The tricuspid  regurgitant velocity is 2.67 m/s, and   with an assumed right atrial pressure of 3 mmHg, the estimated right  ventricular systolic pressure is 47.4 mmHg.   Left Atrium: Left atrial size was normal in size.   Right Atrium: Right atrial size was normal in size.   Pericardium: There is no evidence of pericardial effusion.   Mitral Valve: The mitral valve is normal in structure. No evidence of  mitral valve regurgitation. No evidence of mitral valve stenosis.    Tricuspid Valve: The tricuspid valve is normal in structure. Tricuspid  valve regurgitation is not demonstrated. No evidence of tricuspid  stenosis.   Aortic Valve: The aortic valve is normal in structure. Aortic valve  regurgitation is not visualized. No aortic stenosis is present.   Pulmonic Valve: The pulmonic valve was normal in structure. Pulmonic valve  regurgitation is not visualized. No evidence of pulmonic stenosis.   Aorta: The aortic root is normal in size and structure.   Venous: The inferior vena cava is normal in size with greater than 50%  respiratory variability, suggesting right atrial pressure of 3 mmHg.   IAS/Shunts: No atrial level shunt detected by color flow Doppler.      ZIO monitor Patch Wear Time:  5 days and 2 hours starting June 26, 2021. Indication: Palpitation   Patient had a minimal HR of 46 bpm, maximum HR of 193 bpm, and average HR of 75 bpm.   Predominant underlying rhythm was Sinus Rhythm.    [place]  Supraventricular Tachycardia runs occurred, the run with the fastest interval lasting 11 beats with a max rate of 193 bpm, the longest lasting 12 beats with an avg rate of 135 bpm.   Premature atrial complexes were rare (<1.0%). Premature ventricular complexes were rare (<1.0%).   No atrial fibrillation, no ventricular tachycardia, no pauses noted.   Conclusion: This study is remarkable for supraventricular tachycardia which is likely atrial tachycardia with variable block..   Recent Labs: 08/13/2021: BUN 19; Creatinine, Ser 0.69; Magnesium 1.9; Potassium 4.6; Sodium 146  Recent Lipid Panel    Component Value Date/Time   CHOL 286 (H) 12/29/2015 1214   TRIG 177 (H) 12/29/2015 1214   HDL 57 12/29/2015 1214   CHOLHDL 5.0 12/29/2015 1214   VLDL 35 12/29/2015 1214   LDLCALC 194 (H) 12/29/2015 1214   LDLDIRECT 111.0 11/12/2011 1610    Physical Exam:    VS:  BP 138/90 (BP Location: Left Arm, Patient Position: Sitting, Cuff Size: Normal)    Pulse 94   Ht 4\' 10"  (1.473 m)   Wt 150 lb (68 kg)   BMI 31.35 kg/m     Wt Readings from Last 3 Encounters:  08/29/21 150 lb (68 kg)  08/13/21 151 lb 6.4 oz (68.7 kg)  07/09/21 153 lb (69.4 kg)     GEN: Well nourished, well developed in no acute distress HEENT: Normal NECK: No JVD; No carotid bruits LYMPHATICS: No lymphadenopathy CARDIAC: S1S2 noted,RRR, no murmurs, rubs, gallops RESPIRATORY:  Clear to auscultation without rales, wheezing or rhonchi  ABDOMEN: Soft, non-tender, non-distended, +bowel sounds, no guarding. EXTREMITIES: No edema, No cyanosis, no clubbing MUSCULOSKELETAL:  No deformity  SKIN: Warm and dry NEUROLOGIC:  Alert and oriented x 3, non-focal PSYCHIATRIC:  Normal affect, good insight  ASSESSMENT:    1. Essential hypertension   2. TIA (transient ischemic attack)   3. Mild pulmonary hypertension ([place])   4. PSVT (paroxysmal supraventricular tachycardia) ([facility])    PLAN:    Her blood pressure which was manually taken by me during office today is elevated.  138/90 mmHg.  I have advised the patient to increase her carvedilol to 12.5 mg twice daily.  I also expressed to the patient that it is not safe to continue to take antihypertensive medication without checking her blood pressure.  She does have her wrist blood pressure cuff which she is not active.  So today we have giving the patient a new blood pressure cuff which she will take her blood pressure using an arm cuff.  She was very thankful.  I have asked the patient to send me blood pressure readings in 2 weeks so if we need to adjust her antihypertensive medications we can do so.  But I was really able to stressed with her that taking her medication without checking her blood pressure can lead her to an episode that will cause her to be significantly hypotensive and she expresses understanding.  Palpitations has improved.  The patient is in agreement with the above plan. The patient left the office in stable  condition.  The patient will follow up in 3 months or sooner if needed.   Medication Adjustments/Labs and Tests Ordered: Current medicines are reviewed at length with the patient today.  Concerns regarding medicines are outlined above.  No orders of the defined types were placed in this encounter.  Meds ordered this encounter  Medications   carvedilol (COREG) 12.5 MG tablet    Sig: Take 1 tablet (12.5 mg total) by mouth  2 (two) times daily.    Dispense:  180 tablet    Refill:  3    Patient Instructions  Medication Instructions:  Your physician has recommended you make the following change in your medication:  INCREASE: Coreg 12.5 mg twice daily Please take your blood pressure daily and send in a MyChart message in 1 week, please include heart rate.  *If you need a refill on your cardiac medications before your next appointment, please call your pharmacy*   Lab Work: None If you have labs (blood work) drawn today and your tests are completely normal, you will receive your results only by: [place] (if you have MyChart) OR A paper copy in the mail If you have any lab test that is abnormal or we need to change your treatment, we will call you to review the results.   Testing/Procedures: None   Follow-Up: At [facility], you and your health needs are our priority.  As part of our continuing mission to provide you with exceptional heart care, we have created designated Provider Care Teams.  These Care Teams include your primary Cardiologist (physician) and Advanced Practice Providers (APPs -  Physician Assistants and Nurse Practitioners) who all work together to provide you with the care you need, when you need it.  We recommend signing up for the patient portal called "MyChart".  Sign up information is provided on this After Visit Summary.  MyChart is used to connect with patients for Virtual Visits (Telemedicine).  Patients are able to view lab/test results, encounter  notes, upcoming appointments, etc.  Non-urgent messages can be sent to your provider as well.   To learn more about what you can do with MyChart, go to [email or web address].    Your next appointment:   Keep Feb appt  The format for your next appointment:   In Person  Provider:   Berniece Salines, DO     Other Instructions     Adopting a Healthy Lifestyle.  Know what a healthy weight is for you (roughly BMI <25) and aim to maintain this   Aim for 7+ servings of fruits and vegetables daily   65-80+ fluid ounces of water or unsweet tea for healthy kidneys   Limit to max 1 drink of alcohol per day; avoid smoking/tobacco   Limit animal fats in diet for cholesterol and heart health - choose grass fed whenever available   Avoid highly processed foods, and foods high in saturated/trans fats   Aim for low stress - take time to unwind and care for your mental health   Aim for 150 min of moderate intensity exercise weekly for heart health, and weights twice weekly for bone health   Aim for 7-9 hours of sleep daily   When it comes to diets, agreement about the perfect plan isnt easy to find, even among the experts. Experts at the [employer] developed an idea known as the Healthy Eating Plate. Just imagine a plate divided into logical, healthy portions.   The emphasis is on diet quality:   Load up on vegetables and fruits - one-half of your plate: Aim for color and variety, and remember that potatoes dont count.   Go for whole grains - one-quarter of your plate: Whole wheat, barley, wheat berries, quinoa, oats, brown rice, and foods made with them. If you want pasta, go with whole wheat pasta.   Protein power - one-quarter of your plate: Fish, chicken, beans, and nuts are all healthy, versatile protein  sources. Limit red meat.   The diet, however, does go beyond the plate, offering a few other suggestions.   Use healthy plant oils, such as olive, canola, soy,  corn, sunflower and peanut. Check the labels, and avoid partially hydrogenated oil, which have unhealthy trans fats.   If youre thirsty, drink water. Coffee and tea are good in moderation, but skip sugary drinks and limit milk and dairy products to one or two daily servings.   The type of carbohydrate in the diet is more important than the amount. Some sources of carbohydrates, such as vegetables, fruits, whole grains, and beans-are healthier than others.   Finally, stay active  Signed, Berniece Salines, DO  08/29/2021 8:19 PM    [facility]

## 2021-08-29 NOTE — Patient Instructions (Addendum)
Medication Instructions:  Your physician has recommended you make the following change in your medication:  INCREASE: Coreg 12.5 mg twice daily Please take your blood pressure daily and send in a MyChart message in 1 week, please include heart rate.  *If you need a refill on your cardiac medications before your next appointment, please call your pharmacy*   Lab Work: None If you have labs (blood work) drawn today and your tests are completely normal, you will receive your results only by: [place] (if you have MyChart) OR A paper copy in the mail If you have any lab test that is abnormal or we need to change your treatment, we will call you to review the results.   Testing/Procedures: None   Follow-Up: At [facility], you and your health needs are our priority.  As part of our continuing mission to provide you with exceptional heart care, we have created designated Provider Care Teams.  These Care Teams include your primary Cardiologist (physician) and Advanced Practice Providers (APPs -  Physician Assistants and Nurse Practitioners) who all work together to provide you with the care you need, when you need it.  We recommend signing up for the patient portal called "MyChart".  Sign up information is provided on this After Visit Summary.  MyChart is used to connect with patients for Virtual Visits (Telemedicine).  Patients are able to view lab/test results, encounter notes, upcoming appointments, etc.  Non-urgent messages can be sent to your provider as well.   To learn more about what you can do with MyChart, go to [email or web address].    Your next appointment:   Keep Feb appt  The format for your next appointment:   In Person  Provider:   Berniece Salines, DO     Other Instructions

## 2021-08-29 NOTE — Telephone Encounter (Signed)
Called pt back, she is going to come in to discuss medications and symptoms with Dr. Harriet Masson.

## 2021-09-25 ENCOUNTER — Ambulatory Visit: Payer: Medicare Other | Admitting: Cardiology

## 2021-10-03 ENCOUNTER — Ambulatory Visit: Payer: Medicare Other | Admitting: Neurology

## 2021-11-08 ENCOUNTER — Ambulatory Visit: Payer: Medicare Other | Admitting: Cardiology

## 2021-12-11 ENCOUNTER — Other Ambulatory Visit: Payer: Self-pay

## 2021-12-11 ENCOUNTER — Other Ambulatory Visit: Payer: Self-pay | Admitting: Cardiology

## 2021-12-11 ENCOUNTER — Emergency Department (HOSPITAL_COMMUNITY)
Admission: EM | Admit: 2021-12-11 | Discharge: 2021-12-11 | Disposition: A | Discharge status: Home / Self Care | Payer: Medicare Other | Attending: Emergency Medicine | Admitting: Emergency Medicine

## 2021-12-11 ENCOUNTER — Ambulatory Visit: Payer: Medicare Other | Admitting: Cardiology

## 2021-12-11 ENCOUNTER — Encounter (HOSPITAL_COMMUNITY): Payer: Self-pay | Admitting: Oncology

## 2021-12-11 DIAGNOSIS — R0789 Other chest pain: Secondary | ICD-10-CM | POA: Insufficient documentation

## 2021-12-11 DIAGNOSIS — Z79899 Other long term (current) drug therapy: Secondary | ICD-10-CM | POA: Insufficient documentation

## 2021-12-11 DIAGNOSIS — I10 Essential (primary) hypertension: Secondary | ICD-10-CM | POA: Diagnosis not present

## 2021-12-11 DIAGNOSIS — R112 Nausea with vomiting, unspecified: Secondary | ICD-10-CM | POA: Diagnosis present

## 2021-12-11 DIAGNOSIS — R079 Chest pain, unspecified: Secondary | ICD-10-CM

## 2021-12-11 DIAGNOSIS — Z7982 Long term (current) use of aspirin: Secondary | ICD-10-CM | POA: Insufficient documentation

## 2021-12-11 DIAGNOSIS — R197 Diarrhea, unspecified: Secondary | ICD-10-CM | POA: Insufficient documentation

## 2021-12-11 LAB — TROPONIN I (HIGH SENSITIVITY): Troponin I (High Sensitivity): 3 ng/L (ref <18)

## 2021-12-11 LAB — COMPREHENSIVE METABOLIC PANEL
ALT: 13 U/L (ref 0–44)
AST: 20 U/L (ref 15–41)
Albumin: 4.6 g/dL (ref 3.5–5.0)
Alkaline Phosphatase: 76 U/L (ref 38–126)
Anion gap: 11 (ref 5–15)
BUN: 17 mg/dL (ref 8–23)
CO2: 25 mmol/L (ref 22–32)
Calcium: 9.9 mg/dL (ref 8.9–10.3)
Chloride: 100 mmol/L (ref 98–111)
Creatinine, Ser: 0.61 mg/dL (ref 0.44–1.00)
GFR, Estimated: >60 mL/min (ref >60)
Glucose, Bld: 133 mg/dL — ABNORMAL HIGH (ref 70–99)
Potassium: 3.8 mmol/L (ref 3.5–5.1)
Sodium: 136 mmol/L (ref 135–145)
Total Bilirubin: 0.7 mg/dL (ref 0.3–1.2)
Total Protein: 7.9 g/dL (ref 6.5–8.1)

## 2021-12-11 LAB — CBC WITH DIFFERENTIAL/PLATELET
Abs Immature Granulocytes: 0.02 10*3/uL (ref 0.00–0.07)
Basophils Absolute: 0.1 10*3/uL (ref 0.0–0.1)
Basophils Relative: 1 %
Eosinophils Absolute: 0 10*3/uL (ref 0.0–0.5)
Eosinophils Relative: 0 %
HCT: 38.4 % (ref 36.0–46.0)
Hemoglobin: 12.7 g/dL (ref 12.0–15.0)
Immature Granulocytes: 0 %
Lymphocytes Relative: 25 %
Lymphs Abs: 2 10*3/uL (ref 0.7–4.0)
MCH: 31.8 pg (ref 26.0–34.0)
MCHC: 33.1 g/dL (ref 30.0–36.0)
MCV: 96.2 fL (ref 80.0–100.0)
Monocytes Absolute: 0.6 10*3/uL (ref 0.1–1.0)
Monocytes Relative: 7 %
Neutro Abs: 5.4 10*3/uL (ref 1.7–7.7)
Neutrophils Relative %: 67 %
Platelets: 452 10*3/uL — ABNORMAL HIGH (ref 150–400)
RBC: 3.99 MIL/uL (ref 3.87–5.11)
RDW: 13.2 % (ref 11.5–15.5)
WBC: 8 10*3/uL (ref 4.0–10.5)
nRBC: 0 % (ref 0.0–0.2)

## 2021-12-11 LAB — LIPASE, BLOOD: Lipase: 26 U/L (ref 11–51)

## 2021-12-11 MED ORDER — ONDANSETRON HCL 4 MG/2ML IJ SOLN
4.0000 mg | Freq: Once | INTRAMUSCULAR | Status: AC
  Administered 2021-12-11: 4 mg via INTRAVENOUS
  Filled 2021-12-11: qty 2

## 2021-12-11 MED ORDER — ONDANSETRON 4 MG PO TBDP: 4.0000 mg | ORAL_TABLET | Freq: Three times a day (TID) | ORAL | 0 refills | Status: AC | PRN

## 2021-12-11 MED ORDER — SODIUM CHLORIDE 0.9 % IV BOLUS
1000.0000 mL | Freq: Once | INTRAVENOUS | Status: AC
  Administered 2021-12-11: 1000 mL via INTRAVENOUS

## 2021-12-11 NOTE — ED Provider Notes (Signed)
?[facility] DEPT ?Provider Note ? ? ?CSN: [id number] ?Arrival date & time: 12/11/21  1659 ? ?  ? ?History ? ?Chief Complaint  ?Patient presents with  ? Abdominal Pain  ? ? ?Kristin Pope is a 66 y.o. female. ? ?Presents with nausea vomiting diarrhea.  Symptoms ongoing for the past 4 days now.  Describes this as nonbloody nonbilious.  She states she has not been able to tolerate much intake due to her persistent nausea and vomiting.  Triage notation states abdominal pain, however she denies any abdominal pain.  She states has had it intermittently in the last couple of days but currently denies.  She had some chest tightness yesterday as well which is currently gone. ? ? ?  ? ?Home Medications ?Prior to Admission medications   ?Medication Sig Start Date End Date Taking? Authorizing Provider  ?acetaminophen (TYLENOL) 650 MG CR tablet Take 1,300 mg every 8 (eight) hours as needed by mouth for pain.    [provider]  ?amitriptyline (ELAVIL) 50 MG tablet Take 50 mg by mouth at bedtime.    [provider]  ?Armodafinil 150 MG tablet Take 150 mg by mouth 2 (two) times daily as needed (energy boost).    [provider]  ?aspirin 81 MG EC tablet Take 1 tablet by mouth daily. 04/26/21   [provider]  ?carvedilol (COREG) 12.5 MG tablet Take 1 tablet (12.5 mg total) by mouth 2 (two) times daily. 08/29/21 11/27/21  Tobb, Godfrey Pick, DO  ?celecoxib (CELEBREX) 200 MG capsule Take 200 mg by mouth 2 (two) times daily as needed for mild pain.    [provider]  ?cholecalciferol (VITAMIN D) 1000 UNITS tablet Take 1,000 Units daily by mouth.     [provider]  ?clonazePAM (KLONOPIN) 1 MG tablet Take 1 mg by mouth 4 (four) times daily as needed for anxiety.    [provider]  ?cyanocobalamin 100 MCG tablet Take 100 mcg by mouth daily.    [provider]  ?donepezil (ARICEPT) 10 MG tablet Take 1 tablet (10 mg total) by mouth at bedtime.  07/09/21   Kathrynn Ducking, MD  ?DULoxetine (CYMBALTA) 60 MG capsule Take 60 mg by mouth at bedtime.  07/28/15   [provider]  ?escitalopram (LEXAPRO) 10 MG tablet Take 10 mg by mouth every evening. 05/24/21   [provider]  ?ezetimibe (ZETIA) 10 MG tablet Take 1 tablet (10 mg total) by mouth daily. 12/30/15   Rama, Venetia Maxon, MD  ?furosemide (LASIX) 20 MG tablet Take 1 tablet (20 mg total) by mouth daily. 08/13/21   Tobb, [name], DO  ?gabapentin (NEURONTIN) 300 MG capsule Take 300 mg by mouth 3 (three) times daily.    [provider]  ?HYDROmorphone (DILAUDID) 2 MG tablet Take 2 mg by mouth 3 (three) times daily as needed for severe pain.    [provider]  ?lubiprostone (AMITIZA) 24 MCG capsule Take 24 mcg by mouth at bedtime.    [provider]  ?methocarbamol (ROBAXIN) 500 MG tablet Take 500 mg by mouth 4 (four) times daily as needed for muscle spasms. 05/27/21   [provider]  ?montelukast (SINGULAIR) 10 MG tablet Take 10 mg by mouth at bedtime.     [provider]  ?nitrofurantoin, macrocrystal-monohydrate, (MACROBID) 100 MG capsule Take 100 mg at bedtime by mouth. Continuous course 12/07/15   [provider]  ?nitroGLYCERIN (NITROSTAT) 0.4 MG SL tablet Place 1 tablet (0.4 mg total) under  the tongue every 5 (five) minutes as needed. 06/18/21 09/16/21  Tobb, Godfrey Pick, DO  ?omeprazole (PRILOSEC) 40 MG capsule Take 40 mg at bedtime by mouth.     [provider]  ?telmisartan (MICARDIS) 40 MG tablet Take 0.5 tablets (20 mg total) by mouth in the morning and at bedtime. 08/14/21   Tobb, [name], DO  ?tizanidine (ZANAFLEX) 2 MG capsule Take 2 mg by mouth 4 (four) times daily as needed for muscle spasms.    [provider]  ?triamcinolone cream (KENALOG) 0.1 % Apply 1 application topically 2 (two) times daily.    [provider]  ?zolpidem (AMBIEN CR) 12.5 MG CR tablet Take 12.5 mg by mouth at bedtime as needed for  sleep.    [provider]  ?   ? ?Allergies    ?Morphine and related, Aminoglutethimide, Buprenorphine hcl, Fentanyl, Gluten meal, Griseofulvin ultramicrosize [griseofulvin], Hydrocodone, Lactose intolerance (gi), Lipitor [atorvastatin], Monosodium glutamate, Statins, Tetracycline, Tramadol, and Codeine   ? ?Review of Systems   ?Review of Systems  ?Constitutional:  Negative for fever.  ?HENT:  Negative for ear pain.   ?Eyes:  Negative for pain.  ?Respiratory:  Negative for cough.   ?Cardiovascular:  Positive for chest pain.  ?Gastrointestinal:  Positive for abdominal pain.  ?Genitourinary:  Negative for flank pain.  ?Musculoskeletal:  Negative for back pain.  ?Skin:  Negative for rash.  ?Neurological:  Negative for headaches.  ? ?Physical Exam ?Updated Vital Signs ?BP (!) 148/90   Pulse 80   Temp 98.1 ?F (36.7 ?C) (Oral)   Resp 15   Ht '4\' 11"'$  (1.499 m)   Wt 67.6 kg   SpO2 97%   BMI 30.09 kg/m?  ?Physical Exam ?Constitutional:   ?   General: She is not in acute distress. ?   Appearance: Normal appearance.  ?HENT:  ?   Head: Normocephalic.  ?   Nose: Nose normal.  ?Eyes:  ?   Extraocular Movements: Extraocular movements intact.  ?Cardiovascular:  ?   Rate and Rhythm: Normal rate.  ?Pulmonary:  ?   Effort: Pulmonary effort is normal.  ?Abdominal:  ?   Tenderness: There is no abdominal tenderness. There is no guarding or rebound.  ?Musculoskeletal:     ?   General: Normal range of motion.  ?   Cervical back: Normal range of motion.  ?Neurological:  ?   General: No focal deficit present.  ?   Mental Status: She is alert. Mental status is at baseline.  ? ? ?ED Results / Procedures / Treatments   ?Labs ?(all labs ordered are listed, but only abnormal results are displayed) ?Labs Reviewed  ?CBC WITH DIFFERENTIAL/PLATELET - Abnormal; Notable for the following components:  ?    Result Value  ? Platelets 452 (*)   ? All other components within normal limits  ?COMPREHENSIVE METABOLIC PANEL - Abnormal; Notable  for the following components:  ? Glucose, Bld 133 (*)   ? All other components within normal limits  ?LIPASE, BLOOD  ?TROPONIN I (HIGH SENSITIVITY)  ? ? ?EKG ?EKG Interpretation ? ?Date/Time:  Wednesday December 11 2021 17:12:27 EDT ?Ventricular Rate:  61 ?PR Interval:  134 ?QRS Duration: 86 ?QT Interval:  429 ?QTC Calculation: 433 ?R Axis:   47 ?Text Interpretation: Sinus rhythm Borderline T abnormalities, anterior leads Confirmed by Thamas Jaegers ([id number]) on 12/11/2021 5:47:38 PM ? ?Radiology ?No results found. ? ?Procedures ?Procedures  ? ? ?Medications Ordered in ED ?Medications  ?sodium chloride 0.9 % bolus 1,000 mL (  0 mLs Intravenous Stopped 12/11/21 1857)  ?ondansetron [facility]) injection 4 mg (4 mg Intravenous Given 12/11/21 1739)  ? ? ?ED Course/ Medical Decision Making/ A&P ?  ?                        ?Medical Decision Making ?Amount and/or Complexity of Data Reviewed ?Labs: ordered. ? ?Risk ?Prescription drug management. ? ? ?Patient on cardiac monitoring, sinus rhythm. ? ?Review of record shows office visit with primary care doctor and cardiologist September 01, 2021 for hypertension. ? ?Work-up today included labs CBC CMP.  These are unremarkable white count normal chemistry normal.  Troponin is negative, chest pain was yesterday with no additional recurrence of chest pain currently.  She states she gets similar chest pain nearly every day, for the past month, however has not had any today. ? ?EKG shows sinus rhythm.  No ST elevations or depressions, rate is normal. ? ?Serial abdominal exams are done, no tenderness on exam at last exam at 8:10 PM. ?Patient given IV fluid resuscitation and Zofran, subsequently tolerating oral intake.  Recommending outpatient follow-up with her doctor in 2 or 3 days, recommend immediate return for inability keep down any fluids, worsening symptoms, or any additional concerns. ? ? ? ? ? ? ? ?Final Clinical Impression(s) / ED Diagnoses ?Final diagnoses:  ?Nausea vomiting and diarrhea   ?Chest pain, unspecified type  ? ? ?Rx / DC Orders ?ED Discharge Orders   ? ? None  ? ?  ? ? ?  ?Luna Fuse, MD ?12/11/21 2014 ? ?

## 2021-12-11 NOTE — ED Notes (Signed)
Pt ambulated to wheelchair, Patient verbalizes understanding of discharge instructions. Opportunity for questioning and answers were provided. Armband removed by staff, pt discharged from ED. Wheeled out to lobby, awaiting on friend to come pick her up. ? ?

## 2021-12-11 NOTE — ED Triage Notes (Signed)
Pt bib [facility] from home d/t N/V/D that began Sunday.  Pt has poor PO intake, has not been taking HTN medications.  ?

## 2021-12-11 NOTE — Discharge Instructions (Addendum)
Call your primary care doctor or specialist as discussed in the next 2-3 days.   Return immediately back to the ER if:  Your symptoms worsen within the next 12-24 hours. You develop new symptoms such as new fevers, persistent vomiting, new pain, shortness of breath, or new weakness or numbness, or if you have any other concerns.  

## 2021-12-18 ENCOUNTER — Ambulatory Visit: Payer: Medicare Other | Admitting: Neurology

## 2022-01-14 ENCOUNTER — Ambulatory Visit: Payer: Medicare Other | Admitting: Cardiology

## 2022-01-14 ENCOUNTER — Encounter: Payer: Self-pay | Admitting: Cardiology

## 2022-01-14 VITALS — BP 94/58 | HR 66 | Ht <= 58 in | Wt 164.8 lb

## 2022-01-14 DIAGNOSIS — Z79899 Other long term (current) drug therapy: Secondary | ICD-10-CM

## 2022-01-14 DIAGNOSIS — I10 Essential (primary) hypertension: Secondary | ICD-10-CM | POA: Diagnosis not present

## 2022-01-14 NOTE — Progress Notes (Signed)
?Cardiology Office Note:   ? ?Date:  01/16/2022  ? ?ID:  Kristin Pope, DOB 1956-07-22, MRN [id number] ? ?PCP:  Jonathon Bellows, PA-C  ?Cardiologist:  Berniece Salines, DO  ?Electrophysiologist:  None  ? ?Referring MD: Keane Scrape*  ? ?" I am ok" ? ?History of Present Illness:   ? ?Kristin Pope is a 66 y.o. female with a hx of  paroxysmal SVT, hypertension, hyperlipidemia, and prediabetes is here today for follow-up visit.  I saw the patient on June 18, 2021 at that time she had been experiencing intermittent chest discomfort with shortness of breath and palpitations.  Given the significance of her symptoms we repeated an echocardiogram, due to risk factors CAD coronary CTA and placed a monitor on the patient. ?  ?She was seen on August 13, 2021 at that time she did tell me that she had been having some elevated blood pressures.  During that visit we discussed her testing result.  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  She has also been taking her Lasix on a daily basis. ? ?I saw the patient on August 29, 2021 at that time she was concerned that her blood pressure was still not under control.  She has been taking her medication the carvedilol 3 times a day.  I advised the patient that patient take her medication as prescribed.  During that visit her blood pressure was still elevated so I increased her carvedilol to 12.5 mg twice daily and kept her on her telmisartan.  Asked the patient at that time to send me blood pressure readings because we gave her new blood pressure cuff which was working.  Since her last visit she did not follow through with sending me her blood pressure readings.  She tells me she has had some episodes where she had nausea and vomiting and had not taking her medication for couple of days and ended up in [facility] where she was found to be hypertensive.  Per chart review  she was at [facility] on December 11, 2021. ? ?She also tells me that she has been taking extra doses of her medication but unfortunately she is does not take her blood pressure before taking these medications she just feels that her blood pressure is high and then she take the medicines. ? ? ?Past Medical History:  ?Diagnosis Date  ? Abnormal finding on MRI of brain 02/07/2009  ? Qualifier: Diagnosis of  By: Jimmye Norman LPN, Winfield Cunas   ? ALLERGIC RHINITIS 03/29/2007  ? Qualifier: Diagnosis of  By: Tiney Rouge CMA, Ellison Hughs    ? ANEMIA, B12 DEFICIENCY 05/18/2007  ? Qualifier: Diagnosis of  By: Jimmye Norman LPN, Winfield Cunas   ? Anemia, pernicious   ? b12 def.  ? Anxiety   ? Arthritis   ? Atrophic kidney 12/09/2017  ? Biceps tendon tear   ? right  ? BRUXISM 04/13/2009  ? Qualifier: Diagnosis of  By: Arnoldo Morale MD, Balinda Quails   ? 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 Formatting of this note might be different from the original. 03/2018: Chronic; noted MRI 11/2015  ? Cerebrovascular disease or lesion 12/30/2015  ? Cervical radiculopathy 06/16/2017  ? Cervical spondylosis with radiculopathy   ? C2 -- C7  ? Chronic fatigue   ? Chronic pain   ? neck, back  ? Common migraine with intractable migraine 09/01/2018  ?  CONSTIPATION, SLOW TRANSIT 10/04/2010  ? Qualifier: Diagnosis of  By: Arnoldo Morale MD, John E   ? CYSTITIS, CHRONIC INTERSTITIAL 06/27/2009  ? Qualifier: Diagnosis of  By: Arnoldo Morale MD, Balinda Quails   ? DDD (degenerative disc disease), lumbosacral   ? DEGENERATIVE [place] DISEASE, CERVICAL SPINE 05/18/2007  ? Qualifier: Diagnosis of  By: Arnoldo Morale MD, Balinda Quails   ? 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 Depression, major, recurrent ([place])   ? Diverticulosis of colon   ? Dysfunctional uterine bleeding 09/14/2017  ? Formatting of this note might be different from the original. Followed by gynecology  ? Eczema   ? Essential hypertension 09/14/2007  ? Qualifier: Diagnosis of  By: Billy Coast CMA AAMA, Debby    ? Family history of  adverse reaction to anesthesia   ? sister has problems waking up  ? Family history of breast cancer   ? FIBROMYALGIA 03/29/2007  ? Qualifier: Diagnosis of  By: Tiney Rouge CMA, Ellison Hughs    ? Frontal headache 04/16/2018  ? GERD (gastroesophageal reflux disease)   ? Headache   ? constant headaches  ? HIP PAIN, LEFT, CHRONIC 07/04/2008  ? Qualifier: Diagnosis of  By: Arnoldo Morale MD, Balinda Quails   ? History of colonic diverticulitis 08/16/2010  ? w/ perforation (lower GI bleed)--- resolved without surgerical intervention  ? History of TIA (transient ischemic attack) 12/28/2015  ? per MRI - chronic left cerebellar infarct--- no residual  ? Hx of pyelonephritis 09/2005  ? due to UTI  ? Hypercalcemia 11/18/2007  ? Qualifier: Diagnosis of  By: Arnoldo Morale MD, Balinda Quails   ? Hyperlipidemia LDL goal <70 12/30/2015  ? Hypertension   ? Hyponatremia 04/16/2018  ? Insomnia 03/03/2011  ? Irritable bowel syndrome 08/14/2009  ? Qualifier: Diagnosis of  By: Arnoldo Morale MD, Balinda Quails   ? Lipoma of other specified sites 04/13/2009  ? Annotation: over right ankle Qualifier: Diagnosis of  By: Arnoldo Morale MD, [place] PAIN 05/26/2008  ? Qualifier: Diagnosis of  By: Arnoldo Morale MD, Balinda Quails   ? Lumbosacral spondylosis   ? L2-3, L4-5  ? MALAISE AND FATIGUE 07/04/2008  ? Qualifier: Diagnosis of  By: Arnoldo Morale MD, Balinda Quails   ? Memory difficulty 08/11/2019  ? Mild obstructive sleep apnea   ? per study 06/ 2009 mild osa  AHI 13/hr---  recommendation given mouth appliance, loss wt., cpap  ? Mild pulmonary hypertension ([place]) 12/30/2015  ? Mixed hyperlipidemia   ? Muscle weakness (generalized) 12/20/2008  ? Qualifier: Diagnosis of  By: Arnoldo Morale MD, Balinda Quails   ? OA (osteoarthritis)   ? right shoulder AC joint  ? Obese 07/19/2015  ? OSA (obstructive sleep apnea) 03/03/2011  ? NPSG 2009:  AHI 13/hr.  No treatment  ? Osteopenia of multiple sites 01/29/2017  ? Polypharmacy 05/03/2018  ? Postmenopausal bleeding 09/14/2017  ? Formatting of this note might be different from the original.  Followed by gynecology  ? Pre-diabetes   ? Proteinuria 12/23/2007  ? Qualifier: Diagnosis of  By: Arnoldo Morale MD, Balinda Quails   ? Recurrent UTI 12/22/2017  ? Right rotator cuff tear   ? S/P left THA, AA 07/17/2015  ? SYNCOPE 03/13/2009  ? Qualifier: Diagnosis of  By: Arnoldo Morale MD, Balinda Quails   ? Thrombocytosis 05/22/2017  ? Formatting of this note might be different from the original. Overview:  New; mild. Trend at regular f/u.  08/2018: Slight to 419k Formatting of this note might be different from the original. New; mild. Trend at  regular f/u.  ? TIA (transient ischemic attack) 12/28/2015  ? Unspecified hypothyroidism 05/16/2009  ? Qualifier: Diagnosis of  By: Arnoldo Morale MD, Balinda Quails   ? Vitamin B12 deficiency 06/03/2018  ? Vitamin D deficiency 06/03/2018  ? ? ?Past Surgical History:  ?Procedure Laterality Date  ? BREAST SURGERY    ? breast biopsy-benign  ? COLONOSCOPY  last one 08-08-2009  ? DILATATION & CURRETTAGE/HYSTEROSCOPY WITH RESECTOCOPE  02-03-2011   dr Dellis Filbert  [facility]  ? polypectomy  ? RADIAL KERATOTOMY    ? RADIAL OPTIC NEUROTOMY    ? twice in lumbar area of back-every 6 months  ? SHOULDER ARTHROSCOPY WITH ROTATOR CUFF REPAIR Right 08/13/2017  ? Procedure: RIGHT SHOULDER ARTHROSCOPY, DEBRIDEMENT, BICEPS TENOTOMY, ROTATOR CUFF REPAIR, DISTAL CLAVICLE RESECTION;  Surgeon: Sydnee Cabal, MD;  Location: [facility];  Service: Orthopedics;  Laterality: Right;  ? TOTAL HIP ARTHROPLASTY Left 07/17/2015  ? Procedure: LEFT TOTAL HIP ARTHROPLASTY ANTERIOR APPROACH;  Surgeon: Paralee Cancel, MD;  Location: [facility];  Service: Orthopedics;  Laterality: Left;  ? TOTAL HIP ARTHROPLASTY Right 08/21/2015  ? Procedure: RIGHT TOTAL HIP ARTHROPLASTY ANTERIOR APPROACH;  Surgeon: Paralee Cancel, MD;  Location: [facility];  Service: Orthopedics;  Laterality: Right;  ? TRANSTHORACIC ECHOCARDIOGRAM  12/30/2015  ? ef 04-88%, grade 1 diastolic dysfunction/  mild MR/ trivial TR  ? ? ?Current Medications: ?Current Meds  ?Medication Sig  ?  acetaminophen (TYLENOL) 650 MG CR tablet Take 1,300 mg every 8 (eight) hours as needed by mouth for pain.  ? amitriptyline (ELAVIL) 50 MG tablet Take 50 mg by mouth at bedtime.  ? Armodafinil 150 MG tablet Take

## 2022-01-14 NOTE — Patient Instructions (Signed)
Medication Instructions:  ?Your physician recommends that you continue on your current medications as directed. Please refer to the Current Medication list given to you today.  ?*If you need a refill on your cardiac medications before your next appointment, please call your pharmacy* ? ? ?Lab Work: ?Your physician recommends that you return for lab work in:  ?TODAY: BMET, [place], CBC ?If you have labs (blood work) drawn today and your tests are completely normal, you will receive your results only by: ?MyChart Message (if you have MyChart) OR ?A paper copy in the mail ?If you have any lab test that is abnormal or we need to change your treatment, we will call you to review the results. ? ? ?Testing/Procedures: ?None ? ? ?Follow-Up: ?At [facility], you and your health needs are our priority.  As part of our continuing mission to provide you with exceptional heart care, we have created designated Provider Care Teams.  These Care Teams include your primary Cardiologist (physician) and Advanced Practice Providers (APPs -  Physician Assistants and Nurse Practitioners) who all work together to provide you with the care you need, when you need it. ? ?We recommend signing up for the patient portal called "MyChart".  Sign up information is provided on this After Visit Summary.  MyChart is used to connect with patients for Virtual Visits (Telemedicine).  Patients are able to view lab/test results, encounter notes, upcoming appointments, etc.  Non-urgent messages can be sent to your provider as well.   ?To learn more about what you can do with MyChart, go to [email or web address].   ? ?Your next appointment:   ?12 week(s) ? ?The format for your next appointment:   ?In Person ? ?Provider:   ?Berniece Salines, DO   ? ? ?Other Instructions ? ? ?Important Information About Sugar ? ? ? ? ?  ?

## 2022-01-15 LAB — CBC WITH DIFFERENTIAL/PLATELET
Basophils Absolute: 0.1 10*3/uL (ref 0.0–0.2)
Basos: 1 %
EOS (ABSOLUTE): 0.2 10*3/uL (ref 0.0–0.4)
Eos: 2 %
Hematocrit: 31.6 % — ABNORMAL LOW (ref 34.0–46.6)
Hemoglobin: 10.4 g/dL — ABNORMAL LOW (ref 11.1–15.9)
Immature Grans (Abs): 0 10*3/uL (ref 0.0–0.1)
Immature Granulocytes: 0 %
Lymphocytes Absolute: 2.1 10*3/uL (ref 0.7–3.1)
Lymphs: 31 %
MCH: 31.6 pg (ref 26.6–33.0)
MCHC: 32.9 g/dL (ref 31.5–35.7)
MCV: 96 fL (ref 79–97)
Monocytes Absolute: 0.5 10*3/uL (ref 0.1–0.9)
Monocytes: 7 %
Neutrophils Absolute: 4 10*3/uL (ref 1.4–7.0)
Neutrophils: 59 %
Platelets: 419 10*3/uL (ref 150–450)
RBC: 3.29 x10E6/uL — ABNORMAL LOW (ref 3.77–5.28)
RDW: 12.8 % (ref 11.7–15.4)
WBC: 6.7 10*3/uL (ref 3.4–10.8)

## 2022-01-15 LAB — BASIC METABOLIC PANEL WITH GFR
BUN/Creatinine Ratio: 17 (ref 12–28)
BUN: 16 mg/dL (ref 8–27)
CO2: 21 mmol/L (ref 20–29)
Calcium: 9.6 mg/dL (ref 8.7–10.3)
Chloride: 104 mmol/L (ref 96–106)
Creatinine, Ser: 0.94 mg/dL (ref 0.57–1.00)
Glucose: 80 mg/dL (ref 70–99)
Potassium: 5.8 mmol/L (ref 3.5–5.2)
Sodium: 139 mmol/L (ref 134–144)
eGFR: 67 mL/min/1.73

## 2022-01-15 LAB — MAGNESIUM: Magnesium: 2.2 mg/dL (ref 1.6–2.3)

## 2022-01-20 ENCOUNTER — Other Ambulatory Visit: Payer: Self-pay

## 2022-01-20 DIAGNOSIS — Z79899 Other long term (current) drug therapy: Secondary | ICD-10-CM

## 2022-01-24 ENCOUNTER — Other Ambulatory Visit: Payer: Self-pay

## 2022-01-24 DIAGNOSIS — Z79899 Other long term (current) drug therapy: Secondary | ICD-10-CM

## 2022-01-25 LAB — BASIC METABOLIC PANEL
BUN/Creatinine Ratio: 23 (ref 12–28)
BUN: 20 mg/dL (ref 8–27)
CO2: 22 mmol/L (ref 20–29)
Calcium: 9.8 mg/dL (ref 8.7–10.3)
Chloride: 102 mmol/L (ref 96–106)
Creatinine, Ser: 0.86 mg/dL (ref 0.57–1.00)
Glucose: 98 mg/dL (ref 70–99)
Potassium: 5.2 mmol/L (ref 3.5–5.2)
Sodium: 141 mmol/L (ref 134–144)
eGFR: 74 mL/min/{1.73_m2} (ref >59)

## 2022-01-25 LAB — CBC WITH DIFFERENTIAL/PLATELET
Basophils Absolute: 0.1 10*3/uL (ref 0.0–0.2)
Basos: 1 %
EOS (ABSOLUTE): 0.2 10*3/uL (ref 0.0–0.4)
Eos: 3 %
Hematocrit: 31.9 % — ABNORMAL LOW (ref 34.0–46.6)
Hemoglobin: 10.4 g/dL — ABNORMAL LOW (ref 11.1–15.9)
Immature Grans (Abs): 0 10*3/uL (ref 0.0–0.1)
Immature Granulocytes: 0 %
Lymphocytes Absolute: 2.5 10*3/uL (ref 0.7–3.1)
Lymphs: 39 %
MCH: 31.2 pg (ref 26.6–33.0)
MCHC: 32.6 g/dL (ref 31.5–35.7)
MCV: 96 fL (ref 79–97)
Monocytes Absolute: 0.4 10*3/uL (ref 0.1–0.9)
Monocytes: 6 %
Neutrophils Absolute: 3.2 10*3/uL (ref 1.4–7.0)
Neutrophils: 51 %
Platelets: 443 10*3/uL (ref 150–450)
RBC: 3.33 x10E6/uL — ABNORMAL LOW (ref 3.77–5.28)
RDW: 12.7 % (ref 11.7–15.4)
WBC: 6.4 10*3/uL (ref 3.4–10.8)

## 2022-01-25 LAB — MAGNESIUM: Magnesium: 1.8 mg/dL (ref 1.6–2.3)

## 2022-04-25 ENCOUNTER — Ambulatory Visit: Payer: Medicare Other | Admitting: Cardiology

## 2022-06-16 ENCOUNTER — Telehealth: Payer: Self-pay

## 2022-06-16 NOTE — Telephone Encounter (Signed)
Attempted to call pt to move her 10/18 appt to 10/17. No answer, left a detailed message for her to return the call.

## 2022-07-03 ENCOUNTER — Other Ambulatory Visit: Payer: Self-pay | Admitting: Cardiology

## 2022-07-03 NOTE — Telephone Encounter (Signed)
Refill to pharmacy 

## 2022-07-15 ENCOUNTER — Ambulatory Visit: Payer: Medicare Other | Admitting: Cardiology

## 2022-07-15 ENCOUNTER — Ambulatory Visit: Payer: Medicare Other | Admitting: Neurology

## 2022-07-16 ENCOUNTER — Ambulatory Visit: Payer: Medicare Other | Admitting: Cardiology

## 2022-08-07 ENCOUNTER — Ambulatory Visit: Payer: Medicare Other | Admitting: Cardiology

## 2022-08-12 ENCOUNTER — Other Ambulatory Visit: Payer: Self-pay | Admitting: Cardiology

## 2022-08-28 ENCOUNTER — Ambulatory Visit: Payer: Medicare Other | Admitting: Cardiology

## 2022-08-28 ENCOUNTER — Other Ambulatory Visit: Payer: Self-pay | Admitting: *Deleted

## 2022-08-28 NOTE — Telephone Encounter (Signed)
Received Rx request from [facility] for Donepezil 10 mg. Dr Jannifer Franklin has prescribed this for patient. Per Dr Jannifer Franklin, patient to be followed by  [facility]. Pt has appt scheduled for April 2024 with Judson Roch NP (hasn't seen Judson Roch before). Rx refill request sent to Dr Rexene Alberts.

## 2022-09-01 MED ORDER — DONEPEZIL HCL 10 MG PO TABS: 10.0000 mg | ORAL_TABLET | Freq: Every day | ORAL | 0 refills | Status: DC

## 2022-09-11 ENCOUNTER — Other Ambulatory Visit: Payer: Self-pay | Admitting: Cardiology

## 2022-09-11 NOTE — Telephone Encounter (Signed)
Refill to pharmacy 

## 2022-10-07 ENCOUNTER — Ambulatory Visit: Payer: Medicare Other | Admitting: Cardiology

## 2022-10-10 ENCOUNTER — Other Ambulatory Visit: Payer: Self-pay | Admitting: Neurology

## 2022-11-22 ENCOUNTER — Other Ambulatory Visit: Payer: Self-pay | Admitting: Cardiology

## 2022-11-26 ENCOUNTER — Other Ambulatory Visit: Payer: Self-pay | Admitting: Neurology

## 2023-01-13 ENCOUNTER — Ambulatory Visit: Payer: 59 | Admitting: Neurology

## 2023-01-24 ENCOUNTER — Other Ambulatory Visit: Payer: Self-pay | Admitting: Neurology

## 2023-02-18 ENCOUNTER — Telehealth: Payer: Self-pay | Admitting: Cardiology

## 2023-02-18 NOTE — Telephone Encounter (Signed)
   Patient Name: Kristin Pope  DOB: 07-19-1956 MRN: [id number]  Primary Cardiologist: Thomasene Ripple, DO  Chart reviewed as part of pre-operative protocol coverage.   Simple dental extractions (i.e. 1-2 teeth), fillings, and cleanings are considered low risk procedures per guidelines and generally do not require any specific cardiac clearance. It is also generally accepted that for simple extractions and dental cleanings, there is no need to interrupt blood thinner therapy.  SBE prophylaxis is not required for the patient from a cardiac standpoint.  I will route this recommendation to the requesting party via Epic fax function and remove from pre-op pool.  Please call with questions.  Joylene Grapes, NP 02/18/2023, 2:10 PM

## 2023-02-18 NOTE — Telephone Encounter (Signed)
   Pre-operative Risk Assessment    Patient Name: Kristin Pope  DOB: Feb 19, 1956 MRN: [id number]     Request for Surgical Clearance    Procedure:   Filling and SRP  Date of Surgery:  Clearance 02/18/23                                 Surgeon:  Merry Lofty Surgeon's Group or Practice Name:  [facility]  Phone number:  [phone] Fax number:  [phone]   Type of Clearance Requested:   - Medical  - Pharmacy:  Hold TBD  by cardiology   Type of Anesthesia:   lidocaine and septocaine   Additional requests/questions:   Patient is in chair now  Minna Antis   02/18/2023, 2:04 PM

## 2023-02-28 ENCOUNTER — Other Ambulatory Visit: Payer: Self-pay | Admitting: Cardiology

## 2023-02-28 ENCOUNTER — Other Ambulatory Visit: Payer: Self-pay | Admitting: Neurology

## 2023-04-09 ENCOUNTER — Other Ambulatory Visit: Payer: Self-pay | Admitting: Cardiology

## 2023-04-27 ENCOUNTER — Other Ambulatory Visit: Payer: Self-pay | Admitting: Cardiology

## 2023-05-13 ENCOUNTER — Other Ambulatory Visit: Payer: Self-pay | Admitting: Neurology

## 2023-05-13 ENCOUNTER — Other Ambulatory Visit: Payer: Self-pay | Admitting: Cardiology

## 2023-06-09 ENCOUNTER — Other Ambulatory Visit: Payer: Self-pay | Admitting: Cardiology

## 2023-06-10 ENCOUNTER — Other Ambulatory Visit: Payer: Self-pay | Admitting: Cardiology

## 2023-06-15 ENCOUNTER — Telehealth: Payer: Self-pay

## 2023-06-15 ENCOUNTER — Ambulatory Visit: Payer: 59 | Attending: General Practice | Admitting: Cardiology

## 2023-06-15 ENCOUNTER — Encounter: Payer: Self-pay | Admitting: Cardiology

## 2023-06-15 VITALS — BP 130/88 | HR 98 | Ht <= 58 in | Wt 167.2 lb

## 2023-06-15 DIAGNOSIS — I10 Essential (primary) hypertension: Secondary | ICD-10-CM | POA: Diagnosis not present

## 2023-06-15 DIAGNOSIS — Z01810 Encounter for preprocedural cardiovascular examination: Secondary | ICD-10-CM

## 2023-06-15 DIAGNOSIS — G459 Transient cerebral ischemic attack, unspecified: Secondary | ICD-10-CM | POA: Diagnosis not present

## 2023-06-15 DIAGNOSIS — I272 Pulmonary hypertension, unspecified: Secondary | ICD-10-CM | POA: Diagnosis not present

## 2023-06-15 MED ORDER — EZETIMIBE 10 MG PO TABS: 10.0000 mg | ORAL_TABLET | Freq: Every day | ORAL | 10 refills | Status: AC

## 2023-06-15 MED ORDER — TELMISARTAN 40 MG PO TABS: 20.0000 mg | ORAL_TABLET | Freq: Every day | ORAL | 3 refills | Status: DC

## 2023-06-15 MED ORDER — CARVEDILOL 6.25 MG PO TABS: 6.2500 mg | ORAL_TABLET | ORAL | 3 refills | Status: DC | PRN

## 2023-06-15 MED ORDER — FUROSEMIDE 20 MG PO TABS: 20.0000 mg | ORAL_TABLET | ORAL | 3 refills | Status: DC | PRN

## 2023-06-15 NOTE — Telephone Encounter (Signed)
Pre-operative Risk Assessment    Patient Name: Kristin Pope  DOB: Nov 27, 1955 MRN: [id number]      Request for Surgical Clearance    Procedure:   EGD/Colonoscopy  Date of Surgery:  Clearance 07/07/23                                 Surgeon:  Jeani Hawking, MD Surgeon's Group or Practice Name:  [facility] Phone number:  [phone] Fax number:  [phone]   Type of Clearance Requested:   - Medical  - Pharmacy:  Hold Aspirin pt will need instructions on when/if to hold   Type of Anesthesia:   propofol   Additional requests/questions:    Wynetta Fines   06/15/2023, 4:04 PM

## 2023-06-15 NOTE — Patient Instructions (Signed)
Medication Instructions:  Your physician recommends that you continue on your current medications as directed. Please refer to the Current Medication list given to you today.  *If you need a refill on your cardiac medications before your next appointment, please call your pharmacy*   Lab Work: None   Testing/Procedures: None   Follow-Up: At [facility], you and your health needs are our priority.  As part of our continuing mission to provide you with exceptional heart care, we have created designated Provider Care Teams.  These Care Teams include your primary Cardiologist (physician) and Advanced Practice Providers (APPs -  Physician Assistants and Nurse Practitioners) who all work together to provide you with the care you need, when you need it.   Your next appointment:   1 year(s)  Provider:   [name] Tobb, DO   

## 2023-06-15 NOTE — Progress Notes (Deleted)
Cardiology Clinic Note   Patient Name: Kristin Pope Date of Encounter: 06/15/2023  Primary Care Provider:  Bailey Mech, PA-C Primary Cardiologist:  Thomasene Ripple, DO  Patient Profile    ***  Past Medical History    Past Medical History:  Diagnosis Date   Abnormal finding on MRI of brain 02/07/2009   Qualifier: Diagnosis of  By: Mayford Knife, LPN, Domenic Polite    ALLERGIC RHINITIS 03/29/2007   Qualifier: Diagnosis of  By: Briscoe Burns CMA, Lakisha     ANEMIA, B12 DEFICIENCY 05/18/2007   Qualifier: Diagnosis of  By: Mayford Knife, LPN, Bonnye M    Anemia, pernicious    b12 def.   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 03/2018: Chronic; noted MRI 11/2015   Cerebrovascular disease or lesion 12/30/2015   Cervical radiculopathy 06/16/2017   Cervical spondylosis with radiculopathy    C2 -- C7   Chronic fatigue    Chronic pain    neck, back   Common migraine with intractable migraine 09/01/2018   CONSTIPATION, SLOW TRANSIT 10/04/2010   Qualifier: Diagnosis of  By: Lovell Sheehan MD, Balinda Quails    CYSTITIS, CHRONIC INTERSTITIAL 06/27/2009   Qualifier: Diagnosis of  By: Lovell Sheehan MD, Balinda Quails    DDD (degenerative disc disease), lumbosacral    DEGENERATIVE DISC DISEASE, CERVICAL SPINE 05/18/2007   Qualifier: Diagnosis of  By: Lovell Sheehan MD, John E    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 Followed by gynecology   Eczema    Essential hypertension 09/14/2007   Qualifier: Diagnosis of  By: Carole Binning CMA AAMA, Debby     Family history of adverse reaction to anesthesia    sister has  problems waking up   Family history of breast cancer    FIBROMYALGIA 03/29/2007   Qualifier: Diagnosis of  By: Briscoe Burns CMA, Alvy Beal     Frontal headache 04/16/2018   GERD (gastroesophageal reflux disease)    Headache    constant headaches   HIP PAIN, LEFT, CHRONIC 07/04/2008   Qualifier: Diagnosis of  By: Lovell Sheehan MD, John E    History of colonic diverticulitis 08/16/2010   w/ perforation (lower GI bleed)--- resolved without surgerical intervention   History of TIA (transient ischemic attack) 12/28/2015   per MRI - chronic left cerebellar infarct--- no residual   Hx of pyelonephritis 09/2005   due to UTI   Hypercalcemia 11/18/2007   Qualifier: Diagnosis of  By: Lovell Sheehan MD, John E    Hyperlipidemia LDL goal <70 12/30/2015   Hypertension    Hyponatremia 04/16/2018   Insomnia 03/03/2011   Irritable bowel syndrome 08/14/2009   Qualifier: Diagnosis of  By: Lovell Sheehan MD, John E    Lipoma of other specified sites 04/13/2009   Annotation: over right ankle Qualifier: Diagnosis of  By: Lovell Sheehan MD, Balinda Quails    LOW BACK PAIN 05/26/2008   Qualifier: Diagnosis of  By: Lovell Sheehan MD, John E    Lumbosacral spondylosis    L2-3, L4-5   MALAISE AND FATIGUE 07/04/2008   Qualifier: Diagnosis of  By:  Lovell Sheehan MD, Balinda Quails    Memory difficulty 08/11/2019   Mild obstructive sleep apnea    per study 06/ 2009 mild osa  AHI 13/hr---  recommendation given mouth appliance, loss wt., cpap   Mild pulmonary hypertension (HCC) 12/30/2015   Mixed hyperlipidemia    Muscle weakness (generalized) 12/20/2008   Qualifier: Diagnosis of  By: Lovell Sheehan MD, Balinda Quails    OA (osteoarthritis)    right shoulder AC joint   Obese 07/19/2015   OSA (obstructive sleep apnea) 03/03/2011   NPSG 2009:  AHI 13/hr.  No treatment   Osteopenia of multiple sites 01/29/2017   Polypharmacy 05/03/2018   Postmenopausal bleeding 09/14/2017   Formatting of this note might be different from the original. Followed by gynecology   Pre-diabetes     Proteinuria 12/23/2007   Qualifier: Diagnosis of  By: Lovell Sheehan MD, John E    Recurrent UTI 12/22/2017   Right rotator cuff tear    S/P left THA, AA 07/17/2015   SYNCOPE 03/13/2009   Qualifier: Diagnosis of  By: Lovell Sheehan MD, Balinda Quails    Thrombocytosis 05/22/2017   Formatting of this note might be different from the original. Overview:  New; mild. Trend at regular f/u.  08/2018: Slight to 419k Formatting of this note might be different from the original. New; mild. Trend at regular f/u.   TIA (transient ischemic attack) 12/28/2015   Unspecified hypothyroidism 05/16/2009   Qualifier: Diagnosis of  By: Lovell Sheehan MD, John E    Vitamin B12 deficiency 06/03/2018   Vitamin D deficiency 06/03/2018   Past Surgical History:  Procedure Laterality Date   BREAST SURGERY     breast biopsy-benign   COLONOSCOPY  last one 08-08-2009   DILATATION & CURRETTAGE/HYSTEROSCOPY WITH RESECTOCOPE  02-03-2011   dr Seymour Bars  [facility]   polypectomy   RADIAL KERATOTOMY     RADIAL OPTIC NEUROTOMY     twice in lumbar area of back-every 6 months   SHOULDER ARTHROSCOPY WITH ROTATOR CUFF REPAIR Right 08/13/2017   Procedure: RIGHT SHOULDER ARTHROSCOPY, DEBRIDEMENT, BICEPS TENOTOMY, ROTATOR CUFF REPAIR, DISTAL CLAVICLE RESECTION;  Surgeon: Eugenia Mcalpine, MD;  Location: [facility];  Service: Orthopedics;  Laterality: Right;   TOTAL HIP ARTHROPLASTY Left 07/17/2015   Procedure: LEFT TOTAL HIP ARTHROPLASTY ANTERIOR APPROACH;  Surgeon: Durene Romans, MD;  Location: [facility];  Service: Orthopedics;  Laterality: Left;   TOTAL HIP ARTHROPLASTY Right 08/21/2015   Procedure: RIGHT TOTAL HIP ARTHROPLASTY ANTERIOR APPROACH;  Surgeon: Durene Romans, MD;  Location: [facility];  Service: Orthopedics;  Laterality: Right;   TRANSTHORACIC ECHOCARDIOGRAM  12/30/2015   ef 60-65%, grade 1 diastolic dysfunction/  mild MR/ trivial TR    Allergies  Allergies  Allergen Reactions   Morphine And Codeine Rash    Headaches - pt can take  hydromorphone   Aminoglutethimide    Buprenorphine Hcl    Fentanyl Nausea And Vomiting    Fentanyl patch - nausea and vomiting.   Gluten Meal Other (See Comments)    Pt avoids eating gluten   Griseofulvin Ultramicrosize [Griseofulvin]    Hydrocodone Other (See Comments)    Urinary retention---  "can only take 1/2 tablet low dose"   Lactose Intolerance (Gi) Other (See Comments)    Upset stomach   Lipitor [Atorvastatin] Other (See Comments)    Pain, myalgias   Monosodium Glutamate Other (See Comments)    MSG  --- Increases blood pressure   Statins Other (See Comments)    Myalgia   Tetracycline Nausea And  Vomiting   Tramadol Other (See Comments)    "Passed out"   Codeine Rash    REACTION: Insomnia "crazy dreams"    History of Present Illness    ***  Home Medications    Prior to Admission medications   Medication Sig Start Date End Date Taking? Authorizing Provider  acetaminophen (TYLENOL) 650 MG CR tablet Take 1,300 mg every 8 (eight) hours as needed by mouth for pain.    [provider]  amitriptyline (ELAVIL) 50 MG tablet Take 50 mg by mouth at bedtime.    [provider]  Armodafinil 150 MG tablet Take 150 mg by mouth 2 (two) times daily as needed (energy boost).    [provider]  aspirin 81 MG EC tablet Take 1 tablet by mouth daily. Patient not taking: Reported on 01/14/2022 04/26/21   [provider]  carvedilol (COREG) 12.5 MG tablet TAKE 1 TABLET BY MOUTH 2 TIMES DAILY. 08/12/22   Tobb, [name], DO  carvedilol (COREG) 6.25 MG tablet TAKE 1 TABLET BY MOUTH TWICE A DAY 05/13/23   Tobb, [name], DO  celecoxib (CELEBREX) 200 MG capsule Take 200 mg by mouth 2 (two) times daily as needed for mild pain.    [provider]  cholecalciferol (VITAMIN D) 1000 UNITS tablet Take 1,000 Units daily by mouth.     [provider]  clonazePAM (KLONOPIN) 1 MG tablet Take 1 mg by mouth 4 (four) times daily as needed for anxiety.     [provider]  cyanocobalamin 100 MCG tablet Take 100 mcg by mouth daily.    [provider]  donepezil (ARICEPT) 10 MG tablet TAKE 1 TABLET BY MOUTH EVERYDAY AT BEDTIME 05/13/23   Glean Salvo, NP  DULoxetine (CYMBALTA) 60 MG capsule Take 60 mg by mouth at bedtime.  07/28/15   [provider]  escitalopram (LEXAPRO) 10 MG tablet Take 10 mg by mouth every evening. Patient not taking: Reported on 01/14/2022 05/24/21   [provider]  ezetimibe (ZETIA) 10 MG tablet Take 1 tablet (10 mg total) by mouth daily. 12/30/15   Rama, Maryruth Bun, MD  furosemide (LASIX) 20 MG tablet Take 1 tablet (20 mg total) by mouth daily. PLEASE KEEP UPCOMING APPOINTMENT IN ORDER TO RECEIVE FUTURE REFILLS. 06/10/23   Tobb, [name], DO  gabapentin (NEURONTIN) 300 MG capsule Take 300 mg by mouth 3 (three) times daily.    [provider]  HYDROmorphone (DILAUDID) 2 MG tablet Take 2 mg by mouth 3 (three) times daily as needed for severe pain. Patient not taking: Reported on 01/14/2022    [provider]  lubiprostone (AMITIZA) 24 MCG capsule Take 24 mcg by mouth at bedtime.    [provider]  methocarbamol (ROBAXIN) 500 MG tablet Take 500 mg by mouth 4 (four) times daily as needed for muscle spasms. 05/27/21   [provider]  montelukast (SINGULAIR) 10 MG tablet Take 10 mg by mouth at bedtime.     [provider]  nitrofurantoin, macrocrystal-monohydrate, (MACROBID) 100 MG capsule Take 100 mg at bedtime by mouth. Continuous course 12/07/15   [provider]  nitroGLYCERIN (NITROSTAT) 0.4 MG SL tablet Place 1 tablet (0.4 mg total) under the tongue every 5 (five) minutes as needed. 06/18/21 01/14/22  Tobb, [name], DO  omeprazole (PRILOSEC) 40 MG capsule Take 40 mg at bedtime by mouth.     [provider]  ondansetron (ZOFRAN-ODT) 4 MG disintegrating tablet Take 1 tablet (4 mg total) by mouth every  8 (eight) hours as needed for up to 10  doses for nausea or vomiting. 12/11/21   Cheryll Cockayne, MD  telmisartan (MICARDIS) 40 MG tablet TAKE 1/2 TABLETS BY MOUTH IN THE MORNING AND AT BEDTIME. 03/02/23   Tobb, [name], DO  tizanidine (ZANAFLEX) 2 MG capsule Take 2 mg by mouth 4 (four) times daily as needed for muscle spasms.    [provider]  triamcinolone cream (KENALOG) 0.1 % Apply 1 application topically 2 (two) times daily.    [provider]  zolpidem (AMBIEN CR) 12.5 MG CR tablet Take 12.5 mg by mouth at bedtime as needed for sleep.    [provider]    Family History    Family History  Problem Relation Age of Onset   Heart disease Mother    Heart attack Mother    Asthma Mother    Breast cancer Mother 83   Other Father        car accident   Breast cancer Sister 62   Lung cancer Sister    Other Sister        ATM mutation   Heart attack Brother 36   Coronary artery disease Brother    Breast cancer Maternal Grandmother    Rheum arthritis Maternal Grandmother    Stroke Maternal Grandfather    Brain cancer Paternal Grandfather    Lung cancer Paternal Grandfather    Breast cancer Maternal Aunt 3   Alzheimer's disease Paternal Grandmother    Testicular cancer Cousin 33   Other Other        ATM mutation   She indicated that her mother is alive. She indicated that her father is deceased. She indicated that her sister is alive. She indicated that her brother is deceased. She indicated that her maternal grandmother is deceased. She indicated that her maternal grandfather is deceased. She indicated that her paternal grandmother is deceased. She indicated that her paternal grandfather is deceased. She indicated that both of her maternal aunts are alive. She indicated that her maternal uncle is alive. She indicated that her paternal aunt is alive. She indicated that her cousin is alive. She indicated that her other is alive.  Social History    Social History   Socioeconomic History   Marital  status: Divorced    Spouse name: Not on file   Number of children: 0   Years of education: 18   Highest education level: Not on file  Occupational History   Occupation: retired    Associate Professor: UNEMPLOYED  Tobacco Use   Smoking status: Former    Current packs/day: 0.00    Types: Cigarettes    Start date: 09/29/1973    Quit date: 09/29/1984    Years since quitting: 38.7   Smokeless tobacco: Never  Vaping Use   Vaping status: Never Used  Substance and Sexual Activity   Alcohol use: Yes    Comment: wine occassionally   Drug use: No    Comment: hx occasional marjuana (as documented 2017)   Sexual activity: Not on file  Other Topics Concern   Not on file  Social History Narrative   Lives alone   Caffeine use: Drinks coffee sometimes   Right handed    Social Determinants of Health   Financial Resource Strain: Not on file  Food Insecurity: Not on file  Transportation Needs: Not on file  Physical Activity: Not on file  Stress: Not on file  Social Connections: Not on file  Intimate Partner Violence: Not  on file     Review of Systems    General:  No chills, fever, night sweats or weight changes.  Cardiovascular:  No chest pain, dyspnea on exertion, edema, orthopnea, palpitations, paroxysmal nocturnal dyspnea. Dermatological: No rash, lesions/masses Respiratory: No cough, dyspnea Urologic: No hematuria, dysuria Abdominal:   No nausea, vomiting, diarrhea, bright red blood per rectum, melena, or hematemesis Neurologic:  No visual changes, wkns, changes in mental status. All other systems reviewed and are otherwise negative except as noted above.  Physical Exam    VS:  There were no vitals taken for this visit. , BMI There is no height or weight on file to calculate BMI. GEN: Well nourished, well developed, in no acute distress. HEENT: normal. Neck: Supple, no JVD, carotid bruits, or masses. Cardiac: RRR, no murmurs, rubs, or gallops. No clubbing, cyanosis, edema.  Radials/DP/PT  2+ and equal bilaterally.  Respiratory:  Respirations regular and unlabored, clear to auscultation bilaterally. GI: Soft, nontender, nondistended, BS + x 4. MS: no deformity or atrophy. Skin: warm and dry, no rash. Neuro:  Strength and sensation are intact. Psych: Normal affect.  Accessory Clinical Findings    Recent Labs: No results found for requested labs within last 365 days.   Recent Lipid Panel    Component Value Date/Time   CHOL 286 (H) 12/29/2015 1214   TRIG 177 (H) 12/29/2015 1214   HDL 57 12/29/2015 1214   CHOLHDL 5.0 12/29/2015 1214   VLDL 35 12/29/2015 1214   LDLCALC 194 (H) 12/29/2015 1214   LDLDIRECT 111.0 11/12/2011 1610    No BP recorded.  {Refresh Note OR Click here to enter BP  :1}***    ECG personally reviewed by me today- ***          Assessment & Plan   1.  ***   Thomasene Ripple. Shey Yott Pope     06/15/2023, 9:41 AM [facility] [address] Office [phone] Fax [phone]    I spent***minutes examining this patient, reviewing medications, and using patient centered shared decision making involving her cardiac care.  Prior to her visit I spent greater than 20 minutes reviewing her past medical history,  medications, and prior cardiac tests.

## 2023-06-16 ENCOUNTER — Ambulatory Visit: Payer: 59 | Admitting: General Practice

## 2023-06-19 NOTE — Progress Notes (Signed)
Cardiology Office Note:    Date:  06/19/2023   ID:  Kristin Pope, DOB 1956/07/16, MRN [id number]  PCP:  Bailey Mech, PA-C  Cardiologist:  Thomasene Ripple, DO  Electrophysiologist:  None   Referring MD: Bernerd Pho*   " I am ok"  History of Present Illness:    Kristin Pope is a 67 y.o. female with a hx of  paroxysmal SVT, hypertension, hyperlipidemia, and prediabetes is here today for follow-up visit.    Since her last visit she has been doing well. No cardiovascular complaints.   Past Medical History:  Diagnosis Date   Abnormal finding on MRI of brain 02/07/2009   Qualifier: Diagnosis of  By: Mayford Knife, LPN, Domenic Polite    ALLERGIC RHINITIS 03/29/2007   Qualifier: Diagnosis of  By: Briscoe Burns CMA, Lakisha     ANEMIA, B12 DEFICIENCY 05/18/2007   Qualifier: Diagnosis of  By: Mayford Knife, LPN, Bonnye M    Anemia, pernicious    b12 def.   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 03/2018: Chronic; noted MRI 11/2015   Cerebrovascular disease or lesion 12/30/2015   Cervical radiculopathy 06/16/2017   Cervical spondylosis with radiculopathy    C2 -- C7   Chronic fatigue    Chronic pain    neck, back   Common migraine with intractable migraine 09/01/2018   CONSTIPATION, SLOW TRANSIT 10/04/2010   Qualifier: Diagnosis of  By: Lovell Sheehan MD, Balinda Quails    CYSTITIS, CHRONIC INTERSTITIAL 06/27/2009   Qualifier: Diagnosis of  By: Lovell Sheehan MD, Balinda Quails    DDD (degenerative disc disease), lumbosacral    DEGENERATIVE DISC DISEASE, CERVICAL SPINE 05/18/2007   Qualifier: Diagnosis of  By: Lovell Sheehan MD, John E    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 Followed by gynecology   Eczema    Essential hypertension 09/14/2007   Qualifier: Diagnosis of  By: Carole Binning CMA AAMA, Debby     Family history of adverse reaction to anesthesia    sister has problems waking up   Family history of breast cancer    FIBROMYALGIA 03/29/2007   Qualifier: Diagnosis of  By: Briscoe Burns CMA, Alvy Beal     Frontal headache 04/16/2018   GERD (gastroesophageal reflux disease)    Headache    constant headaches   HIP PAIN, LEFT, CHRONIC 07/04/2008   Qualifier: Diagnosis of  By: Lovell Sheehan MD, John E    History of colonic diverticulitis 08/16/2010   w/ perforation (lower GI bleed)--- resolved without surgerical intervention   History of TIA (transient ischemic attack) 12/28/2015   per MRI - chronic left cerebellar infarct--- no residual   Hx of pyelonephritis 09/2005   due to UTI   Hypercalcemia 11/18/2007   Qualifier: Diagnosis of  By: Lovell Sheehan MD, John E    Hyperlipidemia LDL goal <70 12/30/2015   Hypertension    Hyponatremia 04/16/2018   Insomnia 03/03/2011   Irritable bowel syndrome 08/14/2009   Qualifier: Diagnosis of  By: Lovell Sheehan MD, John E    Lipoma of other specified sites 04/13/2009  Annotation: over right ankle Qualifier: Diagnosis of  By: Lovell Sheehan MD, Balinda Quails    LOW BACK PAIN 05/26/2008   Qualifier: Diagnosis of  By: Lovell Sheehan MD, John E    Lumbosacral spondylosis    L2-3, L4-5   MALAISE AND FATIGUE 07/04/2008   Qualifier: Diagnosis of  By: Lovell Sheehan MD, Balinda Quails    Memory difficulty 08/11/2019   Mild obstructive sleep apnea    per study 06/ 2009 mild osa  AHI 13/hr---  recommendation given mouth appliance, loss wt., cpap   Mild pulmonary hypertension (HCC) 12/30/2015   Mixed hyperlipidemia    Muscle weakness (generalized) 12/20/2008   Qualifier: Diagnosis of  By: Lovell Sheehan MD, Balinda Quails    OA (osteoarthritis)    right shoulder AC joint   Obese 07/19/2015   OSA (obstructive  sleep apnea) 03/03/2011   NPSG 2009:  AHI 13/hr.  No treatment   Osteopenia of multiple sites 01/29/2017   Polypharmacy 05/03/2018   Postmenopausal bleeding 09/14/2017   Formatting of this note might be different from the original. Followed by gynecology   Pre-diabetes    Proteinuria 12/23/2007   Qualifier: Diagnosis of  By: Lovell Sheehan MD, John E    Recurrent UTI 12/22/2017   Right rotator cuff tear    S/P left THA, AA 07/17/2015   SYNCOPE 03/13/2009   Qualifier: Diagnosis of  By: Lovell Sheehan MD, Balinda Quails    Thrombocytosis 05/22/2017   Formatting of this note might be different from the original. Overview:  New; mild. Trend at regular f/u.  08/2018: Slight to 419k Formatting of this note might be different from the original. New; mild. Trend at regular f/u.   TIA (transient ischemic attack) 12/28/2015   Unspecified hypothyroidism 05/16/2009   Qualifier: Diagnosis of  By: Lovell Sheehan MD, John E    Vitamin B12 deficiency 06/03/2018   Vitamin D deficiency 06/03/2018    Past Surgical History:  Procedure Laterality Date   BREAST SURGERY     breast biopsy-benign   COLONOSCOPY  last one 08-08-2009   DILATATION & CURRETTAGE/HYSTEROSCOPY WITH RESECTOCOPE  02-03-2011   dr Seymour Bars  [facility]   polypectomy   RADIAL KERATOTOMY     RADIAL OPTIC NEUROTOMY     twice in lumbar area of back-every 6 months   SHOULDER ARTHROSCOPY WITH ROTATOR CUFF REPAIR Right 08/13/2017   Procedure: RIGHT SHOULDER ARTHROSCOPY, DEBRIDEMENT, BICEPS TENOTOMY, ROTATOR CUFF REPAIR, DISTAL CLAVICLE RESECTION;  Surgeon: Eugenia Mcalpine, MD;  Location: [facility];  Service: Orthopedics;  Laterality: Right;   TOTAL HIP ARTHROPLASTY Left 07/17/2015   Procedure: LEFT TOTAL HIP ARTHROPLASTY ANTERIOR APPROACH;  Surgeon: Durene Romans, MD;  Location: [facility];  Service: Orthopedics;  Laterality: Left;   TOTAL HIP ARTHROPLASTY Right 08/21/2015   Procedure: RIGHT TOTAL HIP ARTHROPLASTY ANTERIOR APPROACH;  Surgeon: Durene Romans, MD;   Location: [facility];  Service: Orthopedics;  Laterality: Right;   TRANSTHORACIC ECHOCARDIOGRAM  12/30/2015   ef 60-65%, grade 1 diastolic dysfunction/  mild MR/ trivial TR    Current Medications: Current Meds  Medication Sig   acetaminophen (TYLENOL) 650 MG CR tablet Take 1,300 mg every 8 (eight) hours as needed by mouth for pain.   amitriptyline (ELAVIL) 50 MG tablet Take 50 mg by mouth at bedtime.   Armodafinil 150 MG tablet Take 150 mg by mouth 2 (two) times daily as needed (energy boost).   aspirin 81 MG EC tablet Take 1 tablet by mouth daily.   carvedilol (COREG) 12.5 MG tablet TAKE 1 TABLET BY  MOUTH 2 TIMES DAILY.   celecoxib (CELEBREX) 200 MG capsule Take 200 mg by mouth 2 (two) times daily as needed for mild pain.   cholecalciferol (VITAMIN D) 1000 UNITS tablet Take 1,000 Units daily by mouth.    clonazePAM (KLONOPIN) 1 MG tablet Take 1 mg by mouth 4 (four) times daily as needed for anxiety.   cyanocobalamin 100 MCG tablet Take 100 mcg by mouth daily.   donepezil (ARICEPT) 10 MG tablet TAKE 1 TABLET BY MOUTH EVERYDAY AT BEDTIME   DULoxetine (CYMBALTA) 60 MG capsule Take 60 mg by mouth at bedtime.    escitalopram (LEXAPRO) 10 MG tablet Take 10 mg by mouth every evening.   gabapentin (NEURONTIN) 300 MG capsule Take 300 mg by mouth 3 (three) times daily.   HYDROmorphone (DILAUDID) 2 MG tablet Take 2 mg by mouth 3 (three) times daily as needed for severe pain.   lubiprostone (AMITIZA) 24 MCG capsule Take 24 mcg by mouth at bedtime.   methocarbamol (ROBAXIN) 500 MG tablet Take 500 mg by mouth 4 (four) times daily as needed for muscle spasms.   montelukast (SINGULAIR) 10 MG tablet Take 10 mg by mouth at bedtime.    nitrofurantoin, macrocrystal-monohydrate, (MACROBID) 100 MG capsule Take 100 mg at bedtime by mouth. Continuous course   omeprazole (PRILOSEC) 40 MG capsule Take 40 mg at bedtime by mouth.    ondansetron (ZOFRAN-ODT) 4 MG disintegrating tablet Take 1 tablet (4 mg total) by mouth  every 8 (eight) hours as needed for up to 10 doses for nausea or vomiting.   tizanidine (ZANAFLEX) 2 MG capsule Take 2 mg by mouth 4 (four) times daily as needed for muscle spasms.   triamcinolone cream (KENALOG) 0.1 % Apply 1 application topically 2 (two) times daily.   zolpidem (AMBIEN CR) 12.5 MG CR tablet Take 12.5 mg by mouth at bedtime as needed for sleep.   [DISCONTINUED] carvedilol (COREG) 6.25 MG tablet TAKE 1 TABLET BY MOUTH TWICE A DAY (Patient taking differently: Take 6.25 mg by mouth as needed.)   [DISCONTINUED] ezetimibe (ZETIA) 10 MG tablet Take 1 tablet (10 mg total) by mouth daily.   [DISCONTINUED] furosemide (LASIX) 20 MG tablet Take 1 tablet (20 mg total) by mouth daily. PLEASE KEEP UPCOMING APPOINTMENT IN ORDER TO RECEIVE FUTURE REFILLS. (Patient taking differently: Take 20 mg by mouth as needed. PLEASE KEEP UPCOMING APPOINTMENT IN ORDER TO RECEIVE FUTURE REFILLS.)   [DISCONTINUED] telmisartan (MICARDIS) 40 MG tablet TAKE 1/2 TABLETS BY MOUTH IN THE MORNING AND AT BEDTIME.     Allergies:   Morphine and codeine, Aminoglutethimide, Buprenorphine hcl, Fentanyl, Gluten meal, Griseofulvin ultramicrosize [griseofulvin], Hydrocodone, Lactose intolerance (gi), Lipitor [atorvastatin], Monosodium glutamate, Statins, Tetracycline, Tramadol, and Codeine   Social History   Socioeconomic History   Marital status: Divorced    Spouse name: Not on file   Number of children: 0   Years of education: 18   Highest education level: Not on file  Occupational History   Occupation: retired    Associate Professor: UNEMPLOYED  Tobacco Use   Smoking status: Former    Current packs/day: 0.00    Types: Cigarettes    Start date: 09/29/1973    Quit date: 09/29/1984    Years since quitting: 38.7   Smokeless tobacco: Never  Vaping Use   Vaping status: Never Used  Substance and Sexual Activity   Alcohol use: Yes    Comment: wine occassionally   Drug use: No    Comment: hx occasional marjuana (as documented  2017)   Sexual activity: Not on file  Other Topics Concern   Not on file  Social History Narrative   Lives alone   Caffeine use: Drinks coffee sometimes   Right handed    Social Determinants of Health   Financial Resource Strain: Not on file  Food Insecurity: Not on file  Transportation Needs: Not on file  Physical Activity: Not on file  Stress: Not on file  Social Connections: Not on file     Family History: The patient's family history includes Alzheimer's disease in her paternal grandmother; Asthma in her mother; Brain cancer in her paternal grandfather; Breast cancer in her maternal grandmother; Breast cancer (age of onset: 43) in her maternal aunt; Breast cancer (age of onset: 90) in her sister; Breast cancer (age of onset: 55) in her mother; Coronary artery disease in her brother; Heart attack in her mother; Heart attack (age of onset: 65) in her brother; Heart disease in her mother; Lung cancer in her paternal grandfather and sister; Other in her father, sister, and another family member; Rheum arthritis in her maternal grandmother; Stroke in her maternal grandfather; Testicular cancer (age of onset: 86) in her cousin.  ROS:   Review of Systems  Constitution: Negative for decreased appetite, fever and weight gain.  HENT: Negative for congestion, ear discharge, hoarse voice and sore throat.   Eyes: Negative for discharge, redness, vision loss in right eye and visual halos.  Cardiovascular: Negative for chest pain, dyspnea on exertion, leg swelling, orthopnea and palpitations.  Respiratory: Negative for cough, hemoptysis, shortness of breath and snoring.   Endocrine: Negative for heat intolerance and polyphagia.  Hematologic/Lymphatic: Negative for bleeding problem. Does not bruise/bleed easily.  Skin: Negative for flushing, nail changes, rash and suspicious lesions.  Musculoskeletal: Negative for arthritis, joint pain, muscle cramps, myalgias, neck pain and stiffness.   Gastrointestinal: Negative for abdominal pain, bowel incontinence, diarrhea and excessive appetite.  Genitourinary: Negative for decreased libido, genital sores and incomplete emptying.  Neurological: Negative for brief paralysis, focal weakness, headaches and loss of balance.  Psychiatric/Behavioral: Negative for altered mental status, depression and suicidal ideas.  Allergic/Immunologic: Negative for HIV exposure and persistent infections.    EKGs/Labs/Other Studies Reviewed:    The following studies were reviewed today:   EKG:  The ekg ordered today demonstrates   Coronary CTA July 05, 2021 COMPARISON:  04/10/2020 chest radiograph.  Chest CT of 06/11/2006.   FINDINGS: Vascular: Aortic atherosclerosis. No central pulmonary embolism, on this non-dedicated study.   Mediastinum/Nodes: No imaged thoracic adenopathy.   Lungs/Pleura: No pleural fluid.  Clear imaged lungs.   Upper Abdomen: Normal imaged portions of the liver, spleen, stomach.   Musculoskeletal: No acute osseous abnormality. Mid and lower thoracic spondylosis.   IMPRESSION: 1.  No acute findings in the imaged extracardiac chest. 2.  Aortic Atherosclerosis (ICD10-I70.0).     Electronically Signed   By: Jeronimo Greaves M.D.   On: 07/08/2021 08:14    Addended by Jeronimo Greaves, MD on 07/08/2021  8:16 AM    Study Result   Narrative & Impression  CLINICAL DATA:  67 Year-old White Female   EXAM: Cardiac/Coronary  CTA   TECHNIQUE: The patient was scanned on a [employer].   FINDINGS: Scan was triggered in the descending thoracic aorta. Axial non-contrast 3 mm slices were carried out through the heart. The data set was analyzed on a dedicated work station and scored using the Agatson method. Gantry rotation speed was 250 msecs and collimation  was .6 mm. 0.8 mg of sl NTG was given. The 3D data set was reconstructed in 5% intervals of the 67-82 % of the R-R cycle. Diastolic phases were analyzed on  a dedicated work station using MPR, MIP and VRT modes. The patient received 100 cc of contrast.   Aorta:  Normal size.  No calcifications.  No dissection.   Main Pulmonary Artery: Normal size of the pulmonary artery.   Aortic Valve:  Tri-leaflet.  No calcifications.   Coronary Arteries:  Normal coronary origin.  Right dominance.   Coronary Calcium Score:   Left main: 0   Left anterior descending artery: 0   Left circumflex artery: 0   Right coronary artery: 0   Total: 0   Percentile: 1st for age, sex, and race matched control.   RCA is a large dominant artery that gives rise to PDA and PLA. There is no significant plaque.   Left main is a large artery that gives rise to LAD and LCX arteries. There is no significant plaque.   LAD is a small vessel that gives rise to one large D1 Branch that bifurcates. There is no significant plaque.   LCX is a non-dominant artery that gives rise to one large OM1 branch. There is no significant plaque.   Other findings:   Normal pulmonary vein drainage into the left atrium.   Normal left atrial appendage without a thrombus.   Extra-cardiac findings: See attached radiology report for non-cardiac structures.   IMPRESSION: 1. Coronary calcium score of 0. This was 1st percentile for age, sex, and race matched control.   2. Normal coronary origin with right dominance.   3. CAD-RADS 0. No evidence of CAD (0%). Consider non-atherosclerotic causes of chest pain.   RECOMMENDATIONS:   Coronary artery calcium (CAC) score is a strong predictor of incident coronary heart disease (CHD) and provides predictive information beyond traditional risk factors. CAC scoring is reasonable to use in the decision to withhold, postpone, or initiate statin therapy in intermediate-risk or selected borderline-risk asymptomatic adults (age 65-75 years and LDL-C >=70 to <190 mg/dL) who do not have diabetes or established atherosclerotic cardiovascular  disease (ASCVD).* In intermediate-risk (10-year ASCVD risk >=7.5% to <20%) adults or selected borderline-risk (10-year ASCVD risk >=5% to <7.5%) adults in whom a CAC score is measured for the purpose of making a treatment decision the following recommendations have been made:   If CAC = 0, it is reasonable to withhold statin therapy and reassess in 5 to 10 years, as long as higher risk conditions are absent (diabetes mellitus, family history of premature CHD in first degree relatives (males <55 years; females <65 years), cigarette smoking, LDL >=190 mg/dL or other independent risk factors).   If CAC is 1 to 99, it is reasonable to initiate statin therapy for patients >=55 years of age.   If CAC is >=100 or >=75th percentile, it is reasonable to initiate statin therapy at any age.   Cardiology referral should be considered for patients with CAC scores =400 or >=75th percentile.   *2018 AHA/ACC/AACVPR/AAPA/ABC/ACPM/ADA/AGS/APhA/ASPC/NLA/PCNA Guideline on the Management of Blood Cholesterol: A Report of the American College of Cardiology/American Heart Association Task Force on Clinical Practice Guidelines. J Am Coll Cardiol. 2019;73(24):3168-3209.   Riley Lam, MD   Electronically Signed: By: Riley Lam M.D.      Echocardiogram July 11, 2021 IMPRESSIONS     1. Left ventricular ejection fraction, by estimation, is 60 to 65%. The  left ventricle has normal  function. The left ventricle has no regional  wall motion abnormalities. Left ventricular diastolic parameters are  consistent with Grade I diastolic  dysfunction (impaired relaxation).   2. Right ventricular systolic function is normal. The right ventricular  size is normal. There is normal pulmonary artery systolic pressure.   3. The mitral valve is normal in structure. No evidence of mitral valve  regurgitation. No evidence of mitral stenosis.   4. The aortic valve is normal in structure. Aortic  valve regurgitation is  not visualized. No aortic stenosis is present.   5. The inferior vena cava is normal in size with greater than 50%  respiratory variability, suggesting right atrial pressure of 3 mmHg.   FINDINGS   Left Ventricle: Left ventricular ejection fraction, by estimation, is 60  to 65%. The left ventricle has normal function. The left ventricle has no  regional wall motion abnormalities. The left ventricular internal cavity  size was normal in size. There is   no left ventricular hypertrophy. Left ventricular diastolic parameters  are consistent with Grade I diastolic dysfunction (impaired relaxation).   Right Ventricle: The right ventricular size is normal. No increase in  right ventricular wall thickness. Right ventricular systolic function is  normal. There is normal pulmonary artery systolic pressure. The tricuspid  regurgitant velocity is 2.67 m/s, and   with an assumed right atrial pressure of 3 mmHg, the estimated right  ventricular systolic pressure is 31.5 mmHg.   Left Atrium: Left atrial size was normal in size.   Right Atrium: Right atrial size was normal in size.   Pericardium: There is no evidence of pericardial effusion.   Mitral Valve: The mitral valve is normal in structure. No evidence of  mitral valve regurgitation. No evidence of mitral valve stenosis.   Tricuspid Valve: The tricuspid valve is normal in structure. Tricuspid  valve regurgitation is not demonstrated. No evidence of tricuspid  stenosis.   Aortic Valve: The aortic valve is normal in structure. Aortic valve  regurgitation is not visualized. No aortic stenosis is present.   Pulmonic Valve: The pulmonic valve was normal in structure. Pulmonic valve  regurgitation is not visualized. No evidence of pulmonic stenosis.   Aorta: The aortic root is normal in size and structure.   Venous: The inferior vena cava is normal in size with greater than 50%  respiratory variability, suggesting  right atrial pressure of 3 mmHg.   IAS/Shunts: No atrial level shunt detected by color flow Doppler.      ZIO monitor Patch Wear Time:  5 days and 2 hours starting June 26, 2021. Indication: Palpitation   Patient had a minimal HR of 46 bpm, maximum HR of 193 bpm, and average HR of 75 bpm.   Predominant underlying rhythm was Sinus Rhythm.    26 Supraventricular Tachycardia runs occurred, the run with the fastest interval lasting 11 beats with a max rate of 193 bpm, the longest lasting 12 beats with an avg rate of 135 bpm.   Premature atrial complexes were rare (<1.0%). Premature ventricular complexes were rare (<1.0%).   No atrial fibrillation, no ventricular tachycardia, no pauses noted.   Conclusion: This study is remarkable for supraventricular tachycardia which is likely atrial tachycardia with variable block..       Recent Labs: No results found for requested labs within last 365 days.  Recent Lipid Panel    Component Value Date/Time   CHOL 286 (H) 12/29/2015 1214   TRIG 177 (H) 12/29/2015 1214   HDL 57  12/29/2015 1214   CHOLHDL 5.0 12/29/2015 1214   VLDL 35 12/29/2015 1214   LDLCALC 194 (H) 12/29/2015 1214   LDLDIRECT 111.0 11/12/2011 1610    Physical Exam:    VS:  BP 130/88 (BP Location: Left Arm, Patient Position: Sitting, Cuff Size: Normal)   Pulse 98   Ht 4\' 9"  (1.448 m)   Wt 167 lb 3.2 oz (75.8 kg)   SpO2 96%   BMI 36.18 kg/m     Wt Readings from Last 3 Encounters:  06/15/23 167 lb 3.2 oz (75.8 kg)  01/14/22 164 lb 12.8 oz (74.8 kg)  12/11/21 149 lb (67.6 kg)     GEN: Well nourished, well developed in no acute distress HEENT: Normal NECK: No JVD; No carotid bruits LYMPHATICS: No lymphadenopathy CARDIAC: S1S2 noted,RRR, no murmurs, rubs, gallops RESPIRATORY:  Clear to auscultation without rales, wheezing or rhonchi  ABDOMEN: Soft, non-tender, non-distended, +bowel sounds, no guarding. EXTREMITIES: No edema, No cyanosis, no  clubbing MUSCULOSKELETAL:  No deformity  SKIN: Warm and dry NEUROLOGIC:  Alert and oriented x 3, non-focal PSYCHIATRIC:  Normal affect, good insight  ASSESSMENT:    1. Pre-procedural cardiovascular examination   2. TIA (transient ischemic attack)   3. Essential hypertension   4. Mild pulmonary hypertension (HCC)    PLAN:    She is doing from a CV standpoint.  No medication changes at this time.   The patient does not have any unstable cardiac conditions.  Upon evaluation today, she can achieve 4 METs or greater without anginal symptoms.  According to [facility] and AHA guidelines, she requires no further cardiac workup prior to her noncardiac surgery and should be at acceptable risk.  Our service is available as necessary in the perioperative period..  The patient is in agreement with the above plan. The patient left the office in stable condition.  The patient will follow up in 12 weeks or sooner if needed.   Medication Adjustments/Labs and Tests Ordered: Current medicines are reviewed at length with the patient today.  Concerns regarding medicines are outlined above.  Orders Placed This Encounter  Procedures   EKG 12-Lead   Meds ordered this encounter  Medications   carvedilol (COREG) 6.25 MG tablet    Sig: Take 1 tablet (6.25 mg total) by mouth as needed.    Dispense:  30 tablet    Refill:  3    Appointment needed for further refills. First attempt.   furosemide (LASIX) 20 MG tablet    Sig: Take 1 tablet (20 mg total) by mouth as needed.    Dispense:  30 tablet    Refill:  3   telmisartan (MICARDIS) 40 MG tablet    Sig: Take 0.5 tablets (20 mg total) by mouth daily.    Dispense:  45 tablet    Refill:  3    Patient needs appointment with provider for future refills.   ezetimibe (ZETIA) 10 MG tablet    Sig: Take 1 tablet (10 mg total) by mouth daily.    Dispense:  30 tablet    Refill:  10    Patient Instructions  Medication Instructions:  Your physician recommends that  you continue on your current medications as directed. Please refer to the Current Medication list given to you today.  *If you need a refill on your cardiac medications before your next appointment, please call your pharmacy*   Lab Work: None   Testing/Procedures: None   Follow-Up: At [facility], you and your health  needs are our priority.  As part of our continuing mission to provide you with exceptional heart care, we have created designated Provider Care Teams.  These Care Teams include your primary Cardiologist (physician) and Advanced Practice Providers (APPs -  Physician Assistants and Nurse Practitioners) who all work together to provide you with the care you need, when you need it.   Your next appointment:   1 year(s)  Provider:   Thomasene Ripple, DO     Adopting a Healthy Lifestyle.  Know what a healthy weight is for you (roughly BMI <25) and aim to maintain this   Aim for 7+ servings of fruits and vegetables daily   65-80+ fluid ounces of water or unsweet tea for healthy kidneys   Limit to max 1 drink of alcohol per day; avoid smoking/tobacco   Limit animal fats in diet for cholesterol and heart health - choose grass fed whenever available   Avoid highly processed foods, and foods high in saturated/trans fats   Aim for low stress - take time to unwind and care for your mental health   Aim for 150 min of moderate intensity exercise weekly for heart health, and weights twice weekly for bone health   Aim for 7-9 hours of sleep daily   When it comes to diets, agreement about the perfect plan isnt easy to find, even among the experts. Experts at the [facility] [employer] developed an idea known as the Healthy Eating Plate. Just imagine a plate divided into logical, healthy portions.   The emphasis is on diet quality:   Load up on vegetables and fruits - one-half of your plate: Aim for color and variety, and remember that potatoes dont count.    Go for whole grains - one-quarter of your plate: Whole wheat, barley, wheat berries, quinoa, oats, brown rice, and foods made with them. If you want pasta, go with whole wheat pasta.   Protein power - one-quarter of your plate: Fish, chicken, beans, and nuts are all healthy, versatile protein sources. Limit red meat.   The diet, however, does go beyond the plate, offering a few other suggestions.   Use healthy plant oils, such as olive, canola, soy, corn, sunflower and peanut. Check the labels, and avoid partially hydrogenated oil, which have unhealthy trans fats.   If youre thirsty, drink water. Coffee and tea are good in moderation, but skip sugary drinks and limit milk and dairy products to one or two daily servings.   The type of carbohydrate in the diet is more important than the amount. Some sources of carbohydrates, such as vegetables, fruits, whole grains, and beans-are healthier than others.   Finally, stay active  Signed, Thomasene Ripple, DO  06/19/2023 11:55 PM    [facility]

## 2023-07-16 NOTE — Progress Notes (Signed)
 Internal Medicine at [facility]   ASSESSMENT/PLAN:  1. Injury of head, initial encounter/Fall, initial encounter: -Acute; trip and fall ~1 week ago w/ facial trauma. -I will obtain a CT of the head for further evaluation. -She has an upcoming appt w/ her neurologist next week for routine check-up which she will keep. -Reassuring neuro exam in the office. -She will notify the office of any new/worsening symptoms. - CT Head WO Contrast; Future  3. Need for COVID-19 vaccine: - Moderna Covid-19 12+ yrs (SPIKEVAX)  4. Needs flu shot; given today  Plan: Patient expresses understanding of their current medications and use.  If a new prescription was given today, then I discussed potential side effects, drug interactions, instructions for taking the medication, and the consequences of not taking it. Patient verbalized an understanding of these instructions. Patient is able to verbalize understanding of the care plan discussed today. Patient's medical and personal goals were discussed today. Barriers to current goals:  None Follow up as discussed in prev sched f/u Call sooner if needed.  Chief Complaint  Patient presents with  . Follow-up    SUBJECTIVE:  Ms. Greulich is a 67 y.o. female that presents to clinic today regarding the following issues: htn  Ms. Sundberg presents to f/u chronic medical conditions. She continues preventative cardiology evaluation yearly. She is under care of GI for melena/IDA hx. She is under care of behavioral health for management of anxiety and depression. She is under care of neurology for a history of migraines and memory difficulty. Her weight is up 13 lbs since 01/2023; BMI is 34.2.  Wt Readings from Last 3 Encounters:  07/16/23 77.4 kg (170 lb 11.2 oz)  02/02/23 71.6 kg (157 lb 14.4 oz)  01/06/22 73.5 kg (162 lb 1.6 oz)   She has HTN; BP is borderline controlled. She did not take her antihypertensives today.  BP Readings from Last 3 Encounters:   07/16/23 142/79  02/02/23 114/85  01/06/22 135/90   Her labs show:  Mild prediabetes w/ A1c 5.7% Mild microalbuminuria (88); urine otherwise normal Slight increase in potassium (5.2); mild renal decline w/ GFR 82 and Cr 0.79 Liver studies and sugar are normal LDL moderately above goal (142) Normal wbc, hemoglobin and platelets Thyroid function is normal  She tells me that she was walking 1 week ago when she tripped over her feet. She had injuries to her knees and elbows. More concerning, she hit her face on a doorframe. She has had swelling and bruising of the face in the meantime. She denies loss of consciousness at the time of the injury. She denies nausea, vomiting or blurry vision. She has chronic hx of headache and cognitive changes. I have her taking gabapentin 300 mg TID for management of pain; she only takes this when she will be resting in her bed. She has sedative medications listed on her chart (managed by specialist) which include: elavil, klonopin , robaxin , zanaflex  and ambien .  HISTORY: I have reviewed the patients problem list, current medications, allergies, and social history and updated them as needed.  Past Medical History:  Diagnosis Date  . Cancer (CMD)   . Diabetes (CMD)   . Fatigue   . Fibromyalgia   . Hypertension   . Prediabetes 04/19/2020   Family History  Problem Relation Name Age of Onset  . Asthma Mother    . Breast cancer Mother    . Hypertension Mother    . Heart disease Mother    . Hyperlipidemia Sister    .  Heart disease Brother    . Alzheimer's disease Maternal Grandmother    . Stroke Maternal Grandfather      Past Surgical History:  Procedure Laterality Date  . CERVICAL POLYPECTOMY     Procedure: CERVICAL POLYPECTOMY  . DENTAL SURGERY      Procedure: DENTAL SURGERY  . TOTAL HIP ARTHROPLASTY     Procedure: TOTAL HIP REPLACEMENT    Current Outpatient Medications  Medication Sig Dispense Refill  . acetaminophen  (TYLENOL ) 650 mg ER  tablet Take 1,300 mg by mouth every 8 (eight) hours as needed (pain). 90 tablet 1  . amitriptyline (ELAVIL) 50 mg tablet amitriptyline 50 mg tablet TAKE 1 TABLET BY MOUTH EVERYDAY AT BEDTIME    . armodafiniL  (NUVIGIL ) 150 mg tablet Take 150 mg by mouth daily as needed. prn    . aspirin  81 mg EC tablet Take 81 mg by mouth daily. prn 30 tablet 11  . carvediloL  (COREG ) 12.5 mg tablet Take 1 tablet (12.5 mg total) by mouth in the morning and 1 tablet (12.5 mg total) in the evening. Take with meals. 180 tablet 3  . celecoxib  (CeleBREX ) 200 mg capsule Take 200 mg by mouth daily as needed (pain). prn  3  . clonazePAM  (KlonoPIN ) 1 mg tablet Take 1 mg by mouth 4 (four) times a day as needed.    . donepeziL  (ARICEPT ) 10 mg tablet donepezil  10 mg tablet TAKE 1 TABLET BY MOUTH EVERYDAY AT BEDTIME    . DULoxetine  (CYMBALTA ) 60 mg capsule Take 60 mg by mouth Once Daily. 30 capsule 5  . escitalopram (LEXAPRO) 10 mg tablet Take 10 mg by mouth every evening.    . ezetimibe  (ZETIA ) 10 mg tablet TAKE 1 TABLET (10 MG TOTAL) BY MOUTH DAILY. PATIENT MUST HAVE OFFICE VISIT BEFORE NEXT REFILL. 90 tablet 1  . furosemide  (LASIX ) 20 mg tablet TAKE 1 TABLET BY MOUTH EVERY DAY (Patient taking differently: prn) 90 tablet 1  . gabapentin (NEURONTIN) 300 mg capsule TAKE 1 CAPSULE BY MOUTH THREE TIMES A DAY 90 capsule 3  . loperamide (IMODIUM) 2 mg capsule TAKE 1 CAPSULES BY MOUTH 3 TIMES A DAY AS NEEDED FOR 10 DAYS FOR DIARRHEA 30 capsule 0  . methocarbamoL  (ROBAXIN ) 500 mg tablet Take 1 tablet by mouth 4 (four) times a day before meals and nightly.    . montelukast  (SINGULAIR ) 10 mg tablet TAKE 1 TABLET BY MOUTH DAILY AT NIGHT 90 tablet 1  . multivitamin cap Take 1 capsule by mouth daily.    . nitrofurantoin , macrocrystal-monohydrate, (MACROBID ) 100 mg capsule Take 1 capsule (100 mg total) by mouth daily. 30 capsule 5  . omeprazole  (PriLOSEC) 40 mg DR capsule TAKE 1 CAPSULE BY MOUTH EVERY DAY 90 capsule 0  . omeprazole   (PriLOSEC) 40 mg DR capsule Take 1 capsule (40 mg total) by mouth daily. 90 capsule 1  . telmisartan  (MICARDIS ) 40 mg tab tablet Take 20 mg by mouth 2 (two) times a day. 90 tablet 3  . tiZANidine  (ZANAFLEX ) 2 mg capsule Take 2 mg by mouth 4 (four) times a day as needed for muscle spasms.    . zolpidem  (AMBIEN  CR) 12.5 mg ER tablet Take 12.5 mg by mouth nightly.     No current facility-administered medications for this visit.    Ms. Calaway  reports that she has quit smoking. She has never used smokeless tobacco.  ROS: The patient denies any fevers, chills, night sweats, blurred vision, sore throat, coughing, shortness of breath, sputum production, chest pain,  abdominal pain, nausea/vomiting/diarrhea, blood in the stool, dysuria, blood in urine, or leg swelling.   Review of Systems - All other systems reviewed are negative except as noted above.  OBJECTIVE:  Ms. Pizzi  height is 1.448 m (4' 9) and weight is 77.4 kg (170 lb 11.2 oz). Her blood pressure is 142/79 and her pulse is 74. Her oxygen saturation is 97%.   PHYSICAL:  General Appearance: Elderly white female w BMI 36.9. Alert and appears in no acute distress. Sitting comfortably in chair. Head: Normocephalic.  Eyes: Pupils equal, round, reactive to light. EOM intact in all directions. HENT: Oral mucosa moist without erythema or exudates. Erythema posterior oropharynx. Hearing intact. Respiratory: Good air movement throughout. No accessory muscle use, increased work of breathing or respiratory distress. Clear to auscultation in all lung fields. Normal effort of breathing, non-labored. No adventitious lung sounds including wheezes, rhochi or crackles. Cardiovascular: Regular rate and rhythm Musculoskeletal: Active ROM of bilateral upper and lower extremities.  Skin: Warm, dry. She has racoon bruising of the face/nose/forehead. Psychiatric: Pleasant. Mood and affect normal. Not anxious or tearful. Neurological: Alert. Motor  strength normal and symmetric of bilateral upper and lower extremities. Cranial nerves 2-12 and sensation grossly intact.  Rosalva Catchings, PA-C Internal Medicine at [facility] 07/16/2023 2:13 PM

## 2023-07-20 NOTE — Progress Notes (Unsigned)
Patient: Kristin Pope Date of Birth: 1956/03/02  Reason for Visit: Follow up History from: Patient Primary Neurologist: Kristin Pope/Kristin Pope for sleep in 2021  ASSESSMENT AND PLAN 67 y.o. year old female   Mild memory issues Mood disorder Polypharmacy treatment Chronic fatigue Chronic pain, fibromyalgia Recent fall, CT head no acute abnormality, mild left forehead swelling  -Dr. Frances Pope would recommend she follow-up with her primary care provider regarding her sleep issues.  They can place another referral if needed.  Seems her mood, polypharmacy treatment are at the forefront of her sleep issues. -For knot to left forehead, would recommend ice, is getting smaller overtime -MMSE 30/30 today, wishes to remain on Aricept 10 mg daily, feels has done well to keep her memory stable. MMSE was 30/30 in 2019. -Can continue close follow-up with PCP, refills of Aricept can come from PCP going forward, return here for new issues   HISTORY OF PRESENT ILLNESS: Today 07/21/23 She had a fall 2 weeks ago, she tripped, hit her head against the door. She didn't pass out.  CT scan, looked fine (below). Is very inactive, spends most  of the day in the bed. No social life, chronic pain from fibro, needs right shoulder joint replacement, torn right bicep, left bicep has started hurting, knees hurt, has degenerative disc disease bone spurs in her back. Takes gabapentin, Celebrex. Has remained on Aricept, has been on for 6 years. She thinks it helps her memory. Takes as preventative since AD in both sides of family. MMSE 30/30. Lives alone, drives, manages her own affairs. Admits her home is like a hoarder situation, she doesn't keep up with cleaning. She doesn't feel like managing the home, because she is in pain all the time and tired. She never had a sleep study when Dr. Frances Pope ordered in Nov 2021. She would consider wearing CPAP if indicated. Takes Ambien and Amitriptyline at night. Still doesn't sleep well at night.  Taking coreg now, no longer waking up with heart racing at night. At her house no TV or internet, right now staying with her mom who has AD. Sleeps from 3 AM-7 PM or laying in the bed, has been sleeping 16 hours for 30+ years. Takes Klonopin up to 4 times daily, but takes 1/2 tablet 8 times daily for anxiety.  ESS 14. Has small knot to left forehead above eyebrow, is getting smaller.   Impression  1. No acute intracranial pathology. 2. Mild left forehead swelling without underlying calvarial fracture.  HISTORY  07/09/21 Dr. Anne Pope: Kristin Pope is a 67 year old right-handed white female with a history of fibromyalgia and chronic fatigue.  She indicates that she remains very inactive, she spends much of her day in bed.  She has been having some headaches but the headaches are not severe, Tylenol will help some.  She continues to have some mild memory issues, mainly having troubles with numbers, she indicates that she can remember names for people fairly well.  She has not given up any activities of daily living since last seen, she functions independently.  She has had some recent issues with chest discomfort and she has been waking up with increased heart rate.  She has been undergoing a cardiac evaluation for this.  She was to be set up for a sleep evaluation but she never had this done.  She has been seen through Dr. Frances Pope previously.   REVIEW OF SYSTEMS: Out of a complete 14 system review of symptoms, the patient complains only of the following symptoms,  and all other reviewed systems are negative.  See HPI  ALLERGIES: Allergies  Allergen Reactions   Morphine And Codeine Rash    Headaches - pt can take hydromorphone   Aminoglutethimide    Buprenorphine Hcl    Fentanyl Nausea And Vomiting    Fentanyl patch - nausea and vomiting.   Gluten Meal Other (See Comments)    Pt avoids eating gluten   Griseofulvin Ultramicrosize [Griseofulvin]    Hydrocodone Other (See Comments)    Urinary  retention---  "can only take 1/2 tablet low dose"   Lactose Intolerance (Gi) Other (See Comments)    Upset stomach   Lipitor [Atorvastatin] Other (See Comments)    Pain, myalgias   Monosodium Glutamate Other (See Comments)    MSG  --- Increases blood pressure   Statins Other (See Comments)    Myalgia   Tetracycline Nausea And Vomiting   Tramadol Other (See Comments)    "Passed out"   Codeine Rash    REACTION: Insomnia "crazy dreams"    HOME MEDICATIONS: Outpatient Medications Prior to Visit  Medication Sig Dispense Refill   acetaminophen (TYLENOL) 650 MG CR tablet Take 1,300 mg every 8 (eight) hours as needed by mouth for pain.     amitriptyline (ELAVIL) 50 MG tablet Take 50 mg by mouth at bedtime.     Armodafinil 150 MG tablet Take 150 mg by mouth 2 (two) times daily as needed (energy boost).     aspirin 81 MG EC tablet Take 1 tablet by mouth daily.     carvedilol (COREG) 12.5 MG tablet TAKE 1 TABLET BY MOUTH 2 TIMES DAILY. 180 tablet 3   carvedilol (COREG) 6.25 MG tablet Take 1 tablet (6.25 mg total) by mouth as needed. 30 tablet 3   celecoxib (CELEBREX) 200 MG capsule Take 200 mg by mouth 2 (two) times daily as needed for mild pain.     cholecalciferol (VITAMIN D) 1000 UNITS tablet Take 1,000 Units daily by mouth.      clonazePAM (KLONOPIN) 1 MG tablet Take 1 mg by mouth 4 (four) times daily as needed for anxiety.     cyanocobalamin 100 MCG tablet Take 100 mcg by mouth daily.     donepezil (ARICEPT) 10 MG tablet TAKE 1 TABLET BY MOUTH EVERYDAY AT BEDTIME 90 tablet 0   DULoxetine (CYMBALTA) 60 MG capsule Take 60 mg by mouth at bedtime.   5   escitalopram (LEXAPRO) 10 MG tablet Take 10 mg by mouth every evening.     ezetimibe (ZETIA) 10 MG tablet Take 1 tablet (10 mg total) by mouth daily. 30 tablet 10   furosemide (LASIX) 20 MG tablet Take 1 tablet (20 mg total) by mouth as needed. 30 tablet 3   gabapentin (NEURONTIN) 300 MG capsule Take 300 mg by mouth 3 (three) times daily.      HYDROmorphone (DILAUDID) 2 MG tablet Take 2 mg by mouth 3 (three) times daily as needed for severe pain.     lubiprostone (AMITIZA) 24 MCG capsule Take 24 mcg by mouth at bedtime.     methocarbamol (ROBAXIN) 500 MG tablet Take 500 mg by mouth 4 (four) times daily as needed for muscle spasms.     montelukast (SINGULAIR) 10 MG tablet Take 10 mg by mouth at bedtime.      nitrofurantoin, macrocrystal-monohydrate, (MACROBID) 100 MG capsule Take 100 mg at bedtime by mouth. Continuous course  5   omeprazole (PRILOSEC) 40 MG capsule Take 40 mg at bedtime by mouth.  ondansetron (ZOFRAN-ODT) 4 MG disintegrating tablet Take 1 tablet (4 mg total) by mouth every 8 (eight) hours as needed for up to 10 doses for nausea or vomiting. 10 tablet 0   telmisartan (MICARDIS) 40 MG tablet Take 0.5 tablets (20 mg total) by mouth daily. 45 tablet 3   tizanidine (ZANAFLEX) 2 MG capsule Take 2 mg by mouth 4 (four) times daily as needed for muscle spasms.     triamcinolone cream (KENALOG) 0.1 % Apply 1 application topically 2 (two) times daily.     zolpidem (AMBIEN CR) 12.5 MG CR tablet Take 12.5 mg by mouth at bedtime as needed for sleep.     nitroGLYCERIN (NITROSTAT) 0.4 MG SL tablet Place 1 tablet (0.4 mg total) under the tongue every 5 (five) minutes as needed. 30 tablet 3   No facility-administered medications prior to visit.    PAST MEDICAL HISTORY: Past Medical History:  Diagnosis Date   Abnormal finding on MRI of brain 02/07/2009   Qualifier: Diagnosis of  By: Mayford Knife, LPN, Domenic Polite    ALLERGIC RHINITIS 03/29/2007   Qualifier: Diagnosis of  By: Briscoe Burns CMA, Lakisha     ANEMIA, B12 DEFICIENCY 05/18/2007   Qualifier: Diagnosis of  By: Mayford Knife, LPN, Bonnye M    Anemia, pernicious    b12 def.   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 03/2018: Chronic; noted MRI 11/2015   Cerebrovascular disease or lesion 12/30/2015   Cervical radiculopathy 06/16/2017   Cervical spondylosis with radiculopathy    C2 -- C7   Chronic fatigue    Chronic pain    neck, back   Common migraine with intractable migraine 09/01/2018   CONSTIPATION, SLOW TRANSIT 10/04/2010   Qualifier: Diagnosis of  By: Lovell Sheehan MD, Balinda Quails    CYSTITIS, CHRONIC INTERSTITIAL 06/27/2009   Qualifier: Diagnosis of  By: Lovell Sheehan MD, Balinda Quails    DDD (degenerative disc disease), lumbosacral    DEGENERATIVE DISC DISEASE, CERVICAL SPINE 05/18/2007   Qualifier: Diagnosis of  By: Lovell Sheehan MD, John E    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 Followed by gynecology   Eczema    Essential hypertension 09/14/2007   Qualifier: Diagnosis of  By: Carole Binning CMA AAMA, Debby     Family history of adverse reaction to anesthesia    sister has problems waking up   Family history of breast cancer    FIBROMYALGIA 03/29/2007   Qualifier: Diagnosis of  By: Briscoe Burns CMA, Alvy Beal     Frontal headache 04/16/2018   GERD (gastroesophageal reflux disease)    Headache    constant headaches   HIP PAIN, LEFT, CHRONIC 07/04/2008   Qualifier: Diagnosis of  By: Lovell Sheehan MD, Balinda Quails    History of colonic diverticulitis 08/16/2010   w/ perforation (lower GI bleed)--- resolved without surgerical intervention   History of TIA (transient ischemic attack) 12/28/2015   per MRI - chronic left cerebellar infarct--- no residual   Hx of pyelonephritis 09/2005   due to UTI   Hypercalcemia 11/18/2007   Qualifier: Diagnosis  of  By: Lovell Sheehan MD, John E    Hyperlipidemia LDL goal <70 12/30/2015   Hypertension    Hyponatremia 04/16/2018   Insomnia 03/03/2011    Irritable bowel syndrome 08/14/2009   Qualifier: Diagnosis of  By: Lovell Sheehan MD, John E    Lipoma of other specified sites 04/13/2009   Annotation: over right ankle Qualifier: Diagnosis of  By: Lovell Sheehan MD, Balinda Quails    LOW BACK PAIN 05/26/2008   Qualifier: Diagnosis of  By: Lovell Sheehan MD, John E    Lumbosacral spondylosis    L2-3, L4-5   MALAISE AND FATIGUE 07/04/2008   Qualifier: Diagnosis of  By: Lovell Sheehan MD, Balinda Quails    Memory difficulty 08/11/2019   Mild obstructive sleep apnea    per study 06/ 2009 mild osa  AHI 13/hr---  recommendation given mouth appliance, loss wt., cpap   Mild pulmonary hypertension (HCC) 12/30/2015   Mixed hyperlipidemia    Muscle weakness (generalized) 12/20/2008   Qualifier: Diagnosis of  By: Lovell Sheehan MD, Balinda Quails    OA (osteoarthritis)    right shoulder AC joint   Obese 07/19/2015   OSA (obstructive sleep apnea) 03/03/2011   NPSG 2009:  AHI 13/hr.  No treatment   Osteopenia of multiple sites 01/29/2017   Polypharmacy 05/03/2018   Postmenopausal bleeding 09/14/2017   Formatting of this note might be different from the original. Followed by gynecology   Pre-diabetes    Proteinuria 12/23/2007   Qualifier: Diagnosis of  By: Lovell Sheehan MD, John E    Recurrent UTI 12/22/2017   Right rotator cuff tear    S/P left THA, AA 07/17/2015   SYNCOPE 03/13/2009   Qualifier: Diagnosis of  By: Lovell Sheehan MD, Balinda Quails    Thrombocytosis 05/22/2017   Formatting of this note might be different from the original. Overview:  New; mild. Trend at regular f/u.  08/2018: Slight to 419k Formatting of this note might be different from the original. New; mild. Trend at regular f/u.   TIA (transient ischemic attack) 12/28/2015   Unspecified hypothyroidism 05/16/2009   Qualifier: Diagnosis of  By: Lovell Sheehan MD, John E    Vitamin B12 deficiency 06/03/2018   Vitamin D deficiency 06/03/2018    PAST SURGICAL HISTORY: Past Surgical History:  Procedure Laterality Date   BREAST SURGERY     breast  biopsy-benign   COLONOSCOPY  last one 08-08-2009   DILATATION & CURRETTAGE/HYSTEROSCOPY WITH RESECTOCOPE  02-03-2011   dr Seymour Bars  [facility]   polypectomy   RADIAL KERATOTOMY     RADIAL OPTIC NEUROTOMY     twice in lumbar area of back-every 6 months   SHOULDER ARTHROSCOPY WITH ROTATOR CUFF REPAIR Right 08/13/2017   Procedure: RIGHT SHOULDER ARTHROSCOPY, DEBRIDEMENT, BICEPS TENOTOMY, ROTATOR CUFF REPAIR, DISTAL CLAVICLE RESECTION;  Surgeon: Eugenia Mcalpine, MD;  Location: [facility];  Service: Orthopedics;  Laterality: Right;   TOTAL HIP ARTHROPLASTY Left 07/17/2015   Procedure: LEFT TOTAL HIP ARTHROPLASTY ANTERIOR APPROACH;  Surgeon: Durene Romans, MD;  Location: [facility];  Service: Orthopedics;  Laterality: Left;   TOTAL HIP ARTHROPLASTY Right 08/21/2015   Procedure: RIGHT TOTAL HIP ARTHROPLASTY ANTERIOR APPROACH;  Surgeon: Durene Romans, MD;  Location: [facility];  Service: Orthopedics;  Laterality: Right;   TRANSTHORACIC ECHOCARDIOGRAM  12/30/2015   ef 60-65%, grade 1 diastolic dysfunction/  mild MR/ trivial TR    FAMILY HISTORY: Family History  Problem Relation Age of Onset   Heart disease Mother    Heart attack Mother    Asthma Mother  Breast cancer Mother 72   Other Father        car accident   Breast cancer Sister 43   Lung cancer Sister    Other Sister        ATM mutation   Heart attack Brother 36   Coronary artery disease Brother    Breast cancer Maternal Grandmother    Rheum arthritis Maternal Grandmother    Stroke Maternal Grandfather    Brain cancer Paternal Grandfather    Lung cancer Paternal Grandfather    Breast cancer Maternal Aunt 41   Alzheimer's disease Paternal Grandmother    Testicular cancer Cousin 82   Other Other        ATM mutation    SOCIAL HISTORY: Social History   Socioeconomic History   Marital status: Divorced    Spouse name: Not on file   Number of children: 0   Years of education: 18   Highest education level: Not on file   Occupational History   Occupation: retired    Associate Professor: UNEMPLOYED  Tobacco Use   Smoking status: Former    Current packs/day: 0.00    Types: Cigarettes    Start date: 09/29/1973    Quit date: 09/29/1984    Years since quitting: 38.8   Smokeless tobacco: Never  Vaping Use   Vaping status: Never Used  Substance and Sexual Activity   Alcohol use: Yes    Comment: wine occassionally   Drug use: No    Comment: hx occasional marjuana (as documented 2017)   Sexual activity: Not on file  Other Topics Concern   Not on file  Social History Narrative   Lives alone   Caffeine use: Drinks coffee sometimes   Right handed    Social Determinants of Health   Financial Resource Strain: Not on file  Food Insecurity: Not on file  Transportation Needs: Not on file  Physical Activity: Not on file  Stress: Not on file  Social Connections: Not on file  Intimate Partner Violence: Not on file    PHYSICAL EXAM  Vitals:   07/21/23 1352  BP: 124/79  Pulse: 89  Weight: 172 lb (78 kg)  Height: 4\' 9"  (1.448 m)   Body mass index is 37.22 kg/m.    07/21/2023    1:53 PM 07/09/2021    2:22 PM 07/05/2020    3:00 PM  MMSE - Mini Mental State Exam  Orientation to time 5 5 5   Orientation to Place 5 5 5   Registration 3 3 3   Attention/ Calculation 5 5 5   Recall 3 3 3   Language- name 2 objects 2 2 2   Language- repeat 1 1 1   Language- follow 3 step command 3 3 3   Language- read & follow direction 1 1 1   Write a sentence 1 1 1   Copy design 1 1 1   Total score 30 30 30    Generalized: Well developed, in no acute distress, tired looking female Neurological examination  Mentation: Alert oriented to time, place, history taking. Follows all commands speech and language fluent Cranial nerve II-XII: Pupils were equal round reactive to light. Extraocular movements were full, visual field were full on confrontational test. Facial sensation and strength were normal. Head turning and shoulder shrug  were  normal and symmetric. Motor: The motor testing reveals 5 over 5 strength of all 4 extremities. Good symmetric motor tone is noted throughout.  Sensory: Sensory testing is intact to soft touch on all 4 extremities. No evidence of extinction is  noted.  Coordination: Cerebellar testing reveals good finger-nose-finger and heel-to-shin bilaterally.  Gait and station: Gait is normal.  Reflexes: Deep tendon reflexes are symmetric and normal bilaterally.   DIAGNOSTIC DATA (LABS, IMAGING, TESTING) - I reviewed patient records, labs, notes, testing and imaging myself where available.  Lab Results  Component Value Date   WBC 6.4 01/24/2022   HGB 10.4 (L) 01/24/2022   HCT 31.9 (L) 01/24/2022   MCV 96 01/24/2022   PLT 443 01/24/2022      Component Value Date/Time   NA 141 01/24/2022 1634   K 5.2 01/24/2022 1634   CL 102 01/24/2022 1634   CO2 22 01/24/2022 1634   GLUCOSE 98 01/24/2022 1634   GLUCOSE 133 (H) 12/11/2021 1731   GLUCOSE 76 08/28/2006 1454   BUN 20 01/24/2022 1634   CREATININE 0.86 01/24/2022 1634   CALCIUM 9.8 01/24/2022 1634   CALCIUM 10.4 11/23/2008 0000   PROT 7.9 12/11/2021 1731   ALBUMIN 4.6 12/11/2021 1731   AST 20 12/11/2021 1731   ALT 13 12/11/2021 1731   ALKPHOS 76 12/11/2021 1731   BILITOT 0.7 12/11/2021 1731   GFRNONAA >60 12/11/2021 1731   GFRAA >60 04/10/2020 1850   Lab Results  Component Value Date   CHOL 286 (H) 12/29/2015   HDL 57 12/29/2015   LDLCALC 194 (H) 12/29/2015   LDLDIRECT 111.0 11/12/2011   TRIG 177 (H) 12/29/2015   CHOLHDL 5.0 12/29/2015   Lab Results  Component Value Date   HGBA1C 5.7 (H) 12/29/2015   Lab Results  Component Value Date   VITAMINB12 960 04/26/2018   Lab Results  Component Value Date   TSH 1.12 01/08/2010    Margie Ege, AGNP-C, DNP 07/21/2023, 2:22 PM [facility] [address], [address] [place], [place] [address] [phone]

## 2023-07-21 ENCOUNTER — Ambulatory Visit (INDEPENDENT_AMBULATORY_CARE_PROVIDER_SITE_OTHER): Payer: 59 | Admitting: Neurology

## 2023-07-21 ENCOUNTER — Encounter: Payer: Self-pay | Admitting: Neurology

## 2023-07-21 VITALS — BP 124/79 | HR 89 | Ht <= 58 in | Wt 172.0 lb

## 2023-07-21 DIAGNOSIS — R413 Other amnesia: Secondary | ICD-10-CM

## 2023-07-21 DIAGNOSIS — G4733 Obstructive sleep apnea (adult) (pediatric): Secondary | ICD-10-CM | POA: Diagnosis not present

## 2023-07-21 DIAGNOSIS — G8929 Other chronic pain: Secondary | ICD-10-CM

## 2023-07-21 MED ORDER — DONEPEZIL HCL 10 MG PO TABS: 10.0000 mg | ORAL_TABLET | Freq: Every day | ORAL | 1 refills | Status: DC

## 2023-07-21 NOTE — Addendum Note (Signed)
Addended by: Glean Salvo on: 07/21/2023 05:05 PM   Modules accepted: Orders

## 2023-07-21 NOTE — Patient Instructions (Signed)
I will order sleep testing for evaluation of sleep apnea.  I refilled your Aricept.  Please continue close follow-up with PCP, return here for new or worsening issues.

## 2023-07-22 ENCOUNTER — Telehealth: Payer: Self-pay

## 2023-07-22 NOTE — Telephone Encounter (Signed)
-----   Message from Glean Salvo sent at 07/21/2023  5:05 PM EDT ----- I reviewed with Dr. Frances Furbish, she would recommend cancelling sleep testing, PCP can refer back if needed. See my note for more details. I called her but couldn't reach her to convey Dr. Teofilo Pod recommendations. Can you try to reach her tomorrow? Thanks

## 2023-07-22 NOTE — Telephone Encounter (Signed)
1st attempt, left msg for pt to call back

## 2023-07-22 NOTE — Telephone Encounter (Signed)
Call to patient, no answer. Left message to call back to review recommendations.

## 2023-07-23 NOTE — Telephone Encounter (Signed)
Left msg 1st attempt  

## 2023-07-23 NOTE — Telephone Encounter (Signed)
Pt LVM at 2:12 pm returning [facility] call regarding my appt this past Tuesday.

## 2023-07-27 NOTE — Telephone Encounter (Signed)
Phone rm advise pt of the following:  Kristin Pope consulted Dr. Frances Furbish and stated the following:   I reviewed with Dr. Frances Furbish, she would recommend cancelling sleep testing, PCP can refer back if needed.

## 2023-07-27 NOTE — Telephone Encounter (Signed)
Called pt to inform. Pt wanted to know what the reason was and also if the PCP would have to refer her if PCP is wanting her to get a sleep test. Please advise.

## 2023-07-27 NOTE — Telephone Encounter (Signed)
Pt returned phone call  and would like a call back. 

## 2023-07-27 NOTE — Telephone Encounter (Signed)
Kristin Pope,  The pt wanted to know why cancel sleep test. What the reason was behind that.  Thanks,  Production assistant, radio

## 2023-07-27 NOTE — Telephone Encounter (Signed)
1st attempt left msg  

## 2023-07-27 NOTE — Telephone Encounter (Signed)
Called and told pt pcp would need to send a new referral and she voiced gratitude and understanding

## 2023-08-12 ENCOUNTER — Telehealth: Payer: Self-pay | Admitting: Cardiology

## 2023-08-12 ENCOUNTER — Other Ambulatory Visit: Payer: Self-pay | Admitting: Cardiology

## 2023-08-12 NOTE — Telephone Encounter (Signed)
Pt c/o medication issue:  1. Name of Medication: telmisartan (MICARDIS) 40 MG tablet   2. How are you currently taking this medication (dosage and times per day)? Patient has been taken 1/2 tablet twice a day.  3. Are you having a reaction (difficulty breathing--STAT)?   4. What is your medication issue? Script says 1/2 tablet daily, patient wants to know who she is suppose to take it.  She was only given 45 tablets.

## 2023-08-12 NOTE — Telephone Encounter (Signed)
Spoke to patient who is calling about her dosage of her Telmisartan 40 mg. She stated that she has been taking 0.5 tablet twice a day. Advised her according to her prescription she is only suppose to be taking it once a day. She would like clarification from Dr Servando Salina because she has been taking it twice a day for months. Please advised.

## 2023-08-14 ENCOUNTER — Telehealth: Payer: Self-pay | Admitting: Cardiology

## 2023-08-14 MED ORDER — CARVEDILOL 12.5 MG PO TABS: 12.5000 mg | ORAL_TABLET | Freq: Two times a day (BID) | ORAL | 3 refills | Status: AC

## 2023-08-14 MED ORDER — TELMISARTAN 40 MG PO TABS: 20.0000 mg | ORAL_TABLET | Freq: Two times a day (BID) | ORAL | 3 refills | Status: AC

## 2023-08-14 MED ORDER — TELMISARTAN 20 MG PO TABS: 20.0000 mg | ORAL_TABLET | Freq: Two times a day (BID) | ORAL | 3 refills | Status: DC

## 2023-08-14 NOTE — Telephone Encounter (Signed)
Called and spoke to [facility]  Pharmacy states:   -insurance will not cover more than one tablet a day without a PA for Telmisartan  Informed pharmacy RN will update prescription to Telmisartan 40mg  1/2 tablet twice a day  Pharmacy has no further questions

## 2023-08-14 NOTE — Telephone Encounter (Signed)
Kristin Pope, [name], DO  You38 minutes ago (10:14 AM)   Please send her the prescription as she is taking it, appreciate it  ________________________________________________________ Patient identification verified by 2 forms. [name], RN   Called and spoke to patient  Informed patient:   -Rx for Telmisartan 20mg  tablet twice a day sent to pharmacy   -for this new prescription she does not need to cut it in half   -this will now reflect how she previously took 1/2 tablet of 40mg  twice a day   -Rx for Carvedilol 12.5mg  twice a day sent to pharmacy   -can discard carvedilol 6.25mg  she has at home  Patient states there may have been previous insurance issues  RN will outreach pharmacy and confirm coverage and update patient  Patient agrees with plan, no questions at this time

## 2023-08-14 NOTE — Telephone Encounter (Signed)
Patient identification verified by 2 forms. [name], RN    Called and spoke to patient  Informed patient:   -prescription updated with pharmacy   -Telmisartan 40mg  1/2 tablet twice a day  Patient verbalized understanding, no questions at this time

## 2023-08-14 NOTE — Telephone Encounter (Signed)
Patient identification verified by 2 forms. [name], RN   Called and spoke to patient  Patient states:   -Unable to refill Telmisartan Rx, received 90 day supply, next fill per insurance 09/27/23  -has been taking Telmisartan 1/2 tablet twice a day   -has not noticed any changes or new symptoms   -does not check BP at home   -did not realize any medication changes were made   -she takes carvedilol 12.5 twice a day   -she takes furosemide 20mg  as needed   -she takes Zetia 10mg  daily  Patient denies:   -lightheadedness/dizziness  Informed patient:   -per 9/16 OV Carvedilol 12.5mg  change to 6.25mg  as needed   -should be taking Talmisartan 1/2 tablet daily   -message sent to Dr. Servando Salina for assistance  Patient verbalized understanding, no questions at this time

## 2023-08-14 NOTE — Telephone Encounter (Signed)
Pt c/o medication issue:  1. Name of Medication:   telmisartan (MICARDIS) 40 MG tablet   2. How are you currently taking this medication (dosage and times per day)?   3. Are you having a reaction (difficulty breathing--STAT)?   4. What is your medication issue?   Patient stated she wants to clarify how she should be taking this medication and at which dosage. Patient stated she has a few tablets left and will need to get a refill but her insurance told her they will not fill this until end of December.

## 2023-08-17 NOTE — Telephone Encounter (Signed)
Tobb, [name], DO  to [facility] Triage  Reynolds Bowl, RN     08/17/23  9:53 AM Please write for Telmisartan 20 mg twice a day _____________________________________________ Received message from Dr. Servando Salina Please see 11/15 telephone encounter for documentation

## 2024-02-03 ENCOUNTER — Other Ambulatory Visit: Payer: Self-pay | Admitting: Neurology

## 2024-02-28 ENCOUNTER — Other Ambulatory Visit: Payer: Self-pay | Admitting: Cardiology

## 2024-06-25 ENCOUNTER — Other Ambulatory Visit: Payer: Self-pay | Admitting: Cardiology

## 2024-07-15 ENCOUNTER — Other Ambulatory Visit: Payer: Self-pay

## 2024-07-15 ENCOUNTER — Emergency Department (HOSPITAL_COMMUNITY)

## 2024-07-15 ENCOUNTER — Encounter (HOSPITAL_COMMUNITY): Payer: Self-pay

## 2024-07-15 ENCOUNTER — Observation Stay (HOSPITAL_COMMUNITY)
Admission: EM | Admit: 2024-07-15 | Discharge: 2024-07-17 | Disposition: A | Attending: Internal Medicine | Admitting: Internal Medicine

## 2024-07-15 DIAGNOSIS — G894 Chronic pain syndrome: Secondary | ICD-10-CM | POA: Diagnosis not present

## 2024-07-15 DIAGNOSIS — E860 Dehydration: Secondary | ICD-10-CM

## 2024-07-15 DIAGNOSIS — R531 Weakness: Secondary | ICD-10-CM

## 2024-07-15 DIAGNOSIS — G4733 Obstructive sleep apnea (adult) (pediatric): Secondary | ICD-10-CM | POA: Diagnosis present

## 2024-07-15 DIAGNOSIS — R413 Other amnesia: Secondary | ICD-10-CM | POA: Diagnosis present

## 2024-07-15 DIAGNOSIS — I10 Essential (primary) hypertension: Secondary | ICD-10-CM | POA: Diagnosis present

## 2024-07-15 DIAGNOSIS — R296 Repeated falls: Secondary | ICD-10-CM

## 2024-07-15 DIAGNOSIS — F10920 Alcohol use, unspecified with intoxication, uncomplicated: Secondary | ICD-10-CM | POA: Diagnosis not present

## 2024-07-15 DIAGNOSIS — K5900 Constipation, unspecified: Secondary | ICD-10-CM | POA: Insufficient documentation

## 2024-07-15 DIAGNOSIS — G8929 Other chronic pain: Secondary | ICD-10-CM | POA: Diagnosis present

## 2024-07-15 DIAGNOSIS — I471 Supraventricular tachycardia, unspecified: Secondary | ICD-10-CM | POA: Diagnosis present

## 2024-07-15 DIAGNOSIS — F339 Major depressive disorder, recurrent, unspecified: Secondary | ICD-10-CM | POA: Diagnosis present

## 2024-07-15 DIAGNOSIS — Z79899 Other long term (current) drug therapy: Secondary | ICD-10-CM | POA: Diagnosis not present

## 2024-07-15 DIAGNOSIS — M797 Fibromyalgia: Secondary | ICD-10-CM | POA: Diagnosis not present

## 2024-07-15 DIAGNOSIS — R39198 Other difficulties with micturition: Secondary | ICD-10-CM

## 2024-07-15 DIAGNOSIS — R55 Syncope and collapse: Secondary | ICD-10-CM

## 2024-07-15 DIAGNOSIS — F419 Anxiety disorder, unspecified: Secondary | ICD-10-CM | POA: Diagnosis present

## 2024-07-15 DIAGNOSIS — G459 Transient cerebral ischemic attack, unspecified: Secondary | ICD-10-CM | POA: Diagnosis present

## 2024-07-15 LAB — COMPREHENSIVE METABOLIC PANEL WITH GFR
ALT: 10 U/L (ref 0–44)
AST: 15 U/L (ref 15–41)
Albumin: 3.6 g/dL (ref 3.5–5.0)
Alkaline Phosphatase: 66 U/L (ref 38–126)
Anion gap: 12 (ref 5–15)
BUN: 11 mg/dL (ref 8–23)
CO2: 22 mmol/L (ref 22–32)
Calcium: 8.7 mg/dL — ABNORMAL LOW (ref 8.9–10.3)
Chloride: 101 mmol/L (ref 98–111)
Creatinine, Ser: 0.97 mg/dL (ref 0.44–1.00)
GFR, Estimated: >60 mL/min (ref >60)
Glucose, Bld: 107 mg/dL — ABNORMAL HIGH (ref 70–99)
Potassium: 3.7 mmol/L (ref 3.5–5.1)
Sodium: 135 mmol/L (ref 135–145)
Total Bilirubin: 0.6 mg/dL (ref 0.0–1.2)
Total Protein: 6.2 g/dL — ABNORMAL LOW (ref 6.5–8.1)

## 2024-07-15 LAB — URINALYSIS, W/ REFLEX TO CULTURE (INFECTION SUSPECTED)
Bacteria, UA: NONE SEEN
Bilirubin Urine: NEGATIVE
Glucose, UA: NEGATIVE mg/dL
Hgb urine dipstick: NEGATIVE
Ketones, ur: NEGATIVE mg/dL
Leukocytes,Ua: NEGATIVE
Nitrite: NEGATIVE
Protein, ur: NEGATIVE mg/dL
Specific Gravity, Urine: 1.005 (ref 1.005–1.030)
pH: 6 (ref 5.0–8.0)

## 2024-07-15 LAB — CBC WITH DIFFERENTIAL/PLATELET
Abs Immature Granulocytes: 0.02 K/uL (ref 0.00–0.07)
Basophils Absolute: 0 K/uL (ref 0.0–0.1)
Basophils Relative: 0 %
Eosinophils Absolute: 0 K/uL (ref 0.0–0.5)
Eosinophils Relative: 0 %
HCT: 31.6 % — ABNORMAL LOW (ref 36.0–46.0)
Hemoglobin: 10.3 g/dL — ABNORMAL LOW (ref 12.0–15.0)
Immature Granulocytes: 0 %
Lymphocytes Relative: 16 %
Lymphs Abs: 1.6 K/uL (ref 0.7–4.0)
MCH: 31.7 pg (ref 26.0–34.0)
MCHC: 32.6 g/dL (ref 30.0–36.0)
MCV: 97.2 fL (ref 80.0–100.0)
Monocytes Absolute: 0.7 K/uL (ref 0.1–1.0)
Monocytes Relative: 7 %
Neutro Abs: 7.5 K/uL (ref 1.7–7.7)
Neutrophils Relative %: 77 %
Platelets: 301 K/uL (ref 150–400)
RBC: 3.25 MIL/uL — ABNORMAL LOW (ref 3.87–5.11)
RDW: 14.3 % (ref 11.5–15.5)
WBC: 9.8 K/uL (ref 4.0–10.5)
nRBC: 0 % (ref 0.0–0.2)

## 2024-07-15 LAB — I-STAT CHEM 8, ED
BUN: 12 mg/dL (ref 8–23)
Calcium, Ion: 1.1 mmol/L — ABNORMAL LOW (ref 1.15–1.40)
Chloride: 102 mmol/L (ref 98–111)
Creatinine, Ser: 1 mg/dL (ref 0.44–1.00)
Glucose, Bld: 103 mg/dL — ABNORMAL HIGH (ref 70–99)
HCT: 32 % — ABNORMAL LOW (ref 36.0–46.0)
Hemoglobin: 10.9 g/dL — ABNORMAL LOW (ref 12.0–15.0)
Potassium: 3.7 mmol/L (ref 3.5–5.1)
Sodium: 135 mmol/L (ref 135–145)
TCO2: 21 mmol/L — ABNORMAL LOW (ref 22–32)

## 2024-07-15 LAB — TYPE AND SCREEN
ABO/RH(D): A NEG
Antibody Screen: NEGATIVE

## 2024-07-15 LAB — PROTIME-INR
INR: 1 (ref 0.8–1.2)
Prothrombin Time: 14 s (ref 11.4–15.2)

## 2024-07-15 LAB — I-STAT CG4 LACTIC ACID, ED
Lactic Acid, Venous: 1.5 mmol/L (ref 0.5–1.9)
Lactic Acid, Venous: 0.7 mmol/L (ref 0.5–1.9)

## 2024-07-15 MED ORDER — ACETAMINOPHEN 325 MG PO TABS
650.0000 mg | ORAL_TABLET | Freq: Once | ORAL | Status: AC
  Administered 2024-07-15: 650 mg via ORAL
  Filled 2024-07-15: qty 2

## 2024-07-15 MED ORDER — SODIUM CHLORIDE 0.9 % IV BOLUS
1000.0000 mL | Freq: Once | INTRAVENOUS | Status: AC
  Administered 2024-07-15: 1000 mL via INTRAVENOUS

## 2024-07-15 NOTE — ED Notes (Signed)
 Attempted to ambulate pt, unsuccessful. Pt stated her legs were feeling weak and that she started to become light headed. EDP notified.

## 2024-07-15 NOTE — ED Triage Notes (Signed)
 PT BIB [facility] from home,  PT states she has been having difficulty urinating in past week with weakness. PT syncopized with EMS when they trie4d to stand her up. Received 900ml NS bolus en route. [facility] VS:  BP 78/40 at scene - 115/68 after NS,  68 HR, 98% Spo2 RA,

## 2024-07-15 NOTE — ED Notes (Signed)
 PT provided bedside commode, was unable to catch urine and had bowel movement

## 2024-07-15 NOTE — ED Notes (Addendum)
 Assisted pt w/ bedside commode. Unable to get clean catch urine sample, pt aware of need for urine

## 2024-07-15 NOTE — ED Provider Notes (Signed)
 Care assumed from Dr. Mannie.  At time of transfer of care, patient is awaiting on urinalysis prior to likely admission for multiple falls and near syncopal episodes over the last week.  Patient reportedly had urinary symptoms with some difficulty urinating and urgency and anticipate she will likely need admission.  9:20 PM Patient's workup surprisingly is reassuring.  Patient tells me she still feels lightheaded whenever she sits up or stands up or tries to walk around.  She also tells me that over the last week she has had multiple times where she has woken up on the ground after passing out on room what happened.  No documented history of seizures that I can see however this passing out or losing consciousness without preceding symptoms is concerning.  Will have patient stood up and see if she can safely ambulate but if she continues to feel lightheaded despite getting fluids and despite reassuring workup, patient may require admission for near syncopal and syncopal episodes without clear explanation with multiple falls this week.   9:49 PM Nursing tried to stand patient and she got very lightheaded and fatigued and was unable to tolerate ambulation.  Due to the reported near syncopal and syncopal episodes this week and multiple falls, will call for admission for concerning spells.   Clinical Impression: 1. Syncope, unspecified syncope type   2. Falls   3. Near syncope   4. Dehydration     Disposition: Admit  This note was prepared with assistance of Dragon voice recognition software. Occasional wrong-word or sound-a-like substitutions may have occurred due to the inherent limitations of voice recognition software.     Mitali Shenefield, Lonni PARAS, MD 07/15/24 [phone]

## 2024-07-15 NOTE — ED Notes (Signed)
Bladder scan 599

## 2024-07-15 NOTE — ED Provider Notes (Signed)
 [place] [facility] AT [facility] Provider Note  CSN: [id number] Arrival date & time: 07/15/24 1351  Chief Complaint(s) No chief complaint on file.  HPI Kristin Pope is a 68 y.o. female who is here today for syncopal episode, recurrent falls over the last 1 week.  Patient reports that she has been feeling somewhat weak over the last 1 week.  She was sitting on the porch with her mother and her mother's caregiver.  She says that she felt like she was having difficult time speaking, stood up, and felt very lightheaded and fell down.  She did not lose consciousness.  She has reportedly been having episodes like this whole week.  When EMS arrived, patient did not have any neurological deficits.  They noted her to be hypotensive.  Patient reports she has not been having bowel movements over the last several days.  She also endorses some difficulty with urination.   Past Medical History Past Medical History:  Diagnosis Date   Abnormal finding on MRI of brain 02/07/2009   Qualifier: Diagnosis of  By: Trudy, LPN, Kendell HERO    ALLERGIC RHINITIS 03/29/2007   Qualifier: Diagnosis of  By: Sherron CMA, Lakisha     ANEMIA, B12 DEFICIENCY 05/18/2007   Qualifier: Diagnosis of  By: Trudy, LPN, Bonnye M    Anemia, pernicious    b12 def.   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 03/2018: Chronic; noted MRI 11/2015   Cerebrovascular disease or lesion 12/30/2015   Cervical radiculopathy 06/16/2017   Cervical spondylosis with radiculopathy    C2 -- C7   Chronic fatigue    Chronic pain    neck, back   Common migraine with intractable migraine 09/01/2018   CONSTIPATION, SLOW TRANSIT 10/04/2010   Qualifier: Diagnosis of  By:  Mavis MD, Norleen BRAVO    CYSTITIS, CHRONIC INTERSTITIAL 06/27/2009   Qualifier: Diagnosis of  By: Mavis MD, Norleen BRAVO    DDD (degenerative disc disease), lumbosacral    DEGENERATIVE DISC DISEASE, CERVICAL SPINE 05/18/2007   Qualifier: Diagnosis of  By: Mavis MD, John E    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 Followed by gynecology   Eczema    Essential hypertension 09/14/2007   Qualifier: Diagnosis of  By: Waylan CMA AAMA, Debby     Family history of adverse reaction to anesthesia    sister has problems waking up   Family history of breast cancer    FIBROMYALGIA 03/29/2007   Qualifier: Diagnosis of  By: Sherron CMA, Steen     Frontal headache 04/16/2018   GERD (gastroesophageal reflux disease)    Headache    constant headaches   HIP PAIN, LEFT, CHRONIC 07/04/2008   Qualifier: Diagnosis of  By: Mavis MD, Norleen BRAVO    History of colonic diverticulitis 08/16/2010   w/ perforation (lower GI bleed)--- resolved without surgerical intervention   History of TIA (transient ischemic attack) 12/28/2015   per MRI - chronic left cerebellar infarct--- no residual   Hx of pyelonephritis 09/2005   due  to UTI   Hypercalcemia 11/18/2007   Qualifier: Diagnosis of  By: Mavis MD, John E    Hyperlipidemia LDL goal <70 12/30/2015   Hypertension    Hyponatremia 04/16/2018   Insomnia 03/03/2011   Irritable bowel syndrome 08/14/2009   Qualifier: Diagnosis of  By: Mavis MD, John E    Lipoma of other specified sites 04/13/2009   Annotation: over right ankle Qualifier: Diagnosis of  By: Mavis MD, Norleen BRAVO    LOW BACK PAIN 05/26/2008   Qualifier: Diagnosis of  By: Mavis MD, John E    Lumbosacral spondylosis    L2-3, L4-5   MALAISE AND FATIGUE 07/04/2008   Qualifier: Diagnosis of  By: Mavis MD, Norleen BRAVO    Memory difficulty  08/11/2019   Mild obstructive sleep apnea    per study 06/ 2009 mild osa  AHI 13/hr---  recommendation given mouth appliance, loss wt., cpap   Mild pulmonary hypertension (HCC) 12/30/2015   Mixed hyperlipidemia    Muscle weakness (generalized) 12/20/2008   Qualifier: Diagnosis of  By: Mavis MD, Norleen BRAVO    OA (osteoarthritis)    right shoulder AC joint   Obese 07/19/2015   OSA (obstructive sleep apnea) 03/03/2011   NPSG 2009:  AHI 13/hr.  No treatment   Osteopenia of multiple sites 01/29/2017   Polypharmacy 05/03/2018   Postmenopausal bleeding 09/14/2017   Formatting of this note might be different from the original. Followed by gynecology   Pre-diabetes    Proteinuria 12/23/2007   Qualifier: Diagnosis of  By: Mavis MD, John E    Recurrent UTI 12/22/2017   Right rotator cuff tear    S/P left THA, AA 07/17/2015   SYNCOPE 03/13/2009   Qualifier: Diagnosis of  By: Mavis MD, Norleen BRAVO    Thrombocytosis 05/22/2017   Formatting of this note might be different from the original. Overview:  New; mild. Trend at regular f/u.  08/2018: Slight to 419k Formatting of this note might be different from the original. New; mild. Trend at regular f/u.   TIA (transient ischemic attack) 12/28/2015   Unspecified hypothyroidism 05/16/2009   Qualifier: Diagnosis of  By: Mavis MD, John E    Vitamin B12 deficiency 06/03/2018   Vitamin D  deficiency 06/03/2018   Patient Active Problem List   Diagnosis Date Noted   Palpitations 06/18/2021   Shortness of breath 06/18/2021   Chest pain of uncertain etiology 06/18/2021   Prediabetes 06/18/2021   Obesity (BMI 30-39.9) 06/18/2021   Lumbar spondylosis 02/11/2021   Memory difficulty 08/11/2019   Common migraine with intractable migraine 09/01/2018   Vitamin B12 deficiency 06/03/2018   Vitamin D  deficiency 06/03/2018   Polypharmacy 05/03/2018   Frontal headache 04/16/2018   Hyponatremia 04/16/2018   Recurrent UTI 12/22/2017   Atrophic kidney 12/09/2017    Dysfunctional uterine bleeding 09/14/2017   Postmenopausal bleeding 09/14/2017   S/P arthroscopy of right shoulder 08/13/2017   Cervical radiculopathy 06/16/2017   Genetic testing 05/29/2017   Thrombocytosis 05/22/2017   Family history of breast cancer    Osteopenia of multiple sites 01/29/2017   Hyperlipidemia LDL goal <70 12/30/2015   Cerebrovascular disease or lesion 12/30/2015   Mild pulmonary hypertension (HCC) 12/30/2015   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   TIA (transient ischemic attack) 12/28/2015   GERD (gastroesophageal reflux disease) 07/23/2015   Chronic pain 07/23/2015   Obese 07/19/2015   S/P left THA, AA 07/17/2015   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   Insomnia 03/03/2011  OSA (obstructive sleep apnea) 03/03/2011   Depression, major, recurrent 12/02/2010   CONSTIPATION, SLOW TRANSIT 10/04/2010   IRRITABLE BOWEL SYNDROME 08/14/2009   CYSTITIS, CHRONIC INTERSTITIAL 06/27/2009   UNSPECIFIED HYPOTHYROIDISM 05/16/2009   LIPOMA OF OTHER SPECIFIED SITES 04/13/2009   BRUXISM 04/13/2009   SYNCOPE 03/13/2009   Abnormal finding on MRI of brain 02/07/2009   MUSCLE WEAKNESS (GENERALIZED) 12/20/2008   UNSPECIFIED ALLERGIC ALVEOLITIS AND PNEUMONITIS 08/02/2008   HIP PAIN, LEFT, CHRONIC 07/04/2008   MALAISE AND FATIGUE 07/04/2008   LOW BACK PAIN 05/26/2008   PROTEINURIA 12/23/2007   HYPERCALCEMIA 11/18/2007   Essential hypertension 09/14/2007   ANEMIA, B12 DEFICIENCY 05/18/2007   DEGENERATIVE DISC DISEASE, CERVICAL SPINE 05/18/2007   ALLERGIC RHINITIS 03/29/2007   FIBROMYALGIA 03/29/2007   Home Medication(s) Prior to Admission medications   Medication Sig Start Date End Date Taking? Authorizing Provider  acetaminophen  (TYLENOL ) 650 MG CR tablet Take 1,300 mg every 8 (eight) hours as needed by mouth for pain.    [provider]  amitriptyline (ELAVIL) 50 MG tablet Take 50 mg by mouth at bedtime.    [provider]  Armodafinil  150 MG tablet Take 150 mg by mouth 2 (two) times daily as needed (energy boost).    [provider]  aspirin  81 MG EC tablet Take 1 tablet by mouth daily. 04/26/21   [provider]  carvedilol  (COREG ) 12.5 MG tablet Take 1 tablet (12.5 mg total) by mouth 2 (two) times daily. 08/14/23   Tobb, [name], DO  celecoxib  (CELEBREX ) 200 MG capsule Take 200 mg by mouth 2 (two) times daily as needed for mild pain.    [provider]  cholecalciferol  (VITAMIN D ) 1000 UNITS tablet Take 1,000 Units daily by mouth.     [provider]  clonazePAM  (KLONOPIN ) 1 MG tablet Take 1 mg by mouth 4 (four) times daily as needed for anxiety.    [provider]  cyanocobalamin  100 MCG tablet Take 100 mcg by mouth daily.    [provider]  donepezil  (ARICEPT ) 10 MG tablet TAKE 1 TABLET BY MOUTH EVERYDAY AT BEDTIME 02/03/24   Gayland Lauraine PARAS, NP  DULoxetine  (CYMBALTA ) 60 MG capsule Take 60 mg by mouth at bedtime.  07/28/15   [provider]  escitalopram (LEXAPRO) 10 MG tablet Take 10 mg by mouth every evening. 05/24/21   [provider]  ezetimibe  (ZETIA ) 10 MG tablet Take 1 tablet (10 mg total) by mouth daily. 06/15/23   Tobb, [name], DO  furosemide  (LASIX ) 20 MG tablet TAKE 1 TABLET BY MOUTH AS NEEDED 02/29/24   Tobb, [name], DO  gabapentin (NEURONTIN) 300 MG capsule Take 300 mg by mouth 3 (three) times daily.    [provider]  HYDROmorphone  (DILAUDID ) 2 MG tablet Take 2 mg by mouth 3 (three) times daily as needed for severe pain.    [provider]  lubiprostone  (AMITIZA ) 24 MCG capsule Take 24 mcg by mouth at bedtime.    [provider]  methocarbamol  (ROBAXIN ) 500 MG tablet Take 500 mg by mouth 4 (four) times daily as needed for muscle spasms. 05/27/21   [provider]  montelukast  (SINGULAIR ) 10 MG tablet Take 10 mg by mouth at bedtime.     [provider]  nitrofurantoin ,  macrocrystal-monohydrate, (MACROBID ) 100 MG capsule Take 100 mg at bedtime by mouth. Continuous course 12/07/15   [provider]  nitroGLYCERIN  (NITROSTAT ) 0.4 MG SL tablet Place 1 tablet (0.4 mg total) under the tongue every 5 (  five) minutes as needed. 06/18/21 01/14/22  Tobb, [name], DO  omeprazole  (PRILOSEC) 40 MG capsule Take 40 mg at bedtime by mouth.     [provider]  ondansetron  (ZOFRAN -ODT) 4 MG disintegrating tablet Take 1 tablet (4 mg total) by mouth every 8 (eight) hours as needed for up to 10 doses for nausea or vomiting. 12/11/21   Phebe Fonda RAMAN, MD  telmisartan  (MICARDIS ) 40 MG tablet Take 0.5 tablets (20 mg total) by mouth in the morning and at bedtime. 08/14/23   Tobb, [name], DO  tizanidine  (ZANAFLEX ) 2 MG capsule Take 2 mg by mouth 4 (four) times daily as needed for muscle spasms.    [provider]  triamcinolone cream (KENALOG) 0.1 % Apply 1 application topically 2 (two) times daily.    [provider]  zolpidem  (AMBIEN  CR) 12.5 MG CR tablet Take 12.5 mg by mouth at bedtime as needed for sleep.    [provider]                                                                                                                                    Past Surgical History Past Surgical History:  Procedure Laterality Date   BREAST SURGERY     breast biopsy-benign   COLONOSCOPY  last one 08-08-2009   DILATATION & CURRETTAGE/HYSTEROSCOPY WITH RESECTOCOPE  02-03-2011   dr lavoie  [facility]   polypectomy   RADIAL KERATOTOMY     RADIAL OPTIC NEUROTOMY     twice in lumbar area of back-every 6 months   SHOULDER ARTHROSCOPY WITH ROTATOR CUFF REPAIR Right 08/13/2017   Procedure: RIGHT SHOULDER ARTHROSCOPY, DEBRIDEMENT, BICEPS TENOTOMY, ROTATOR CUFF REPAIR, DISTAL CLAVICLE RESECTION;  Surgeon: Gerome Charleston, MD;  Location: [facility];  Service: Orthopedics;  Laterality: Right;   TOTAL HIP ARTHROPLASTY Left 07/17/2015   Procedure: LEFT  TOTAL HIP ARTHROPLASTY ANTERIOR APPROACH;  Surgeon: Donnice Car, MD;  Location: [facility];  Service: Orthopedics;  Laterality: Left;   TOTAL HIP ARTHROPLASTY Right 08/21/2015   Procedure: RIGHT TOTAL HIP ARTHROPLASTY ANTERIOR APPROACH;  Surgeon: Donnice Car, MD;  Location: [facility];  Service: Orthopedics;  Laterality: Right;   TRANSTHORACIC ECHOCARDIOGRAM  12/30/2015   ef 60-65%, grade 1 diastolic dysfunction/  mild MR/ trivial TR   Family History Family History  Problem Relation Age of Onset   Heart disease Mother    Heart attack Mother    Asthma Mother    Breast cancer Mother 62   Other Father        car accident   Breast cancer Sister 54   Lung cancer Sister    Other Sister        ATM mutation   Heart attack Brother 36   Coronary artery disease Brother    Breast cancer Maternal Grandmother    Rheum arthritis Maternal Grandmother    Stroke Maternal Grandfather    Brain cancer Paternal Grandfather  Lung cancer Paternal Grandfather    Breast cancer Maternal Aunt 55   Alzheimer's disease Paternal Grandmother    Testicular cancer Cousin 58   Other Other        ATM mutation    Social History Social History   Tobacco Use   Smoking status: Former    Current packs/day: 0.00    Types: Cigarettes    Start date: 09/29/1973    Quit date: 09/29/1984    Years since quitting: 39.8   Smokeless tobacco: Never  Vaping Use   Vaping status: Never Used  Substance Use Topics   Alcohol use: Yes    Comment: wine occassionally   Drug use: No    Comment: hx occasional marjuana (as documented 2017)   Allergies Morphine and codeine, Aminoglutethimide, Buprenorphine hcl, Fentanyl , Gluten meal, Griseofulvin ultramicrosize [griseofulvin], Hydrocodone , Lactose intolerance (gi), Lipitor [atorvastatin], Monosodium glutamate, Statins, Tetracycline, Tramadol, and Codeine  Review of Systems Review of Systems  Physical Exam Vital Signs  I have reviewed the triage vital signs BP (!) 101/8 (BP  Location: Right Arm)   Pulse 63   Temp 97.7 F (36.5 C) (Oral)   Resp 14   Ht 4' 9 (1.448 m)   Wt 74.8 kg   SpO2 99%   BMI 35.71 kg/m   Physical Exam Vitals and nursing note reviewed.  Constitutional:      Appearance: She is ill-appearing.  HENT:     Head: Normocephalic.  Eyes:     Pupils: Pupils are equal, round, and reactive to light.  Cardiovascular:     Rate and Rhythm: Normal rate.  Pulmonary:     Effort: Pulmonary effort is normal.  Abdominal:     General: Abdomen is flat. There is no distension.     Palpations: Abdomen is soft.     Tenderness: There is no abdominal tenderness. There is no guarding.  Skin:    Coloration: Skin is pale.  Neurological:     General: No focal deficit present.     Mental Status: She is alert and oriented to person, place, and time.     Comments: Cranial nerves II through XII intact.  Normal speech production and fluency.  No weakness or abnormal sensation in the upper extremities or lower extremities.  Gait deferred.     ED Results and Treatments Labs (all labs ordered are listed, but only abnormal results are displayed) Labs Reviewed  I-STAT CHEM 8, ED - Abnormal; Notable for the following components:      Result Value   Glucose, Bld 103 (*)    Calcium , Ion 1.10 (*)    TCO2 21 (*)    Hemoglobin 10.9 (*)    HCT 32.0 (*)    All other components within normal limits  CULTURE, BLOOD (ROUTINE X 2)  CULTURE, BLOOD (ROUTINE X 2)  COMPREHENSIVE METABOLIC PANEL WITH GFR  CBC WITH DIFFERENTIAL/PLATELET  PROTIME-INR  URINALYSIS, W/ REFLEX TO CULTURE (INFECTION SUSPECTED)  I-STAT CG4 LACTIC ACID, ED  TYPE AND SCREEN  Radiology No results found.  Pertinent labs & imaging results that were available during my care of the patient were reviewed by me and considered in my medical decision making (see MDM for  details).  Medications Ordered in ED Medications - No data to display                                                                                                                                   Procedures Procedures  (including critical care time)  Medical Decision Making / ED Course   This patient presents to the ED for concern of syncope, this involves an extensive number of treatment options, and is a complaint that carries with it a high risk of complications and morbidity.  The differential diagnosis includes anemia, dehydration, bowel obstruction, UTI, consider sepsis, TIA, less likely intracranial hemorrhage, less likely acute stroke.  MDM: On exam, patient does have soft blood pressures.  Her initial recorded blood pressure 101/80 is certainly an error.  However, patient does have blood pressures in the 90s over 60s.  She is alert, oriented.  She able to answer all questions appropriately.  She does not have any neurological deficits on my exam.  Symptoms earlier may have been a TIA.  Patient is extremely pale, I expect the patient to likely have a low hemoglobin.  Type and screen ordered.  Patient afebrile, nontachycardic.  Lower suspicion for sepsis.  She reports that she has not been having any bowel movements recently.  CT imaging of the abdomen ordered.  Patient be signed out to Dr. Ginger pending her imaging blood work and disposition.   Additional history obtained: -Additional history obtained from EMS -External records from outside source obtained and reviewed including: Chart review including previous notes, labs, imaging, consultation notes   Lab Tests: -I ordered, reviewed, and interpreted labs.   The pertinent results include:   Labs Reviewed  I-STAT CHEM 8, ED - Abnormal; Notable for the following components:      Result Value   Glucose, Bld 103 (*)    Calcium , Ion 1.10 (*)    TCO2 21 (*)    Hemoglobin 10.9 (*)    HCT 32.0 (*)    All other components  within normal limits  CULTURE, BLOOD (ROUTINE X 2)  CULTURE, BLOOD (ROUTINE X 2)  COMPREHENSIVE METABOLIC PANEL WITH GFR  CBC WITH DIFFERENTIAL/PLATELET  PROTIME-INR  URINALYSIS, W/ REFLEX TO CULTURE (INFECTION SUSPECTED)  I-STAT CG4 LACTIC ACID, ED  TYPE AND SCREEN       Medicines ordered and prescription drug management: No orders of the defined types were placed in this encounter.   -I have reviewed the patients home medicines and have made adjustments as needed   Cardiac Monitoring: The patient was maintained on a cardiac monitor.  I personally viewed and interpreted the cardiac monitored which showed an underlying rhythm of: Normal sinus rhythm  Social Determinants of Health:  Factors  impacting patients care include: Lack of access to primary care   Reevaluation: After the interventions noted above, I reevaluated the patient and found that they have :improved  Co morbidities that complicate the patient evaluation  Past Medical History:  Diagnosis Date   Abnormal finding on MRI of brain 02/07/2009   Qualifier: Diagnosis of  By: Trudy, LPN, Kendell HERO    ALLERGIC RHINITIS 03/29/2007   Qualifier: Diagnosis of  By: Sherron CMA, Lakisha     ANEMIA, B12 DEFICIENCY 05/18/2007   Qualifier: Diagnosis of  By: Trudy, LPN, Bonnye M    Anemia, pernicious    b12 def.   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 03/2018: Chronic; noted MRI 11/2015   Cerebrovascular disease or lesion 12/30/2015   Cervical radiculopathy 06/16/2017   Cervical spondylosis with radiculopathy    C2 -- C7   Chronic fatigue    Chronic pain    neck, back   Common migraine with intractable migraine 09/01/2018   CONSTIPATION, SLOW TRANSIT 10/04/2010    Qualifier: Diagnosis of  By: Mavis MD, Norleen BRAVO    CYSTITIS, CHRONIC INTERSTITIAL 06/27/2009   Qualifier: Diagnosis of  By: Mavis MD, Norleen BRAVO    DDD (degenerative disc disease), lumbosacral    DEGENERATIVE DISC DISEASE, CERVICAL SPINE 05/18/2007   Qualifier: Diagnosis of  By: Mavis MD, John E    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 Followed by gynecology   Eczema    Essential hypertension 09/14/2007   Qualifier: Diagnosis of  By: Waylan CMA AAMA, Debby     Family history of adverse reaction to anesthesia    sister has problems waking up   Family history of breast cancer    FIBROMYALGIA 03/29/2007   Qualifier: Diagnosis of  By: Sherron CMA, Steen     Frontal headache 04/16/2018   GERD (gastroesophageal reflux disease)    Headache    constant headaches   HIP PAIN, LEFT, CHRONIC 07/04/2008   Qualifier: Diagnosis of  By: Mavis MD, John E    History of colonic diverticulitis 08/16/2010   w/ perforation (lower GI bleed)--- resolved without surgerical intervention   History of TIA (transient ischemic attack) 12/28/2015   per MRI - chronic left cerebellar infarct--- no residual   Hx of pyelonephritis 09/2005   due to UTI   Hypercalcemia 11/18/2007   Qualifier: Diagnosis of  By: Mavis MD, John E    Hyperlipidemia LDL goal <70 12/30/2015   Hypertension    Hyponatremia 04/16/2018   Insomnia 03/03/2011   Irritable bowel syndrome 08/14/2009   Qualifier: Diagnosis of  By: Mavis MD, John E    Lipoma of other specified sites 04/13/2009   Annotation: over right ankle Qualifier: Diagnosis of  By: Mavis MD, Norleen BRAVO    LOW BACK PAIN 05/26/2008   Qualifier: Diagnosis of  By: Mavis MD, John E    Lumbosacral spondylosis    L2-3, L4-5   MALAISE AND FATIGUE 07/04/2008   Qualifier: Diagnosis of  By: Mavis MD, Norleen BRAVO  Memory difficulty 08/11/2019   Mild obstructive sleep apnea    per study 06/ 2009 mild osa  AHI 13/hr---  recommendation given mouth appliance, loss wt., cpap   Mild pulmonary hypertension (HCC) 12/30/2015   Mixed hyperlipidemia    Muscle weakness (generalized) 12/20/2008   Qualifier: Diagnosis of  By: Mavis MD, Norleen BRAVO    OA (osteoarthritis)    right shoulder AC joint   Obese 07/19/2015   OSA (obstructive sleep apnea) 03/03/2011   NPSG 2009:  AHI 13/hr.  No treatment   Osteopenia of multiple sites 01/29/2017   Polypharmacy 05/03/2018   Postmenopausal bleeding 09/14/2017   Formatting of this note might be different from the original. Followed by gynecology   Pre-diabetes    Proteinuria 12/23/2007   Qualifier: Diagnosis of  By: Mavis MD, John E    Recurrent UTI 12/22/2017   Right rotator cuff tear    S/P left THA, AA 07/17/2015   SYNCOPE 03/13/2009   Qualifier: Diagnosis of  By: Mavis MD, Norleen BRAVO    Thrombocytosis 05/22/2017   Formatting of this note might be different from the original. Overview:  New; mild. Trend at regular f/u.  08/2018: Slight to 419k Formatting of this note might be different from the original. New; mild. Trend at regular f/u.   TIA (transient ischemic attack) 12/28/2015   Unspecified hypothyroidism 05/16/2009   Qualifier: Diagnosis of  By: Mavis MD, John E    Vitamin B12 deficiency 06/03/2018   Vitamin D  deficiency 06/03/2018       Final Clinical Impression(s) / ED Diagnoses Final diagnoses:  Syncope, unspecified syncope type     @PCDICTATION @    Mannie Pac T, DO 07/15/24 1454

## 2024-07-16 ENCOUNTER — Observation Stay (HOSPITAL_COMMUNITY)

## 2024-07-16 ENCOUNTER — Other Ambulatory Visit: Payer: Self-pay

## 2024-07-16 DIAGNOSIS — R55 Syncope and collapse: Secondary | ICD-10-CM

## 2024-07-16 DIAGNOSIS — F419 Anxiety disorder, unspecified: Secondary | ICD-10-CM | POA: Diagnosis not present

## 2024-07-16 DIAGNOSIS — G459 Transient cerebral ischemic attack, unspecified: Secondary | ICD-10-CM | POA: Diagnosis not present

## 2024-07-16 DIAGNOSIS — I10 Essential (primary) hypertension: Secondary | ICD-10-CM | POA: Diagnosis not present

## 2024-07-16 DIAGNOSIS — I951 Orthostatic hypotension: Secondary | ICD-10-CM | POA: Diagnosis not present

## 2024-07-16 DIAGNOSIS — I471 Supraventricular tachycardia, unspecified: Secondary | ICD-10-CM | POA: Diagnosis not present

## 2024-07-16 DIAGNOSIS — G4733 Obstructive sleep apnea (adult) (pediatric): Secondary | ICD-10-CM | POA: Diagnosis not present

## 2024-07-16 LAB — ECHOCARDIOGRAM COMPLETE
AR max vel: 1.68 cm2
AV Area VTI: 1.56 cm2
AV Area mean vel: 1.59 cm2
AV Mean grad: 3.6 mmHg
AV Peak grad: 6.3 mmHg
Ao pk vel: 1.26 m/s
Area-P 1/2: 3.37 cm2
Height: 57 in
MV M vel: 5.38 m/s
MV Peak grad: 115.8 mmHg
S' Lateral: 2.8 cm
Weight: 2640 [oz_av]

## 2024-07-16 LAB — GLUCOSE, CAPILLARY
Glucose-Capillary: 96 mg/dL (ref 70–99)
Glucose-Capillary: 116 mg/dL — ABNORMAL HIGH (ref 70–99)
Glucose-Capillary: 134 mg/dL — ABNORMAL HIGH (ref 70–99)

## 2024-07-16 LAB — CBC
HCT: 30.8 % — ABNORMAL LOW (ref 36.0–46.0)
Hemoglobin: 10 g/dL — ABNORMAL LOW (ref 12.0–15.0)
MCH: 31.8 pg (ref 26.0–34.0)
MCHC: 32.5 g/dL (ref 30.0–36.0)
MCV: 98.1 fL (ref 80.0–100.0)
Platelets: 316 K/uL (ref 150–400)
RBC: 3.14 MIL/uL — ABNORMAL LOW (ref 3.87–5.11)
RDW: 14.3 % (ref 11.5–15.5)
WBC: 6.4 K/uL (ref 4.0–10.5)
nRBC: 0 % (ref 0.0–0.2)

## 2024-07-16 LAB — BASIC METABOLIC PANEL WITH GFR
Anion gap: 13 (ref 5–15)
BUN: 6 mg/dL — ABNORMAL LOW (ref 8–23)
CO2: 21 mmol/L — ABNORMAL LOW (ref 22–32)
Calcium: 8.6 mg/dL — ABNORMAL LOW (ref 8.9–10.3)
Chloride: 105 mmol/L (ref 98–111)
Creatinine, Ser: 0.82 mg/dL (ref 0.44–1.00)
GFR, Estimated: >60 mL/min (ref >60)
Glucose, Bld: 105 mg/dL — ABNORMAL HIGH (ref 70–99)
Potassium: 3.5 mmol/L (ref 3.5–5.1)
Sodium: 139 mmol/L (ref 135–145)

## 2024-07-16 LAB — HIV ANTIBODY (ROUTINE TESTING W REFLEX): HIV Screen 4th Generation wRfx: NONREACTIVE

## 2024-07-16 LAB — MAGNESIUM: Magnesium: 1.6 mg/dL — ABNORMAL LOW (ref 1.7–2.4)

## 2024-07-16 MED ORDER — PHENYLEPHRINE-MINERAL OIL-PET 0.25-14-74.9 % RE OINT
1.0000 | TOPICAL_OINTMENT | Freq: Two times a day (BID) | RECTAL | Status: DC | PRN
  Filled 2024-07-16: qty 57

## 2024-07-16 MED ORDER — ASPIRIN 81 MG PO TBEC
81.0000 mg | DELAYED_RELEASE_TABLET | Freq: Every day | ORAL | Status: DC
  Administered 2024-07-16 – 2024-07-17 (×2): 81 mg via ORAL
  Filled 2024-07-16 (×2): qty 1

## 2024-07-16 MED ORDER — PANTOPRAZOLE SODIUM 40 MG PO TBEC
40.0000 mg | DELAYED_RELEASE_TABLET | Freq: Every day | ORAL | Status: DC
  Administered 2024-07-16 (×2): 40 mg via ORAL
  Filled 2024-07-16 (×2): qty 1

## 2024-07-16 MED ORDER — ENOXAPARIN SODIUM 40 MG/0.4ML IJ SOSY
40.0000 mg | PREFILLED_SYRINGE | INTRAMUSCULAR | Status: DC
  Administered 2024-07-16 – 2024-07-17 (×2): 40 mg via SUBCUTANEOUS
  Filled 2024-07-16 (×2): qty 0.4

## 2024-07-16 MED ORDER — ASPIRIN-ACETAMINOPHEN-CAFFEINE 250-250-65 MG PO TABS
2.0000 | ORAL_TABLET | Freq: Once | ORAL | Status: AC
  Administered 2024-07-16: 2 via ORAL
  Filled 2024-07-16: qty 2

## 2024-07-16 MED ORDER — ACETAMINOPHEN 325 MG PO TABS
650.0000 mg | ORAL_TABLET | Freq: Four times a day (QID) | ORAL | Status: DC | PRN
  Administered 2024-07-16 – 2024-07-17 (×3): 650 mg via ORAL
  Filled 2024-07-16 (×3): qty 2

## 2024-07-16 MED ORDER — GABAPENTIN 300 MG PO CAPS: 300.0000 mg | ORAL_CAPSULE | Freq: Two times a day (BID) | ORAL | Status: DC | PRN

## 2024-07-16 MED ORDER — ESCITALOPRAM OXALATE 10 MG PO TABS
10.0000 mg | ORAL_TABLET | Freq: Every evening | ORAL | Status: DC
Start: 2024-07-16 — End: 2024-07-17
  Filled 2024-07-16 (×3): qty 1

## 2024-07-16 MED ORDER — IRBESARTAN 75 MG PO TABS
75.0000 mg | ORAL_TABLET | Freq: Every day | ORAL | Status: DC
  Administered 2024-07-16 – 2024-07-17 (×2): 75 mg via ORAL
  Filled 2024-07-16 (×2): qty 1

## 2024-07-16 MED ORDER — DULOXETINE HCL 60 MG PO CPEP
60.0000 mg | ORAL_CAPSULE | Freq: Every day | ORAL | Status: DC
  Administered 2024-07-16 (×2): 60 mg via ORAL
  Filled 2024-07-16 (×2): qty 1

## 2024-07-16 MED ORDER — ACETAMINOPHEN 650 MG RE SUPP: 650.0000 mg | Freq: Four times a day (QID) | RECTAL | Status: DC | PRN

## 2024-07-16 MED ORDER — CLONAZEPAM 1 MG PO TABS
1.0000 mg | ORAL_TABLET | Freq: Four times a day (QID) | ORAL | Status: DC | PRN
  Administered 2024-07-16 – 2024-07-17 (×3): 1 mg via ORAL
  Filled 2024-07-16 (×3): qty 1

## 2024-07-16 MED ORDER — AMITRIPTYLINE HCL 50 MG PO TABS
150.0000 mg | ORAL_TABLET | Freq: Every day | ORAL | Status: DC
  Administered 2024-07-16: 150 mg via ORAL
  Filled 2024-07-16: qty 3

## 2024-07-16 MED ORDER — CARVEDILOL 12.5 MG PO TABS
12.5000 mg | ORAL_TABLET | Freq: Two times a day (BID) | ORAL | Status: DC
  Administered 2024-07-16 – 2024-07-17 (×3): 12.5 mg via ORAL
  Filled 2024-07-16 (×3): qty 1

## 2024-07-16 MED ORDER — SODIUM CHLORIDE 0.9% FLUSH
3.0000 mL | Freq: Two times a day (BID) | INTRAVENOUS | Status: DC
  Administered 2024-07-16 – 2024-07-17 (×4): 3 mL via INTRAVENOUS

## 2024-07-16 MED ORDER — SENNOSIDES-DOCUSATE SODIUM 8.6-50 MG PO TABS: 1.0000 | ORAL_TABLET | Freq: Every evening | ORAL | Status: DC | PRN

## 2024-07-16 MED ORDER — MONTELUKAST SODIUM 10 MG PO TABS
10.0000 mg | ORAL_TABLET | Freq: Every day | ORAL | Status: DC
  Administered 2024-07-16 (×2): 10 mg via ORAL
  Filled 2024-07-16 (×2): qty 1

## 2024-07-16 MED ORDER — DONEPEZIL HCL 10 MG PO TABS
10.0000 mg | ORAL_TABLET | Freq: Every day | ORAL | Status: DC
  Administered 2024-07-16 (×2): 10 mg via ORAL
  Filled 2024-07-16 (×2): qty 1

## 2024-07-16 MED ORDER — METHOCARBAMOL 500 MG PO TABS
500.0000 mg | ORAL_TABLET | Freq: Four times a day (QID) | ORAL | Status: DC | PRN
  Administered 2024-07-16 (×2): 500 mg via ORAL
  Filled 2024-07-16: qty 1

## 2024-07-16 NOTE — Progress Notes (Signed)
  Echocardiogram 2D Echocardiogram has been performed.  Marylu Dudenhoeffer 07/16/2024, 3:28 PM

## 2024-07-16 NOTE — H&P (Signed)
 SABRA

## 2024-07-16 NOTE — Plan of Care (Signed)

## 2024-07-16 NOTE — Hospital Course (Signed)
 68 y.o. female with medical history significant for depression, anxiety, fibromyalgia, memory loss, paroxysmal SVT, hypertension, and history of TIA who presents with lightheadedness, falls, generalized weakness, and syncope.    Assessment and Plan:   Syncope/orthostatic hypotension - Mildly hypotensive on presentation with noted orthostasis.  Most suggestive of orthostatic syncope.  Showing improvement with IV fluid hydration.  Likely exacerbated by beta-blockers and volume depletion.  Will observe on telemetry.  Echo pending.   Hypertension - Continue home medication regimen.   Paroxysmal SVT - Beta-blocker on board.   History of TIA - Aspirin  on board.   Depression/anxiety/fibromyalgia - Continue Cymbalta , Lexapro, Elavil, gabapentin.   Goals of care - Awaiting results of echocardiogram.  Anticipate discharge home later today if echo is normal.

## 2024-07-16 NOTE — Care Management Obs Status (Signed)
 MEDICARE OBSERVATION STATUS NOTIFICATION   Patient Details  Name: Kristin Pope MRN: [id number] Date of Birth: Jan 08, 1956   Medicare Observation Status Notification Given:  Yes    Jyquan Kenley G., RN 07/16/2024, 9:45 AM

## 2024-07-16 NOTE — Progress Notes (Signed)
 Progress Note   Patient: Kristin Pope FMW:[id number] DOB: [birth year] DOA: 07/15/2024  DOS: the patient was seen and examined on 07/16/2024   Brief hospital course:   68 y.o. female with medical history significant for depression, anxiety, fibromyalgia, memory loss, paroxysmal SVT, hypertension, and history of TIA who presents with lightheadedness, falls, generalized weakness, and syncope.   Assessment and Plan:  Syncope/orthostatic hypotension - Mildly hypotensive on presentation with noted orthostasis.  Most suggestive of orthostatic syncope.  Showing improvement with IV fluid hydration.  Likely exacerbated by beta-blockers and volume depletion.  Will observe on telemetry.  Echo pending.  Hypertension - Continue home medication regimen.  Paroxysmal SVT - Beta-blocker on board.  History of TIA - Aspirin  on board.  Depression/anxiety/fibromyalgia - Continue Cymbalta , Lexapro, Elavil, gabapentin.  Goals of care - Awaiting results of echocardiogram.  Anticipate discharge home later today if echo is normal.  Subjective: Patient resting comfortably this morning.  States she feels improved from yesterday.  Has been able to get up and move around without dizziness or falling.  Denies any fever, chills, chest pain, nausea, vomiting, abdominal pain, dizziness, syncope.  Eager for results of echo.  Physical Exam:  Vitals:   07/16/24 0010 07/16/24 0434 07/16/24 0829 07/16/24 1116  BP: 137/66 (!) 144/74 (!) 129/93 (!) 158/110  Pulse: 65 80 78 87  Resp: 18  18 18   Temp: 98.3 F (36.8 C) 98.5 F (36.9 C) 98.8 F (37.1 C) (!) 97 F (36.1 C)  TempSrc:  Oral    SpO2: 97% 95% 97% 97%  Weight:      Height:        GENERAL:  Alert, pleasant, no acute distress  HEENT:  EOMI CARDIOVASCULAR:  RRR, no murmurs appreciated RESPIRATORY:  Clear to auscultation, no wheezing, rales, or rhonchi GASTROINTESTINAL:  Soft, nontender, nondistended EXTREMITIES:  No LE edema bilaterally NEURO:  No  new focal deficits appreciated SKIN:  No rashes noted PSYCH:  Appropriate mood and affect     Data Reviewed:  Imaging Studies: CT ABDOMEN PELVIS WO CONTRAST Result Date: 07/15/2024 CLINICAL DATA:  Difficulty urinating, weakness EXAM: CT ABDOMEN AND PELVIS WITH CONTRAST TECHNIQUE: Multidetector CT imaging of the abdomen and pelvis was performed using the standard protocol following bolus administration of intravenous contrast. RADIATION DOSE REDUCTION: This exam was performed according to the departmental dose-optimization program which includes automated exposure control, adjustment of the mA and/or kV according to patient size and/or use of iterative reconstruction technique. COMPARISON:  September 06, 2010 FINDINGS: Lower chest: Small amount of scarring or discoid atelectasis at the left lung base Hepatobiliary: No liver lesions. No intrahepatic biliary dilatation. Gallbladder: Normal Pancreas: Normal Spleen: Normal Adrenal glands: Normal. Kidneys: The right kidney is larger than the left. No hydronephrosis. No stones identified. Stomach/duodenum: Normal Small & large bowel: Normal. There are sigmoid diverticula without evidence of diverticulitis. Vascular/Lymphatic: No significant vascular findings are present. No enlarged abdominal or pelvic lymph nodes. Pelvis: No abnormal mass, adenopathy or inflammatory process. The bladder is mildly distended. Other: None Musculoskeletal: There are degenerative changes. No compression fracture or bone lesion identified. Bilateral hip replacements. IMPRESSION: The bladder is moderately distended.  There is no hydronephrosis. No acute abnormality. Electronically Signed   By: Nancyann Burns M.D.   On: 07/15/2024 15:48   DG Chest Port 1 View Result Date: 07/15/2024 CLINICAL DATA:  Possible sepsis EXAM: PORTABLE CHEST 1 VIEW COMPARISON:  April 10, 2020 FINDINGS: The heart mediastinum are normal. There is some discoid opacity at the left  costophrenic angle which is  unchanged. Lungs are otherwise clear. No pleural disease. 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 IMPRESSION: No acute abnormality Electronically Signed   By: Nancyann Burns M.D.   On: 07/15/2024 15:36   CT Head Wo Contrast Result Date: 07/15/2024 CLINICAL DATA:  Difficulty urinating, syncope EXAM: CT HEAD WITHOUT CONTRAST TECHNIQUE: Contiguous axial images were obtained from the base of the skull through the vertex without intravenous contrast. RADIATION DOSE REDUCTION: This exam was performed according to the departmental dose-optimization program which includes automated exposure control, adjustment of the mA and/or kV according to patient size and/or use of iterative reconstruction technique. COMPARISON:  Brain MRI May 08, 2018, CT December 28, 2015 FINDINGS: CT HEAD: Attenuation in the brain parenchyma is normal. There is no hemorrhage. No acute ischemic changes. No mass lesion. The ventricles are normal. Skull/sinuses/orbits: No significant abnormality. IMPRESSION: Normal Electronically Signed   By: Nancyann Burns M.D.   On: 07/15/2024 15:35    Results are pending, will review when available.  Previous records (including but not limited to H&P, progress notes, nursing notes, TOC management) were reviewed in assessment of this patient.  Labs: CBC: Recent Labs  Lab 07/15/24 1400 07/15/24 1450 07/16/24 0727  WBC 9.8  --  6.4  NEUTROABS 7.5  --   --   HGB 10.3* 10.9* 10.0*  HCT 31.6* 32.0* 30.8*  MCV 97.2  --  98.1  PLT 301  --  316   Basic Metabolic Panel: Recent Labs  Lab 07/15/24 1400 07/15/24 1450 07/16/24 0727  NA 135 135 139  K 3.7 3.7 3.5  CL 101 102 105  CO2 22  --  21*  GLUCOSE 107* 103* 105*  BUN 11 12 6*  CREATININE 0.97 1.00 0.82  CALCIUM  8.7*  --  8.6*  MG  --   --  1.6*   Liver Function Tests: Recent Labs  Lab 07/15/24 1400  AST 15  ALT 10  ALKPHOS 66  BILITOT 0.6  PROT 6.2*  ALBUMIN 3.6   CBG: Recent Labs  Lab 07/16/24 0614 07/16/24 0831  GLUCAP 96  116*    Scheduled Meds:  amitriptyline  150 mg Oral QHS   aspirin  EC  81 mg Oral Daily   carvedilol   12.5 mg Oral BID   donepezil   10 mg Oral QHS   DULoxetine   60 mg Oral QHS   enoxaparin (LOVENOX) injection  40 mg Subcutaneous Q24H   escitalopram  10 mg Oral QPM   irbesartan  75 mg Oral Daily   montelukast   10 mg Oral QHS   pantoprazole   40 mg Oral QHS   sodium chloride  flush  3 mL Intravenous Q12H   Continuous Infusions: PRN Meds:.acetaminophen  **OR** acetaminophen , clonazePAM , gabapentin, methocarbamol , senna-docusate  Family Communication: None at bedside  Disposition: Status is: Observation The patient remains OBS appropriate and will d/c before 2 midnights.     Time spent: 35 minutes  Length of inpatient stay: 0 days  Author: Carliss LELON Canales, DO 07/16/2024 2:46 PM  For on call review [email or web address].

## 2024-07-17 DIAGNOSIS — F339 Major depressive disorder, recurrent, unspecified: Secondary | ICD-10-CM | POA: Diagnosis not present

## 2024-07-17 DIAGNOSIS — F419 Anxiety disorder, unspecified: Secondary | ICD-10-CM | POA: Diagnosis not present

## 2024-07-17 DIAGNOSIS — I951 Orthostatic hypotension: Secondary | ICD-10-CM | POA: Diagnosis not present

## 2024-07-17 LAB — CBC
HCT: 32.6 % — ABNORMAL LOW (ref 36.0–46.0)
Hemoglobin: 10.5 g/dL — ABNORMAL LOW (ref 12.0–15.0)
MCH: 31.5 pg (ref 26.0–34.0)
MCHC: 32.2 g/dL (ref 30.0–36.0)
MCV: 97.9 fL (ref 80.0–100.0)
Platelets: 346 K/uL (ref 150–400)
RBC: 3.33 MIL/uL — ABNORMAL LOW (ref 3.87–5.11)
RDW: 14.5 % (ref 11.5–15.5)
WBC: 7.8 K/uL (ref 4.0–10.5)
nRBC: 0 % (ref 0.0–0.2)

## 2024-07-17 LAB — BASIC METABOLIC PANEL WITH GFR
Anion gap: 13 (ref 5–15)
BUN: 9 mg/dL (ref 8–23)
CO2: 23 mmol/L (ref 22–32)
Calcium: 9.2 mg/dL (ref 8.9–10.3)
Chloride: 102 mmol/L (ref 98–111)
Creatinine, Ser: 0.96 mg/dL (ref 0.44–1.00)
GFR, Estimated: >60 mL/min (ref >60)
Glucose, Bld: 116 mg/dL — ABNORMAL HIGH (ref 70–99)
Potassium: 3.5 mmol/L (ref 3.5–5.1)
Sodium: 138 mmol/L (ref 135–145)

## 2024-07-17 NOTE — Progress Notes (Signed)
 Discharge Nurse Summary: DC order noted per MD. DC RN at bedside with patient. Patient agreeable with discharge plan, agreeable to transport to dc lounge to await family pickup. AVS printed/reviewed. PIVs removed. Telemonitor returned to charging station. No DME/home/TOC meds. CP/Edu resolved. Patient dressed and ready to go. All belongings accounted for. Patient wheeled patient downstairs to [facility] [employer].  Rosario EMERSON Lund, RN

## 2024-07-17 NOTE — Discharge Summary (Signed)
 Physician Discharge Summary   Patient: Kristin Pope MRN: [id number] DOB: March 06, 1956  Admit date:     07/15/2024  Discharge date: 07/17/24  Discharge Physician: Carliss LELON Canales   PCP: Darilyn Rosalva Bruckner, PA-C   Recommendations at discharge:    Pt to be discharged home.   If you experience worsening fever, chills, chest pain, shortness of breath, or other concerning symptoms, please call your PCP or go to the emergency department immediately.  Discharge Diagnoses: Principal Problem:   Syncope Active Problems:   Essential hypertension   Depression, major, recurrent   OSA (obstructive sleep apnea)   Chronic pain   TIA (transient ischemic attack)   Memory difficulty   Paroxysmal SVT (supraventricular tachycardia)   Anxiety  Resolved Problems:   * No resolved hospital problems. *   Hospital Course:  68 y.o. female with medical history significant for depression, anxiety, fibromyalgia, memory loss, paroxysmal SVT, hypertension, and history of TIA who presents with lightheadedness, falls, generalized weakness, and syncope.    Assessment and Plan:   Syncope/orthostatic hypotension - Mildly hypotensive on presentation with noted orthostasis.  Most suggestive of orthostatic syncope.  Showing improvement with IV fluid hydration.  Likely exacerbated by beta-blockers and volume depletion.  Echo normal.  Symptoms appear to be resolved.  Patient okay to be discharged home.  Hypertension - Continue home medication regimen.   Paroxysmal SVT - Beta-blocker on board.   History of TIA - Aspirin  on board.   Depression/anxiety/fibromyalgia - Continue Cymbalta , Lexapro, Elavil, gabapentin.   Polypharmacy - Likely component of patient's symptoms.  Patient has sedative type medication including muscle relaxers, benzos, that can exacerbate dizziness and weakness.  Instructed patient to be wary of taking sedating type medications and perhaps cut back on some of her other medications.   Will need to work closely with primary care provider to wean medication regiment where appropriate.   Consultants: None Procedures performed: None Disposition: Home Diet recommendation:  Discharge Diet Orders (From admission, onward)     Start     Ordered   07/17/24 0000  Diet - low sodium heart healthy        07/17/24 1114           Cardiac diet  DISCHARGE MEDICATION: Allergies as of 07/17/2024       Reactions   Morphine Other (See Comments)   Raging headaches   Tetracycline Nausea And Vomiting   Tramadol Other (See Comments)   Passed out  - dizziness   Morphine And Codeine Rash   Headaches - pt can take hydromorphone    Aminoglutethimide Other (See Comments)   Reaction not recalled   Atorvastatin Other (See Comments)   Pain, myalgias   Buprenorphine Hcl Other (See Comments)   Patient does not recognize this   Fentanyl  Nausea And Vomiting   Fentanyl  patch - nausea and vomiting.   Gluten Meal Other (See Comments)   Pt avoids eating gluten   Griseofulvin Other (See Comments)   Reaction not recalled   Hydrocodone  Itching, Other (See Comments)   Urinary retention, too --  can only take 1/2 tablet low dose also   Hydrocodone -acetaminophen  Other (See Comments)   Hallucinations   Lactose Intolerance (gi) Other (See Comments)   Upset stomach   Monosodium Glutamate Other (See Comments), Hypertension   MSG  --- Increases blood pressure   Penicillins Other (See Comments)   Reaction not recalled   Pregabalin Other (See Comments)   Legs felt like they were going to burst  Statins Other (See Comments)   Myalgia Product containing 3-hydroxy-3-methylglutaryl-coenzyme A reductase inhibitor (product)   Codeine Rash   REACTION: Insomnia crazy dreams        Medication List     TAKE these medications    amitriptyline 150 MG tablet Commonly known as: ELAVIL Take 150 mg by mouth at bedtime.   Armodafinil  150 MG tablet Take 225 mg by mouth daily as needed  (to boost energy).   aspirin  EC 81 MG tablet Take 81 mg by mouth daily.   carvedilol  12.5 MG tablet Commonly known as: COREG  Take 1 tablet (12.5 mg total) by mouth 2 (two) times daily.   celecoxib  200 MG capsule Commonly known as: CELEBREX  Take 200 mg by mouth in the morning and at bedtime.   clonazePAM  1 MG tablet Commonly known as: KLONOPIN  Take 0.5-1 mg by mouth See admin instructions. Take EITHER 0.5 mg by mouth EIGHT times a day OR 1 mg FOUR times a day - SCHEDULED   cyanocobalamin  100 MCG tablet Take 100 mcg by mouth daily.   donepezil  10 MG tablet Commonly known as: ARICEPT  TAKE 1 TABLET BY MOUTH EVERYDAY AT BEDTIME What changed: See the new instructions.   DULoxetine  60 MG capsule Commonly known as: CYMBALTA  Take 60 mg by mouth at bedtime.   escitalopram 10 MG tablet Commonly known as: LEXAPRO Take 10 mg by mouth at bedtime.   Eszopiclone 3 MG Tabs Take 3 mg by mouth at bedtime as needed (for sleep). Take immediately before bedtime   ezetimibe  10 MG tablet Commonly known as: ZETIA  Take 1 tablet (10 mg total) by mouth daily.   furosemide  20 MG tablet Commonly known as: LASIX  TAKE 1 TABLET BY MOUTH AS NEEDED What changed:  when to take this reasons to take this   gabapentin 600 MG tablet Commonly known as: NEURONTIN Take 300 mg by mouth in the morning and at bedtime.   lidocaine  4 % Place 1 patch onto the skin daily as needed (for pain).   Lidocaine -Menthol  (Spray) 4-1 % Liqd Apply 1 spray topically 3 (three) times daily as needed (for pain).   lubiprostone  24 MCG capsule Commonly known as: AMITIZA  Take 24 mcg by mouth 2 (two) times daily as needed for constipation.   methocarbamol  500 MG tablet Commonly known as: ROBAXIN  Take 500 mg by mouth 4 (four) times daily.   montelukast  10 MG tablet Commonly known as: SINGULAIR  Take 10 mg by mouth at bedtime.   nitrofurantoin  (macrocrystal-monohydrate) 100 MG capsule Commonly known as: MACROBID  Take  100 mg by mouth daily as needed (as directed for UTI infections).   nitroGLYCERIN  0.4 MG SL tablet Commonly known as: NITROSTAT  Place 0.4 mg under the tongue every 5 (five) minutes as needed for chest pain.   nitroGLYCERIN  0.4 MG SL tablet Commonly known as: NITROSTAT  Place 1 tablet (0.4 mg total) under the tongue every 5 (five) minutes as needed.   omeprazole  40 MG capsule Commonly known as: PRILOSEC Take 40 mg by mouth at bedtime as needed (for reflux).   ondansetron  4 MG disintegrating tablet Commonly known as: ZOFRAN -ODT Take 1 tablet (4 mg total) by mouth every 8 (eight) hours as needed for up to 10 doses for nausea or vomiting. What changed: reasons to take this   pravastatin 80 MG tablet Commonly known as: PRAVACHOL Take 80 mg by mouth at bedtime.   Preparation H 0.25-88.44 % suppository Generic drug: shark liver oil-cocoa butter Place 1 suppository rectally as needed for hemorrhoids.  telmisartan  40 MG tablet Commonly known as: MICARDIS  Take 0.5 tablets (20 mg total) by mouth in the morning and at bedtime.   tiZANidine  4 MG tablet Commonly known as: ZANAFLEX  Take 2-4 mg by mouth See admin instructions. Take EITHER 2 mg by mouth EIGHT times a day OR 4 mg FOUR times a day - SCHEDULED   traMADol 50 MG tablet Commonly known as: ULTRAM Take 50 mg by mouth every 8 (eight) hours as needed for moderate pain (pain score 4-6).   TYLENOL  500 MG tablet Generic drug: acetaminophen  Take 500-1,000 mg by mouth every 8 (eight) hours as needed for mild pain (pain score 1-3) (or headaches).   Vitamin D3 1000 units Caps Take 1,000 Units by mouth daily.         Discharge Exam: Filed Weights   07/15/24 1400  Weight: 74.8 kg    GENERAL:  Alert, pleasant, no acute distress  HEENT:  EOMI CARDIOVASCULAR:  RRR, no murmurs appreciated RESPIRATORY:  Clear to auscultation, no wheezing, rales, or rhonchi GASTROINTESTINAL:  Soft, nontender, nondistended EXTREMITIES:  No LE  edema bilaterally NEURO:  No new focal deficits appreciated SKIN:  No rashes noted PSYCH:  Appropriate mood and affect.   Condition at discharge: improving  The results of significant diagnostics from this hospitalization (including imaging, microbiology, ancillary and laboratory) are listed below for reference.   Imaging Studies: ECHOCARDIOGRAM COMPLETE Result Date: 07/16/2024    ECHOCARDIOGRAM REPORT   Patient Name:   FIZZA SCALES Date of Exam: 07/16/2024 Medical Rec #:  [id number]     Height:       57.0 in Accession #:    [id number]    Weight:       165.0 lb Date of Birth:  Feb 08, 1956     BSA:          1.657 m Patient Age:    68 years      BP:           158/110 mmHg Patient Gender: F             HR:           78 bpm. Exam Location:  Inpatient Procedure: 2D Echo (Both Spectral and Color Flow Doppler were utilized during            procedure). Indications:    syncope  History:        Patient has prior history of Echocardiogram examinations, most                 recent 07/11/2021. Risk Factors:Sleep Apnea, Hypertension and                 Dyslipidemia.  Sonographer:    Tinnie Barefoot RDCS Referring Phys: [id number] TIMOTHY S OPYD IMPRESSIONS  1. Left ventricular ejection fraction, by estimation, is 60 to 65%. The left ventricle has normal function. The left ventricle has no regional wall motion abnormalities. Left ventricular diastolic parameters were normal.  2. Right ventricular systolic function is normal. The right ventricular size is normal. There is normal pulmonary artery systolic pressure.  3. The mitral valve is normal in structure. Trivial mitral valve regurgitation. No evidence of mitral stenosis.  4. The tricuspid valve is abnormal.  5. The aortic valve is tricuspid. Aortic valve regurgitation is not visualized. No aortic stenosis is present.  6. The inferior vena cava is normal in size with greater than 50% respiratory variability, suggesting right atrial pressure of 3 mmHg. FINDINGS  Left  Ventricle: Left  ventricular ejection fraction, by estimation, is 60 to 65%. The left ventricle has normal function. The left ventricle has no regional wall motion abnormalities. The left ventricular internal cavity size was normal in size. There is  no left ventricular hypertrophy. Left ventricular diastolic parameters were normal. Right Ventricle: The right ventricular size is normal. Right vetricular wall thickness was not well visualized. Right ventricular systolic function is normal. There is normal pulmonary artery systolic pressure. The tricuspid regurgitant velocity is 2.69 m/s, and with an assumed right atrial pressure of 3 mmHg, the estimated right ventricular systolic pressure is 31.9 mmHg. Left Atrium: Left atrial size was normal in size. Right Atrium: Right atrial size was normal in size. Pericardium: There is no evidence of pericardial effusion. Mitral Valve: The mitral valve is normal in structure. Trivial mitral valve regurgitation. No evidence of mitral valve stenosis. Tricuspid Valve: The tricuspid valve is abnormal. Tricuspid valve regurgitation is mild . No evidence of tricuspid stenosis. Aortic Valve: The aortic valve is tricuspid. Aortic valve regurgitation is not visualized. No aortic stenosis is present. Aortic valve mean gradient measures 3.6 mmHg. Aortic valve peak gradient measures 6.3 mmHg. Aortic valve area, by VTI measures 1.56 cm. Pulmonic Valve: The pulmonic valve was not well visualized. Pulmonic valve regurgitation is not visualized. No evidence of pulmonic stenosis. Aorta: The aortic root and ascending aorta are structurally normal, with no evidence of dilitation. Venous: The inferior vena cava is normal in size with greater than 50% respiratory variability, suggesting right atrial pressure of 3 mmHg. IAS/Shunts: No atrial level shunt detected by color flow Doppler.  LEFT VENTRICLE PLAX 2D LVIDd:         5.00 cm   Diastology LVIDs:         2.80 cm   LV e' medial:    8.49 cm/s LV  PW:         0.90 cm   LV E/e' medial:  7.7 LV IVS:        1.00 cm   LV e' lateral:   11.10 cm/s LVOT diam:     1.70 cm   LV E/e' lateral: 5.9 LV SV:         42 LV SV Index:   26 LVOT Area:     2.27 cm  RIGHT VENTRICLE            IVC RV Basal diam:  2.40 cm    IVC diam: 1.90 cm RV S prime:     9.90 cm/s TAPSE (M-mode): 2.0 cm LEFT ATRIUM             Index        RIGHT ATRIUM           Index LA diam:        3.50 cm 2.11 cm/m   RA Area:     10.50 cm LA Vol (A2C):   44.7 ml 26.97 ml/m  RA Volume:   23.00 ml  13.88 ml/m LA Vol (A4C):   63.5 ml 38.31 ml/m LA Biplane Vol: 53.8 ml 32.46 ml/m  AORTIC VALVE AV Area (Vmax):    1.68 cm AV Area (Vmean):   1.59 cm AV Area (VTI):     1.56 cm AV Vmax:           125.86 cm/s AV Vmean:          90.848 cm/s AV VTI:            0.272 m AV Peak Grad:  6.3 mmHg AV Mean Grad:      3.6 mmHg LVOT Vmax:         93.40 cm/s LVOT Vmean:        63.600 cm/s LVOT VTI:          0.187 m LVOT/AV VTI ratio: 0.69  AORTA Ao Root diam: 3.10 cm Ao Asc diam:  3.10 cm MITRAL VALVE               TRICUSPID VALVE MV Area (PHT): 3.37 cm    TR Peak grad:   28.9 mmHg MV Decel Time: 225 msec    TR Vmax:        269.00 cm/s MR Peak grad: 115.8 mmHg MR Vmax:      538.00 cm/s  SHUNTS MV E velocity: 65.60 cm/s  Systemic VTI:  0.19 m MV A velocity: 59.50 cm/s  Systemic Diam: 1.70 cm MV E/A ratio:  1.10 Dorn Ross MD Electronically signed by Dorn Ross MD Signature Date/Time: 07/16/2024/3:30:38 PM    Final    CT ABDOMEN PELVIS WO CONTRAST Result Date: 07/15/2024 CLINICAL DATA:  Difficulty urinating, weakness EXAM: CT ABDOMEN AND PELVIS WITH CONTRAST TECHNIQUE: Multidetector CT imaging of the abdomen and pelvis was performed using the standard protocol following bolus administration of intravenous contrast. RADIATION DOSE REDUCTION: This exam was performed according to the departmental dose-optimization program which includes automated exposure control, adjustment of the mA and/or kV according  to patient size and/or use of iterative reconstruction technique. COMPARISON:  September 06, 2010 FINDINGS: Lower chest: Small amount of scarring or discoid atelectasis at the left lung base Hepatobiliary: No liver lesions. No intrahepatic biliary dilatation. Gallbladder: Normal Pancreas: Normal Spleen: Normal Adrenal glands: Normal. Kidneys: The right kidney is larger than the left. No hydronephrosis. No stones identified. Stomach/duodenum: Normal Small & large bowel: Normal. There are sigmoid diverticula without evidence of diverticulitis. Vascular/Lymphatic: No significant vascular findings are present. No enlarged abdominal or pelvic lymph nodes. Pelvis: No abnormal mass, adenopathy or inflammatory process. The bladder is mildly distended. Other: None Musculoskeletal: There are degenerative changes. No compression fracture or bone lesion identified. Bilateral hip replacements. IMPRESSION: The bladder is moderately distended.  There is no hydronephrosis. No acute abnormality. Electronically Signed   By: Nancyann Burns M.D.   On: 07/15/2024 15:48   DG Chest Port 1 View Result Date: 07/15/2024 CLINICAL DATA:  Possible sepsis EXAM: PORTABLE CHEST 1 VIEW COMPARISON:  April 10, 2020 FINDINGS: The heart mediastinum are normal. There is some discoid opacity at the left costophrenic angle which is unchanged. Lungs are otherwise clear. No pleural disease. 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 IMPRESSION: No acute abnormality Electronically Signed   By: Nancyann Burns M.D.   On: 07/15/2024 15:36   CT Head Wo Contrast Result Date: 07/15/2024 CLINICAL DATA:  Difficulty urinating, syncope EXAM: CT HEAD WITHOUT CONTRAST TECHNIQUE: Contiguous axial images were obtained from the base of the skull through the vertex without intravenous contrast. RADIATION DOSE REDUCTION: This exam was performed according to the departmental dose-optimization program which includes automated exposure control, adjustment of the mA and/or kV according  to patient size and/or use of iterative reconstruction technique. COMPARISON:  Brain MRI May 08, 2018, CT December 28, 2015 FINDINGS: CT HEAD: Attenuation in the brain parenchyma is normal. There is no hemorrhage. No acute ischemic changes. No mass lesion. The ventricles are normal. Skull/sinuses/orbits: No significant abnormality. IMPRESSION: Normal Electronically Signed   By: Nancyann Burns M.D.   On: 07/15/2024 15:35  Microbiology: Results for orders placed or performed during the hospital encounter of 07/15/24  Blood Culture (routine x 2)     Status: None (Preliminary result)   Collection Time: 07/15/24  2:30 PM   Specimen: BLOOD  Result Value Ref Range Status   Specimen Description BLOOD RIGHT ANTECUBITAL  Final   Special Requests   Final    BOTTLES DRAWN AEROBIC AND ANAEROBIC Blood Culture results may not be optimal due to an inadequate volume of blood received in culture bottles   Culture   Final    NO GROWTH 2 DAYS Performed at [facility], [address] [place], [place] [address]    Report Status PENDING  Incomplete  Blood Culture (routine x 2)     Status: None (Preliminary result)   Collection Time: 07/15/24  3:22 PM   Specimen: BLOOD  Result Value Ref Range Status   Specimen Description BLOOD LEFT ANTECUBITAL  Final   Special Requests   Final    BOTTLES DRAWN AEROBIC AND ANAEROBIC Blood Culture adequate volume   Culture   Final    NO GROWTH 2 DAYS Performed at [facility], [address] [place], [place] [address]    Report Status PENDING  Incomplete    Labs: CBC: Recent Labs  Lab 07/15/24 1400 07/15/24 1450 07/16/24 0727 07/17/24 0530  WBC 9.8  --  6.4 7.8  NEUTROABS 7.5  --   --   --   HGB 10.3* 10.9* 10.0* 10.5*  HCT 31.6* 32.0* 30.8* 32.6*  MCV 97.2  --  98.1 97.9  PLT 301  --  316 346   Basic Metabolic Panel: Recent Labs  Lab 07/15/24 1400 07/15/24 1450 07/16/24 0727 07/17/24 0530  NA 135 135 139 138  K 3.7 3.7 3.5 3.5  CL 101  102 105 102  CO2 22  --  21* 23  GLUCOSE 107* 103* 105* 116*  BUN 11 12 6* 9  CREATININE 0.97 1.00 0.82 0.96  CALCIUM  8.7*  --  8.6* 9.2  MG  --   --  1.6*  --    Liver Function Tests: Recent Labs  Lab 07/15/24 1400  AST 15  ALT 10  ALKPHOS 66  BILITOT 0.6  PROT 6.2*  ALBUMIN 3.6   CBG: Recent Labs  Lab 07/16/24 0614 07/16/24 0831 07/16/24 1610  GLUCAP 96 116* 134*    Discharge time spent: 27 minutes.  Length of inpatient stay: 0 days  Signed: Carliss LELON Canales, DO [facility] 07/17/2024

## 2024-07-17 NOTE — Plan of Care (Signed)
  Problem: Education: Goal: Knowledge of General Education information will improve Description: Including pain rating scale, medication(s)/side effects and non-pharmacologic comfort measures Outcome: Adequate for Discharge   Problem: Health Behavior/Discharge Planning: Goal: Ability to manage health-related needs will improve Outcome: Adequate for Discharge   Problem: Clinical Measurements: Goal: Ability to maintain clinical measurements within normal limits will improve Outcome: Adequate for Discharge Goal: Will remain free from infection Outcome: Adequate for Discharge Goal: Diagnostic test results will improve Outcome: Adequate for Discharge Goal: Respiratory complications will improve Outcome: Adequate for Discharge Goal: Cardiovascular complication will be avoided Outcome: Adequate for Discharge   Problem: Activity: Goal: Risk for activity intolerance will decrease Outcome: Adequate for Discharge   Problem: Nutrition: Goal: Adequate nutrition will be maintained Outcome: Adequate for Discharge   Problem: Coping: Goal: Level of anxiety will decrease Outcome: Adequate for Discharge   Problem: Elimination: Goal: Will not experience complications related to bowel motility Outcome: Adequate for Discharge Goal: Will not experience complications related to urinary retention Outcome: Adequate for Discharge   Problem: Pain Managment: Goal: General experience of comfort will improve and/or be controlled Outcome: Adequate for Discharge   Problem: Safety: Goal: Ability to remain free from injury will improve Outcome: Adequate for Discharge   Problem: Skin Integrity: Goal: Risk for impaired skin integrity will decrease Outcome: Adequate for Discharge   Problem: Education: Goal: Knowledge of condition and prescribed therapy will improve Outcome: Adequate for Discharge   Problem: Cardiac: Goal: Will achieve and/or maintain adequate cardiac output Outcome: Adequate for  Discharge   Problem: Physical Regulation: Goal: Complications related to the disease process, condition or treatment will be avoided or minimized Outcome: Adequate for Discharge

## 2024-07-17 NOTE — Plan of Care (Signed)
 Problem: Education: Goal: Knowledge of General Education information will improve Description: Including pain rating scale, medication(s)/side effects and non-pharmacologic comfort measures 07/17/2024 1129 by Joshua Zorita CROME, RN Outcome: Adequate for Discharge 07/17/2024 1129 by Joshua Zorita CROME, RN Outcome: Adequate for Discharge   Problem: Health Behavior/Discharge Planning: Goal: Ability to manage health-related needs will improve 07/17/2024 1129 by Joshua Zorita CROME, RN Outcome: Adequate for Discharge 07/17/2024 1129 by Joshua Zorita CROME, RN Outcome: Adequate for Discharge   Problem: Clinical Measurements: Goal: Ability to maintain clinical measurements within normal limits will improve 07/17/2024 1129 by Joshua Zorita CROME, RN Outcome: Adequate for Discharge 07/17/2024 1129 by Joshua Zorita CROME, RN Outcome: Adequate for Discharge Goal: Will remain free from infection 07/17/2024 1129 by Joshua Zorita CROME, RN Outcome: Adequate for Discharge 07/17/2024 1129 by Joshua Zorita CROME, RN Outcome: Adequate for Discharge Goal: Diagnostic test results will improve 07/17/2024 1129 by Joshua Zorita CROME, RN Outcome: Adequate for Discharge 07/17/2024 1129 by Joshua Zorita CROME, RN Outcome: Adequate for Discharge Goal: Respiratory complications will improve 07/17/2024 1129 by Joshua Zorita CROME, RN Outcome: Adequate for Discharge 07/17/2024 1129 by Joshua Zorita CROME, RN Outcome: Adequate for Discharge Goal: Cardiovascular complication will be avoided 07/17/2024 1129 by Joshua Zorita CROME, RN Outcome: Adequate for Discharge 07/17/2024 1129 by Joshua Zorita CROME, RN Outcome: Adequate for Discharge   Problem: Activity: Goal: Risk for activity intolerance will decrease 07/17/2024 1129 by Joshua Zorita CROME, RN Outcome: Adequate for Discharge 07/17/2024 1129 by Joshua Zorita CROME, RN Outcome: Adequate for Discharge   Problem: Nutrition: Goal: Adequate nutrition will be maintained 07/17/2024 1129 by Joshua Zorita CROME, RN Outcome:  Adequate for Discharge 07/17/2024 1129 by Joshua Zorita CROME, RN Outcome: Adequate for Discharge   Problem: Coping: Goal: Level of anxiety will decrease 07/17/2024 1129 by Joshua Zorita CROME, RN Outcome: Adequate for Discharge 07/17/2024 1129 by Joshua Zorita CROME, RN Outcome: Adequate for Discharge   Problem: Elimination: Goal: Will not experience complications related to bowel motility 07/17/2024 1129 by Joshua Zorita CROME, RN Outcome: Adequate for Discharge 07/17/2024 1129 by Joshua Zorita CROME, RN Outcome: Adequate for Discharge Goal: Will not experience complications related to urinary retention 07/17/2024 1129 by Joshua Zorita CROME, RN Outcome: Adequate for Discharge 07/17/2024 1129 by Joshua Zorita CROME, RN Outcome: Adequate for Discharge   Problem: Pain Managment: Goal: General experience of comfort will improve and/or be controlled 07/17/2024 1129 by Joshua Zorita CROME, RN Outcome: Adequate for Discharge 07/17/2024 1129 by Joshua Zorita CROME, RN Outcome: Adequate for Discharge   Problem: Safety: Goal: Ability to remain free from injury will improve 07/17/2024 1129 by Joshua Zorita CROME, RN Outcome: Adequate for Discharge 07/17/2024 1129 by Joshua Zorita CROME, RN Outcome: Adequate for Discharge   Problem: Skin Integrity: Goal: Risk for impaired skin integrity will decrease 07/17/2024 1129 by Joshua Zorita CROME, RN Outcome: Adequate for Discharge 07/17/2024 1129 by Joshua Zorita CROME, RN Outcome: Adequate for Discharge   Problem: Education: Goal: Knowledge of condition and prescribed therapy will improve 07/17/2024 1129 by Joshua Zorita CROME, RN Outcome: Adequate for Discharge 07/17/2024 1129 by Joshua Zorita CROME, RN Outcome: Adequate for Discharge   Problem: Cardiac: Goal: Will achieve and/or maintain adequate cardiac output 07/17/2024 1129 by Joshua Zorita CROME, RN Outcome: Adequate for Discharge 07/17/2024 1129 by Joshua Zorita CROME, RN Outcome: Adequate for Discharge   Problem: Physical Regulation: Goal:  Complications related to the disease process, condition or treatment will be avoided or minimized 07/17/2024 1129 by Joshua Zorita CROME, RN Outcome: Adequate for Discharge  07/17/2024 1129 by Joshua Zorita CROME, RN Outcome: Adequate for Discharge

## 2024-07-20 LAB — CULTURE, BLOOD (ROUTINE X 2)
Culture: NO GROWTH
Culture: NO GROWTH
Special Requests: ADEQUATE

## 2024-08-23 ENCOUNTER — Other Ambulatory Visit: Payer: Self-pay | Admitting: Neurology
